# Patient Record
Sex: Male | Born: 1940 | Race: White | Hispanic: No
Health system: Southern US, Community
[De-identification: ages and names within clinical notes are randomized; demographics above are authoritative.]

## PROBLEM LIST (undated history)

## (undated) ENCOUNTER — Emergency Department (HOSPITAL_COMMUNITY): Payer: Medicare Other

## (undated) DIAGNOSIS — D649 Anemia, unspecified: Secondary | ICD-10-CM

## (undated) DIAGNOSIS — K759 Inflammatory liver disease, unspecified: Secondary | ICD-10-CM

## (undated) DIAGNOSIS — Z923 Personal history of irradiation: Secondary | ICD-10-CM

## (undated) DIAGNOSIS — J189 Pneumonia, unspecified organism: Secondary | ICD-10-CM

## (undated) DIAGNOSIS — C419 Malignant neoplasm of bone and articular cartilage, unspecified: Secondary | ICD-10-CM

## (undated) DIAGNOSIS — I2510 Atherosclerotic heart disease of native coronary artery without angina pectoris: Secondary | ICD-10-CM

## (undated) DIAGNOSIS — N289 Disorder of kidney and ureter, unspecified: Secondary | ICD-10-CM

## (undated) DIAGNOSIS — I10 Essential (primary) hypertension: Secondary | ICD-10-CM

## (undated) DIAGNOSIS — C801 Malignant (primary) neoplasm, unspecified: Secondary | ICD-10-CM

## (undated) DIAGNOSIS — I447 Left bundle-branch block, unspecified: Secondary | ICD-10-CM

## (undated) DIAGNOSIS — T8859XA Other complications of anesthesia, initial encounter: Secondary | ICD-10-CM

## (undated) DIAGNOSIS — E7800 Pure hypercholesterolemia, unspecified: Secondary | ICD-10-CM

## (undated) DIAGNOSIS — I4891 Unspecified atrial fibrillation: Secondary | ICD-10-CM

## (undated) DIAGNOSIS — IMO0001 Reserved for inherently not codable concepts without codable children: Secondary | ICD-10-CM

## (undated) DIAGNOSIS — I509 Heart failure, unspecified: Secondary | ICD-10-CM

## (undated) DIAGNOSIS — I480 Paroxysmal atrial fibrillation: Secondary | ICD-10-CM

## (undated) DIAGNOSIS — G4730 Sleep apnea, unspecified: Secondary | ICD-10-CM

## (undated) DIAGNOSIS — T4145XA Adverse effect of unspecified anesthetic, initial encounter: Secondary | ICD-10-CM

## (undated) DIAGNOSIS — J449 Chronic obstructive pulmonary disease, unspecified: Secondary | ICD-10-CM

## (undated) HISTORY — PX: OTHER SURGICAL HISTORY: SHX169

## (undated) HISTORY — PX: CORONARY ANGIOPLASTY WITH STENT PLACEMENT: SHX49

## (undated) HISTORY — PX: BACK SURGERY: SHX140

---

## 2007-04-26 ENCOUNTER — Inpatient Hospital Stay (HOSPITAL_COMMUNITY): Admission: EM | Admit: 2007-04-26 | Discharge: 2007-04-30 | Payer: Self-pay | Admitting: Emergency Medicine

## 2007-04-26 ENCOUNTER — Ambulatory Visit: Payer: Self-pay | Admitting: Internal Medicine

## 2007-12-29 ENCOUNTER — Ambulatory Visit (HOSPITAL_COMMUNITY): Admission: RE | Admit: 2007-12-29 | Discharge: 2007-12-29 | Payer: Self-pay | Admitting: Family Medicine

## 2008-03-22 ENCOUNTER — Emergency Department (HOSPITAL_COMMUNITY): Admission: EM | Admit: 2008-03-22 | Discharge: 2008-03-23 | Payer: Self-pay | Admitting: Emergency Medicine

## 2008-03-27 ENCOUNTER — Emergency Department (HOSPITAL_COMMUNITY): Admission: EM | Admit: 2008-03-27 | Discharge: 2008-03-27 | Payer: Self-pay | Admitting: Emergency Medicine

## 2008-04-16 ENCOUNTER — Inpatient Hospital Stay (HOSPITAL_COMMUNITY): Admission: EM | Admit: 2008-04-16 | Discharge: 2008-04-23 | Payer: Self-pay | Admitting: Emergency Medicine

## 2008-04-29 ENCOUNTER — Ambulatory Visit (HOSPITAL_COMMUNITY): Admission: RE | Admit: 2008-04-29 | Discharge: 2008-04-29 | Payer: Self-pay | Admitting: Family Medicine

## 2008-06-10 DIAGNOSIS — C801 Malignant (primary) neoplasm, unspecified: Secondary | ICD-10-CM

## 2008-06-10 HISTORY — PX: NEPHRECTOMY: SHX65

## 2008-06-10 HISTORY — DX: Malignant (primary) neoplasm, unspecified: C80.1

## 2008-09-28 ENCOUNTER — Ambulatory Visit (HOSPITAL_COMMUNITY): Admission: RE | Admit: 2008-09-28 | Discharge: 2008-09-28 | Payer: Self-pay | Admitting: Neurology

## 2008-10-11 ENCOUNTER — Emergency Department (HOSPITAL_COMMUNITY): Admission: EM | Admit: 2008-10-11 | Discharge: 2008-10-11 | Payer: Self-pay | Admitting: Emergency Medicine

## 2010-02-24 ENCOUNTER — Emergency Department (HOSPITAL_COMMUNITY): Admission: EM | Admit: 2010-02-24 | Discharge: 2010-02-24 | Payer: Self-pay | Admitting: Emergency Medicine

## 2010-08-23 LAB — DIFFERENTIAL
Basophils Absolute: 0 10*3/uL (ref 0.0–0.1)
Basophils Relative: 0 % (ref 0–1)
Eosinophils Absolute: 0 10*3/uL (ref 0.0–0.7)
Eosinophils Relative: 0 % (ref 0–5)
Lymphs Abs: 0.8 10*3/uL (ref 0.7–4.0)
Monocytes Absolute: 0.8 10*3/uL (ref 0.1–1.0)
Monocytes Relative: 9 % (ref 3–12)
Neutro Abs: 7.6 10*3/uL (ref 1.7–7.7)
Neutrophils Relative %: 82 % — ABNORMAL HIGH (ref 43–77)

## 2010-08-23 LAB — URINALYSIS, ROUTINE W REFLEX MICROSCOPIC
Glucose, UA: NEGATIVE mg/dL
Ketones, ur: 15 mg/dL — AB
Nitrite: NEGATIVE
Specific Gravity, Urine: >1.03 — ABNORMAL HIGH (ref 1.005–1.030)
Urobilinogen, UA: 1 mg/dL (ref 0.0–1.0)
pH: 5.5 (ref 5.0–8.0)

## 2010-08-23 LAB — COMPREHENSIVE METABOLIC PANEL
ALT: 15 U/L (ref 0–53)
CO2: 26 mEq/L (ref 19–32)
Calcium: 8.7 mg/dL (ref 8.4–10.5)
Creatinine, Ser: 1.75 mg/dL — ABNORMAL HIGH (ref 0.4–1.5)
GFR calc Af Amer: 47 mL/min — ABNORMAL LOW (ref >60)
Glucose, Bld: 209 mg/dL — ABNORMAL HIGH (ref 70–99)
Potassium: 4.4 mEq/L (ref 3.5–5.1)
Total Bilirubin: 1.2 mg/dL (ref 0.3–1.2)

## 2010-08-23 LAB — URINE MICROSCOPIC-ADD ON

## 2010-08-23 LAB — CBC: HCT: 39.5 % (ref 39.0–52.0)

## 2010-09-19 LAB — CREATININE, SERUM: GFR calc Af Amer: 50 mL/min — ABNORMAL LOW (ref >60)

## 2010-10-23 NOTE — Discharge Summary (Signed)
NAMEBRADDEN, [name]                ACCOUNT NO.:  [id number]   MEDICAL RECORD NO.:  [id number]          PATIENT TYPE:  INP   LOCATION:  A208                          FACILITY:  [facility]   PHYSICIAN:  Marcello Moores, MD   DATE OF BIRTH:  September 26, 1940   DATE OF ADMISSION:  04/26/2007  DATE OF DISCHARGE:  LH                               DISCHARGE SUMMARY   UROLOGIST:  Dr. Rito Ehrlich.   DISCHARGE DIAGNOSES:  1. 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  2. Hematuria resolved.  3. Urinary tract infection resolved.  4. Benign prostatic hypertrophy.  5. Hypertension fairly controlled.  6. Diabetes mellitus controlled.  7. History of myocardial infarction status post probably PCI in 2007      at [place], [facility].  8. Hyperlipidemia.  8. Gastroesophageal reflux disease.   HOME MEDICATIONS:  1. Plavix 75 mg p.o. daily.  2. Atenolol 25 mg p.o. b.i.d.  3. Simvastatin 20 mg p.o. at bedtime.  4. Metformin 500 mg p.o. b.i.d.  5. Isosorbide mononitrate 30 mg p.o. b.i.d.  6. Methadone 10 mg p.o. p.r.n.  7. Glimepiride 6 mg p.o. daily.  8. Prilosec 30 mg p.o. daily.  9. Catapres once weekly.  10.Nitroglycerin patch b.i.d.   HOSPITAL COURSE:  He is a 70 year old male patient with the above  multiple medical problems.  He presented with chest pain and blood in  his urine, and he was evaluated by cardiologist for his chest pain and  MI was cleared.  He was cleared also for surgery and his hematuria  subsided, but CAT scan of the abdomen and pelvic revealed large probably  renal cell carcinoma on the right kidney and he was evaluated by  urologist, Dr. Rito Ehrlich.  As per the plan of Dr. Rito Ehrlich the patient  will go tomorrow to his office, and he will refer him to [facility] for possible surgery.  The patient agreed and he understood,  and tomorrow he will go to Dr. Chancy Milroy office.  Currently the patient  has not any bleeding, and he has not any chest pain,  no shortness of  breath.  He is stable to go home.   PHYSICAL EXAMINATION:  VITAL SIGNS:  Temperature 96, pulse 87,  respiratory rate 20, blood pressure 150/76, saturation 93% on room air.  HEENT:  Has pink conjunctivae, nonicteric sclera.  NECK:  Supple.  CHEST:  He has good air entry bilaterally.  CVS:  S1, S2 regular.  ABDOMEN:  Soft.  No area of tenderness.  EXTREMITIES:  No pedal edema.  CNS:  He is alert and fairly oriented.   LABORATORY DATA:  On the labs today hemoglobin 13, hematocrit 37.8  stable.   PLAN OF DISCHARGE:  The patient will be discharged today to follow up  tomorrow morning with Dr. Rito Ehrlich, urologist, to be referred to [facility] for further evaluation and possible surgery.  Echocardiogram  was done today, and it will be followed by the patient tomorrow morning  who will call to radiology for the results and he can discuss with Dr.  Rito Ehrlich also  about the results.      Marcello Moores, MD  Electronically Signed     MT/MEDQ  D:  04/30/2007  T:  05/01/2007  Job:  [id number]

## 2010-10-23 NOTE — H&P (Signed)
[name], [name]                ACCOUNT NO.:  [id number]   MEDICAL RECORD NO.:  [id number]          PATIENT TYPE:  INP   LOCATION:  IC06                          FACILITY:  [facility]   PHYSICIAN:  Edward L. Juanetta Gosling, M.D.DATE OF BIRTH:  02-Sep-1940   DATE OF ADMISSION:  04/16/2008  DATE OF DISCHARGE:  LH                              HISTORY & PHYSICAL   REASON FOR ADMISSION:  Hyperglycemia and acute renal failure.   HISTORY:  [name] is a 70 year old who came to the emergency because  of high blood sugar and near syncope.  He said that he was in his usual  state of fair health at home, but over the last month he felt bad with  fatigue, nausea, vomiting.  He says that his blood sugar has been high.  He has had trouble getting it under control and today he went to eat  something and almost passed out.   PAST MEDICAL HISTORY:  Positive for hypertension, diabetes and  myocardial infarction.   HOME MEDICATIONS:  1. Plavix 75 mg daily.  2. Atenolol 25 mg b.i.d.  3. Simvastatin 10 mg daily.  4. Metformin 500 mg b.i.d.  5. Isosorbide 30 mg b.i.d.  6. Methadone 10 mg daily.  7. Amaryl 2 mg 3 tablets daily.  8. Prilosec 20 mg daily.  9. Catapres-TTS 1 once a week.  10.Actos plus metformin 15/500 b.i.d.   ALLERGIES:  He is allergic to CODEINE.   SURGICAL HISTORY:  He has apparently had multiple percutaneous coronary  interventions in the past.   SOCIAL HISTORY:  He is a nonsmoker lifetime and does not use any drugs.  Apparently, previous history of alcohol use but stopped about 15 years  ago.   FAMILY HISTORY:  Positive for coronary artery disease, the extent of  which is not totally clear.   PHYSICAL EXAMINATION:  GENERAL:  He appears significantly weak.  VITAL SIGNS:  Blood pressure 92/64, pulse 85, respirations 18, and  temperature is 96.9.  HEENT:  His pupils are reactive.  His mucous membranes are dry.  His  nose and throat are clear.  NECK:  Supple without masses.  LUNGS:  I do not hear any wheezing.  HEART:  He does not have a gallop.  EXTREMITIES:  No edema.  CENTRAL NERVOUS SYSTEM:  Grossly intact.   LABORATORY DATA:  EKG shows a left bundle-branch block, left axis  deviation.  His sodium is 126, his BUN is 51, and creatinine 2.31.  Urine shows white cells, red cells and bacteria.  His white count 7200,  hemoglobin 16.4, platelets 243, and blood sugar 713.  The chest x-ray is  pending.   ASSESSMENT:  He has markedly elevated blood sugar.  He has what appears  to be renal failure.  BUN was 14 and creatinine 1.27 in his last  admission in November 2008.   PLAN:  I am going to go ahead and put him on the ICU due to  hypoglycemia, given some IV fluids and see what his renal function does.  We talked about going to get blood cultures, urine  culture, etc.  He has  not had a chest x-ray and he needs to have that.  He does not have any  evidence of myocardial infarction.  He does have a history of coronary  artery disease.  His EKG though shows left bundle-branch block so we  need to go ahead and check cardiac panel, etc.  I spent time talking to  them      Edward L. Juanetta Gosling, M.D.  Electronically Signed     ELH/MEDQ  D:  04/16/2008  T:  04/16/2008  Job:  [id number]

## 2010-10-23 NOTE — Discharge Summary (Signed)
[name], WYATT                ACCOUNT NO.:  [id number]   MEDICAL RECORD NO.:  [id number]          PATIENT TYPE:  INP   LOCATION:  A328                          FACILITY:  [facility]   PHYSICIAN:  Melvyn Novas, MDDATE OF BIRTH:  Jun 14, 1940   DATE OF ADMISSION:  04/16/2008  DATE OF DISCHARGE:  LH                               DISCHARGE SUMMARY   The patient is a 70 year old white male with history of coronary artery  bypass grafting, hypertension, hyperlipidemia, some learning  difficulties.  He is totally illiterate, also with diabetes and some  chronic pain issues.  The patient was admitted to the hospital with  polyuria and polydipsia, glucose in the 700s admitted to the ICU with  nonketotic hyperosmolar state, insulins and fluids were administered to  normalize his glycemic status.  He did well.  There was no acidosis.  There was no anginal chest pain, orthopnea, or PND.  He was instructed  on how to administer Lantus insulin at night alone, the patient seemed  to understand this and did well, and also glucometer readings, self-  administered.   DISCHARGE MEDICATIONS:  1. Lantus 40 mg h.s. daily.  2. Plavix 75 mg daily.  3. Atenolol 25 mg p.o. daily.  4. Zocor 10 mg per day.  5. Actos plus metformin 50/500 p.o. b.i.d.  6. Catapres-TTS 1 patch weekly.  7. Prilosec 20 mg per day.   He will follow up in the office in 4 days' time to ascertain that he is  handling insulin administration.      Melvyn Novas, MD  Electronically Signed     RMD/MEDQ  D:  04/22/2008  T:  04/23/2008  Job:  [id number]

## 2010-10-23 NOTE — H&P (Signed)
[name], [name]                ACCOUNT NO.:  [id number]   MEDICAL RECORD NO.:  [id number]          PATIENT TYPE:  INP   LOCATION:  A208                          FACILITY:  [facility]   PHYSICIAN:  Dorris Singh, DO    DATE OF BIRTH:  Jun 01, 1941   DATE OF ADMISSION:  04/26/2007  DATE OF DISCHARGE:  LH                              HISTORY & PHYSICAL   The patient is a 70 year old Caucasian male who presented to the  emergency room with Chief Complaint of chest pain and blood in his  urine.  He said he went to a farmer's market today and was having a hard  time urinating but also noted that it had changed in color x1 day's  duration and then noticed he was having some chest discomfort that  radiated to his neck and left arm.  He then came to the emergency room  to be evaluated.   PAST MEDICAL HISTORY:  Significant for:  1. Hypertension.  2. Diabetes.  3. Myocardial infarction.   PRIMARY CARE PHYSICIAN:  Unable to find that information at this point  in time, but will obtain it.   He is a nonsmoker, no drug abuse, former drinker.   ALLERGIES:  No known drug allergies.   CURRENT MEDICATIONS:  He is currently on several medications which  include:  1. Plavix 75 mg once a day.  2. Atenolol 25 mg twice a day.  3. Simvastatin 10 mg once a day.  4. Metformin 500 mg twice a day.  5. Isosorbide mononitrate 30 mg twice a day.  6. Methadone 10 mg as needed.  7. Prilosec once a day.  8. Catapres 0.2 subcutaneously patch once a week.  9. Glimepiride 6 mg daily.   REVIEW OF SYSTEMS:  GENERAL:  This is positive for weakness but negative  for fatigue or changes in weight.  Eyes, ears, nose, and throat are  negative for any changes.  CARDIOVASCULAR:  Positive for chest pain.  RESPIRATORY:  Negative for dyspnea or wheezing.  GASTROINTESTINAL:  Negative for nausea or vomiting, constipation, diarrhea.  GU:  Positive  for hematuria.  MUSCULOSKELETAL:  The patient states he has +1 body  sensation in right groin since last catheterization.  SKIN:  No pruritus  or ecchymosis.  There are no excoriations.  NEUROLOGIC:  No concerns.  Noncontributory.   PHYSICAL EXAMINATION:  VITAL SIGNS: Blood pressure 138/77, pulse 82,  respirations 18, pulse oximetry 97%.  GENERAL:  The patient is a 70 year old Caucasian male, well-developed,  well-nourished,  no acute distress.  HEENT:  Head is normocephalic and atraumatic.  Eyes are normal  appearance.  EOMI.  No scleral icterus or conjunctival injection.  Ears:  TMs visualized bilaterally.  Nose and throat have no erythema or exudate  noted.  NECK:  Supple.  No lymphadenopathy noted.  CARDIOVASCULAR:  Regular rate and rhythm.  No murmurs, rubs, or gallops.  LUNGS:  Clear to auscultation bilaterally.  No wheezes, rales,  rhonchi.  ABDOMEN:  Soft, nontender, round.  No rebound or rigidity noted.  EXTREMITIES:  Positive for full range of motion.  No tenderness, edema,  or ecchymosis noted.  SKIN:  Good turgor, good texture.  NEUROLOGIC:  Cranial nerves II-XII grossly intact.   Tests that were done:  One, she had a sublingual nitroglycerin while in  the ED and actually resolved.   EKG done with rate of 85, left axis deviation, left bundle branch block,  nonspecific ST and T wave changes.  There was no previous EKG for  comparison.   The patient also had a urine microscopic evaluation finding bacteria  many.  On macroscopic, he had positive nitrite, very low blood and  bilirubin, color brown,  appearance hazy.  Cardiac markers were  negative. BMP was negative except for elevated glucose of 125. CBC with  differential:  White count was 6.7, hemoglobin 14.6, hematocrit 42.0,  platelet count 212.   ASSESSMENT AND PLAN:  The patient was admitted with chest pain, urinary  tract infection, and hematuria.  He will be admitted to 2A.  We will  draw serial cardiac markers as well as CBC and BMP.  He will be placed  on a cardiac diet.  Will  also get an EKG in the morning and have [facility]  Cardiology consult and participate.  Will put the patient on home  medications and also DVT and GI prophylaxis.   For his urinary tract infection, will place the patient on IV  antibiotics as well as get a CT done to rule out kidney stones.  This is  a note to the CT, we were called by the radiologist stating that the  patient has a 9 cm renal mass on the right inferior pole of the kidney.  Also, we will get consult with urology as well as Dr. Mariel Sleet.  Will  hold anticoagulation at this point of time.  Explained extensively to  patient that this could mean a renal cancer.  We will continue to do  further investigations to rule this out.  He stated understanding of the  process, and maybe we will have someone come and talk with him regarding  possible biopsy to determine what exactly this is.  We will continue to  follow and monitor patient closely.      Dorris Singh, DO  Electronically Signed     CB/MEDQ  D:  04/26/2007  T:  04/26/2007  Job:  [id number]

## 2010-10-23 NOTE — Group Therapy Note (Signed)
[name], [name]                ACCOUNT NO.:  [id number]   MEDICAL RECORD NO.:  [id number]          PATIENT TYPE:  INP   LOCATION:  A208                          FACILITY:  [facility]   PHYSICIAN:  Dorris Singh, DO    DATE OF BIRTH:  01-02-1941   DATE OF PROCEDURE:  04/28/2007  DATE OF DISCHARGE:                                 PROGRESS NOTE   Patient seen today resting comfortably in bed.  Still having some issues  with urination; however, he is scheduled for a cystoscopy today.  Patient denies any other episodes of chest pain but is still having  difficulty sleeping.   PHYSICAL EXAMINATION:  VITALS:  He is afebrile.  His blood pressure is  110/61.  GENERAL:  This is a 70 year old male who is well-developed and well-  nourished in no acute distress.  HEART:  Regular rate and rhythm.  No murmurs, rubs or gallops.  LUNGS:  Clear to auscultation bilaterally.  ABDOMEN:  Round with some right-sided tenderness, upper and lower  quadrants.  Positive pulses in all four extremities.  He has TED hose on his lower  extremities.   His labs for today are as follows:  They are currently pending.  He will  also have labs for today as well as labs for tomorrow.  The plan today  is for a cystoscopy.   ASSESSMENT:  1. Right renal neoplasm.  2. Chest pain.  3. Hematuria.   PLAN:  Cystoscopy today and then any other recommendations Dr. Rito Ehrlich  may have for him.  I feel that most of his testing will need to be done  outpatient.  He will need an oncologist on discharge as well as seeing  Dr. Rito Ehrlich on discharge.  His chest pain has not recurred, and this  actually may be due to his current cancer situation, but he will need a  complete workup once his cystoscopy is completed.  Anticipate discharge  in 1-2 days.      Dorris Singh, DO  Electronically Signed     CB/MEDQ  D:  04/28/2007  T:  04/28/2007  Job:  [id number]

## 2010-10-23 NOTE — Procedures (Signed)
[name], [name]                ACCOUNT NO.:  [id number]   MEDICAL RECORD NO.:  [id number]          PATIENT TYPE:  INP   LOCATION:  A208                          FACILITY:  [facility]   PHYSICIAN:  Gerrit Friends. Dietrich Pates, MD, FACCDATE OF BIRTH:  1940/10/07   DATE OF PROCEDURE:  04/30/2007  DATE OF DISCHARGE:  04/30/2007                                ECHOCARDIOGRAM   REFERRING PHYSICIANS:  Elige Radon and Gerrit Friends. Dietrich Pates, MD, [facility]   CLINICAL DATA:  A 70 year old gentleman with prior CABG surgery;  impaired left ventricular systolic function on a recent stress nuclear  study.  Aorta 4, left atrium 4.1, septum 1.7, posterior wall 1.5, LV  diastole 4.8, LV systole 3.3.   1. Technically difficult and somewhat limited echocardiographic study.  2. Very mild left atrial enlargement; normal right atrium and right      ventricle.  3. Normal aortic valve; mild dilatation of the proximal ascending      aorta with mild calcification of the wall and annulus.  4. Normal mitral valve; mild annular calcification.  5. Suboptimal imaging of the tricuspid and pulmonic valves, both of      which appear normal.  Normal proximal pulmonary artery.  6. Normal left ventricular size; moderate hypertrophy; not all      segments adequately imaged.  There is moderate hypokinesis of the      basilar anteroseptal region.  The apex appears similarly      hypokinetic.  Contractility of the inferior and posterior segments      is normal.  Overall left ventricular systolic function appears to      be mildly impaired.  Estimated ejection fraction is .45.      Gerrit Friends. Dietrich Pates, MD, [facility]  Electronically Signed     RMR/MEDQ  D:  04/30/2007  T:  05/01/2007  Job:  [id number]

## 2010-10-23 NOTE — Consult Note (Signed)
[name], [name]                ACCOUNT NO.:  [id number]   MEDICAL RECORD NO.:  [id number]          PATIENT TYPE:  INP   LOCATION:  A208                          FACILITY:  [facility]   PHYSICIAN:  Dennie Maizes, M.D.   DATE OF BIRTH:  07-28-1940   DATE OF CONSULTATION:  04/27/2007  DATE OF DISCHARGE:                                 CONSULTATION   CONSULTING PHYSICIAN:  Dennie Maizes, M.D., urologist.   REASON FOR CONSULTATION:  Hematuria, right renal mass, urinary tract  infection.   HISTORY OF PRESENT ILLNESS:  This 70 year old male has been having  prostatic obstructive symptoms for several years. A slow urinary stream,  urinary frequency x4-5, nocturia x4-5. He was doing well until a few  days ago. Started having hematuria and voiding difficulty. He also had  atypical chest pain. He came to the emergency room at [facility]. Urinalysis revealed a blood and evidence infection. The  patient has been started on Levaquin. Urine culture and sensitivity is  still pending. Further evaluation has been done with a noncontrast CT  scan of the abdomen and pelvis. This will reveal a large solid right  lower pole renal mass consistent with renal cell carcinoma. I was asked  to see this patient for further evaluation and management.   The patient denied having any flank pain, voiding difficulty or  hematuria in the past. No past history of urolithiasis.   PAST MEDICAL HISTORY:  History of coronary artery disease status post  coronary angioplasty in the past, history of type 2 diabetes mellitus,  status post MI, history of degenerative disk disease of the spine,  status post back surgery.   MEDICATIONS:  1. Plavix 75 mg 1 p.o. daily.  2. Atenolol 35 mg twice a day.  3. Simvastatin 10 mg 1 p.o. daily.  4. Metformin 5 mg twice a day.  5. Isosorbide 30 mg twice a day.  6. Methadone 10 mg as needed.  7. Prilosec once a day.  8. Catapres 0.2 mg patch once a week.  9. Glimepiride 6  mg p.o. daily.   ALLERGIES:  None.   PHYSICAL EXAMINATION:  Abdomen is Soft, no palpable frank mass or  costovertebral angle tenderness. Bladder not palpable. Penis and testes  are normal.  Rectal examination large benign prostate about 50 grams in size.   ADMISSION LABS:  Hemoglobin 14.8, hematocrit 42.5, BUN 14, creatinine  1.27. CBC, WBC 9.6, hemoglobin 14.7, hematocrit 42.3. PTT is 28. PT is  13. Urine culture and sensitivity revealed no growth. Urinalysis,  bilirubin large, blood large, nitrite positive, leukocyte esterase  moderate. Microscopic WBC is 20-30 bacteria are many.   A noncontrast CT scan of the abdomen and pelvis revealed right lower  pole solid renal mass measuring 8.9 x 9.4 x 8.1 cm in size. The  appearance is suggestive of renal neoplasm. Also probable blood clots  inside the bladder.   IMPRESSION:  1. Solid right renal mass probably renal cell carcinoma.  2. Hematuria.  3. Urinary tract infection.  4. Benign prostatic hypertrophy with bladder neck obstruction .  PLAN:  1. Await urine culture and sensitivity final report.  2. Continue Levaquin.  3. CT scan of the abdomen and pelvis with contrast for further      evaluation of the solid right renal mass.  4. The patient will need cystoscopy to evaluate the bladder and right      radical nephrectomy for treatment of renal cell carcinoma. Further      recommendations to follow after reading the x-rays.   Thanks for this consult.      Dennie Maizes, M.D.  Electronically Signed     SK/MEDQ  D:  04/27/2007  T:  04/27/2007  Job:  [id number]

## 2010-10-23 NOTE — Group Therapy Note (Signed)
NAMEOTHELLO, DICKENSON                ACCOUNT NO.:  [id number]   MEDICAL RECORD NO.:  [id number]          PATIENT TYPE:  INP   LOCATION:  A208                          FACILITY:  [facility]   PHYSICIAN:  Dorris Singh, DO    DATE OF BIRTH:  09/29/1940   DATE OF PROCEDURE:  04/27/2007  DATE OF DISCHARGE:                                 PROGRESS NOTE   The patient seen today resting comfortably in bed.  He was seen by Dr.  Rito Ehrlich regarding renal mass.  We will plan to do a cystoscopy to help  with his current hematuria.  Also, the patient was seen by cardiology  and cardiology will go ahead and follow him if he needs surgical  clearance while he is here.  Currently, the patient has all  anticoagulants held due to gross and continue hematuria.   VITAL SIGNS:  He is afebrile.  Blood pressure is 136/74.   EXAM:  GENERAL:  This is a 71 year old Caucasian male who is well-  developed, well-nourished in no acute distress.  HEART:  Regular rate and rhythm.  S1, S2 present.  LUNGS:  Clear to auscultation bilaterally.  No wheeze, rales, or  rhonchi.  ABDOMEN:  Round, large, positive right upper and lower quadrant  tenderness.  Positive pulses.  Currently, the patient has T.E.D. hose on.   LABORATORY STUDIES:  The patient had a hemoglobin and hematocrit done,  14.2 and 41.2.  His CBC was within normal limits today.  Also, he had a  CMP done.  Everything was within normal limits, except for his glucose  and his cardiac markers were within normal limits.  His cholesterol is  118 and his HDL is 36, his LDL is 72.   ASSESSMENT AND PLAN:  1. Renal mass.  2. Hematuria.  3. Chest pain.   PLAN:  1. Renal mass.  We will go ahead and Dr. Rito Ehrlich has made some      recommendations.  I also have Dr. Mariel Sleet consulted on the case.      He may need to follow outpatient, but we will see if he sees him      while he is an inpatient here.  2. Hematuria.  Dr. Rito Ehrlich will prepare the patient for a  cystoscopy      in-house.  3. Chest pain.  Dr. Tenny Craw has seen the patient and states that she will      go ahead and follow him if anything is needed.  She has stated that      chest pain is atypical, and all of his markers are normal, but we      will continue to follow him for any cardiac clearance that is      needed.      Dorris Singh, DO  Electronically Signed     CB/MEDQ  D:  04/27/2007  T:  04/27/2007  Job:  [id number]

## 2010-10-23 NOTE — Consult Note (Signed)
[name], [name]                ACCOUNT NO.:  [id number]   MEDICAL RECORD NO.:  [id number]          PATIENT TYPE:  INP   LOCATION:  A208                          FACILITY:  [facility]   PHYSICIAN:  Pricilla Riffle, MD, FACCDATE OF BIRTH:  1941-05-04   DATE OF CONSULTATION:  04/27/2007  DATE OF DISCHARGE:                                 CONSULTATION   REASON FOR REFERRAL:  Chest pain.   REFERRING PHYSICIAN:  Dorris Singh, MD, [facility]  [facility].   PRIMARY CARDIOLOGIST:  Elam Dutch, MD, [place], [place] at  [facility] [place], [place].   PRIMARY CARE PHYSICIAN:  Arlina Robes, MD, [place], [place].   HISTORY OF PRESENT ILLNESS:  [name] is a 70 year old male patient  with a history of coronary disease status post multiple percutaneous  coronary interventions in the past in [place] and [place], [place],  whom we are asked to further evaluate for chest pain.  The patient was  in his usual state of health yesterday [place] when he  developed dysuria and gross hematuria.  He became concerned with this  and also developed left-sided chest discomfort.  He came to the  emergency room.  Since hospitalized, he has had a CT scan which  confirmed a right renal mass which is quite large.  It measures 8.9 x  8.5 x 8.1 cm.  Workup of this finding is currently pending.   The patient notes that he has a history of chronic chest pain.  He had  multiple percutaneous coronary interventions in the past.  His last  cardiac catheterization was probably in 2007 in [place], [place].  We  do not have the records yet.  The patient notes that he used to have  fairly chronic chest pain, either with exertion or at rest.  His  cardiologist in [place] has adjusted his medications several times, and  he is currently stable.  Prior to this hospitalization, he had been  doing well without any chest pain or shortness of breath.  He denies any  exertional chest symptoms.  He admits to Class II symptoms.   Yesterday when he developed gross hematuria, he developed left-sided  chest discomfort.  There was no radiation.  There was no associated  shortness of breath.  He did become somewhat nauseated but thinks this  was secondary to fear over the hematuria.  He denies any palpitations,  syncope, near syncope.  He denies any diaphoresis.   PAST MEDICAL HISTORY:  1. Coronary disease.      a.     Records pending.      b.     History of remote myocardial infarction in the past.      c.     Multiple percutaneous coronary interventions in the past       (last PCI probably in 2007 in [place], [place], at [facility]).      d.     Chronic chest pain, symptoms quiescent on medical therapy.  2. Hypertension.  3. Diabetes mellitus.  4.  Hyperlipidemia.  5. GERD.  6. Lumbar degenerative disk disease.      a.     Chronic low back pain.      b.     History of back surgery in the past.  7. History of heat injury  8. History of traumatic amputation of left ring finger.   MEDICATIONS AT HOME:  1. Plavix 75 mg daily.  2. Atenolol 25 mg b.i.d.  3. Simvastatin 10 mg nightly.  4. Metformin 500 mg b.i.d.  5. Isosorbide mononitrate 30 mg b.i.d.  6. Methadone 10 mg p.r.n.  7. Glimepiride 6 mg daily.  8. Prilosec daily.  9. Catapres-TTS  one weekly.  10.Nitroglycerin patch b.i.d.   CURRENT MEDICATIONS:  1. Lovenox 40 mg subcutaneously daily,  2. Atenolol to 5 mg b.i.d.  3. Simvastatin 10 mg daily.  4. Metformin 500 mg b.i.d.  5. Protonix 40 mg daily.  6. Aspirin 325 mg daily.  7. Glimepiride 6 mg daily.  8. Catapres-TTS 1 every 7 days.  9. Levaquin 500 mg IV daily.   ALLERGIES:  No known drug allergies.   SOCIAL HISTORY:  The patient lives in [place]. He is divorced, has  two estranged children.  He is on disability secondary to coronary  disease.  He quit drinking alcohol 15 years ago.  He has  never smoked  cigarettes and denies any drug abuse.   FAMILY HISTORY:  Significant for coronary disease.   REVIEW OF SYSTEMS:  Please see HPI.  He denies any fevers, chills,  headache.  Denies any rashes.  Denies any melena or hematochezia.  Denies any dysphagia, odynophagia.  Denies any monocular blindness,  unilateral weakness, difficulty with speech, or facial droop.  Denies  any cough or hemoptysis.  Rest of the Review of Systems are negative.   PHYSICAL EXAMINATION:  GENERAL:  This is a well-nourished, well-  developed male in no acute distress.  VITAL SIGNS:  Blood pressure is 162/88, pulse 81, respirations 20  temperature 96.3, oxygen saturation 96% on room air.  HEENT:  Normal.  NECK:  Without JVD or lymphadenopathy.  ENDOCRINE:  Without thyromegaly.  CARDIAC:  Normal S1, S2.  Regular rate and rhythm.  No obvious murmurs.  LUNGS:  With decreased breath sounds bilaterally, dry crackles, no  wheezing, no rhonchi, no rales.  ABDOMEN:  Soft with normoactive bowel sounds. Positive right lower  quadrant and right flank pain with palpation.  EXTREMITIES:  Without clubbing, cyanosis or edema.  MUSCULOSKELETAL:  Without joint deformity.  SKIN:  Warm and dry.  NEUROLOGIC:  He is alert and oriented x3.  Cranial nerves II-XII grossly  intact.  VASCULAR:  Without carotid bruits bilaterally.  Dorsalis pedis and  posterior tibialis pulses 2+ bilaterally.   EKG reveals sinus rhythm with a heart rate of 83, right axis deviation,  left bundle branch block.   Chest x-ray shows cardiac enlargement.  No acute changes.   Abdominal CT, as noted above, shows a large right renal mass measuring  8.9 x 8.4 x 8.1 cm.   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   LABORATORY DATA:  Hemoglobin 14.7, hematocrit 42.2, white count 9600,  platelet count 210,000.  Sodium 137, potassium 3.7, BUN 14, creatinine  1.27, glucose 129.  Point-of-care markers  negative x1.  Regular cardiac  markers:  Total CK 106, CK-MB 2.2, troponin-I 0.03.  Next set:  Total CK  102, CK-MB 1.8, troponin-I of 0.03. INR 1.0.  Calcium 9. Blood culture  and urine culture pending.   IMPRESSION:  1. Right renal mass  2. Atypical chest pain.  3. Coronary artery disease status post multiple percutaneous coronary      interventions in the past.  The records are pending.  4. Diabetes mellitus.  5. Hypertension.  6. Hyperlipidemia.   PLAN:  The patient presents to the hospital with gross hematuria and  findings of right renal mass on CT scan.  He has had some atypical chest  pain and has an extensive history of coronary disease.  Currently his  records are pending.  He has apparently had multiple percutaneous  coronary interventions in the past.  At this point in time, we do not  know what types of stents he has had placed or what his anatomy is.  His  chronic chest pain is currently under control with medical therapy.  We  suspect he will likely need surgery for his mass.  Workup is currently  pending.  We will go ahead and arrange for an adenosine Myoview to  evaluate him for surgical clearance.  We will need to  obtain his records from [place] [facility] [place].  He should remain on  aspirin at the very least at this point in time.  Of note, he is off  Plavix.  We do not think he has had an intervention done since 2007.  Further recommendations to follow once we have his records from his  primary cardiologist as well as results of stress test.      Tereso Newcomer, PA-C      Pricilla Riffle, MD, [facility]  Electronically Signed    SW/MEDQ  D:  04/27/2007  T:  04/27/2007  Job:  [id number]   cc:   Dorris Singh, DO   Elam Dutch, MD  FAX [phone]  Phone  [phone]   [facility] [place]  Fax: [phone]

## 2010-10-28 ENCOUNTER — Emergency Department (HOSPITAL_COMMUNITY): Payer: Medicare Other

## 2010-10-28 ENCOUNTER — Inpatient Hospital Stay (HOSPITAL_COMMUNITY)
Admission: EM | Admit: 2010-10-28 | Discharge: 2010-10-29 | DRG: 310 | Disposition: A | Discharge status: Home / Self Care | Payer: Medicare Other | Attending: Interventional Cardiology | Admitting: Interventional Cardiology

## 2010-10-28 DIAGNOSIS — Z951 Presence of aortocoronary bypass graft: Secondary | ICD-10-CM

## 2010-10-28 DIAGNOSIS — I10 Essential (primary) hypertension: Secondary | ICD-10-CM | POA: Diagnosis present

## 2010-10-28 DIAGNOSIS — Z79899 Other long term (current) drug therapy: Secondary | ICD-10-CM

## 2010-10-28 DIAGNOSIS — Z7902 Long term (current) use of antithrombotics/antiplatelets: Secondary | ICD-10-CM

## 2010-10-28 DIAGNOSIS — Z7982 Long term (current) use of aspirin: Secondary | ICD-10-CM

## 2010-10-28 DIAGNOSIS — I2510 Atherosclerotic heart disease of native coronary artery without angina pectoris: Secondary | ICD-10-CM | POA: Diagnosis present

## 2010-10-28 DIAGNOSIS — E119 Type 2 diabetes mellitus without complications: Secondary | ICD-10-CM | POA: Diagnosis present

## 2010-10-28 DIAGNOSIS — I4891 Unspecified atrial fibrillation: Principal | ICD-10-CM | POA: Diagnosis present

## 2010-10-28 LAB — BASIC METABOLIC PANEL
CO2: 27 mEq/L (ref 19–32)
GFR calc non Af Amer: 49 mL/min — ABNORMAL LOW (ref >60)
Glucose, Bld: 132 mg/dL — ABNORMAL HIGH (ref 70–99)
Potassium: 3.8 mEq/L (ref 3.5–5.1)
Sodium: 141 mEq/L (ref 135–145)

## 2010-10-28 LAB — DIFFERENTIAL
Eosinophils Absolute: 0.4 10*3/uL (ref 0.0–0.7)
Eosinophils Relative: 5 % (ref 0–5)
Lymphs Abs: 1.9 10*3/uL (ref 0.7–4.0)
Monocytes Absolute: 1 10*3/uL (ref 0.1–1.0)
Monocytes Relative: 14 % — ABNORMAL HIGH (ref 3–12)

## 2010-10-28 LAB — CBC
MCH: 27.6 pg (ref 26.0–34.0)
MCHC: 34.3 g/dL (ref 30.0–36.0)
MCV: 80.4 fL (ref 78.0–100.0)
Platelets: 212 10*3/uL (ref 150–400)
RDW: 14 % (ref 11.5–15.5)

## 2010-10-28 LAB — POCT CARDIAC MARKERS: CKMB, poc: <1 ng/mL — ABNORMAL LOW (ref 1.0–8.0)

## 2010-10-28 LAB — CARDIAC PANEL(CRET KIN+CKTOT+MB+TROPI): Relative Index: INVALID (ref 0.0–2.5)

## 2010-10-28 LAB — GLUCOSE, CAPILLARY
Glucose-Capillary: 176 mg/dL — ABNORMAL HIGH (ref 70–99)
Glucose-Capillary: 181 mg/dL — ABNORMAL HIGH (ref 70–99)

## 2010-10-28 LAB — CK TOTAL AND CKMB (NOT AT ARMC)
Relative Index: INVALID (ref 0.0–2.5)
Total CK: 60 U/L (ref 7–232)

## 2010-10-29 LAB — CARDIAC PANEL(CRET KIN+CKTOT+MB+TROPI)
CK, MB: 1.8 ng/mL (ref 0.3–4.0)
Troponin I: <0.3 ng/mL (ref <0.30)

## 2010-10-29 LAB — HEPARIN LEVEL (UNFRACTIONATED): Heparin Unfractionated: 0.54 IU/mL (ref 0.30–0.70)

## 2010-10-29 LAB — CBC
HCT: 39.7 % (ref 39.0–52.0)
Hemoglobin: 13.1 g/dL (ref 13.0–17.0)
MCH: 26.6 pg (ref 26.0–34.0)
MCHC: 33 g/dL (ref 30.0–36.0)
MCV: 80.7 fL (ref 78.0–100.0)

## 2010-11-15 NOTE — H&P (Signed)
NAMEJEREMY, [name]                ACCOUNT NO.:  [id number]  MEDICAL RECORD NO.:  [id number]           PATIENT TYPE:  I  LOCATION:  3713                         FACILITY:  [facility]  PHYSICIAN:  Corky Crafts, MDDATE OF BIRTH:  08-Sep-1940  DATE OF ADMISSION:  10/28/2010 DATE OF DISCHARGE:                             HISTORY & PHYSICAL   PRIMARY CARE PHYSICIAN:  Bobby Novas, MD, in [place].  REASON FOR ADMISSION:  Palpitations.  HISTORY OF PRESENT ILLNESS:  The patient is 70 year old with known coronary artery disease who had bypass surgery.  According to the previous chart, he had palpitations intermittently for the past week. Today, they were persistent and quite severe, so he came to the emergency room.  He was found to be in atrial fibrillation with rapid ventricular response.  He was initially given adenosine with no change. Subsequently, he was given Cardizem with some slowing of his heart rate. He did not have any syncope.  He did have some chest discomfort which resolved and his heart rate came down.  Currently, he feels well.  While in the emergency room, he was given 5 of IV Lopressor and he converted to normal sinus rhythm.  He does routinely take aspirin and Plavix.  He has had some visual issues and was recently evaluated at [facility].  HOME MEDICINES: 1. Plavix 75 mg daily. 2. Atenolol 50 mg daily. 3. Cetirizine 10 mg daily. 4. Actoplus 15/500 one tablet b.i.d. 5. Isosorbide mononitrate 30 mg p.o. daily. 6. Aspirin 81 mg p.o. daily. 7. Hydrochlorothiazide 12.5 mg daily. 8. Lisinopril 40 mg daily.  PAST MEDICAL HISTORY: 1. Hypertension. 2. Diabetes. 3. Hepatitis.  ALLERGIES:  No known drug allergies.  SOCIAL HISTORY:  No smoking.  He quit alcohol.  He is retired.  He lives in [place].  PAST SURGICAL HISTORY:  Back surgery, kidney removal, and previous heart catheterization with stent placement.  REVIEW OF SYSTEMS:  Significant for the  palpitations and chest pain.  He has had chronic pain issues in the past.  No swelling.  All other systems negative.  PHYSICAL EXAMINATION:  VITAL SIGNS:  Blood pressure 122/84 and pulse 79. GENERAL:  He is awake and alert. HEAD:  Normocephalic. EYES:  Extraocular movements are intact. NECK:  No JVD. CARDIOVASCULAR:  Regular rate and rhythm.  S1 and S2. LUNGS:  Clear to auscultation bilaterally. ABDOMEN:  Soft and nontender. EXTREMITIES:  No edema.  Hemoglobin 14.8.  INR 1.0.  Creatinine 1.4.  EKG initially showed atrial fibrillation with rapid ventricular response, left bundle-branch block pattern.  Subsequent ECG showed normal sinus rhythm.  Chest x-ray did not show any evidence of prior bypass surgery.  He had cardiomegaly with bibasilar atelectasis.  ASSESSMENT/PLAN:  A 70 year old new onset atrial fibrillation and known coronary artery disease.  PLAN: 1. He is converted to normal sinus rhythm.  We will switch to IV     Cardizem to p.o.  We may have to decrease his lisinopril dose to     avoid hypotension.  We will watch him on telemetry to see if he     maintains sinus rhythm. 2.  Coronary artery disease.  He did have some chest pain.  It may have     been rate related.  We will cycle cardiac enzymes. 3. We will check echocardiogram to evaluate LV function and for any     structural heart disease. 4. In regard to anticoagulation, he has currently a CHADS score of the     least 2.  He is taking long-term aspirin and Plavix.  I do not     known if he has drug-eluting stent or not.  I am not comfortable     with starting him on Coumadin in addition to the aspirin and Plavix     at this time.  He has a followup appointment with his cardiologist     on Thursday.  Hopefully, he will maintain sinus rhythm until then.     We will continue aspirin and Plavix.  For now while he is in the     hospital, we will have him on heparin, but will likely not     discharge him on Coumadin  since he is on dual antiplatelet therapy.     We will leave the decision for future antithrombotic therapy up to     his primary care doctor and regular cardiologist.     Corky Crafts, MD     JSV/MEDQ  D:  10/28/2010  T:  10/29/2010  Job:  [id number]  Electronically Signed by Lance Muss MD on 11/15/2010 12:11:53 PM

## 2010-11-15 NOTE — Discharge Summary (Signed)
  [name], [name]                ACCOUNT NO.:  [id number]  MEDICAL RECORD NO.:  [id number]           PATIENT TYPE:  I  LOCATION:  3713                         FACILITY:  [facility]  PHYSICIAN:  Corky Crafts, MDDATE OF BIRTH:  Oct 05, 1940  DATE OF ADMISSION:  10/28/2010 DATE OF DISCHARGE:  10/29/2010                              DISCHARGE SUMMARY   FINAL DIAGNOSES: 1. Paroxysmal atrial fibrillation. 2. Coronary artery disease. 3. Sleep apnea. 4. Hypertension. 5. Diabetes.  PROCEDURES PERFORMED:  Echocardiogram showing normal left ventricular function and no significant valvular abnormalities.  No evidence of intracardiac thrombus.  HOSPITAL COURSE:  The patient was admitted with palpitations.  He was found to be in atrial fibrillation with rapid ventricular response.  He was given IV Cardizem, which slowed his rate down, but he remained in atrial fibrillation.  After a dose of IV metoprolol, he converted to normal sinus rhythm and maintained sinus rhythm.  He had been on aspirin and Plavix because he has had significant coronary artery disease with stents placed.  Apparently, his cardiologist is in [place], [place]. We did not have any record of what type stent had been placed.  He had never been on Coumadin before.  Given the fact that he was on aspirin and Plavix chronically, I was hesitant to start him on Coumadin despite the fact that his CHADS score is increased.  He has been stable on the aspirin and Plavix.  The episode of atrial fibrillation that he had was probably a few hours.  He was started on Cardizem and continued on his other home medicines.  DISCHARGE MEDICATIONS: 1. Tylenol 650 mg p.o. q.4 h. p.r.n. 2. Cardizem 120 mg p.o. daily. 3. Lisinopril 20 mg daily. 4. Actoplus 15/500 mg p.o. b.i.d. 5. Aspirin 81 mg daily. 6. Atenolol 50 mg daily. 7. Cetirizine 10 mg daily. 8. Hydrochlorothiazide 12.5 mg daily. 9. Isosorbide mononitrate 30 mg  daily. 10.Lantus 40 units subcu nightly. 11.Plavix 75 mg daily.  FOLLOWUP INSTRUCTIONS:  He is meeting with his cardiologist tomorrow.  I have given him a handwritten list of his diagnoses to take to his cardiologist.  DIET:  Heart-healthy, low-salt diet.  ACTIVITY:  Increase activity as tolerated.  He needs to continue using his CPAP as well hospital.  LABORATORY DATA:  Ruled out for MI.     Corky Crafts, MD     JSV/MEDQ  D:  10/31/2010  T:  11/01/2010  Job:  [id number]  Electronically Signed by Lance Muss MD on 11/15/2010 12:11:57 PM

## 2011-02-08 ENCOUNTER — Emergency Department (HOSPITAL_COMMUNITY)
Admission: EM | Admit: 2011-02-08 | Discharge: 2011-02-08 | Disposition: A | Discharge status: Home / Self Care | Payer: Medicare Other | Attending: Emergency Medicine | Admitting: Emergency Medicine

## 2011-02-08 ENCOUNTER — Emergency Department (HOSPITAL_COMMUNITY): Payer: Medicare Other

## 2011-02-08 DIAGNOSIS — I10 Essential (primary) hypertension: Secondary | ICD-10-CM | POA: Insufficient documentation

## 2011-02-08 DIAGNOSIS — E119 Type 2 diabetes mellitus without complications: Secondary | ICD-10-CM | POA: Insufficient documentation

## 2011-02-08 DIAGNOSIS — I2510 Atherosclerotic heart disease of native coronary artery without angina pectoris: Secondary | ICD-10-CM | POA: Insufficient documentation

## 2011-02-08 DIAGNOSIS — Z87891 Personal history of nicotine dependence: Secondary | ICD-10-CM | POA: Insufficient documentation

## 2011-02-08 DIAGNOSIS — J189 Pneumonia, unspecified organism: Secondary | ICD-10-CM | POA: Insufficient documentation

## 2011-02-08 HISTORY — DX: Atherosclerotic heart disease of native coronary artery without angina pectoris: I25.10

## 2011-02-08 HISTORY — DX: Essential (primary) hypertension: I10

## 2011-02-08 LAB — COMPREHENSIVE METABOLIC PANEL
ALT: 15 U/L (ref 0–53)
Calcium: 9 mg/dL (ref 8.4–10.5)
Creatinine, Ser: 1.34 mg/dL (ref 0.50–1.35)
GFR calc Af Amer: >60 mL/min (ref >60)
GFR calc non Af Amer: 53 mL/min — ABNORMAL LOW (ref >60)
Glucose, Bld: 118 mg/dL — ABNORMAL HIGH (ref 70–99)
Sodium: 138 mEq/L (ref 135–145)
Total Protein: 6.7 g/dL (ref 6.0–8.3)

## 2011-02-08 LAB — BLOOD GAS, ARTERIAL
FIO2: 0.21 %
Patient temperature: 37
TCO2: 20.7 mmol/L (ref 0–100)
pCO2 arterial: 37.7 mmHg (ref 35.0–45.0)
pH, Arterial: 7.411 (ref 7.350–7.450)

## 2011-02-08 LAB — DIFFERENTIAL
Eosinophils Absolute: 0.1 10*3/uL (ref 0.0–0.7)
Eosinophils Relative: 0 % (ref 0–5)
Lymphs Abs: 1.9 10*3/uL (ref 0.7–4.0)
Monocytes Absolute: 0.8 10*3/uL (ref 0.1–1.0)

## 2011-02-08 LAB — CBC
HCT: 41.4 % (ref 39.0–52.0)
Hemoglobin: 14 g/dL (ref 13.0–17.0)
MCH: 27.7 pg (ref 26.0–34.0)
MCHC: 33.8 g/dL (ref 30.0–36.0)
MCV: 81.8 fL (ref 78.0–100.0)

## 2011-02-08 MED ORDER — SODIUM CHLORIDE 0.9 % IV SOLN
Freq: Once | INTRAVENOUS | Status: AC
  Administered 2011-02-08: 07:00:00 via INTRAVENOUS

## 2011-02-08 MED ORDER — GUAIFENESIN-CODEINE 100-10 MG/5ML PO SOLN
5.0000 mL | Freq: Once | ORAL | Status: AC
  Administered 2011-02-08: 5 mL via ORAL
  Filled 2011-02-08: qty 5

## 2011-02-08 MED ORDER — SODIUM CHLORIDE 0.9 % IV BOLUS (SEPSIS)
500.0000 mL | Freq: Once | INTRAVENOUS | Status: AC
  Administered 2011-02-08: 1000 mL via INTRAVENOUS

## 2011-02-08 MED ORDER — DEXTROSE 5 % IV SOLN
1.0000 g | Freq: Once | INTRAVENOUS | Status: AC
  Administered 2011-02-08: 1 g via INTRAVENOUS
  Filled 2011-02-08: qty 1

## 2011-02-08 MED ORDER — MOXIFLOXACIN HCL 400 MG PO TABS: 400.0000 mg | ORAL_TABLET | Freq: Every day | ORAL | Status: AC

## 2011-02-08 MED ORDER — AZITHROMYCIN 250 MG PO TABS
500.0000 mg | ORAL_TABLET | Freq: Once | ORAL | Status: AC
  Administered 2011-02-08: 500 mg via ORAL
  Filled 2011-02-08: qty 2

## 2011-02-08 MED ORDER — ACETAMINOPHEN 500 MG PO TABS
1000.0000 mg | ORAL_TABLET | Freq: Once | ORAL | Status: AC
  Administered 2011-02-08: 1000 mg via ORAL
  Filled 2011-02-08: qty 2

## 2011-02-08 MED ORDER — ALBUTEROL SULFATE (5 MG/ML) 0.5% IN NEBU
5.0000 mg | INHALATION_SOLUTION | Freq: Once | RESPIRATORY_TRACT | Status: AC
  Administered 2011-02-08: 5 mg via RESPIRATORY_TRACT
  Filled 2011-02-08: qty 1

## 2011-02-08 NOTE — ED Provider Notes (Addendum)
History     CSN: [id number] Arrival date & time: 02/08/2011  5:17 AM  Chief Complaint  Patient presents with  . Cough   Patient is a 70 y.o. male presenting with cough. The history is provided by the patient.  Cough This is a new problem. Episode onset: yesterday. The cough is non-productive. The maximum temperature recorded prior to his arrival was 101 to 101.9 F. Associated symptoms include chills. Pertinent negatives include no headaches, no sore throat, no shortness of breath and no wheezing. His past medical history is significant for pneumonia. His past medical history does not include COPD.    Past Medical History  Diagnosis Date  . Diabetes mellitus   . Coronary artery disease   . Hypertension     History reviewed. No pertinent past surgical history.  History reviewed. No pertinent family history.  History  Substance Use Topics  . Smoking status: Former Games developer  . Smokeless tobacco: Not on file  . Alcohol Use: No      Review of Systems  Constitutional: Positive for chills.  HENT: Negative for sore throat.   Respiratory: Positive for cough. Negative for shortness of breath and wheezing.   Neurological: Negative for headaches.  All other systems reviewed and are negative.    Physical Exam  BP 129/81  Pulse 103  Temp(Src) 102 F (38.9 C) (Oral)  Resp 24  Ht 5\' 7"  (1.702 m)  Wt 252 lb (114.306 kg)  BMI 39.47 kg/m2  SpO2 95%  Physical Exam  Nursing note and vitals reviewed. Constitutional: He is oriented to person, place, and time. He appears well-developed and well-nourished.  HENT:  Head: Normocephalic and atraumatic.  Eyes: EOM are normal. Pupils are equal, round, and reactive to light.  Neck: Normal range of motion.  Cardiovascular: Normal rate, normal heart sounds and intact distal pulses.   Pulmonary/Chest: Effort normal.       Rales in right base  Musculoskeletal: Normal range of motion.  Neurological: He is alert and oriented to person, place,  and time.  Skin: Skin is warm and dry.    ED Course  Procedures  Results for orders placed during the hospital encounter of 02/08/11  CBC      Component Value Range   WBC 11.4 (*) 4.0 - 10.5 (K/uL)   RBC 5.06  4.22 - 5.81 (MIL/uL)   Hemoglobin 14.0  13.0 - 17.0 (g/dL)   HCT 40.9  81.1 - 91.4 (%)   MCV 81.8  78.0 - 100.0 (fL)   MCH 27.7  26.0 - 34.0 (pg)   MCHC 33.8  30.0 - 36.0 (g/dL)   RDW 78.2  95.6 - 21.3 (%)   Platelets 181  150 - 400 (K/uL)  DIFFERENTIAL      Component Value Range   Neutrophils Relative 76  43 - 77 (%)   Neutro Abs 8.6 (*) 1.7 - 7.7 (K/uL)   Lymphocytes Relative 17  12 - 46 (%)   Lymphs Abs 1.9  0.7 - 4.0 (K/uL)   Monocytes Relative 7  3 - 12 (%)   Monocytes Absolute 0.8  0.1 - 1.0 (K/uL)   Eosinophils Relative 0  0 - 5 (%)   Eosinophils Absolute 0.1  0.0 - 0.7 (K/uL)   Basophils Relative 0  0 - 1 (%)   Basophils Absolute 0.0  0.0 - 0.1 (K/uL)  COMPREHENSIVE METABOLIC PANEL      Component Value Range   Sodium 138  135 - 145 (mEq/L)   Potassium  3.9  3.5 - 5.1 (mEq/L)   Chloride 105  96 - 112 (mEq/L)   CO2 25  19 - 32 (mEq/L)   Glucose, Bld 118 (*) 70 - 99 (mg/dL)   BUN 15  6 - 23 (mg/dL)   Creatinine, Ser 4.09  0.50 - 1.35 (mg/dL)   Calcium 9.0  8.4 - 81.1 (mg/dL)   Total Protein 6.7  6.0 - 8.3 (g/dL)   Albumin 3.5  3.5 - 5.2 (g/dL)   AST 12  0 - 37 (U/L)   ALT 15  0 - 53 (U/L)   Alkaline Phosphatase 71  39 - 117 (U/L)   Total Bilirubin 0.4  0.3 - 1.2 (mg/dL)   GFR calc non Af Amer 53 (*) >60 (mL/min)   GFR calc Af Amer >60  >60 (mL/min)  BLOOD GAS, ARTERIAL      Component Value Range   FIO2 .21     Delivery systems ROOM AIR     pH, Arterial 7.411  7.350 - 7.450    pCO2 arterial 37.7  35.0 - 45.0 (mmHg)   pO2, Arterial 64.7 (*) 80.0 - 100.0 (mmHg)   Bicarbonate 23.5  20.0 - 24.0 (mEq/L)   TCO2 20.7  0 - 100 (mmol/L)   Acid-base deficit 0.5  0.0 - 2.0 (mmol/L)   O2 Saturation 93.3     Patient temperature 37.0     Collection site LEFT  RADIAL     Drawn by COLLECTED BY RT     Sample type ARTERIAL     Allens test (pass/fail) PASS  PASS   CULTURE, BLOOD (ROUTINE X 2)      Component Value Range   Specimen Description Blood LEFT ARM 6CC     Special Requests NONE     Culture PENDING     Report Status PENDING    CULTURE, BLOOD (ROUTINE X 2)      Component Value Range   Specimen Description Blood LEFT HAND [facility]     Special Requests NONE     Culture PENDING     Report Status PENDING     Dg Chest 2 View  02/08/2011  *RADIOLOGY REPORT*  Clinical Data: Cough, shortness of breath and fever.  History of coronary artery disease and atrial fibrillation.  CHEST - 2 VIEW  Comparison: 10/28/2010  Findings: There is evidence of interstitial edema without overt airspace edema or pleural fluid.  Slightly asymmetric opacity in the lower right lung may simply be due to atelectasis and vascular crowding.  Cannot exclude early infiltrate.  Stable cardiomegaly.  IMPRESSION: Interstitial edema.  As above, potential early infiltrate in the right lower lung.  Original Report Authenticated By: Reola Calkins, M.D.    Patient with cough and chills that began last night. Here with rales in the right base, fever to 102. Chest xray with early CAP. Fever responded to tylenol. Nebulizer treatment with subjective improvement.  Antibiotics started in the ER.  Pt feels improved after observation and/or treatment in ED. Pt stable in ED with no significant deterioration in condition.Patient to follow up with PCP. Dr Janna Arch.  MDM Reviewed: nursing note and vitals Interpretation: labs and x-ray Total time providing critical care: 30-74 minutes. This excludes time spent performing separately reportable procedures and services.     Nicoletta Dress. Colon Branch, MD 02/08/11 [id number]  Nicoletta Dress. Colon Branch, MD 02/08/11 [phone]

## 2011-02-08 NOTE — ED Notes (Signed)
Cough, fever,  Headache

## 2011-02-08 NOTE — ED Notes (Signed)
Patiet IV dc'd and was given discharge orders and prescription. Patient waiting in lobby for friend to pick him up to go home. NAD noted.

## 2011-02-13 LAB — CULTURE, BLOOD (ROUTINE X 2): Culture: NO GROWTH

## 2011-03-11 LAB — BASIC METABOLIC PANEL
BUN: 20
Calcium: 9.5
Chloride: 95 — ABNORMAL LOW
Creatinine, Ser: 1.58 — ABNORMAL HIGH
GFR calc Af Amer: 54 — ABNORMAL LOW

## 2011-03-11 LAB — DIFFERENTIAL
Basophils Relative: 0
Eosinophils Absolute: 0.3
Lymphs Abs: 1.9
Monocytes Relative: 11
Neutro Abs: 3.1
Neutrophils Relative %: 52

## 2011-03-11 LAB — GLUCOSE, CAPILLARY: Glucose-Capillary: 352 — ABNORMAL HIGH

## 2011-03-11 LAB — CBC
MCHC: 34.9
MCV: 87.6
Platelets: 180
RBC: 5
WBC: 6

## 2011-03-12 LAB — URINE MICROSCOPIC-ADD ON

## 2011-03-12 LAB — BASIC METABOLIC PANEL
BUN: 10
BUN: 18
BUN: 47 — ABNORMAL HIGH
BUN: 50 — ABNORMAL HIGH
BUN: 51 — ABNORMAL HIGH
CO2: 24
CO2: 25
Calcium: 7.4 — ABNORMAL LOW
Calcium: 7.8 — ABNORMAL LOW
Calcium: 8.3 — ABNORMAL LOW
Calcium: 8.6
Chloride: 109
Chloride: 94 — ABNORMAL LOW
Chloride: 99
Creatinine, Ser: 2.14 — ABNORMAL HIGH
GFR calc Af Amer: 35 — ABNORMAL LOW
GFR calc Af Amer: 38 — ABNORMAL LOW
GFR calc Af Amer: 50 — ABNORMAL LOW
GFR calc Af Amer: >60
GFR calc non Af Amer: 29 — ABNORMAL LOW
GFR calc non Af Amer: 29 — ABNORMAL LOW
GFR calc non Af Amer: 31 — ABNORMAL LOW
GFR calc non Af Amer: 41 — ABNORMAL LOW
GFR calc non Af Amer: >60
Glucose, Bld: 177 — ABNORMAL HIGH
Glucose, Bld: 191 — ABNORMAL HIGH
Glucose, Bld: 713
Potassium: 3.3 — ABNORMAL LOW
Potassium: 3.3 — ABNORMAL LOW
Potassium: 3.6
Potassium: 3.6
Potassium: 3.8
Sodium: 132 — ABNORMAL LOW
Sodium: 133 — ABNORMAL LOW
Sodium: 136

## 2011-03-12 LAB — BLOOD GAS, ARTERIAL
TCO2: 17.9
pCO2 arterial: 43.1
pH, Arterial: 7.282 — ABNORMAL LOW
pO2, Arterial: 78.3 — ABNORMAL LOW

## 2011-03-12 LAB — CBC
HCT: 37.5 — ABNORMAL LOW
Platelets: 181
Platelets: 243
RDW: 12.7
RDW: 12.7
WBC: 6.1
WBC: 7.2

## 2011-03-12 LAB — URINALYSIS, ROUTINE W REFLEX MICROSCOPIC
Ketones, ur: 15 — AB
Leukocytes, UA: NEGATIVE
Nitrite: NEGATIVE
Specific Gravity, Urine: 1.01
pH: 5

## 2011-03-12 LAB — DIFFERENTIAL
Basophils Absolute: 0
Basophils Absolute: 0
Eosinophils Absolute: 0.2
Eosinophils Relative: 4
Lymphocytes Relative: 14
Lymphocytes Relative: 28
Lymphs Abs: 1
Lymphs Abs: 1.7
Neutro Abs: 5.5
Neutrophils Relative %: 57
Neutrophils Relative %: 77

## 2011-03-12 LAB — GLUCOSE, CAPILLARY
Glucose-Capillary: 111 — ABNORMAL HIGH
Glucose-Capillary: 124 — ABNORMAL HIGH
Glucose-Capillary: 126 — ABNORMAL HIGH
Glucose-Capillary: 127 — ABNORMAL HIGH
Glucose-Capillary: 134 — ABNORMAL HIGH
Glucose-Capillary: 136 — ABNORMAL HIGH
Glucose-Capillary: 181 — ABNORMAL HIGH
Glucose-Capillary: 216 — ABNORMAL HIGH
Glucose-Capillary: 220 — ABNORMAL HIGH
Glucose-Capillary: 237 — ABNORMAL HIGH
Glucose-Capillary: 255 — ABNORMAL HIGH
Glucose-Capillary: 269 — ABNORMAL HIGH
Glucose-Capillary: 269 — ABNORMAL HIGH
Glucose-Capillary: 294 — ABNORMAL HIGH
Glucose-Capillary: 303 — ABNORMAL HIGH
Glucose-Capillary: 318 — ABNORMAL HIGH
Glucose-Capillary: 338 — ABNORMAL HIGH
Glucose-Capillary: 347 — ABNORMAL HIGH
Glucose-Capillary: 413 — ABNORMAL HIGH
Glucose-Capillary: 569
Glucose-Capillary: >600

## 2011-03-12 LAB — CULTURE, BLOOD (ROUTINE X 2)
Culture: NO GROWTH
Report Status: 11122009

## 2011-03-12 LAB — TROPONIN I: Troponin I: 0.03

## 2011-03-12 LAB — CARDIAC PANEL(CRET KIN+CKTOT+MB+TROPI)
CK, MB: 2.8
CK, MB: 3
Relative Index: INVALID
Relative Index: INVALID
Total CK: 50
Troponin I: 0.02
Troponin I: 0.03
Troponin I: 0.03

## 2011-03-12 LAB — URINE CULTURE: Culture: NO GROWTH

## 2011-03-19 LAB — URINALYSIS, ROUTINE W REFLEX MICROSCOPIC
Glucose, UA: 100 — AB
Specific Gravity, Urine: 1.02
Urobilinogen, UA: >8 — ABNORMAL HIGH

## 2011-03-19 LAB — URINE MICROSCOPIC-ADD ON

## 2011-03-19 LAB — APTT: aPTT: 28

## 2011-03-19 LAB — HEMOGLOBIN AND HEMATOCRIT, BLOOD
HCT: 36.4 — ABNORMAL LOW
HCT: 36.4 — ABNORMAL LOW
HCT: 36.8 — ABNORMAL LOW
HCT: 37.1 — ABNORMAL LOW
HCT: 37.6 — ABNORMAL LOW
HCT: 37.7 — ABNORMAL LOW
HCT: 38.5 — ABNORMAL LOW
HCT: 39.5
HCT: 41.2
Hemoglobin: 12.6 — ABNORMAL LOW
Hemoglobin: 12.6 — ABNORMAL LOW
Hemoglobin: 12.7 — ABNORMAL LOW
Hemoglobin: 12.9 — ABNORMAL LOW
Hemoglobin: 13.5
Hemoglobin: 13.6
Hemoglobin: 14.6
Hemoglobin: 14.8

## 2011-03-19 LAB — CARDIAC PANEL(CRET KIN+CKTOT+MB+TROPI)
Total CK: 102
Troponin I: 0.03
Troponin I: 0.03

## 2011-03-19 LAB — CULTURE, BLOOD (ROUTINE X 2)
Culture: NO GROWTH
Report Status: 11212008

## 2011-03-19 LAB — DIFFERENTIAL
Basophils Absolute: 0
Basophils Absolute: 0
Basophils Relative: 1
Eosinophils Relative: 3
Lymphocytes Relative: 14
Lymphocytes Relative: 17
Monocytes Absolute: 0.7
Monocytes Absolute: 1
Monocytes Relative: 10
Monocytes Relative: 11
Neutro Abs: 4.6

## 2011-03-19 LAB — BASIC METABOLIC PANEL
CO2: 29
Calcium: 9.2
Chloride: 102
GFR calc Af Amer: >60
GFR calc Af Amer: >60
GFR calc non Af Amer: >60
Glucose, Bld: 129 — ABNORMAL HIGH
Potassium: 3.7
Sodium: 137
Sodium: 139

## 2011-03-19 LAB — CBC
HCT: 42.2
Hemoglobin: 14.6
Hemoglobin: 14.7
MCHC: 34.9
RBC: 4.87
RBC: 4.87
RDW: 13.4
RDW: 14

## 2011-03-19 LAB — URINE CULTURE: Colony Count: NO GROWTH

## 2011-03-19 LAB — PROTIME-INR
INR: 1
Prothrombin Time: 13

## 2011-03-19 LAB — LIPID PANEL
HDL: 36 — ABNORMAL LOW
Triglycerides: 49
VLDL: 10

## 2011-03-19 LAB — POCT CARDIAC MARKERS: CKMB, poc: 1.3

## 2011-06-09 ENCOUNTER — Emergency Department (HOSPITAL_COMMUNITY): Payer: Medicare Other

## 2011-06-09 ENCOUNTER — Encounter (HOSPITAL_COMMUNITY): Payer: Self-pay | Admitting: *Deleted

## 2011-06-09 ENCOUNTER — Inpatient Hospital Stay (HOSPITAL_COMMUNITY)
Admission: EM | Admit: 2011-06-09 | Discharge: 2011-06-10 | DRG: 641 | Disposition: A | Discharge status: Home / Self Care | Payer: Medicare Other | Attending: Family Medicine | Admitting: Family Medicine

## 2011-06-09 DIAGNOSIS — J4489 Other specified chronic obstructive pulmonary disease: Secondary | ICD-10-CM | POA: Diagnosis present

## 2011-06-09 DIAGNOSIS — I2510 Atherosclerotic heart disease of native coronary artery without angina pectoris: Secondary | ICD-10-CM | POA: Diagnosis present

## 2011-06-09 DIAGNOSIS — N390 Urinary tract infection, site not specified: Secondary | ICD-10-CM | POA: Diagnosis present

## 2011-06-09 DIAGNOSIS — N189 Chronic kidney disease, unspecified: Secondary | ICD-10-CM | POA: Diagnosis present

## 2011-06-09 DIAGNOSIS — E860 Dehydration: Principal | ICD-10-CM | POA: Diagnosis present

## 2011-06-09 DIAGNOSIS — Z85528 Personal history of other malignant neoplasm of kidney: Secondary | ICD-10-CM

## 2011-06-09 DIAGNOSIS — E119 Type 2 diabetes mellitus without complications: Secondary | ICD-10-CM | POA: Diagnosis present

## 2011-06-09 DIAGNOSIS — I129 Hypertensive chronic kidney disease with stage 1 through stage 4 chronic kidney disease, or unspecified chronic kidney disease: Secondary | ICD-10-CM | POA: Diagnosis present

## 2011-06-09 DIAGNOSIS — J449 Chronic obstructive pulmonary disease, unspecified: Secondary | ICD-10-CM | POA: Diagnosis present

## 2011-06-09 DIAGNOSIS — A09 Infectious gastroenteritis and colitis, unspecified: Secondary | ICD-10-CM | POA: Diagnosis present

## 2011-06-09 DIAGNOSIS — Z905 Acquired absence of kidney: Secondary | ICD-10-CM

## 2011-06-09 DIAGNOSIS — E785 Hyperlipidemia, unspecified: Secondary | ICD-10-CM | POA: Diagnosis present

## 2011-06-09 HISTORY — DX: Malignant (primary) neoplasm, unspecified: C80.1

## 2011-06-09 LAB — LIPID PANEL
Cholesterol: 122 mg/dL (ref 0–200)
HDL: 50 mg/dL (ref >39)
Triglycerides: 63 mg/dL (ref <150)

## 2011-06-09 LAB — URINALYSIS, ROUTINE W REFLEX MICROSCOPIC
Glucose, UA: NEGATIVE mg/dL
Leukocytes, UA: NEGATIVE
Nitrite: NEGATIVE
Specific Gravity, Urine: 1.025 (ref 1.005–1.030)
pH: 5.5 (ref 5.0–8.0)

## 2011-06-09 LAB — CBC
MCH: 27.4 pg (ref 26.0–34.0)
MCHC: 32.4 g/dL (ref 30.0–36.0)
RDW: 14.4 % (ref 11.5–15.5)

## 2011-06-09 LAB — DIFFERENTIAL
Basophils Absolute: 0 10*3/uL (ref 0.0–0.1)
Basophils Relative: 0 % (ref 0–1)
Eosinophils Absolute: 0.3 10*3/uL (ref 0.0–0.7)
Monocytes Absolute: 0.8 10*3/uL (ref 0.1–1.0)
Monocytes Relative: 11 % (ref 3–12)
Neutro Abs: 4 10*3/uL (ref 1.7–7.7)
Neutrophils Relative %: 56 % (ref 43–77)

## 2011-06-09 LAB — COMPREHENSIVE METABOLIC PANEL
AST: 20 U/L (ref 0–37)
Albumin: 3.6 g/dL (ref 3.5–5.2)
BUN: 26 mg/dL — ABNORMAL HIGH (ref 6–23)
Chloride: 99 mEq/L (ref 96–112)
Creatinine, Ser: 1.88 mg/dL — ABNORMAL HIGH (ref 0.50–1.35)
Potassium: 3.8 mEq/L (ref 3.5–5.1)
Total Bilirubin: 0.3 mg/dL (ref 0.3–1.2)
Total Protein: 7.4 g/dL (ref 6.0–8.3)

## 2011-06-09 LAB — GLUCOSE, CAPILLARY: Glucose-Capillary: 113 mg/dL — ABNORMAL HIGH (ref 70–99)

## 2011-06-09 LAB — LACTIC ACID, PLASMA: Lactic Acid, Venous: 2.8 mmol/L — ABNORMAL HIGH (ref 0.5–2.2)

## 2011-06-09 LAB — LIPASE, BLOOD: Lipase: 22 U/L (ref 11–59)

## 2011-06-09 LAB — URINE MICROSCOPIC-ADD ON

## 2011-06-09 LAB — CARDIAC PANEL(CRET KIN+CKTOT+MB+TROPI)
Relative Index: 1.5 (ref 0.0–2.5)
Troponin I: <0.3 ng/mL (ref <0.30)

## 2011-06-09 MED ORDER — ONDANSETRON HCL 4 MG/2ML IJ SOLN: 4.0000 mg | INTRAMUSCULAR | Status: DC | PRN

## 2011-06-09 MED ORDER — TAMSULOSIN HCL 0.4 MG PO CAPS
0.4000 mg | ORAL_CAPSULE | Freq: Every day | ORAL | Status: DC
  Administered 2011-06-09 – 2011-06-10 (×2): 0.4 mg via ORAL
  Filled 2011-06-09 (×3): qty 1

## 2011-06-09 MED ORDER — SODIUM CHLORIDE 0.9 % IV BOLUS (SEPSIS)
1000.0000 mL | Freq: Once | INTRAVENOUS | Status: AC
  Administered 2011-06-09: 1000 mL via INTRAVENOUS

## 2011-06-09 MED ORDER — ONDANSETRON HCL 4 MG/2ML IJ SOLN
4.0000 mg | Freq: Once | INTRAMUSCULAR | Status: AC
  Administered 2011-06-09: 4 mg via INTRAVENOUS
  Filled 2011-06-09: qty 2

## 2011-06-09 MED ORDER — INSULIN GLARGINE 100 UNIT/ML ~~LOC~~ SOLN
25.0000 [IU] | Freq: Every day | SUBCUTANEOUS | Status: DC
  Administered 2011-06-09: 25 [IU] via SUBCUTANEOUS
  Filled 2011-06-09: qty 3

## 2011-06-09 MED ORDER — LISINOPRIL 10 MG PO TABS: 20.0000 mg | ORAL_TABLET | Freq: Every day | ORAL | Status: DC

## 2011-06-09 MED ORDER — METOPROLOL TARTRATE 50 MG PO TABS
50.0000 mg | ORAL_TABLET | Freq: Two times a day (BID) | ORAL | Status: DC
  Administered 2011-06-09 – 2011-06-10 (×3): 50 mg via ORAL
  Filled 2011-06-09 (×3): qty 1

## 2011-06-09 MED ORDER — ENOXAPARIN SODIUM 40 MG/0.4ML ~~LOC~~ SOLN
40.0000 mg | SUBCUTANEOUS | Status: DC
  Administered 2011-06-09: 40 mg via SUBCUTANEOUS
  Filled 2011-06-09: qty 0.4

## 2011-06-09 MED ORDER — SIMVASTATIN 20 MG PO TABS
20.0000 mg | ORAL_TABLET | Freq: Every day | ORAL | Status: DC
  Administered 2011-06-09: 20 mg via ORAL
  Filled 2011-06-09 (×2): qty 1

## 2011-06-09 MED ORDER — DEXTROSE 5 % IV SOLN
1.0000 g | INTRAVENOUS | Status: DC
  Administered 2011-06-09: 1 g via INTRAVENOUS
  Filled 2011-06-09 (×2): qty 10

## 2011-06-09 MED ORDER — ONDANSETRON HCL 4 MG/2ML IJ SOLN
INTRAMUSCULAR | Status: AC
  Filled 2011-06-09: qty 2

## 2011-06-09 MED ORDER — PAROXETINE HCL 20 MG PO TABS
10.0000 mg | ORAL_TABLET | Freq: Every day | ORAL | Status: DC
  Administered 2011-06-09 – 2011-06-10 (×2): 10 mg via ORAL
  Filled 2011-06-09 (×2): qty 2
  Filled 2011-06-09: qty 0.5

## 2011-06-09 MED ORDER — BENAZEPRIL HCL 10 MG PO TABS
10.0000 mg | ORAL_TABLET | Freq: Every day | ORAL | Status: DC
  Administered 2011-06-09 – 2011-06-10 (×2): 10 mg via ORAL
  Filled 2011-06-09 (×3): qty 1

## 2011-06-09 MED ORDER — GLIPIZIDE 5 MG PO TABS
5.0000 mg | ORAL_TABLET | Freq: Every day | ORAL | Status: DC
  Administered 2011-06-10: 5 mg via ORAL
  Filled 2011-06-09 (×3): qty 1

## 2011-06-09 MED ORDER — SODIUM CHLORIDE 0.9 % IV SOLN
INTRAVENOUS | Status: DC
  Administered 2011-06-09: 100 mL via INTRAVENOUS
  Administered 2011-06-09: via INTRAVENOUS

## 2011-06-09 MED ORDER — DEXTROSE 5 % IV SOLN
1.0000 g | INTRAVENOUS | Status: DC
  Administered 2011-06-09: 1 g via INTRAVENOUS
  Filled 2011-06-09: qty 10

## 2011-06-09 MED ORDER — LORAZEPAM 2 MG/ML IJ SOLN
1.0000 mg | Freq: Once | INTRAMUSCULAR | Status: AC
  Administered 2011-06-09: 1 mg via INTRAVENOUS
  Filled 2011-06-09: qty 1

## 2011-06-09 MED ORDER — INFLUENZA VIRUS VACC SPLIT PF IM SUSP
0.5000 mL | INTRAMUSCULAR | Status: DC
  Filled 2011-06-09: qty 0.5

## 2011-06-09 NOTE — H&P (Signed)
[id number] 

## 2011-06-09 NOTE — ED Notes (Signed)
CRITICAL VALUE ALERT  Critical value received:  ckmb 8.1  Date of notification:  06/09/2011  Time of notification:  1518  Critical value read back:yes  Nurse who received alert:  Bronson Curb, RN  MD notified (1st page):  Dr. Freida Busman  Time of first page:  1518  MD notified (2nd page):  Time of second page:  Responding MD:  Dr. Freida Busman  Time MD responded:  [phone]

## 2011-06-09 NOTE — ED Provider Notes (Addendum)
History   This chart was scribed for Bobby Baker, MD by Clarita Crane. The patient was seen in room APA14/APA14 and the patient's care was started at 9:47AM.   CSN: [id number]  Arrival date & time 06/09/11  4098   First MD Initiated Contact with Patient 06/09/11 0945      Chief Complaint  Patient presents with  . Hyperglycemia  . Emesis    (Consider location/radiation/quality/duration/timing/severity/associated sxs/prior treatment) HPI [name] is a 70 y.o. male who presents to the Emergency Department complaining of constant moderate nausea and vomiting onset 1 week ago and persistent since with associated fever and abdominal pain which began last night. Patient also notes that his blood glucose level has not been well controlled for the past week but states he performed home measure several days ago which produced a value of 135. States symptoms are relieved by nothing. Denies diarrhea, chest pain, SOB and history of similar symptoms. Patient with h/o diabetes, CAD, HTN and right nephrectomy.   PCP- Janna Arch  Past Medical History  Diagnosis Date  . Diabetes mellitus   . Coronary artery disease   . Hypertension   . Cancer     Past Surgical History  Procedure Date  . Back surgery     History reviewed. No pertinent family history.  History  Substance Use Topics  . Smoking status: Former Games developer  . Smokeless tobacco: Not on file  . Alcohol Use: No      Review of Systems 10 Systems reviewed and are negative for acute change except as noted in the HPI.  Allergies  Review of patient's allergies indicates no known allergies.  Home Medications   Current Outpatient Rx  Name Route Sig Dispense Refill  . UNABLE TO FIND  Med Name:        BP 150/76  Pulse 93  Temp(Src) 97.7 F (36.5 C) (Oral)  Resp 20  Ht 5' 7.5" (1.715 m)  Wt 252 lb (114.306 kg)  BMI 38.89 kg/m2  SpO2 97%  Physical Exam  Nursing note and vitals reviewed. Constitutional: He is  oriented to person, place, and time. He appears well-developed and well-nourished. No distress.  HENT:  Head: Normocephalic and atraumatic.  Eyes: EOM are normal. Pupils are equal, round, and reactive to light.  Neck: Neck supple. No tracheal deviation present.  Cardiovascular: Normal rate.   Pulmonary/Chest: Effort normal. No respiratory distress.  Abdominal: Soft. He exhibits no distension. There is no tenderness.  Musculoskeletal: Normal range of motion. He exhibits no edema.  Neurological: He is alert and oriented to person, place, and time. No sensory deficit.  Skin: Skin is warm and dry.  Psychiatric: He has a normal mood and affect. His behavior is normal.    ED Course  Procedures (including critical care time)  DIAGNOSTIC STUDIES: Oxygen Saturation is 97% on room air, normal by my interpretation.    COORDINATION OF CARE:    Labs Reviewed  GLUCOSE, CAPILLARY - Abnormal; Notable for the following:    Glucose-Capillary 113 (*)    All other components within normal limits  COMPREHENSIVE METABOLIC PANEL - Abnormal; Notable for the following:    Glucose, Bld 113 (*)    BUN 26 (*)    Creatinine, Ser 1.88 (*)    GFR calc non Af Amer 35 (*)    GFR calc Af Amer 41 (*)    All other components within normal limits  URINALYSIS, ROUTINE W REFLEX MICROSCOPIC - Abnormal; Notable for the following:  Hgb urine dipstick SMALL (*)    All other components within normal limits  LACTIC ACID, PLASMA - Abnormal; Notable for the following:    Lactic Acid, Venous 2.8 (*)    All other components within normal limits  CBC  DIFFERENTIAL  LIPASE, BLOOD  URINE MICROSCOPIC-ADD ON   Dg Abd Acute W/chest  06/09/2011  *RADIOLOGY REPORT*  Clinical Data: Abdominal pain, vomiting  ACUTE ABDOMEN SERIES (ABDOMEN 2 VIEW & CHEST 1 VIEW)  Comparison: 04/26/07  Findings: Borderline cardiomegaly noted.  No acute infiltrate or pulmonary edema.  There is nonspecific nonobstructive bowel gas pattern.  Degenerative changes are noted thoracolumbar spine.  No free abdominal air  IMPRESSION: .  No acute disease.  Borderline cardiomegaly.  Nonspecific nonobstructive bowel gas pattern.  No free abdominal air. Degenerative changes thoracolumbar spine.  Original Report Authenticated By: Natasha Mead, M.D.     No diagnosis found.    MDM  Patient given IV fluids and Zofran for his vomiting. He urinalysis results noted. Patient started on IV antibiotics. Patient's renal function noted to be elevated we'll admit for dehydration and treatment of his urinary tract infection      I personally performed the services described in this documentation, which was scribed in my presence. The recorded information has been reviewed and considered.     Bobby Baker, MD 06/09/11 1308  Bobby Baker, MD 06/09/11 [phone]

## 2011-06-09 NOTE — H&P (Signed)
NAMESHARAD, [name]                ACCOUNT NO.:  [id number]  MEDICAL RECORD NO.:  [id number]  LOCATION:  IC11                          FACILITY:  [facility]  PHYSICIAN:  Melvyn Novas, MDDATE OF BIRTH:  17-Feb-1941  DATE OF ADMISSION:  06/09/2011 DATE OF DISCHARGE:  LH                             HISTORY & PHYSICAL   The patient is a 70 year old, white male, totally illiterate who has a history of coronary artery disease, hypertension, hyperlipidemia, insulin-dependent diabetes, obesity who complains of nausea and recurrent vomiting over the preceding 48 hours.  He vomited green bilious material.  There was no evidence of hematemesis, melena, hematochezia. He presents to the ER.  His lipase is within normal limits.  He denies angina, anginal equivalents, orthopnea, PND, or diaphoresis.  The patient admitted with volume depletion intravascularly, gastroenteritis, and some increasing renal failure with a creatinine of 1.88.  He is status post right nephrectomy for renal cell carcinoma 3 years ago, and we will be given aggressive fluid hydration and monitoring of glucoses and monitoring of clinical symptomatology.  PAST MEDICAL HISTORY:  Significant for coronary artery disease, COPD, hypertension, hyperlipidemia, and insulin-dependent diabetes.  PAST SURGICAL HISTORY:  Remarkable for right nephrectomy 3 years ago.  He has no known allergies.  He smoked for many years, 1 pack per day, quit 2 years ago.  Lives alone, has a girlfriend, rides a motorcycle, is unemployed and illiterate.  Current medicines are: 1. Cholesterol pill. 2. Lantus insulin 40 units h.s. 3. ACE inhibitor, I believe lisinopril. 4. Pravachol 40 mg p.o. daily. 5. Baby aspirin.  REVIEW OF SYSTEMS:  Negative for seizures, tremors, polyuria, polydipsia, syncope, orthopnea PND.  PHYSICAL EXAMINATION:  VITAL SIGNS:  Blood pressure 137/87, temperature 97.7, pulse 73 and regular, respiratory rate is 18, O2  sat is 91%. HEAD:  Normocephalic, atraumatic.  Eyes PERRLA. Extraocular movements intact.  Sclerae clear.  Conjunctivae pink. NECK:  No JVD.  No carotid bruits.  No thyromegaly.  No thyroid bruits. LUNGS:  Prolonged expiratory phase, diminished breath sounds at bases. No rales, wheeze, or rhonchi appreciable.  HEART:  Regular rhythm.  No murmurs, gallops, heaves, thrills, or rubs. ABDOMEN:  Obese, soft, nontender.  Bowel sounds normoactive.  No guarding, rebound, or hepatosplenomegaly.  RECTAL:  Heme negative. EXTREMITIES:  No clubbing, cyanosis, or edema. NEUROLOGIC:  Cranial nerves II-XII grossly intact.  Patient moves all 4 extremities.  Plantars are downgoing.  Abdominal and chest x-ray shows no free abdominal air. DJD of the LS spine, otherwise within normal limits.  IMPRESSION: 1. Gastroenteritis of 2 day's duration. 2. Intravascular volume depletion. 3. Worsening renal failure, status post right nephrectomy. 4. Insulin-dependent diabetes. 5. Hypertension. 6. Coronary artery disease. 7. Hyperlipidemia.  PLAN:  To admit as per orders.     Melvyn Novas, MD     RMD/MEDQ  D:  06/09/2011  T:  06/09/2011  Job:  [id number]

## 2011-06-09 NOTE — ED Notes (Signed)
Pt states his blood sugar has been uncontrolled going up and down for a week. Pt also actively vomiting while in triage.

## 2011-06-09 NOTE — Progress Notes (Signed)
ANTICOAGULATION CONSULT NOTE - Initial Consult  Pharmacy Consult for Lovenox Indication: VTE prophylaxis  No Known Allergies  Patient Measurements: Height: 5' 7.5" (171.5 cm) Weight: 252 lb (114.306 kg) IBW/kg (Calculated) : 67.25    Vital Signs: Temp: 97.7 F (36.5 C) (12/30 0944) Temp src: Oral (12/30 0944) BP: 137/87 mmHg (12/30 1337) Pulse Rate: 73  (12/30 1337)  Labs:  Basename 06/09/11 0957  HGB 13.8  HCT 42.6  PLT 220  APTT --  LABPROT --  INR --  HEPARINUNFRC --  CREATININE 1.88*  CKTOTAL --  CKMB --  TROPONINI --   Estimated Creatinine Clearance: 45.8 ml/min (by C-G formula based on Cr of 1.88).  Medical History: Past Medical History  Diagnosis Date  . Diabetes mellitus   . Coronary artery disease   . Hypertension   . Cancer     Medications:  Scheduled:    . benazepril  10 mg Oral Daily  . enoxaparin (LOVENOX) injection  40 mg Subcutaneous Q24H  . glipiZIDE  5 mg Oral QAC breakfast  . insulin glargine  25 Units Subcutaneous QHS  . LORazepam  1 mg Intravenous Once  . metoprolol tartrate  50 mg Oral BID  . ondansetron  4 mg Intravenous Once  . PARoxetine  10 mg Oral Daily  . simvastatin  20 mg Oral q1800  . sodium chloride  1,000 mL Intravenous Once  . sodium chloride  1,000 mL Intravenous Once  . Tamsulosin HCl  0.4 mg Oral Daily  . DISCONTD: lisinopril  20 mg Oral Daily    Assessment: CrCL > 30 ml/min  Goal of Therapy:  DVT prophylaxis   Plan:  Lovenox 40 mg SQ every 24 hours  [name], [name] 06/09/2011,2:14 PM

## 2011-06-10 LAB — BASIC METABOLIC PANEL
CO2: 30 mEq/L (ref 19–32)
Glucose, Bld: 109 mg/dL — ABNORMAL HIGH (ref 70–99)
Potassium: 4.8 mEq/L (ref 3.5–5.1)
Sodium: 139 mEq/L (ref 135–145)

## 2011-06-10 LAB — HEPATIC FUNCTION PANEL
ALT: 13 U/L (ref 0–53)
AST: 17 U/L (ref 0–37)
Albumin: 3.2 g/dL — ABNORMAL LOW (ref 3.5–5.2)
Alkaline Phosphatase: 65 U/L (ref 39–117)
Total Bilirubin: 0.2 mg/dL — ABNORMAL LOW (ref 0.3–1.2)

## 2011-06-10 LAB — TSH: TSH: 1.052 u[IU]/mL (ref 0.350–4.500)

## 2011-06-10 MED ORDER — METOPROLOL TARTRATE 50 MG PO TABS: 50.0000 mg | ORAL_TABLET | Freq: Two times a day (BID) | ORAL | Status: DC

## 2011-06-10 MED ORDER — BENAZEPRIL HCL 10 MG PO TABS: 10.0000 mg | ORAL_TABLET | Freq: Every day | ORAL | Status: DC

## 2011-06-10 MED ORDER — PAROXETINE HCL 10 MG PO TABS: 20.0000 mg | ORAL_TABLET | Freq: Every day | ORAL | Status: DC

## 2011-06-10 MED ORDER — TAMSULOSIN HCL 0.4 MG PO CAPS: 0.4000 mg | ORAL_CAPSULE | Freq: Every day | ORAL | Status: DC

## 2011-06-10 MED ORDER — GLIPIZIDE 5 MG PO TABS: 5.0000 mg | ORAL_TABLET | Freq: Every day | ORAL | Status: DC

## 2011-06-10 NOTE — Progress Notes (Signed)
Pt transferred from ICU to room 202. Assessment completed and charted. Pt bed alarm set. Pt stable on transfer.

## 2011-06-10 NOTE — Discharge Summary (Signed)
[id number] 

## 2011-06-10 NOTE — Discharge Summary (Signed)
[name], [name]                ACCOUNT NO.:  [id number]  MEDICAL RECORD NO.:  [id number]  LOCATION:  A202                          FACILITY:  [facility]  PHYSICIAN:  Melvyn Novas, MDDATE OF BIRTH:  [birth year]  DATE OF ADMISSION:  06/09/2011 DATE OF DISCHARGE:  LH                              DISCHARGE SUMMARY   The patient is a 70 year old totally illiterate white male who has a history of hypertension, hyperlipidemia, insulin-dependent diabetes, and obesity, as well as benign prostatic hypertrophy who essentially came into the hospital with gastroenteritis, nausea, vomiting, and diffuse abdominal pain, some episodes of diarrhea and he was in quite amount of distress.  He had no evidence of alcohol intoxication.  He was placed on clear liquid diet.  His lipase was normal.  Acute abdominal series, x- rays revealed a nonobstructive pattern of his x-rays and he was placed on clear liquids and given his antihypertensives as well as his diabetic medicines and over 24-hour period, he had no nausea vomiting.  He improved tremendously on day of discharge which was 24 hours after admission.  His blood pressure is 144/80, temperature 97.3, pulse 68 and regular, respiratory rate is 20.  WBC 7.2, hemoglobin 13.8, potassium 4.8.  Creatinine improved from 1.88 to 1.36.  The patient was cautioned not to over indulge in food.  He does not imbibe alcohol.  He states he has some confusion about his medicines.  DISCHARGE MEDICINES: 1. Lotensin 10 mg p.o. daily. 2. Glucotrol 5 mg p.o. daily. 3. Lantus 25 units subcu at bedtime. 4. Lopressor 50 mg p.o. b.i.d. 5. Paxil 10 mg p.o. daily. 6. Pravachol 40 mg p.o. daily. 7. Flomax 0.4 mg p.o. daily.  The patient will follow up in the office in 3 days' time.     Melvyn Novas, MD     RMD/MEDQ  D:  06/10/2011  T:  06/10/2011  Job:  [id number]

## 2011-06-10 NOTE — Progress Notes (Signed)
IV removed, site WNL.  Pt given d/c instructions and new prescriptions.  Discussed in detail home care with patient and discussed home medications, patient verbalizes understanding. F/U appointment with Dr. Janna Arch in place (January 8th, 0945),  pt states they will keep appointments. Pt is stable and alert and oriented times three at this time. Pt taken to exit in wheelchair by staff member.

## 2011-06-11 ENCOUNTER — Other Ambulatory Visit: Payer: Self-pay

## 2011-06-11 ENCOUNTER — Inpatient Hospital Stay (HOSPITAL_COMMUNITY)
Admission: EM | Admit: 2011-06-11 | Discharge: 2011-06-14 | DRG: 293 | Disposition: A | Discharge status: Home / Self Care | Payer: Medicare Other | Attending: Family Medicine | Admitting: Family Medicine

## 2011-06-11 ENCOUNTER — Emergency Department (HOSPITAL_COMMUNITY): Payer: Medicare Other

## 2011-06-11 ENCOUNTER — Encounter (HOSPITAL_COMMUNITY): Payer: Self-pay

## 2011-06-11 DIAGNOSIS — J4489 Other specified chronic obstructive pulmonary disease: Secondary | ICD-10-CM | POA: Diagnosis present

## 2011-06-11 DIAGNOSIS — I10 Essential (primary) hypertension: Secondary | ICD-10-CM | POA: Diagnosis present

## 2011-06-11 DIAGNOSIS — E785 Hyperlipidemia, unspecified: Secondary | ICD-10-CM | POA: Diagnosis present

## 2011-06-11 DIAGNOSIS — J449 Chronic obstructive pulmonary disease, unspecified: Secondary | ICD-10-CM | POA: Diagnosis present

## 2011-06-11 DIAGNOSIS — I509 Heart failure, unspecified: Principal | ICD-10-CM | POA: Diagnosis present

## 2011-06-11 DIAGNOSIS — R531 Weakness: Secondary | ICD-10-CM

## 2011-06-11 DIAGNOSIS — I447 Left bundle-branch block, unspecified: Secondary | ICD-10-CM | POA: Diagnosis present

## 2011-06-11 DIAGNOSIS — E119 Type 2 diabetes mellitus without complications: Secondary | ICD-10-CM | POA: Diagnosis present

## 2011-06-11 DIAGNOSIS — I2510 Atherosclerotic heart disease of native coronary artery without angina pectoris: Secondary | ICD-10-CM | POA: Diagnosis present

## 2011-06-11 DIAGNOSIS — R0902 Hypoxemia: Secondary | ICD-10-CM

## 2011-06-11 HISTORY — DX: Pneumonia, unspecified organism: J18.9

## 2011-06-11 HISTORY — DX: Pure hypercholesterolemia, unspecified: E78.00

## 2011-06-11 HISTORY — DX: Heart failure, unspecified: I50.9

## 2011-06-11 LAB — COMPREHENSIVE METABOLIC PANEL
ALT: 13 U/L (ref 0–53)
CO2: 30 mEq/L (ref 19–32)
Calcium: 9 mg/dL (ref 8.4–10.5)
Creatinine, Ser: 1.34 mg/dL (ref 0.50–1.35)
GFR calc Af Amer: 61 mL/min — ABNORMAL LOW (ref >90)
GFR calc non Af Amer: 53 mL/min — ABNORMAL LOW (ref >90)
Glucose, Bld: 110 mg/dL — ABNORMAL HIGH (ref 70–99)
Sodium: 139 mEq/L (ref 135–145)
Total Protein: 6.6 g/dL (ref 6.0–8.3)

## 2011-06-11 LAB — GLUCOSE, CAPILLARY
Glucose-Capillary: 118 mg/dL — ABNORMAL HIGH (ref 70–99)
Glucose-Capillary: 158 mg/dL — ABNORMAL HIGH (ref 70–99)

## 2011-06-11 LAB — DIFFERENTIAL
Eosinophils Absolute: 0.1 10*3/uL (ref 0.0–0.7)
Eosinophils Relative: 1 % (ref 0–5)
Lymphs Abs: 1.1 10*3/uL (ref 0.7–4.0)
Monocytes Absolute: 0.6 10*3/uL (ref 0.1–1.0)

## 2011-06-11 LAB — CBC
HCT: 39.6 % (ref 39.0–52.0)
MCH: 27.2 pg (ref 26.0–34.0)
MCV: 84.1 fL (ref 78.0–100.0)
Platelets: 183 10*3/uL (ref 150–400)
RDW: 13.9 % (ref 11.5–15.5)

## 2011-06-11 LAB — CARDIAC PANEL(CRET KIN+CKTOT+MB+TROPI)
CK, MB: 3.2 ng/mL (ref 0.3–4.0)
Relative Index: 1.4 (ref 0.0–2.5)
Total CK: 227 U/L (ref 7–232)
Troponin I: <0.3 ng/mL (ref <0.30)

## 2011-06-11 LAB — URINALYSIS, ROUTINE W REFLEX MICROSCOPIC
Bilirubin Urine: NEGATIVE
Glucose, UA: NEGATIVE mg/dL
Ketones, ur: NEGATIVE mg/dL
Protein, ur: NEGATIVE mg/dL
Urobilinogen, UA: 0.2 mg/dL (ref 0.0–1.0)

## 2011-06-11 LAB — URINE MICROSCOPIC-ADD ON

## 2011-06-11 MED ORDER — BENAZEPRIL HCL 10 MG PO TABS
10.0000 mg | ORAL_TABLET | Freq: Every day | ORAL | Status: DC
  Administered 2011-06-12 – 2011-06-14 (×3): 10 mg via ORAL
  Filled 2011-06-11 (×3): qty 1

## 2011-06-11 MED ORDER — NITROGLYCERIN 0.4 MG/HR TD PT24
0.4000 mg | MEDICATED_PATCH | Freq: Every day | TRANSDERMAL | Status: DC
  Administered 2011-06-11 – 2011-06-14 (×4): 0.4 mg via TRANSDERMAL
  Filled 2011-06-11 (×4): qty 1

## 2011-06-11 MED ORDER — CLOPIDOGREL BISULFATE 75 MG PO TABS
75.0000 mg | ORAL_TABLET | Freq: Every day | ORAL | Status: DC
  Administered 2011-06-12 – 2011-06-14 (×3): 75 mg via ORAL
  Filled 2011-06-11 (×3): qty 1

## 2011-06-11 MED ORDER — DILTIAZEM HCL ER 60 MG PO CP12
120.0000 mg | ORAL_CAPSULE | Freq: Every day | ORAL | Status: DC
  Filled 2011-06-11 (×2): qty 2

## 2011-06-11 MED ORDER — IPRATROPIUM BROMIDE 0.02 % IN SOLN
0.5000 mg | Freq: Four times a day (QID) | RESPIRATORY_TRACT | Status: DC
  Administered 2011-06-12 – 2011-06-14 (×10): 0.5 mg via RESPIRATORY_TRACT
  Filled 2011-06-11 (×10): qty 2.5

## 2011-06-11 MED ORDER — INSULIN ASPART 100 UNIT/ML ~~LOC~~ SOLN
5.0000 [IU] | Freq: Three times a day (TID) | SUBCUTANEOUS | Status: DC
  Administered 2011-06-11 – 2011-06-14 (×7): 5 [IU] via SUBCUTANEOUS
  Filled 2011-06-11: qty 3

## 2011-06-11 MED ORDER — HYDROCODONE-ACETAMINOPHEN 5-325 MG PO TABS: 2.0000 | ORAL_TABLET | Freq: Four times a day (QID) | ORAL | Status: DC | PRN

## 2011-06-11 MED ORDER — INSULIN GLARGINE 100 UNIT/ML ~~LOC~~ SOLN
30.0000 [IU] | Freq: Every day | SUBCUTANEOUS | Status: DC
  Administered 2011-06-11 – 2011-06-13 (×2): 30 [IU] via SUBCUTANEOUS
  Filled 2011-06-11: qty 3

## 2011-06-11 MED ORDER — ALBUTEROL SULFATE (5 MG/ML) 0.5% IN NEBU
2.5000 mg | INHALATION_SOLUTION | RESPIRATORY_TRACT | Status: DC
  Administered 2011-06-11: 2.5 mg via RESPIRATORY_TRACT
  Filled 2011-06-11: qty 0.5

## 2011-06-11 MED ORDER — ENOXAPARIN SODIUM 40 MG/0.4ML ~~LOC~~ SOLN: 40.0000 mg | Freq: Two times a day (BID) | SUBCUTANEOUS | Status: DC

## 2011-06-11 MED ORDER — PAROXETINE HCL 20 MG PO TABS
10.0000 mg | ORAL_TABLET | Freq: Every day | ORAL | Status: DC
  Administered 2011-06-12 – 2011-06-14 (×3): 10 mg via ORAL
  Filled 2011-06-11 (×3): qty 1

## 2011-06-11 MED ORDER — TAMSULOSIN HCL 0.4 MG PO CAPS
0.4000 mg | ORAL_CAPSULE | Freq: Every day | ORAL | Status: DC
  Administered 2011-06-11 – 2011-06-14 (×4): 0.4 mg via ORAL
  Filled 2011-06-11 (×4): qty 1

## 2011-06-11 MED ORDER — ASPIRIN 81 MG PO CHEW: 81.0000 mg | CHEWABLE_TABLET | Freq: Every day | ORAL | Status: DC

## 2011-06-11 MED ORDER — HYDROCODONE-ACETAMINOPHEN 10-325 MG PO TABS: 2.0000 | ORAL_TABLET | Freq: Four times a day (QID) | ORAL | Status: DC | PRN

## 2011-06-11 MED ORDER — ATENOLOL 25 MG PO TABS
50.0000 mg | ORAL_TABLET | Freq: Every day | ORAL | Status: DC
  Administered 2011-06-11 – 2011-06-14 (×4): 50 mg via ORAL
  Filled 2011-06-11 (×4): qty 2

## 2011-06-11 MED ORDER — ALBUTEROL SULFATE (5 MG/ML) 0.5% IN NEBU
2.5000 mg | INHALATION_SOLUTION | Freq: Four times a day (QID) | RESPIRATORY_TRACT | Status: DC
  Administered 2011-06-12 – 2011-06-14 (×10): 2.5 mg via RESPIRATORY_TRACT
  Filled 2011-06-11 (×10): qty 0.5

## 2011-06-11 MED ORDER — ENOXAPARIN SODIUM 40 MG/0.4ML ~~LOC~~ SOLN: 40.0000 mg | SUBCUTANEOUS | Status: DC

## 2011-06-11 MED ORDER — IPRATROPIUM BROMIDE 0.02 % IN SOLN
0.5000 mg | RESPIRATORY_TRACT | Status: DC
  Administered 2011-06-11: 0.5 mg via RESPIRATORY_TRACT
  Filled 2011-06-11: qty 2.5

## 2011-06-11 MED ORDER — ASPIRIN 81 MG PO CHEW
81.0000 mg | CHEWABLE_TABLET | Freq: Every day | ORAL | Status: DC
  Administered 2011-06-11 – 2011-06-14 (×4): 81 mg via ORAL
  Filled 2011-06-11 (×4): qty 1

## 2011-06-11 MED ORDER — PANTOPRAZOLE SODIUM 40 MG PO TBEC
40.0000 mg | DELAYED_RELEASE_TABLET | Freq: Every day | ORAL | Status: DC
  Administered 2011-06-11 – 2011-06-14 (×4): 40 mg via ORAL
  Filled 2011-06-11 (×4): qty 1

## 2011-06-11 MED ORDER — INSULIN ASPART 100 UNIT/ML ~~LOC~~ SOLN
0.0000 [IU] | Freq: Three times a day (TID) | SUBCUTANEOUS | Status: DC
  Administered 2011-06-12: 3 [IU] via SUBCUTANEOUS
  Administered 2011-06-13 – 2011-06-14 (×3): 2 [IU] via SUBCUTANEOUS

## 2011-06-11 MED ORDER — ISOSORBIDE MONONITRATE ER 60 MG PO TB24
60.0000 mg | ORAL_TABLET | Freq: Every day | ORAL | Status: DC
  Administered 2011-06-12 – 2011-06-14 (×3): 60 mg via ORAL
  Filled 2011-06-11 (×3): qty 1

## 2011-06-11 MED ORDER — FUROSEMIDE 10 MG/ML IJ SOLN
40.0000 mg | Freq: Two times a day (BID) | INTRAMUSCULAR | Status: DC
  Administered 2011-06-11 – 2011-06-14 (×6): 40 mg via INTRAVENOUS
  Filled 2011-06-11 (×6): qty 4

## 2011-06-11 MED ORDER — SIMVASTATIN 10 MG PO TABS
10.0000 mg | ORAL_TABLET | Freq: Every day | ORAL | Status: DC
  Administered 2011-06-11 – 2011-06-13 (×3): 10 mg via ORAL
  Filled 2011-06-11 (×3): qty 1

## 2011-06-11 NOTE — ED Notes (Signed)
Pt recently admitted and discharged yesterday for dehydration. Pt called out today for SOB per EMS. Pt speaks complete sentences. A/o x3. resp even/nonlabored. nad noted. Pt ambulated from stretcher to bed.

## 2011-06-11 NOTE — ED Notes (Signed)
Patient states he has been over medicated by his pharmacy, breathing slightly labored

## 2011-06-11 NOTE — H&P (Signed)
NAMECOOLIDGE, GOSSARD                ACCOUNT NO.:  [id number]  MEDICAL RECORD NO.:  [id number]  LOCATION:  A307                          FACILITY:  [facility]  PHYSICIAN:  Purcell Nails, MD DATE OF BIRTH:  15-Apr-1941  DATE OF ADMISSION:  06/11/2011 DATE OF DISCHARGE:  LH                             HISTORY & PHYSICAL   CHIEF COMPLAINT:  Shortness of breath.  HISTORY OF PRESENT ILLNESS:  This is a 71 year old Caucasian male with multiple medical problems including remote coronary artery disease, severe hypertension, obesity, type 2 diabetes, congestive heart failure. He comes to the emergency department with a complaint of moderate-to- severe shortness of breath, associated with nausea, vomiting, and decreased appetite, and generalized weakness.  He reports that he had these problems, came to the hospital, got admitted, and discharged 1 day prior to this presentation with almost similar complaints.  The patient did not feel better and returns to the ER with similar complaints.  The patient denies chest pain.  No fever.  No cough.  He underwent preliminary workup in the ER, which did show that his oxygen level was at 90%.  BNP was elevated at 1000.  Chest x-ray showed findings suggestive of congestive heart failure, he is granted  hospitalization. He does not use oxygen at home.  He did not comply with regular followup and treatment recommendations by his primary care doctor.  PAST MEDICAL HISTORY:  As above.  PAST SURGICAL HISTORY:  Include back surgery.  FAMILY HISTORY:  Noncontributory.  SOCIAL HISTORY:  Denies any smoking, alcohol, or drug use.  REVIEW OF SYSTEMS:  As in HPI, plus no bleeding, no headache, no injuries.  All others are reviewed and negative.  He is allergic to no known medications.  His home medications include albuterol, aspirin, atenolol 50 mg p.o. daily, benazepril 10 mg p.o. daily, Plavix 75 mg daily, diltiazem 120 mg p.o. daily, fluticasone and  salmeterol 1 puff b.i.d., glipizide 5 mg daily, hydrochlorothiazide 12.5 mg daily, hydrocodone and acetaminophen 1 tablet every 6 hours as needed, glargine 40 units at bedtime, Imdur 60 mg once a day, lisinopril 40 mg daily, methadone 10 mg daily, pantoprazole, paroxetine, Actos/metformin, pravastatin, tamsulosin, nitroglycerin.  PHYSICAL EXAMINATION:  GENERAL:  He is alert and oriented x3, no acute distress at this point, uses his oxygen per nasal cannula.  HEENT:  Puffy face and no JVD, no thyromegaly.  CHEST:  Significant for bilateral basal rales.  CARDIOVASCULAR:  Distant heart sounds.  No murmur.  No gallop.  ABDOMEN:  Obese, otherwise nontender.  EXTREMITIES:  Shows +2 pitting edema below knees.  CNS:  Nonfocal.  SKIN:  Has no rash.  No hyperemia.  His lab work shows AST and ALT normal.  Sodium at 139, potassium 4.0, chloride 101, bicarb 30, BUN 17, creatinine 1.34.  His troponin is less than 0.3 x2.  EKG was unremarkable, chronic left bundle bundle-branch block.  Chest x-ray shows mild CHF with moderate cardiomegaly and mild diffuse interstitial pulmonary edema.  ASSESSMENT: 1. Congestive heart failure exacerbation. 2. Severe hypertension with poorly pharmacy. 3. Type 2 diabetes. 4. Prior history of coronary artery disease. 5. Obesity. 6. Chronic obstructive pulmonary disease.  PLAN:  Admit for IV diuresis with Lasix.  Monitor I's and O's  strictly and daily weight.  Continue his home medications, prevent DVT by Lovenox 40 mg subcu daily.  Discontinue Actos/metformin, and glipizide. Initiate Lantus 30 units at bedtime and NovoLog 5 units t.i.d. a.c. plus moderate dose sliding scale.  Obtain A1c, TSH, free T4, and repeat troponin and EKG x1.  Provide with oxygen, nasal cannula to keep saturation above 90%.  The patient is counseled extensively regarding the need for him to follow up with Dr. Janna Arch for his outpatient management of CHF.  His care will be resumed  by Dr. Janna Arch in the morning. Interpretation.                                           ______________________________ Purcell Nails, MD     GN/MEDQ  D:  06/11/2011  T:  06/11/2011  Job:  [id number]

## 2011-06-11 NOTE — H&P (Signed)
  [id number] 

## 2011-06-11 NOTE — ED Provider Notes (Signed)
History   This chart was scribed for Bobby Lyons, MD by Clarita Crane. The patient was seen in room APA08/APA08 and the patient's care was started at 9:42AM.   CSN: [id number]  Arrival date & time 06/11/11  8295   First MD Initiated Contact with Patient 06/11/11 0940      Chief Complaint  Patient presents with  . Shortness of Breath    (Consider location/radiation/quality/duration/timing/severity/associated sxs/prior treatment) HPI [name] is a 71 y.o. male who presents to the Emergency Department complaining of constant moderate to severe SOB with associated nausea, vomiting, decreased appetite and generalized weakness onset several days ago and persistent since. Patient notes he was d/c yesterday from hospital after admission for 1 day for similar symptoms he is currently experiencing. Patient states he was not d/c home with medications. Patient with h/o Kidney CA, CAD, HTN and COPD. Denies home O2 use.  PCP- Janna Arch  Past Medical History  Diagnosis Date  . Coronary artery disease   . Hypertension   . Cancer     Past Surgical History  Procedure Date  . Back surgery     No family history on file.  History  Substance Use Topics  . Smoking status: Former Games developer  . Smokeless tobacco: Not on file  . Alcohol Use: No      Review of Systems 10 Systems reviewed and are negative for acute change except as noted in the HPI.  Allergies  Review of patient's allergies indicates no known allergies.  Home Medications   Current Outpatient Rx  Name Route Sig Dispense Refill  . ALBUTEROL SULFATE HFA 108 (90 BASE) MCG/ACT IN AERS Inhalation Inhale 2 puffs into the lungs every 6 (six) hours as needed. Shortness of breath     . ASPIRIN EC 81 MG PO TBEC Oral Take 81 mg by mouth daily.      . ATENOLOL 50 MG PO TABS Oral Take 50 mg by mouth daily.      Marland Kitchen BENAZEPRIL HCL 10 MG PO TABS Oral Take 1 tablet (10 mg total) by mouth daily. 30 tablet 6  . CLOPIDOGREL BISULFATE 75 MG  PO TABS Oral Take 75 mg by mouth daily.      Marland Kitchen DILTIAZEM HCL ER 120 MG PO CP12 Oral Take 120 mg by mouth daily.      Marland Kitchen FLUTICASONE-SALMETEROL 250-50 MCG/DOSE IN AEPB Inhalation Inhale 1 puff into the lungs every 12 (twelve) hours.      Marland Kitchen GLIPIZIDE 5 MG PO TABS Oral Take 1 tablet (5 mg total) by mouth daily before breakfast. 30 tablet 5  . HYDROCHLOROTHIAZIDE 12.5 MG PO CAPS Oral Take 12.5 mg by mouth daily.      Marland Kitchen HYDROCODONE-ACETAMINOPHEN 10-500 MG PO TABS Oral Take 1 tablet by mouth every 6 (six) hours as needed. Pain      . INSULIN GLARGINE 100 UNIT/ML  SOLN Subcutaneous Inject 40 Units into the skin at bedtime.      . ISOSORBIDE MONONITRATE ER 60 MG PO TB24 Oral Take 60 mg by mouth daily.      Marland Kitchen LISINOPRIL 40 MG PO TABS Oral Take 40 mg by mouth daily.      Marland Kitchen METHADONE HCL 10 MG PO TABS Oral Take 20 mg by mouth 2 (two) times daily.      Marland Kitchen PANTOPRAZOLE SODIUM 40 MG PO TBEC Oral Take 40 mg by mouth daily.      Marland Kitchen PAROXETINE HCL 10 MG PO TABS Oral Take 2 tablets (20 mg  total) by mouth daily. 30 tablet 3  . PIOGLITAZONE HCL-METFORMIN HCL 15-500 MG PO TABS Oral Take 1 tablet by mouth 2 (two) times daily with a meal.      . PRAVASTATIN SODIUM 40 MG PO TABS Oral Take 40 mg by mouth daily.      Marland Kitchen TAMSULOSIN HCL 0.4 MG PO CAPS Oral Take 1 capsule (0.4 mg total) by mouth daily. 30 capsule 3  . NITROGLYCERIN 0.4 MG/HR TD PT24 Transdermal Place 1 patch onto the skin daily.      Marland Kitchen NITROGLYCERIN 0.4 MG SL SUBL Sublingual Place 0.4 mg under the tongue every 5 (five) minutes as needed. Chest pain       BP 167/76  Pulse 99  Temp(Src) 97.9 F (36.6 C) (Oral)  Resp 20  Ht 5\' 7"  (1.702 m)  Wt 252 lb (114.306 kg)  BMI 39.47 kg/m2  SpO2 97%  Physical Exam  Nursing note and vitals reviewed. Constitutional: He is oriented to person, place, and time. He appears well-developed and well-nourished. No distress.  HENT:  Head: Normocephalic and atraumatic.  Eyes: EOM are normal. Pupils are equal, round, and  reactive to light.  Neck: Neck supple. No tracheal deviation present.  Cardiovascular: Normal rate and regular rhythm.  Exam reveals no gallop and no friction rub.   No murmur heard. Pulmonary/Chest: Effort normal. No respiratory distress. He has no wheezes. He has no rales.  Abdominal: Soft. He exhibits no distension.  Musculoskeletal: Normal range of motion. He exhibits edema (1+ pitting of lower extremities.).  Neurological: He is alert and oriented to person, place, and time. No sensory deficit.  Skin: Skin is warm and dry.  Psychiatric: He has a normal mood and affect. His behavior is normal.    ED Course  Procedures (including critical care time)  DIAGNOSTIC STUDIES: Oxygen Saturation is 97% on room air, normal by my interpretation.    COORDINATION OF CARE:    Labs Reviewed  CBC - Abnormal; Notable for the following:    Hemoglobin 12.8 (*)    All other components within normal limits  COMPREHENSIVE METABOLIC PANEL - Abnormal; Notable for the following:    Glucose, Bld 110 (*)    Albumin 3.4 (*)    GFR calc non Af Amer 53 (*)    GFR calc Af Amer 61 (*)    All other components within normal limits  PRO B NATRIURETIC PEPTIDE - Abnormal; Notable for the following:    Pro B Natriuretic peptide (BNP) 1026.0 (*)    All other components within normal limits  URINALYSIS, ROUTINE W REFLEX MICROSCOPIC - Abnormal; Notable for the following:    Specific Gravity, Urine >1.030 (*)    Hgb urine dipstick TRACE (*)    All other components within normal limits  DIFFERENTIAL  CARDIAC PANEL(CRET KIN+CKTOT+MB+TROPI)  URINE MICROSCOPIC-ADD ON   Dg Chest Port 1 View  06/11/2011  *RADIOLOGY REPORT*  Clinical Data: Shortness of breath.  PORTABLE CHEST - 1 VIEW 06/11/2011 1004 hours:  Comparison: Two-view chest x-ray 02/08/2011, 02/24/2010 and portable chest x-ray 04/16/2008 [facility].  Findings: Cardiac silhouette moderately enlarged but stable.  Mild diffuse interstitial pulmonary  edema.  No confluent airspace consolidation.  No visible pleural effusions.  IMPRESSION: Mild CHF with moderate cardiomegaly and mild diffuse interstitial pulmonary edema.  Original Report Authenticated By: Arnell Sieving, M.D.     No diagnosis found.   Date: 06/11/2011  Rate: 82  Rhythm: normal sinus rhythm  QRS Axis: left  Intervals:  normal  ST/T Wave abnormalities: normal  Conduction Disutrbances:left bundle branch block  Narrative Interpretation:   Old EKG Reviewed: unchanged    MDM  Labs, ekg consistent with chf exacerbation.  No elevation of cardiac enzymes.  Patient is hypoxic, will admit.        I personally performed the services described in this documentation, which was scribed in my presence. The recorded information has been reviewed and considered.     Bobby Lyons, MD 06/11/11 1258

## 2011-06-11 NOTE — ED Notes (Signed)
Refused foley, states he is able to go to the bathroom

## 2011-06-12 LAB — HEMOGLOBIN A1C
Hgb A1c MFr Bld: 6.7 % — ABNORMAL HIGH (ref <5.7)
Mean Plasma Glucose: 146 mg/dL — ABNORMAL HIGH (ref <117)

## 2011-06-12 LAB — CARDIAC PANEL(CRET KIN+CKTOT+MB+TROPI)
Relative Index: 1.9 (ref 0.0–2.5)
Total CK: 128 U/L (ref 7–232)

## 2011-06-12 LAB — GLUCOSE, CAPILLARY
Glucose-Capillary: 103 mg/dL — ABNORMAL HIGH (ref 70–99)
Glucose-Capillary: 184 mg/dL — ABNORMAL HIGH (ref 70–99)
Glucose-Capillary: 116 mg/dL — ABNORMAL HIGH (ref 70–99)

## 2011-06-12 LAB — COMPREHENSIVE METABOLIC PANEL
ALT: 12 U/L (ref 0–53)
Albumin: 3.1 g/dL — ABNORMAL LOW (ref 3.5–5.2)
Alkaline Phosphatase: 61 U/L (ref 39–117)
Calcium: 9.3 mg/dL (ref 8.4–10.5)
GFR calc Af Amer: 50 mL/min — ABNORMAL LOW (ref >90)
Glucose, Bld: 63 mg/dL — ABNORMAL LOW (ref 70–99)
Potassium: 4.2 mEq/L (ref 3.5–5.1)
Sodium: 139 mEq/L (ref 135–145)
Total Protein: 6.7 g/dL (ref 6.0–8.3)

## 2011-06-12 LAB — TSH: TSH: 1.952 u[IU]/mL (ref 0.350–4.500)

## 2011-06-12 MED ORDER — DILTIAZEM HCL ER COATED BEADS 120 MG PO CP24
120.0000 mg | ORAL_CAPSULE | Freq: Every day | ORAL | Status: DC
  Administered 2011-06-12 – 2011-06-14 (×3): 120 mg via ORAL
  Filled 2011-06-12 (×3): qty 1

## 2011-06-12 MED ORDER — ENOXAPARIN SODIUM 40 MG/0.4ML ~~LOC~~ SOLN
40.0000 mg | Freq: Every day | SUBCUTANEOUS | Status: DC
  Administered 2011-06-12 – 2011-06-14 (×3): 40 mg via SUBCUTANEOUS
  Filled 2011-06-12 (×3): qty 0.4

## 2011-06-12 NOTE — Progress Notes (Signed)
Inpatient Diabetes Program Recommendations  AACE/ADA: New Consensus Statement on Inpatient Glycemic Control (2009)  Target Ranges:  Prepandial:   less than 140 mg/dL      Peak postprandial:   less than 180 mg/dL (1-2 hours)      Critically ill patients:  140 - 180 mg/dL   Reason for Visit: Hypoglycemia  63 mg/dL this am  Inpatient Diabetes Program Recommendations Insulin - Basal: Decrease Lantus to 25 units qhs

## 2011-06-12 NOTE — Progress Notes (Signed)
[id number] 

## 2011-06-12 NOTE — Progress Notes (Signed)
*  PRELIMINARY RESULTS* Echocardiogram 2D Echocardiogram has been performed.  Conrad Campbell 06/12/2011, 3:12 [id number]

## 2011-06-12 NOTE — Progress Notes (Signed)
Woke patient up to administer morning meds. Patient stated that he felt shaky like his blood sugar had dropped. CBG 63. Orange juice and graham crackers given. Will recheck CBG around 7am.

## 2011-06-13 LAB — GLUCOSE, CAPILLARY
Glucose-Capillary: 120 mg/dL — ABNORMAL HIGH (ref 70–99)
Glucose-Capillary: 145 mg/dL — ABNORMAL HIGH (ref 70–99)
Glucose-Capillary: 108 mg/dL — ABNORMAL HIGH (ref 70–99)

## 2011-06-13 LAB — BASIC METABOLIC PANEL
CO2: 37 mEq/L — ABNORMAL HIGH (ref 19–32)
Chloride: 95 mEq/L — ABNORMAL LOW (ref 96–112)
GFR calc Af Amer: 54 mL/min — ABNORMAL LOW (ref >90)
Potassium: 3.8 mEq/L (ref 3.5–5.1)
Sodium: 140 mEq/L (ref 135–145)

## 2011-06-13 NOTE — Progress Notes (Signed)
[id number] 

## 2011-06-13 NOTE — Plan of Care (Signed)
Problem: Phase I Progression Outcomes Goal: EF % per last Echo/documented,Core Reminder form on chart Outcome: Completed/Met Date Met:  06/13/11 55-60%

## 2011-06-13 NOTE — Progress Notes (Signed)
CARE MANAGEMENT NOTE 06/13/2011  Patient:  BROCKTON, MCKESSON   Account Number:  [id number]  Date Initiated:  06/13/2011  Documentation initiated by:  Rosemary Holms  Subjective/Objective Assessment:   Pt admitted with CHF/COPD. PTA, pt lived alone w/ minimum family support.     Action/Plan:   CM spoke to pt at bedside. Pt stated that he could use a HH RN to assess his progress. He chose [facility] if [facility] RN ordered  .   Anticipated DC Date:  06/15/2011   Anticipated DC Plan:  HOME W HOME HEALTH SERVICES      DC Planning Services  CM consult      Choice offered to / List presented to:  C-1 Patient           Status of service:  In process, will continue to follow Medicare Important Message given?   (If response is "NO", the following Medicare IM given date fields will be blank) Date Medicare IM given:   Date Additional Medicare IM given:    Discharge Disposition:    Per UR Regulation:    Comments:  06/13/11 1100 Snyder Colavito  Leanord Hawking RN BSN CM

## 2011-06-13 NOTE — Progress Notes (Signed)
NAMEELMAR, [name]                ACCOUNT NO.:  [id number]  MEDICAL RECORD NO.:  [id number]  LOCATION:  A307                          FACILITY:  [facility]  PHYSICIAN:  Melvyn Novas, MDDATE OF BIRTH:  September 26, 1940  DATE OF PROCEDURE: DATE OF DISCHARGE:                                PROGRESS NOTE   The patient was recently discharged with epigastric pain that resolves if bringing on clear liquids.  No elevation of leg pains.  He has hypertension, hyperlipidemia, coronary artery disease, and insulin- dependent diabetes.  Creatinine at 1.58.  Potassium within normal limits.  Glycemia control excellent.  Blood pressure is 124/70, temperature 97.7, pulse is 81 and regular, respiratory rate is 20.  The patient also has a normal sinus rhythm with a left bundle-branch block pattern per EKG.  Neck shows no JVD.  Lungs show diminished breath sounds at bases.  No rales, wheeze, or rhonchi appreciable.  Heart regular rhythm.  No S3 or S4.  Plan right now is to get cardiac enzymes. BMET daily x3.  We will get 2D echocardiogram to assess systolic and diastolic function, chamber dimensions with a history of CHF on chest x- ray consistent with some mild CHF.  We will continue the patient on his current medicines.  Monitor glycemia control, and make further recommendations.     Melvyn Novas, MD     RMD/MEDQ  D:  06/12/2011  T:  06/12/2011  Job:  [id number]

## 2011-06-13 NOTE — Progress Notes (Signed)
Utilization review completed.  

## 2011-06-13 NOTE — Progress Notes (Signed)
NAMEJESSI, JESSOP                ACCOUNT NO.:  [id number]  MEDICAL RECORD NO.:  [id number]  LOCATION:  A307                          FACILITY:  [facility]  PHYSICIAN:  Melvyn Novas, MDDATE OF BIRTH:  [birth year]  DATE OF PROCEDURE: DATE OF DISCHARGE:                                PROGRESS NOTE   The patient has history of coronary artery disease status post stenting x3, hyperlipidemia, hypertension, COPD, totally illiterate, chronic noncompliance, hypertension.  The patient was admitted with elevated BNP, however 2D echocardiogram done yesterday reveals technically limited study, however, some mild LVH and normal systolic function, ejection fraction of 55-60%.  PHYSICAL EXAMINATION:  VITAL SIGNS:  Blood pressure 118/64, temperature 98.5, pulse is 71 and regular, respiratory rate is 20. LUNGS:  Clear with prolonged expiratory phase, scattered rhonchi, otherwise clear. HEART:  Regular rhythm.  No S3, S4, gallops, heaves, thrills, or rubs. ABDOMEN:  Obese, soft, nontender.  Bowel sounds normoactive.  No guarding, rebound, mass, or organomegaly.  Creatinine improved to 1.58-1.49, potassium 3.8, WBC 7.4, hemoglobin 12.8.  Plan right now is to get CBC, lipase, magnesium level.  Monitor hemodynamics, continue the same.  If patient remains symptomatic, we will consider discharge in 48 hours.     Melvyn Novas, MD     RMD/MEDQ  D:  06/13/2011  T:  06/13/2011  Job:  [id number]

## 2011-06-14 LAB — HEPATIC FUNCTION PANEL
AST: 10 U/L (ref 0–37)
Albumin: 3.2 g/dL — ABNORMAL LOW (ref 3.5–5.2)
Alkaline Phosphatase: 59 U/L (ref 39–117)
Total Bilirubin: 0.3 mg/dL (ref 0.3–1.2)
Total Protein: 7.1 g/dL (ref 6.0–8.3)

## 2011-06-14 LAB — BASIC METABOLIC PANEL
BUN: 27 mg/dL — ABNORMAL HIGH (ref 6–23)
Chloride: 95 mEq/L — ABNORMAL LOW (ref 96–112)
GFR calc non Af Amer: 40 mL/min — ABNORMAL LOW (ref >90)
Glucose, Bld: 87 mg/dL (ref 70–99)
Potassium: 3.3 mEq/L — ABNORMAL LOW (ref 3.5–5.1)
Sodium: 141 mEq/L (ref 135–145)

## 2011-06-14 LAB — GLUCOSE, CAPILLARY: Glucose-Capillary: 125 mg/dL — ABNORMAL HIGH (ref 70–99)

## 2011-06-14 MED ORDER — METFORMIN HCL 500 MG PO TABS: 500.0000 mg | ORAL_TABLET | Freq: Two times a day (BID) | ORAL | Status: DC

## 2011-06-14 MED ORDER — LISINOPRIL 40 MG PO TABS: 20.0000 mg | ORAL_TABLET | Freq: Every day | ORAL | Status: DC

## 2011-06-14 NOTE — Progress Notes (Signed)
CARE MANAGEMENT NOTE 06/14/2011  Patient:  [name], [name]   Account Number:  [id number]  Date Initiated:  06/13/2011  Documentation initiated by:  Rosemary Holms  Subjective/Objective Assessment:   Pt admitted with CHF/COPD. PTA, pt lived alone w/ minimum family support.     Action/Plan:   CM spoke to pt at bedside. Pt stated that he could use a HH RN to assess his progress. He chose [facility] if [facility] RN ordered  .   Anticipated DC Date:  06/14/2011   Anticipated DC Plan:  HOME W HOME HEALTH SERVICES      DC Planning Services  CM consult      Choice offered to / List presented to:  C-1 Patient        HH arranged  HH-1 RN      [facility] agency  [facility]   Status of service:  Completed, signed off Medicare Important Message given?   (If response is "NO", the following Medicare IM given date fields will be blank) Date Medicare IM given:   Date Additional Medicare IM given:    Discharge Disposition:  HOME W HOME HEALTH SERVICES  Per UR Regulation:    Comments:  06/14/11 1300 Karlita Lichtman Leanord Hawking RN BSN CM Pt discharged with [facility] RN with [facility].  06/13/11 1100 Cher Franzoni  Leanord Hawking RN BSN CM

## 2011-06-14 NOTE — Discharge Summary (Signed)
[id number] 

## 2011-06-14 NOTE — Progress Notes (Signed)
Pt discharged home today per Dr. Janna Arch. Pt's VS stable at this time. Pt's IV site D/C'd and WNL. Pt provided with home medication list, discharge instructions, prescriptions, Living with Heart Failure Packet, as well as digital scale for home use. Pt verbalized understanding. Pt made aware of the need to call on Monday morning to Dr. Otilio Saber office in order to make a follow up appointment. Pt verbalized understanding. Pt left floor via WC in stable condition accompanied by NT. Dagoberto Ligas, RN

## 2011-06-14 NOTE — Discharge Summary (Signed)
[name], [name]                ACCOUNT NO.:  [id number]  MEDICAL RECORD NO.:  [id number]  LOCATION:  A307                          FACILITY:  [facility]  PHYSICIAN:  Melvyn Novas, MDDATE OF BIRTH:  [birth year]  DATE OF ADMISSION:  06/11/2011 DATE OF DISCHARGE:  01/04/2013LH                              DISCHARGE SUMMARY   The patient had two admissions for recurrent nausea and vomiting in hospital.  Lipase has always remained normal.  The vomiting has subsided within 24 hours of admission, on clear liquids.  He had no further nausea, and tolerated fluids in the last 48 hours in hospital.  He has a history of chronic noncompliance, total illiteracy, COPD, hypertension, hyperlipidemia, coronary artery disease, status post stenting x3, type 2 diabetes, and hyperlipidemia.  The patient had no complaints of angina or anginal equivalents.  Chest x-ray was consistent with some degree of congestive heart failure.  Upon admission, however, 2D echocardiogram on this admission revealed normal systolic function, ejection fraction between 55-60%, otherwise technically limited study.  His glycemic control was good in hospital.  He was hemodynamically stable.  Blood pressure was 124/73, pulse was 70 and regular.  On average, there was no respiratory distress.  No angina or anginal equivalents while in hospital.  The patient was subsequently discharged on the following medicines: 1. Metformin 500 mg p.o. b.i.d. 2. Aspirin 81 mg p.o. daily. 3. Tenormin 50 mg p.o. daily. 4. Plavix 75 mg p.o. daily. 5. Diltiazem 120 p.o. daily. 6. Glipizide 5 mg p.o. daily. 7. Lantus insulin 40 units h.s. 8. Lortab 10/500 p.o. t.i.d. p.r.n. 9. Lisinopril 40 mg p.o. daily. 10.Nitro-Dur 0.4 mg/hour. 11.Nitrostat 0.4 mg sublingual p.r.n. 12.Protonix 40 mg p.o. daily. 13.Paxil 10 mg p.o. daily. 14.Pravachol 40 mg p.o. daily. 15.Flomax 0.4 mg p.o. daily.  The patient was urged to eat low-volume food,  low-fat and low- carbohydrate food, take his insulin and monitor his glucoses, and follow up in the office within 1 week's time.     Melvyn Novas, MD     RMD/MEDQ  D:  06/14/2011  T:  06/14/2011  Job:  [id number]

## 2011-06-27 NOTE — Discharge Summary (Signed)
[name], [name]                ACCOUNT NO.:  [id number]  MEDICAL RECORD NO.:  [id number]  LOCATION:  A307                          FACILITY:  [facility]  PHYSICIAN:  Melvyn Novas, MDDATE OF BIRTH:  18-Jul-1940  DATE OF ADMISSION:  06/11/2011 DATE OF DISCHARGE:  01/04/2013LH                              DISCHARGE SUMMARY   ADDENDUM:  Lisinopril dosage on admission is 20 mg p.o. daily.  The methadone is to be discontinued.  The patient is to take Imdur 30 mg p.o. daily on discharge and the patient is to discontinue Lortab.  The patient was discharged home on the following medications and the patient is aware.     Melvyn Novas, MD     RMD/MEDQ  D:  06/27/2011  T:  06/27/2011  Job:  [id number]

## 2011-12-15 ENCOUNTER — Emergency Department (HOSPITAL_COMMUNITY): Payer: Medicare Other

## 2011-12-15 ENCOUNTER — Encounter (HOSPITAL_COMMUNITY): Payer: Self-pay | Admitting: *Deleted

## 2011-12-15 ENCOUNTER — Emergency Department (HOSPITAL_COMMUNITY)
Admission: EM | Admit: 2011-12-15 | Discharge: 2011-12-15 | Disposition: A | Discharge status: Home / Self Care | Payer: Medicare Other | Attending: Emergency Medicine | Admitting: Emergency Medicine

## 2011-12-15 DIAGNOSIS — E119 Type 2 diabetes mellitus without complications: Secondary | ICD-10-CM | POA: Insufficient documentation

## 2011-12-15 DIAGNOSIS — I509 Heart failure, unspecified: Secondary | ICD-10-CM | POA: Insufficient documentation

## 2011-12-15 DIAGNOSIS — Z79899 Other long term (current) drug therapy: Secondary | ICD-10-CM | POA: Insufficient documentation

## 2011-12-15 DIAGNOSIS — R112 Nausea with vomiting, unspecified: Secondary | ICD-10-CM | POA: Insufficient documentation

## 2011-12-15 DIAGNOSIS — R142 Eructation: Secondary | ICD-10-CM | POA: Insufficient documentation

## 2011-12-15 DIAGNOSIS — H9209 Otalgia, unspecified ear: Secondary | ICD-10-CM | POA: Insufficient documentation

## 2011-12-15 DIAGNOSIS — Z794 Long term (current) use of insulin: Secondary | ICD-10-CM | POA: Insufficient documentation

## 2011-12-15 DIAGNOSIS — I2510 Atherosclerotic heart disease of native coronary artery without angina pectoris: Secondary | ICD-10-CM | POA: Insufficient documentation

## 2011-12-15 DIAGNOSIS — E669 Obesity, unspecified: Secondary | ICD-10-CM | POA: Insufficient documentation

## 2011-12-15 DIAGNOSIS — R141 Gas pain: Secondary | ICD-10-CM | POA: Insufficient documentation

## 2011-12-15 DIAGNOSIS — I10 Essential (primary) hypertension: Secondary | ICD-10-CM | POA: Insufficient documentation

## 2011-12-15 DIAGNOSIS — Z7982 Long term (current) use of aspirin: Secondary | ICD-10-CM | POA: Insufficient documentation

## 2011-12-15 DIAGNOSIS — E7800 Pure hypercholesterolemia, unspecified: Secondary | ICD-10-CM | POA: Insufficient documentation

## 2011-12-15 DIAGNOSIS — R55 Syncope and collapse: Secondary | ICD-10-CM | POA: Insufficient documentation

## 2011-12-15 DIAGNOSIS — L989 Disorder of the skin and subcutaneous tissue, unspecified: Secondary | ICD-10-CM | POA: Insufficient documentation

## 2011-12-15 DIAGNOSIS — R739 Hyperglycemia, unspecified: Secondary | ICD-10-CM

## 2011-12-15 HISTORY — DX: Inflammatory liver disease, unspecified: K75.9

## 2011-12-15 LAB — URINALYSIS, ROUTINE W REFLEX MICROSCOPIC
Bilirubin Urine: NEGATIVE
Glucose, UA: >1000 mg/dL — AB
Ketones, ur: NEGATIVE mg/dL
Leukocytes, UA: NEGATIVE
Protein, ur: NEGATIVE mg/dL

## 2011-12-15 LAB — CBC WITH DIFFERENTIAL/PLATELET
Basophils Relative: 1 % (ref 0–1)
Hemoglobin: 12.2 g/dL — ABNORMAL LOW (ref 13.0–17.0)
Lymphs Abs: 1.2 10*3/uL (ref 0.7–4.0)
Monocytes Relative: 7 % (ref 3–12)
Neutro Abs: 3.8 10*3/uL (ref 1.7–7.7)
Neutrophils Relative %: 67 % (ref 43–77)
RBC: 4.87 MIL/uL (ref 4.22–5.81)

## 2011-12-15 LAB — URINE MICROSCOPIC-ADD ON: RBC / HPF: NONE SEEN RBC/hpf (ref <3)

## 2011-12-15 LAB — COMPREHENSIVE METABOLIC PANEL
Albumin: 3.2 g/dL — ABNORMAL LOW (ref 3.5–5.2)
Alkaline Phosphatase: 79 U/L (ref 39–117)
BUN: 24 mg/dL — ABNORMAL HIGH (ref 6–23)
Chloride: 100 mEq/L (ref 96–112)
Glucose, Bld: 511 mg/dL — ABNORMAL HIGH (ref 70–99)
Potassium: 5.1 mEq/L (ref 3.5–5.1)
Total Bilirubin: 0.2 mg/dL — ABNORMAL LOW (ref 0.3–1.2)

## 2011-12-15 LAB — CARDIAC PANEL(CRET KIN+CKTOT+MB+TROPI): Relative Index: INVALID (ref 0.0–2.5)

## 2011-12-15 LAB — GLUCOSE, CAPILLARY
Glucose-Capillary: 480 mg/dL — ABNORMAL HIGH (ref 70–99)
Glucose-Capillary: 414 mg/dL — ABNORMAL HIGH (ref 70–99)

## 2011-12-15 MED ORDER — SODIUM CHLORIDE 0.9 % IV SOLN
Freq: Once | INTRAVENOUS | Status: AC
  Administered 2011-12-15: 09:00:00 via INTRAVENOUS

## 2011-12-15 MED ORDER — ONDANSETRON HCL 4 MG/2ML IJ SOLN
4.0000 mg | Freq: Once | INTRAMUSCULAR | Status: AC
  Administered 2011-12-15: 4 mg via INTRAVENOUS

## 2011-12-15 MED ORDER — ONDANSETRON HCL 4 MG/2ML IJ SOLN
INTRAMUSCULAR | Status: AC
  Filled 2011-12-15: qty 2

## 2011-12-15 MED ORDER — SODIUM CHLORIDE 0.9 % IV SOLN
INTRAVENOUS | Status: DC
  Filled 2011-12-15: qty 1

## 2011-12-15 NOTE — ED Notes (Signed)
Pt c/o n/v and hyperglycemia. Pt states he has been sick for aprox a week with worsening sx this am.

## 2011-12-15 NOTE — ED Provider Notes (Signed)
History    This chart was scribed for Osvaldo Human, MD, MD by Smitty Pluck. The patient was seen in room APA06 and the patient's care was started at 8:17AM.   CSN: [id number]  Arrival date & time 12/15/11  0721   First MD Initiated Contact with Patient 12/15/11 [phone]      Chief Complaint  Patient presents with  . Emesis  . Hyperglycemia    (Consider location/radiation/quality/duration/timing/severity/associated sxs/prior treatment) The history is provided by the patient.   [name] is a 71 y.o. male who presents to the Emergency Department complaining of syncope 1 week ago while sitting in his car. He thinks that he might have been hypoglycemic. He reports that he had some orange juice after syncope and felt better. Pt reports having nausea and vomiting. Pt reports he might have had a fever. He states that he always has earache. Denies cough, chest pain, diarrhea, difficulty urinating. Pt reports having bumps bilaterally on arms. Pt reports having diabetes and checking glucose today 587. Pt reports that he has not taken medication today. Denies smoking cigarettes and drinking alcohol. Nausea has been constant since onset. There is no radiation.  PCP is Dr. Janna Arch (seen 4-5 months ago)  Past Medical History  Diagnosis Date  . Coronary artery disease   . Hypertension   . Cancer   . Pneumonia   . CHF (congestive heart failure)   . High cholesterol   . Diabetes mellitus     Past Surgical History  Procedure Date  . Back surgery     History reviewed. No pertinent family history.  History  Substance Use Topics  . Smoking status: Former Games developer  . Smokeless tobacco: Not on file  . Alcohol Use: No      Review of Systems  Constitutional: Negative for fever and chills.  HENT: Positive for ear pain (He has chronic ear pain.).        Mouth is dry.   Eyes: Negative.   Respiratory: Negative.  Negative for cough.   Cardiovascular: Negative.   Gastrointestinal:  Positive for nausea and vomiting. Negative for diarrhea.  Genitourinary: Negative.   Musculoskeletal: Negative.   Skin:       "Age bumps" on arms.  Neurological: Positive for syncope.       Had episode of syncope last week.  Drank orange juice and felt better.  Did not seek medical evaluation then, but has felt sick since that episode.  Psychiatric/Behavioral: Negative.   All other systems reviewed and are negative.  10 Systems reviewed and all are negative for acute change except as noted in the HPI.   Allergies  Review of patient's allergies indicates no known allergies.  Home Medications   Current Outpatient Rx  Name Route Sig Dispense Refill  . ASPIRIN EC 81 MG PO TBEC Oral Take 81 mg by mouth daily.      . ATENOLOL 50 MG PO TABS Oral Take 50 mg by mouth daily.      Marland Kitchen CLOPIDOGREL BISULFATE 75 MG PO TABS Oral Take 75 mg by mouth daily.      Marland Kitchen DILTIAZEM HCL ER BEADS 120 MG PO CP24 Oral Take 120 mg by mouth daily.      Marland Kitchen GLIPIZIDE 5 MG PO TABS Oral Take 1 tablet (5 mg total) by mouth daily before breakfast. 30 tablet 5  . INSULIN GLARGINE 100 UNIT/ML [place] SOLN Subcutaneous Inject 40 Units into the skin at bedtime.      Malachy Mood  MONONITRATE ER 60 MG PO TB24 Oral Take 60 mg by mouth daily.      Marland Kitchen LISINOPRIL 40 MG PO TABS Oral Take 0.5 tablets (20 mg total) by mouth daily. 30 tablet 2  . METFORMIN HCL 500 MG PO TABS Oral Take 1 tablet (500 mg total) by mouth 2 (two) times daily with a meal. 60 tablet 3  . METHADONE HCL 10 MG PO TABS Oral Take 20 mg by mouth 2 (two) times daily.      Marland Kitchen NITROGLYCERIN 0.4 MG/HR TD PT24 Transdermal Place 1 patch onto the skin daily.      Marland Kitchen NITROGLYCERIN 0.4 MG SL SUBL Sublingual Place 0.4 mg under the tongue every 5 (five) minutes as needed. Chest pain     . PANTOPRAZOLE SODIUM 40 MG PO TBEC Oral Take 40 mg by mouth daily.      Marland Kitchen PAROXETINE HCL 10 MG PO TABS Oral Take 2 tablets (20 mg total) by mouth daily. 30 tablet 3  . PRAVASTATIN SODIUM 40 MG PO  TABS Oral Take 40 mg by mouth daily.      Marland Kitchen TAMSULOSIN HCL 0.4 MG PO CAPS Oral Take 1 capsule (0.4 mg total) by mouth daily. 30 capsule 3    BP 157/76  Pulse 87  Temp 98.1 F (36.7 C) (Oral)  Resp 16  Ht 5' 7.5" (1.715 m)  Wt 252 lb (114.306 kg)  BMI 38.89 kg/m2  SpO2 95%  Physical Exam  Nursing note and vitals reviewed. Constitutional: He is oriented to person, place, and time. He appears well-developed and well-nourished. No distress.       Obese   HENT:  Head: Normocephalic and atraumatic.       Tongue dry with coating  Eyes: Conjunctivae are normal. Pupils are equal, round, and reactive to light.  Neck: Normal range of motion. Neck supple.  Cardiovascular: Normal rate, regular rhythm and normal heart sounds.   Pulmonary/Chest: Effort normal. No respiratory distress.  Abdominal: He exhibits distension. He exhibits no mass. Bowel sounds are increased. There is no rebound and no guarding.  Neurological: He is alert and oriented to person, place, and time.  Skin: Skin is warm and dry.  Psychiatric: He has a normal mood and affect. His behavior is normal.    ED Course  Procedures (including critical care time) DIAGNOSTIC STUDIES: Oxygen Saturation is 95% on room air, normal by my interpretation.    COORDINATION OF CARE: 8:24AM EDP discusses pt ED treatment with pt. 8:29AM EDP ordered medication: 0.9% NaCl infusion, Zofran 4 mg  Results for orders placed during the hospital encounter of 12/15/11  CBC WITH DIFFERENTIAL      Component Value Range   WBC 5.7  4.0 - 10.5 K/uL   RBC 4.87  4.22 - 5.81 MIL/uL   Hemoglobin 12.2 (*) 13.0 - 17.0 g/dL   HCT 16.1 (*) 09.6 - 04.5 %   MCV 77.4 (*) 78.0 - 100.0 fL   MCH 25.1 (*) 26.0 - 34.0 pg   MCHC 32.4  30.0 - 36.0 g/dL   RDW 40.9  81.1 - 91.4 %   Platelets 173  150 - 400 K/uL   Neutrophils Relative 67  43 - 77 %   Neutro Abs 3.8  1.7 - 7.7 K/uL   Lymphocytes Relative 21  12 - 46 %   Lymphs Abs 1.2  0.7 - 4.0 K/uL    Monocytes Relative 7  3 - 12 %   Monocytes Absolute 0.4  0.1 -  1.0 K/uL   Eosinophils Relative 4  0 - 5 %   Eosinophils Absolute 0.2  0.0 - 0.7 K/uL   Basophils Relative 1  0 - 1 %   Basophils Absolute 0.0  0.0 - 0.1 K/uL  COMPREHENSIVE METABOLIC PANEL      Component Value Range   Sodium 132 (*) 135 - 145 mEq/L   Potassium 5.1  3.5 - 5.1 mEq/L   Chloride 100  96 - 112 mEq/L   CO2 25  19 - 32 mEq/L   Glucose, Bld 511 (*) 70 - 99 mg/dL   BUN 24 (*) 6 - 23 mg/dL   Creatinine, Ser 0.45 (*) 0.50 - 1.35 mg/dL   Calcium 8.4  8.4 - 40.9 mg/dL   Total Protein 6.1  6.0 - 8.3 g/dL   Albumin 3.2 (*) 3.5 - 5.2 g/dL   AST 12  0 - 37 U/L   ALT 18  0 - 53 U/L   Alkaline Phosphatase 79  39 - 117 U/L   Total Bilirubin 0.2 (*) 0.3 - 1.2 mg/dL   GFR calc non Af Amer 46 (*) >90 mL/min   GFR calc Af Amer 54 (*) >90 mL/min  URINALYSIS, ROUTINE W REFLEX MICROSCOPIC      Component Value Range   Color, Urine YELLOW  YELLOW   APPearance CLEAR  CLEAR   Specific Gravity, Urine 1.010  1.005 - 1.030   pH 6.0  5.0 - 8.0   Glucose, UA >1000 (*) NEGATIVE mg/dL   Hgb urine dipstick TRACE (*) NEGATIVE   Bilirubin Urine NEGATIVE  NEGATIVE   Ketones, ur NEGATIVE  NEGATIVE mg/dL   Protein, ur NEGATIVE  NEGATIVE mg/dL   Urobilinogen, UA 0.2  0.0 - 1.0 mg/dL   Nitrite NEGATIVE  NEGATIVE   Leukocytes, UA NEGATIVE  NEGATIVE  CARDIAC PANEL(CRET KIN+CKTOT+MB+TROPI)      Component Value Range   Total CK 49  7 - 232 U/L   CK, MB 1.5  0.3 - 4.0 ng/mL   Troponin I <0.30  <0.30 ng/mL   Relative Index RELATIVE INDEX IS INVALID  0.0 - 2.5  GLUCOSE, CAPILLARY      Component Value Range   Glucose-Capillary 480 (*) 70 - 99 mg/dL  URINE MICROSCOPIC-ADD ON      Component Value Range   WBC, UA 0-2  <3 WBC/hpf   RBC / HPF    <3 RBC/hpf   Value: NO FORMED ELEMENTS SEEN ON URINE MICROSCOPIC EXAMINATION  GLUCOSE, CAPILLARY      Component Value Range   Glucose-Capillary 414 (*) 70 - 99 mg/dL   Dg Abd Acute  W/chest  12/15/2011  *RADIOLOGY REPORT*  Clinical Data: Hypoglycemia.  Vomiting.  Weakness.  Weight loss.  ACUTE ABDOMEN SERIES (ABDOMEN 2 VIEW & CHEST 1 VIEW)  Comparison: 06/09/2011 and chest dated 06/11/2011.  Findings: Stable enlarged cardiac silhouette and mildly prominent pulmonary vasculature and interstitial markings.  No pleural fluid. Normal bowel gas pattern without free peritoneal air.  Lumbar and thoracic spine degenerative changes.  Stable right pelvic calcifications.  IMPRESSION:  1.  No acute abnormality. 2.  Stable cardiomegaly, mild pulmonary vascular congestion and mild chronic interstitial lung disease.  Original Report Authenticated By: Darrol Angel, M.D.       8:03 AM  Date: 12/15/2011  Rate: 82  Rhythm: normal sinus rhythm  QRS Axis: left  Intervals: normal  ST/T Wave abnormalities: normal  Conduction Disutrbances:left bundle branch block  Narrative Interpretation: Abnormal EKG.  Old  EKG Reviewed: unchanged   8:59 AM CBG was 480.  Glucose stabilizer ordered.  10:47 AM Results for orders placed during the hospital encounter of 12/15/11  CBC WITH DIFFERENTIAL      Component Value Range   WBC 5.7  4.0 - 10.5 K/uL   RBC 4.87  4.22 - 5.81 MIL/uL   Hemoglobin 12.2 (*) 13.0 - 17.0 g/dL   HCT 16.1 (*) 09.6 - 04.5 %   MCV 77.4 (*) 78.0 - 100.0 fL   MCH 25.1 (*) 26.0 - 34.0 pg   MCHC 32.4  30.0 - 36.0 g/dL   RDW 40.9  81.1 - 91.4 %   Platelets 173  150 - 400 K/uL   Neutrophils Relative 67  43 - 77 %   Neutro Abs 3.8  1.7 - 7.7 K/uL   Lymphocytes Relative 21  12 - 46 %   Lymphs Abs 1.2  0.7 - 4.0 K/uL   Monocytes Relative 7  3 - 12 %   Monocytes Absolute 0.4  0.1 - 1.0 K/uL   Eosinophils Relative 4  0 - 5 %   Eosinophils Absolute 0.2  0.0 - 0.7 K/uL   Basophils Relative 1  0 - 1 %   Basophils Absolute 0.0  0.0 - 0.1 K/uL  COMPREHENSIVE METABOLIC PANEL      Component Value Range   Sodium 132 (*) 135 - 145 mEq/L   Potassium 5.1  3.5 - 5.1 mEq/L   Chloride  100  96 - 112 mEq/L   CO2 25  19 - 32 mEq/L   Glucose, Bld 511 (*) 70 - 99 mg/dL   BUN 24 (*) 6 - 23 mg/dL   Creatinine, Ser 7.82 (*) 0.50 - 1.35 mg/dL   Calcium 8.4  8.4 - 95.6 mg/dL   Total Protein 6.1  6.0 - 8.3 g/dL   Albumin 3.2 (*) 3.5 - 5.2 g/dL   AST 12  0 - 37 U/L   ALT 18  0 - 53 U/L   Alkaline Phosphatase 79  39 - 117 U/L   Total Bilirubin 0.2 (*) 0.3 - 1.2 mg/dL   GFR calc non Af Amer 46 (*) >90 mL/min   GFR calc Af Amer 54 (*) >90 mL/min  URINALYSIS, ROUTINE W REFLEX MICROSCOPIC      Component Value Range   Color, Urine YELLOW  YELLOW   APPearance CLEAR  CLEAR   Specific Gravity, Urine 1.010  1.005 - 1.030   pH 6.0  5.0 - 8.0   Glucose, UA >1000 (*) NEGATIVE mg/dL   Hgb urine dipstick TRACE (*) NEGATIVE   Bilirubin Urine NEGATIVE  NEGATIVE   Ketones, ur NEGATIVE  NEGATIVE mg/dL   Protein, ur NEGATIVE  NEGATIVE mg/dL   Urobilinogen, UA 0.2  0.0 - 1.0 mg/dL   Nitrite NEGATIVE  NEGATIVE   Leukocytes, UA NEGATIVE  NEGATIVE  CARDIAC PANEL(CRET KIN+CKTOT+MB+TROPI)      Component Value Range   Total CK 49  7 - 232 U/L   CK, MB 1.5  0.3 - 4.0 ng/mL   Troponin I <0.30  <0.30 ng/mL   Relative Index RELATIVE INDEX IS INVALID  0.0 - 2.5  GLUCOSE, CAPILLARY      Component Value Range   Glucose-Capillary 480 (*) 70 - 99 mg/dL  URINE MICROSCOPIC-ADD ON      Component Value Range   WBC, UA 0-2  <3 WBC/hpf   RBC / HPF    <3 RBC/hpf   Value: NO FORMED ELEMENTS  SEEN ON URINE MICROSCOPIC EXAMINATION  GLUCOSE, CAPILLARY      Component Value Range   Glucose-Capillary 414 (*) 70 - 99 mg/dL   Dg Abd Acute W/chest  12/15/2011  *RADIOLOGY REPORT*  Clinical Data: Hypoglycemia.  Vomiting.  Weakness.  Weight loss.  ACUTE ABDOMEN SERIES (ABDOMEN 2 VIEW & CHEST 1 VIEW)  Comparison: 06/09/2011 and chest dated 06/11/2011.  Findings: Stable enlarged cardiac silhouette and mildly prominent pulmonary vasculature and interstitial markings.  No pleural fluid. Normal bowel gas pattern without  free peritoneal air.  Lumbar and thoracic spine degenerative changes.  Stable right pelvic calcifications.  IMPRESSION:  1.  No acute abnormality. 2.  Stable cardiomegaly, mild pulmonary vascular congestion and mild chronic interstitial lung disease.  Original Report Authenticated By: Darrol Angel, M.D.   10:48 AM Lab tests showed no DKA.  Abdomina X-rays negative.  Cardiac markers negative.  Will observe to see if glucose comes down readily.    11:49 AM CBG down to 320.  He is feeling better.  Advised he will be able to be released when his blood sugar is around 200.    1:31 PM Last CBG was 258.  Will release.  Advised followup with Dr. Janna Arch, his internist, to help him better control his diabetes.  1. Hyperglycemia         I personally performed the services described in this documentation, which was scribed in my presence. The recorded information has been reviewed and considered.  Osvaldo Human, M.D.      Carleene Cooper III, MD 12/15/11 [phone]

## 2011-12-21 ENCOUNTER — Emergency Department (HOSPITAL_COMMUNITY)
Admission: EM | Admit: 2011-12-21 | Discharge: 2011-12-21 | Disposition: A | Discharge status: Home / Self Care | Payer: Medicare Other | Attending: Emergency Medicine | Admitting: Emergency Medicine

## 2011-12-21 ENCOUNTER — Encounter (HOSPITAL_COMMUNITY): Payer: Self-pay | Admitting: *Deleted

## 2011-12-21 DIAGNOSIS — Z7982 Long term (current) use of aspirin: Secondary | ICD-10-CM | POA: Insufficient documentation

## 2011-12-21 DIAGNOSIS — I10 Essential (primary) hypertension: Secondary | ICD-10-CM | POA: Insufficient documentation

## 2011-12-21 DIAGNOSIS — R739 Hyperglycemia, unspecified: Secondary | ICD-10-CM

## 2011-12-21 DIAGNOSIS — Z794 Long term (current) use of insulin: Secondary | ICD-10-CM | POA: Insufficient documentation

## 2011-12-21 DIAGNOSIS — I2510 Atherosclerotic heart disease of native coronary artery without angina pectoris: Secondary | ICD-10-CM | POA: Insufficient documentation

## 2011-12-21 DIAGNOSIS — I509 Heart failure, unspecified: Secondary | ICD-10-CM | POA: Insufficient documentation

## 2011-12-21 DIAGNOSIS — E1169 Type 2 diabetes mellitus with other specified complication: Secondary | ICD-10-CM | POA: Insufficient documentation

## 2011-12-21 DIAGNOSIS — Z79899 Other long term (current) drug therapy: Secondary | ICD-10-CM | POA: Insufficient documentation

## 2011-12-21 DIAGNOSIS — E7800 Pure hypercholesterolemia, unspecified: Secondary | ICD-10-CM | POA: Insufficient documentation

## 2011-12-21 LAB — CBC WITH DIFFERENTIAL/PLATELET
Basophils Absolute: 0 10*3/uL (ref 0.0–0.1)
Eosinophils Absolute: 0.3 10*3/uL (ref 0.0–0.7)
Eosinophils Relative: 5 % (ref 0–5)
MCH: 25.7 pg — ABNORMAL LOW (ref 26.0–34.0)
MCHC: 33.2 g/dL (ref 30.0–36.0)
MCV: 77.2 fL — ABNORMAL LOW (ref 78.0–100.0)
Monocytes Absolute: 0.5 10*3/uL (ref 0.1–1.0)
Platelets: 197 10*3/uL (ref 150–400)
RDW: 14.7 % (ref 11.5–15.5)
WBC: 6.7 10*3/uL (ref 4.0–10.5)

## 2011-12-21 LAB — GLUCOSE, CAPILLARY
Glucose-Capillary: 387 mg/dL — ABNORMAL HIGH (ref 70–99)
Glucose-Capillary: 94 mg/dL (ref 70–99)

## 2011-12-21 LAB — BASIC METABOLIC PANEL
Calcium: 9.5 mg/dL (ref 8.4–10.5)
Creatinine, Ser: 1.39 mg/dL — ABNORMAL HIGH (ref 0.50–1.35)
GFR calc non Af Amer: 51 mL/min — ABNORMAL LOW (ref >90)
Sodium: 133 mEq/L — ABNORMAL LOW (ref 135–145)

## 2011-12-21 MED ORDER — INSULIN ASPART 100 UNIT/ML ~~LOC~~ SOLN
10.0000 [IU] | Freq: Once | SUBCUTANEOUS | Status: AC
  Administered 2011-12-21: 10 [IU] via INTRAVENOUS
  Filled 2011-12-21: qty 1

## 2011-12-21 MED ORDER — SODIUM CHLORIDE 0.9 % IV BOLUS (SEPSIS)
1000.0000 mL | Freq: Once | INTRAVENOUS | Status: AC
  Administered 2011-12-21: 1000 mL via INTRAVENOUS

## 2011-12-21 NOTE — ED Notes (Signed)
Pt reports having issues controlling his sugar for a week. been in the high 400 at home

## 2011-12-21 NOTE — ED Provider Notes (Signed)
History     CSN: [id number]  Arrival date & time 12/21/11  4098   First MD Initiated Contact with Patient 12/21/11 0207      Chief Complaint  Patient presents with  . Hyperglycemia    (Consider location/radiation/quality/duration/timing/severity/associated sxs/prior treatment) HPI... hi blood sugar today.  Patient has had hyperglycemia for a long period of time. He takes both oral hypoglycemics and insulin bedtime. No obvious polyuria polydipsia or polyphagia. Nothing makes symptoms better or worse. Severity is moderate. No chest pain and shortness of breath.  Past Medical History  Diagnosis Date  . Coronary artery disease   . Hypertension   . Cancer   . Pneumonia   . CHF (congestive heart failure)   . High cholesterol   . Diabetes mellitus   . Hepatitis     Past Surgical History  Procedure Date  . Back surgery     History reviewed. No pertinent family history.  History  Substance Use Topics  . Smoking status: Former Games developer  . Smokeless tobacco: Not on file  . Alcohol Use: No      Review of Systems  All other systems reviewed and are negative.    Allergies  Review of patient's allergies indicates no known allergies.  Home Medications   Current Outpatient Rx  Name Route Sig Dispense Refill  . ASPIRIN EC 81 MG PO TBEC Oral Take 81 mg by mouth daily.      . ATENOLOL 50 MG PO TABS Oral Take 50 mg by mouth daily.      Marland Kitchen CLOPIDOGREL BISULFATE 75 MG PO TABS Oral Take 75 mg by mouth daily.      Marland Kitchen DILTIAZEM HCL ER BEADS 120 MG PO CP24 Oral Take 120 mg by mouth daily.      Marland Kitchen GLIPIZIDE 5 MG PO TABS Oral Take 1 tablet (5 mg total) by mouth daily before breakfast. 30 tablet 5  . INSULIN GLARGINE 100 UNIT/ML [place] SOLN Subcutaneous Inject 40 Units into the skin at bedtime.      . ISOSORBIDE MONONITRATE ER 60 MG PO TB24 Oral Take 60 mg by mouth daily.      Marland Kitchen LISINOPRIL 40 MG PO TABS Oral Take 0.5 tablets (20 mg total) by mouth daily. 30 tablet 2  . METFORMIN HCL 500 MG  PO TABS Oral Take 1 tablet (500 mg total) by mouth 2 (two) times daily with a meal. 60 tablet 3  . METHADONE HCL 10 MG PO TABS Oral Take 20 mg by mouth 2 (two) times daily.      Marland Kitchen NITROGLYCERIN 0.4 MG/HR TD PT24 Transdermal Place 1 patch onto the skin daily.      Marland Kitchen NITROGLYCERIN 0.4 MG SL SUBL Sublingual Place 0.4 mg under the tongue every 5 (five) minutes as needed. Chest pain     . PANTOPRAZOLE SODIUM 40 MG PO TBEC Oral Take 40 mg by mouth daily.      Marland Kitchen PAROXETINE HCL 10 MG PO TABS Oral Take 2 tablets (20 mg total) by mouth daily. 30 tablet 3  . PRAVASTATIN SODIUM 40 MG PO TABS Oral Take 40 mg by mouth daily.      Marland Kitchen TAMSULOSIN HCL 0.4 MG PO CAPS Oral Take 1 capsule (0.4 mg total) by mouth daily. 30 capsule 3    BP 132/76  Pulse 67  Temp 98.3 F (36.8 C)  Resp 18  Ht 5\' 7"  (1.702 m)  Wt 240 lb (108.863 kg)  BMI 37.59 kg/m2  SpO2 99%  Physical  Exam  Nursing note and vitals reviewed. Constitutional: He is oriented to person, place, and time. He appears well-developed and well-nourished.  HENT:  Head: Normocephalic and atraumatic.  Eyes: Conjunctivae and EOM are normal. Pupils are equal, round, and reactive to light.  Neck: Normal range of motion. Neck supple.  Cardiovascular: Normal rate and regular rhythm.   Pulmonary/Chest: Effort normal and breath sounds normal.  Abdominal: Soft. Bowel sounds are normal.  Musculoskeletal: Normal range of motion.  Neurological: He is alert and oriented to person, place, and time.  Skin: Skin is warm and dry.  Psychiatric: He has a normal mood and affect.    ED Course  Procedures (including critical care time)  Labs Reviewed  CBC WITH DIFFERENTIAL - Abnormal; Notable for the following:    Hemoglobin 12.5 (*)     HCT 37.6 (*)     MCV 77.2 (*)     MCH 25.7 (*)     All other components within normal limits  BASIC METABOLIC PANEL - Abnormal; Notable for the following:    Sodium 133 (*)     Glucose, Bld 404 (*)     Creatinine, Ser 1.39 (*)      GFR calc non Af Amer 51 (*)     GFR calc Af Amer 59 (*)     All other components within normal limits  GLUCOSE, CAPILLARY - Abnormal; Notable for the following:    Glucose-Capillary 387 (*)     All other components within normal limits  GLUCOSE, CAPILLARY - Abnormal; Notable for the following:    Glucose-Capillary 246 (*)     All other components within normal limits  GLUCOSE, CAPILLARY   No results found.   1. Hyperglycemia       MDM  Patient is not in DKA. Will hydrate and give IV insulin. He is alert and oriented. Patient is stable at discharge       Donnetta Hutching, MD 12/21/11 [phone]

## 2011-12-21 NOTE — ED Notes (Signed)
POC BG 98

## 2011-12-21 NOTE — ED Notes (Signed)
Pt reports blood sugar has been >400 all day.  States he checked before bed and was 498.

## 2011-12-21 NOTE — ED Notes (Signed)
Pt alert & oriented x4, stable gait. Patient given discharge instructions, paperwork & prescription(s). Patient  instructed to stop at the registration desk to finish any additional paperwork. Patient verbalized understanding. Pt left department w/ no further questions. 

## 2012-03-09 ENCOUNTER — Emergency Department (HOSPITAL_COMMUNITY)
Admission: EM | Admit: 2012-03-09 | Discharge: 2012-03-09 | Disposition: A | Discharge status: Home / Self Care | Payer: Medicare Other | Attending: Emergency Medicine | Admitting: Emergency Medicine

## 2012-03-09 ENCOUNTER — Other Ambulatory Visit: Payer: Self-pay

## 2012-03-09 ENCOUNTER — Emergency Department (HOSPITAL_COMMUNITY): Payer: Medicare Other

## 2012-03-09 ENCOUNTER — Encounter (HOSPITAL_COMMUNITY): Payer: Self-pay | Admitting: *Deleted

## 2012-03-09 DIAGNOSIS — K8020 Calculus of gallbladder without cholecystitis without obstruction: Secondary | ICD-10-CM | POA: Insufficient documentation

## 2012-03-09 DIAGNOSIS — K7689 Other specified diseases of liver: Secondary | ICD-10-CM | POA: Insufficient documentation

## 2012-03-09 DIAGNOSIS — I509 Heart failure, unspecified: Secondary | ICD-10-CM | POA: Insufficient documentation

## 2012-03-09 DIAGNOSIS — I10 Essential (primary) hypertension: Secondary | ICD-10-CM | POA: Insufficient documentation

## 2012-03-09 DIAGNOSIS — R109 Unspecified abdominal pain: Secondary | ICD-10-CM

## 2012-03-09 DIAGNOSIS — R1031 Right lower quadrant pain: Secondary | ICD-10-CM | POA: Insufficient documentation

## 2012-03-09 DIAGNOSIS — Z79899 Other long term (current) drug therapy: Secondary | ICD-10-CM | POA: Insufficient documentation

## 2012-03-09 DIAGNOSIS — K5730 Diverticulosis of large intestine without perforation or abscess without bleeding: Secondary | ICD-10-CM | POA: Insufficient documentation

## 2012-03-09 DIAGNOSIS — E119 Type 2 diabetes mellitus without complications: Secondary | ICD-10-CM | POA: Insufficient documentation

## 2012-03-09 DIAGNOSIS — Z7982 Long term (current) use of aspirin: Secondary | ICD-10-CM | POA: Insufficient documentation

## 2012-03-09 LAB — COMPREHENSIVE METABOLIC PANEL
BUN: 12 mg/dL (ref 6–23)
CO2: 28 mEq/L (ref 19–32)
Calcium: 9.1 mg/dL (ref 8.4–10.5)
Chloride: 102 mEq/L (ref 96–112)
Creatinine, Ser: 1.3 mg/dL (ref 0.50–1.35)
GFR calc Af Amer: 64 mL/min — ABNORMAL LOW (ref >90)
GFR calc non Af Amer: 55 mL/min — ABNORMAL LOW (ref >90)
Glucose, Bld: 175 mg/dL — ABNORMAL HIGH (ref 70–99)
Total Bilirubin: 0.2 mg/dL — ABNORMAL LOW (ref 0.3–1.2)

## 2012-03-09 LAB — CBC WITH DIFFERENTIAL/PLATELET
Eosinophils Relative: 5 % (ref 0–5)
HCT: 37.3 % — ABNORMAL LOW (ref 39.0–52.0)
Hemoglobin: 11.9 g/dL — ABNORMAL LOW (ref 13.0–17.0)
Lymphocytes Relative: 25 % (ref 12–46)
MCV: 76.3 fL — ABNORMAL LOW (ref 78.0–100.0)
Monocytes Absolute: 0.8 10*3/uL (ref 0.1–1.0)
Monocytes Relative: 12 % (ref 3–12)
Neutro Abs: 3.9 10*3/uL (ref 1.7–7.7)
WBC: 6.8 10*3/uL (ref 4.0–10.5)

## 2012-03-09 LAB — URINE MICROSCOPIC-ADD ON

## 2012-03-09 LAB — LIPASE, BLOOD: Lipase: 42 U/L (ref 11–59)

## 2012-03-09 LAB — URINALYSIS, ROUTINE W REFLEX MICROSCOPIC
Glucose, UA: NEGATIVE mg/dL
Ketones, ur: NEGATIVE mg/dL
Leukocytes, UA: NEGATIVE
Nitrite: NEGATIVE
Urobilinogen, UA: 0.2 mg/dL (ref 0.0–1.0)

## 2012-03-09 MED ORDER — SODIUM CHLORIDE 0.9 % IV SOLN
1000.0000 mL | INTRAVENOUS | Status: DC
  Administered 2012-03-09: 1000 mL via INTRAVENOUS

## 2012-03-09 MED ORDER — SODIUM CHLORIDE 0.9 % IV SOLN
1000.0000 mL | Freq: Once | INTRAVENOUS | Status: AC
  Administered 2012-03-09: 1000 mL via INTRAVENOUS

## 2012-03-09 MED ORDER — HYDROMORPHONE HCL PF 1 MG/ML IJ SOLN
1.0000 mg | Freq: Once | INTRAMUSCULAR | Status: AC
  Administered 2012-03-09: 1 mg via INTRAVENOUS
  Filled 2012-03-09: qty 1

## 2012-03-09 MED ORDER — IOHEXOL 300 MG/ML  SOLN
100.0000 mL | Freq: Once | INTRAMUSCULAR | Status: AC | PRN
  Administered 2012-03-09: 100 mL via INTRAVENOUS

## 2012-03-09 MED ORDER — ONDANSETRON HCL 4 MG/2ML IJ SOLN
4.0000 mg | Freq: Once | INTRAMUSCULAR | Status: AC
  Administered 2012-03-09: 4 mg via INTRAVENOUS
  Filled 2012-03-09: qty 2

## 2012-03-09 MED ORDER — ONDANSETRON 8 MG PO TBDP: 8.0000 mg | ORAL_TABLET | Freq: Three times a day (TID) | ORAL | Status: DC | PRN

## 2012-03-09 MED ORDER — ALBUTEROL SULFATE HFA 108 (90 BASE) MCG/ACT IN AERS
2.0000 | INHALATION_SPRAY | Freq: Once | RESPIRATORY_TRACT | Status: AC
  Administered 2012-03-09: 2 via RESPIRATORY_TRACT
  Filled 2012-03-09: qty 6.7

## 2012-03-09 NOTE — ED Notes (Addendum)
Pt c/o right sided abdominal pain. Pt  "thinks" his kidney cancer has come back. Pt had right kidney removed 3 yrs ago. Pt is supposed to see his cancer dr at [facility].

## 2012-03-09 NOTE — ED Notes (Signed)
Pt resting in bed, his only complaint is that he can't drink anymore of the contrast, he feels like he is going to bust. Chrissie Noa, ct called and informed.

## 2012-03-09 NOTE — ED Notes (Signed)
ekg will be done due to pts pain, nausea,  and diaphoresis.

## 2012-03-09 NOTE — ED Provider Notes (Signed)
History     CSN: [id number]  Arrival date & time 03/09/12  0102   First MD Initiated Contact with Patient 03/09/12 [phone]      Chief Complaint  Patient presents with  . Abdominal Pain     The history is provided by the patient and medical records.   The patient has been having abdominal pain for several days located in the right side of his abdomen.  His pain has been persistent.  His had nausea and vomiting.  He reports his pain is in his mid right lower abdomen.  He reports his pain is not worsened with food.  He does not have an intermittent nature of his pain.  He has a history of renal cell carcinoma status post nephrectomy several years ago.  He reports she's had multiple follow ups with his oncologist and currently he is cancer free.  He has follow up later this week for the cause of his abdominal pain.  Reports his pain became more severe this evening and thus he presented emergency department for evaluation.  His had nausea with non bloody non bilious vomiting.  He denies diarrhea.  He denies fevers he has no chills.  He reports his abdominal pain hurts worse when he vomits.  His had no blood in his stool.  He denies dysuria urinary frequency.  No chest pain shortness of breath.  He does report slight amount of wheezing and request an albuterol inhaler.  Patient has a history of coronary artery disease hypertension cancer and diabetes.  He used to smoke cigarettes.  Pain is mild to moderate at this time.   Past Medical History  Diagnosis Date  . Coronary artery disease   . Hypertension   . Cancer   . Pneumonia   . CHF (congestive heart failure)   . High cholesterol   . Diabetes mellitus   . Hepatitis     Past Surgical History  Procedure Date  . Back surgery     History reviewed. No pertinent family history.  History  Substance Use Topics  . Smoking status: Former Games developer  . Smokeless tobacco: Not on file  . Alcohol Use: No      Review of Systems  All other  systems reviewed and are negative.    Allergies  Review of patient's allergies indicates no known allergies.  Home Medications   Current Outpatient Rx  Name Route Sig Dispense Refill  . ASPIRIN EC 81 MG PO TBEC Oral Take 81 mg by mouth daily.      . ATENOLOL 50 MG PO TABS Oral Take 50 mg by mouth daily.      Marland Kitchen CLOPIDOGREL BISULFATE 75 MG PO TABS Oral Take 75 mg by mouth daily.      Marland Kitchen DILTIAZEM HCL ER BEADS 120 MG PO CP24 Oral Take 120 mg by mouth daily.      Marland Kitchen GLIPIZIDE 5 MG PO TABS Oral Take 1 tablet (5 mg total) by mouth daily before breakfast. 30 tablet 5  . INSULIN GLARGINE 100 UNIT/ML [place] SOLN Subcutaneous Inject 50 Units into the skin at bedtime.     . ISOSORBIDE MONONITRATE ER 60 MG PO TB24 Oral Take 60 mg by mouth daily.      Marland Kitchen LISINOPRIL 40 MG PO TABS Oral Take 0.5 tablets (20 mg total) by mouth daily. 30 tablet 2  . METFORMIN HCL 500 MG PO TABS Oral Take 1 tablet (500 mg total) by mouth 2 (two) times daily with a meal. 60  tablet 3  . METHADONE HCL 10 MG PO TABS Oral Take 20 mg by mouth 2 (two) times daily.      Marland Kitchen NITROGLYCERIN 0.4 MG/HR TD PT24 Transdermal Place 1 patch onto the skin daily.      Marland Kitchen NITROGLYCERIN 0.4 MG SL SUBL Sublingual Place 0.4 mg under the tongue every 5 (five) minutes as needed. Chest pain     . PANTOPRAZOLE SODIUM 40 MG PO TBEC Oral Take 40 mg by mouth daily.      Marland Kitchen PRAVASTATIN SODIUM 40 MG PO TABS Oral Take 40 mg by mouth daily.      Marland Kitchen PAROXETINE HCL 10 MG PO TABS Oral Take 2 tablets (20 mg total) by mouth daily. 30 tablet 3  . TAMSULOSIN HCL 0.4 MG PO CAPS Oral Take 1 capsule (0.4 mg total) by mouth daily. 30 capsule 3    BP 186/92  Temp 97.9 F (36.6 C) (Oral)  Resp 20  Ht 5' 7.5" (1.715 m)  Wt 242 lb (109.77 kg)  BMI 37.34 kg/m2  SpO2 96%  Physical Exam  Nursing note and vitals reviewed. Constitutional: He is oriented to person, place, and time. He appears well-developed and well-nourished.  HENT:  Head: Normocephalic and atraumatic.    Eyes: EOM are normal.  Neck: Normal range of motion.  Cardiovascular: Normal rate, regular rhythm, normal heart sounds and intact distal pulses.   Pulmonary/Chest: Effort normal and breath sounds normal. No respiratory distress.  Abdominal: Soft. He exhibits no distension.       Generalized tenderness throughout his abdomen without focality.  No right upper quadrant pain.  Well-healed right-sided abdominal scar consistent with nephrectomy.  No peritonitis on exam.  No rash.  Musculoskeletal: Normal range of motion.  Neurological: He is alert and oriented to person, place, and time.  Skin: Skin is warm and dry.  Psychiatric: He has a normal mood and affect. Judgment normal.    ED Course  Procedures (including critical care time)   Date: 03/09/2012  Rate: 80  Rhythm: normal sinus rhythm  QRS Axis: normal  Intervals: normal  ST/T Wave abnormalities: nonspecific  Conduction Disutrbances: LBBB Narrative Interpretation:   Old EKG Reviewed: No significant changes noted     Labs Reviewed  CBC WITH DIFFERENTIAL - Abnormal; Notable for the following:    Hemoglobin 11.9 (*)     HCT 37.3 (*)     MCV 76.3 (*)     MCH 24.3 (*)     All other components within normal limits  COMPREHENSIVE METABOLIC PANEL - Abnormal; Notable for the following:    Glucose, Bld 175 (*)     Total Bilirubin 0.2 (*)     GFR calc non Af Amer 55 (*)     GFR calc Af Amer 64 (*)     All other components within normal limits  URINALYSIS, ROUTINE W REFLEX MICROSCOPIC - Abnormal; Notable for the following:    Hgb urine dipstick TRACE (*)     Protein, ur TRACE (*)     All other components within normal limits  LIPASE, BLOOD  LACTIC ACID, PLASMA  URINE MICROSCOPIC-ADD ON   Ct Abdomen Pelvis W Contrast  03/09/2012  *RADIOLOGY REPORT*  Clinical Data: Right-sided abdominal pain  CT ABDOMEN AND PELVIS WITH CONTRAST  Technique:  Multidetector CT imaging of the abdomen and pelvis was performed following the standard  protocol during bolus administration of intravenous contrast.  Contrast: OMNIPAQUE IOHEXOL 300 MG/ML  SOLN  Comparison: 04/27/2007 CT  Findings:  Coronary artery calcification.  Heart size upper normal. Trace pleural effusions.  No pericardial effusion.  Diffuse low attenuation of the liver is nonspecific post contrast however suggests fatty infiltration.  Unremarkable spleen, pancreas, and adrenal glands.  Gallstones.  No biliary ductal dilatation.  No pericholecystic fluid or gallbladder wall thickening.  Surgically absent right kidney.  No soft tissue within the surgical bed.  There is a nonspecific 9 mm hypodensity arising laterally from the interpolar left kidney.  No hydronephrosis or hydroureter.  No bowel obstruction.  Colonic diverticulosis.  No CT evidence for colitis or diverticulitis.  Normal appendix.  No free intraperitoneal air or fluid.  No lymphadenopathy.  There is scattered atherosclerotic calcification of the aorta and its branches. No aneurysmal dilatation.  Nearly decompressed bladder.  Nonspecific prostatic calcifications. No pelvic lymphadenopathy.  Bilateral SI joint ankylosis and lumbar degenerative changes. No acute osseous finding.  Multilevel lumbar disc osteophyte complexes resulting moderate to severe central canal narrowing.  IMPRESSION: Status post right nephrectomy.  No CT evidence for local recurrence or abdominopelvic metastases.  Hepatic steatosis.  Cholelithiasis.  Colonic diverticulosis.  Scattered atherosclerotic disease.  Multilevel degenerative changes as above.   Original Report Authenticated By: Waneta Martins, M.D.     I personally reviewed the imaging tests through PACS system I reviewed available ER/hospitalization records thought the EMR    1. Abdominal pain       MDM  6:22 AM Patient reports he feels much better at this time.  Repeat abdominal exam is benign.  He was given an albuterol inhaler for his wheezing.  CT abdomen pelvis labs and urine  are all within normal limits without any significant changes.  The patient has been instructed to followup with his primary care physician.  Recommended  referral to general surgery as he does have cholelithiasis although am not convinced this is the cause of his symptoms today.        Lyanne Co, MD 03/09/12 [phone]

## 2012-03-09 NOTE — ED Notes (Signed)
Pt taken to ct 

## 2012-03-09 NOTE — ED Notes (Signed)
Pt is now telling edp that he has been vomiting up white stuff.

## 2012-03-13 ENCOUNTER — Emergency Department (HOSPITAL_COMMUNITY)
Admission: EM | Admit: 2012-03-13 | Discharge: 2012-03-14 | Disposition: A | Discharge status: Home / Self Care | Payer: Medicare Other | Attending: Emergency Medicine | Admitting: Emergency Medicine

## 2012-03-13 DIAGNOSIS — I2510 Atherosclerotic heart disease of native coronary artery without angina pectoris: Secondary | ICD-10-CM | POA: Insufficient documentation

## 2012-03-13 DIAGNOSIS — S40019A Contusion of unspecified shoulder, initial encounter: Secondary | ICD-10-CM | POA: Insufficient documentation

## 2012-03-13 DIAGNOSIS — I509 Heart failure, unspecified: Secondary | ICD-10-CM | POA: Insufficient documentation

## 2012-03-13 DIAGNOSIS — Z79899 Other long term (current) drug therapy: Secondary | ICD-10-CM | POA: Insufficient documentation

## 2012-03-13 DIAGNOSIS — S40011A Contusion of right shoulder, initial encounter: Secondary | ICD-10-CM

## 2012-03-13 DIAGNOSIS — E789 Disorder of lipoprotein metabolism, unspecified: Secondary | ICD-10-CM | POA: Insufficient documentation

## 2012-03-13 DIAGNOSIS — E119 Type 2 diabetes mellitus without complications: Secondary | ICD-10-CM | POA: Insufficient documentation

## 2012-03-13 DIAGNOSIS — W208XXA Other cause of strike by thrown, projected or falling object, initial encounter: Secondary | ICD-10-CM | POA: Insufficient documentation

## 2012-03-13 DIAGNOSIS — Z794 Long term (current) use of insulin: Secondary | ICD-10-CM | POA: Insufficient documentation

## 2012-03-13 DIAGNOSIS — Z7982 Long term (current) use of aspirin: Secondary | ICD-10-CM | POA: Insufficient documentation

## 2012-03-13 DIAGNOSIS — I10 Essential (primary) hypertension: Secondary | ICD-10-CM | POA: Insufficient documentation
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
## 2012-03-14 ENCOUNTER — Emergency Department (HOSPITAL_COMMUNITY): Payer: Medicare Other

## 2012-03-14 ENCOUNTER — Encounter (HOSPITAL_COMMUNITY): Payer: Self-pay | Admitting: *Deleted

## 2012-03-14 MED ORDER — OXYCODONE-ACETAMINOPHEN 5-325 MG PO TABS: 1.0000 | ORAL_TABLET | Freq: Four times a day (QID) | ORAL | Status: DC | PRN

## 2012-03-14 MED ORDER — KETOROLAC TROMETHAMINE 60 MG/2ML IM SOLN
60.0000 mg | Freq: Once | INTRAMUSCULAR | Status: AC
  Administered 2012-03-14: 60 mg via INTRAMUSCULAR
  Filled 2012-03-14: qty 2
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
## 2012-06-15 ENCOUNTER — Encounter (HOSPITAL_COMMUNITY): Payer: Self-pay | Admitting: Emergency Medicine

## 2012-06-15 ENCOUNTER — Emergency Department (HOSPITAL_COMMUNITY)
Admission: EM | Admit: 2012-06-15 | Discharge: 2012-06-16 | Disposition: A | Discharge status: Home / Self Care | Payer: Medicare Other | Attending: Emergency Medicine | Admitting: Emergency Medicine

## 2012-06-15 DIAGNOSIS — G8929 Other chronic pain: Secondary | ICD-10-CM | POA: Insufficient documentation

## 2012-06-15 DIAGNOSIS — Z794 Long term (current) use of insulin: Secondary | ICD-10-CM | POA: Insufficient documentation

## 2012-06-15 DIAGNOSIS — M25511 Pain in right shoulder: Secondary | ICD-10-CM

## 2012-06-15 DIAGNOSIS — E7800 Pure hypercholesterolemia, unspecified: Secondary | ICD-10-CM | POA: Insufficient documentation

## 2012-06-15 DIAGNOSIS — Z87891 Personal history of nicotine dependence: Secondary | ICD-10-CM | POA: Insufficient documentation

## 2012-06-15 DIAGNOSIS — I10 Essential (primary) hypertension: Secondary | ICD-10-CM | POA: Insufficient documentation

## 2012-06-15 DIAGNOSIS — M25519 Pain in unspecified shoulder: Secondary | ICD-10-CM | POA: Insufficient documentation

## 2012-06-15 DIAGNOSIS — Z9889 Other specified postprocedural states: Secondary | ICD-10-CM | POA: Insufficient documentation

## 2012-06-15 DIAGNOSIS — Z8719 Personal history of other diseases of the digestive system: Secondary | ICD-10-CM | POA: Insufficient documentation

## 2012-06-15 DIAGNOSIS — G8921 Chronic pain due to trauma: Secondary | ICD-10-CM | POA: Insufficient documentation

## 2012-06-15 DIAGNOSIS — Z859 Personal history of malignant neoplasm, unspecified: Secondary | ICD-10-CM | POA: Insufficient documentation

## 2012-06-15 DIAGNOSIS — E119 Type 2 diabetes mellitus without complications: Secondary | ICD-10-CM | POA: Insufficient documentation

## 2012-06-15 DIAGNOSIS — Z7982 Long term (current) use of aspirin: Secondary | ICD-10-CM | POA: Insufficient documentation

## 2012-06-15 DIAGNOSIS — I509 Heart failure, unspecified: Secondary | ICD-10-CM | POA: Insufficient documentation

## 2012-06-15 DIAGNOSIS — I2510 Atherosclerotic heart disease of native coronary artery without angina pectoris: Secondary | ICD-10-CM | POA: Insufficient documentation

## 2012-06-15 DIAGNOSIS — Z79899 Other long term (current) drug therapy: Secondary | ICD-10-CM | POA: Insufficient documentation

## 2012-06-15 DIAGNOSIS — M79609 Pain in unspecified limb: Secondary | ICD-10-CM | POA: Insufficient documentation

## 2012-06-15 NOTE — ED Notes (Signed)
Pt complains of rightshoulder pain, states he tore a ligament in his right shoulder 5 months ago and has had chronic pain since then. States over the past several days the pain has been flaring up and he is currently out of his pain medication. Denies new injury. Also states he thinks his blood glucose is high. States it was over 200 earlier tonight. CBG 178 on triage.

## 2012-06-15 NOTE — ED Notes (Signed)
Patient reports fell onto right arm and was diagnosed with torn ligament approximately 3-4 months ago. Reports worsening pain to right shoulder.

## 2012-06-15 NOTE — ED Notes (Signed)
Patient reports blood sugar was over 200 last night.

## 2012-06-16 MED ORDER — HYDROCODONE-ACETAMINOPHEN 5-500 MG PO CAPS: 1.0000 | ORAL_CAPSULE | Freq: Four times a day (QID) | ORAL | Status: DC | PRN

## 2012-06-16 MED ORDER — HYDROCODONE-ACETAMINOPHEN 5-325 MG PO TABS
1.0000 | ORAL_TABLET | Freq: Once | ORAL | Status: AC
  Administered 2012-06-16: 1 via ORAL
  Filled 2012-06-16: qty 1

## 2012-06-16 NOTE — ED Provider Notes (Signed)
History     CSN: [id number]  Arrival date & time 06/15/12  2237   First MD Initiated Contact with Patient 06/15/12 2335      Chief Complaint  Patient presents with  . Shoulder Pain  . Arm Pain    (Consider location/radiation/quality/duration/timing/severity/associated sxs/prior treatment) HPI Comments: Pt of dr. Janna Arch.  Also, states he has an MD in [place] that prescribes methadone and lorcet for his chronic back pain.  Expects to get his shipment of meds tomorrow.  Patient is a 72 y.o. male presenting with shoulder pain and arm pain. The history is provided by the patient. No language interpreter was used.  Shoulder Pain This is a chronic problem. Episode onset: ~ 4 months ago. The problem occurs constantly. The problem has been gradually worsening. Pertinent negatives include no chills or fever. Exacerbated by: movement and palpation. He has tried nothing for the symptoms. The treatment provided no relief.  Arm Pain Pertinent negatives include no chills or fever.    Past Medical History  Diagnosis Date  . Coronary artery disease   . Hypertension   . Cancer   . Pneumonia   . CHF (congestive heart failure)   . High cholesterol   . Diabetes mellitus   . Hepatitis     Past Surgical History  Procedure Date  . Back surgery     History reviewed. No pertinent family history.  History  Substance Use Topics  . Smoking status: Former Games developer  . Smokeless tobacco: Not on file  . Alcohol Use: No      Review of Systems  Constitutional: Negative for fever and chills.  Musculoskeletal:       Shoulder injury and pain  Skin: Negative for wound.  All other systems reviewed and are negative.    Allergies  Review of patient's allergies indicates no known allergies.  Home Medications   Current Outpatient Rx  Name  Route  Sig  Dispense  Refill  . ASPIRIN EC 81 MG PO TBEC   Oral   Take 81 mg by mouth daily.           . ATENOLOL 50 MG PO TABS   Oral   Take 50  mg by mouth daily.           Marland Kitchen CLOPIDOGREL BISULFATE 75 MG PO TABS   Oral   Take 75 mg by mouth daily.           Marland Kitchen DILTIAZEM HCL ER BEADS 120 MG PO CP24   Oral   Take 120 mg by mouth daily.           Marland Kitchen GLIPIZIDE 5 MG PO TABS   Oral   Take 1 tablet (5 mg total) by mouth daily before breakfast.   30 tablet   5   . HYDROCODONE-ACETAMINOPHEN 5-500 MG PO CAPS   Oral   Take 1 capsule by mouth every 6 (six) hours as needed for pain.   12 capsule   0   . INSULIN GLARGINE 100 UNIT/ML [place] SOLN   Subcutaneous   Inject 50 Units into the skin at bedtime.          . ISOSORBIDE MONONITRATE ER 60 MG PO TB24   Oral   Take 60 mg by mouth daily.           Marland Kitchen LISINOPRIL 40 MG PO TABS   Oral   Take 0.5 tablets (20 mg total) by mouth daily.   30 tablet   2   .  METFORMIN HCL 500 MG PO TABS   Oral   Take 1 tablet (500 mg total) by mouth 2 (two) times daily with a meal.   60 tablet   3   . METHADONE HCL 10 MG PO TABS   Oral   Take 20 mg by mouth 2 (two) times daily.           Marland Kitchen NITROGLYCERIN 0.4 MG/HR TD PT24   Transdermal   Place 1 patch onto the skin daily.           Marland Kitchen NITROGLYCERIN 0.4 MG SL SUBL   Sublingual   Place 0.4 mg under the tongue every 5 (five) minutes as needed. Chest pain          . ONDANSETRON 8 MG PO TBDP   Oral   Take 1 tablet (8 mg total) by mouth every 8 (eight) hours as needed for nausea.   10 tablet   0   . OXYCODONE-ACETAMINOPHEN 5-325 MG PO TABS   Oral   Take 1-2 tablets by mouth every 6 (six) hours as needed for pain.   15 tablet   0   . PANTOPRAZOLE SODIUM 40 MG PO TBEC   Oral   Take 40 mg by mouth daily.           Marland Kitchen PAROXETINE HCL 10 MG PO TABS   Oral   Take 2 tablets (20 mg total) by mouth daily.   30 tablet   3   . PRAVASTATIN SODIUM 40 MG PO TABS   Oral   Take 40 mg by mouth daily.           Marland Kitchen TAMSULOSIN HCL 0.4 MG PO CAPS   Oral   Take 1 capsule (0.4 mg total) by mouth daily.   30 capsule   3     BP  141/74  Pulse 81  Temp 98.1 F (36.7 C) (Oral)  Resp 16  Ht 5\' 7"  (1.702 m)  Wt 242 lb (109.77 kg)  BMI 37.90 kg/m2  SpO2 99%  Physical Exam  Nursing note and vitals reviewed. Constitutional: He is oriented to person, place, and time. He appears well-developed and well-nourished.  HENT:  Head: Normocephalic and atraumatic.  Eyes: EOM are normal.  Neck: Normal range of motion.  Cardiovascular: Normal rate, regular rhythm, normal heart sounds and intact distal pulses.   Pulmonary/Chest: Effort normal and breath sounds normal. No respiratory distress.  Abdominal: Soft. He exhibits no distension. There is no tenderness.  Musculoskeletal: He exhibits tenderness.       Right shoulder: He exhibits decreased range of motion, tenderness and pain. He exhibits no bony tenderness, no swelling, no effusion, no crepitus, no deformity, no laceration, no spasm, normal pulse and normal strength.       Arms: Neurological: He is alert and oriented to person, place, and time.  Skin: Skin is warm and dry.  Psychiatric: He has a normal mood and affect. Judgment normal.    ED Course  Procedures (including critical care time)  Labs Reviewed  GLUCOSE, CAPILLARY - Abnormal; Notable for the following:    Glucose-Capillary 178 (*)     All other components within normal limits   No results found.   1. Chronic right shoulder pain       MDM  Lorcet, 12 F/u with dr. Janna Arch and dr. Romeo Apple.        Evalina Field, [place] 06/16/12 [phone]

## 2012-06-17 NOTE — ED Provider Notes (Signed)
Medical screening examination/treatment/procedure(s) were performed by non-physician practitioner and as supervising physician I was immediately available for consultation/collaboration.  Juliet Rude. Rubin Payor, MD 06/17/12 [id number]

## 2012-08-21 ENCOUNTER — Encounter (HOSPITAL_COMMUNITY): Payer: Self-pay

## 2012-08-21 ENCOUNTER — Inpatient Hospital Stay (HOSPITAL_COMMUNITY)
Admission: EM | Admit: 2012-08-21 | Discharge: 2012-09-07 | DRG: 308 | Disposition: A | Discharge status: Home Health | Payer: Medicare Other | Attending: Family Medicine | Admitting: Family Medicine

## 2012-08-21 ENCOUNTER — Emergency Department (HOSPITAL_COMMUNITY): Payer: Medicare Other

## 2012-08-21 DIAGNOSIS — I4891 Unspecified atrial fibrillation: Principal | ICD-10-CM

## 2012-08-21 DIAGNOSIS — Z833 Family history of diabetes mellitus: Secondary | ICD-10-CM

## 2012-08-21 DIAGNOSIS — J9600 Acute respiratory failure, unspecified whether with hypoxia or hypercapnia: Secondary | ICD-10-CM | POA: Diagnosis not present

## 2012-08-21 DIAGNOSIS — Z550 Illiteracy and low-level literacy: Secondary | ICD-10-CM

## 2012-08-21 DIAGNOSIS — Z6837 Body mass index (BMI) 37.0-37.9, adult: Secondary | ICD-10-CM

## 2012-08-21 DIAGNOSIS — J690 Pneumonitis due to inhalation of food and vomit: Secondary | ICD-10-CM | POA: Diagnosis not present

## 2012-08-21 DIAGNOSIS — Z79899 Other long term (current) drug therapy: Secondary | ICD-10-CM

## 2012-08-21 DIAGNOSIS — E119 Type 2 diabetes mellitus without complications: Secondary | ICD-10-CM | POA: Diagnosis present

## 2012-08-21 DIAGNOSIS — E785 Hyperlipidemia, unspecified: Secondary | ICD-10-CM

## 2012-08-21 DIAGNOSIS — I152 Hypertension secondary to endocrine disorders: Secondary | ICD-10-CM

## 2012-08-21 DIAGNOSIS — N183 Chronic kidney disease, stage 3 unspecified: Secondary | ICD-10-CM | POA: Diagnosis present

## 2012-08-21 DIAGNOSIS — E7800 Pure hypercholesterolemia, unspecified: Secondary | ICD-10-CM | POA: Diagnosis present

## 2012-08-21 DIAGNOSIS — E1159 Type 2 diabetes mellitus with other circulatory complications: Secondary | ICD-10-CM

## 2012-08-21 DIAGNOSIS — E876 Hypokalemia: Secondary | ICD-10-CM | POA: Diagnosis present

## 2012-08-21 DIAGNOSIS — Z794 Long term (current) use of insulin: Secondary | ICD-10-CM

## 2012-08-21 DIAGNOSIS — Z9861 Coronary angioplasty status: Secondary | ICD-10-CM

## 2012-08-21 DIAGNOSIS — I129 Hypertensive chronic kidney disease with stage 1 through stage 4 chronic kidney disease, or unspecified chronic kidney disease: Secondary | ICD-10-CM | POA: Diagnosis present

## 2012-08-21 DIAGNOSIS — M51369 Other intervertebral disc degeneration, lumbar region without mention of lumbar back pain or lower extremity pain: Secondary | ICD-10-CM

## 2012-08-21 DIAGNOSIS — Z87891 Personal history of nicotine dependence: Secondary | ICD-10-CM

## 2012-08-21 DIAGNOSIS — Z91199 Patient's noncompliance with other medical treatment and regimen due to unspecified reason: Secondary | ICD-10-CM

## 2012-08-21 DIAGNOSIS — I509 Heart failure, unspecified: Secondary | ICD-10-CM | POA: Diagnosis present

## 2012-08-21 DIAGNOSIS — J449 Chronic obstructive pulmonary disease, unspecified: Secondary | ICD-10-CM | POA: Diagnosis present

## 2012-08-21 DIAGNOSIS — G929 Unspecified toxic encephalopathy: Secondary | ICD-10-CM | POA: Diagnosis not present

## 2012-08-21 DIAGNOSIS — N390 Urinary tract infection, site not specified: Secondary | ICD-10-CM | POA: Diagnosis not present

## 2012-08-21 DIAGNOSIS — Z905 Acquired absence of kidney: Secondary | ICD-10-CM

## 2012-08-21 DIAGNOSIS — J189 Pneumonia, unspecified organism: Secondary | ICD-10-CM | POA: Diagnosis not present

## 2012-08-21 DIAGNOSIS — I447 Left bundle-branch block, unspecified: Secondary | ICD-10-CM | POA: Diagnosis present

## 2012-08-21 DIAGNOSIS — G4730 Sleep apnea, unspecified: Secondary | ICD-10-CM | POA: Diagnosis present

## 2012-08-21 DIAGNOSIS — D509 Iron deficiency anemia, unspecified: Secondary | ICD-10-CM | POA: Diagnosis present

## 2012-08-21 DIAGNOSIS — N179 Acute kidney failure, unspecified: Secondary | ICD-10-CM | POA: Diagnosis not present

## 2012-08-21 DIAGNOSIS — N2581 Secondary hyperparathyroidism of renal origin: Secondary | ICD-10-CM | POA: Diagnosis present

## 2012-08-21 DIAGNOSIS — J441 Chronic obstructive pulmonary disease with (acute) exacerbation: Secondary | ICD-10-CM | POA: Diagnosis not present

## 2012-08-21 DIAGNOSIS — Z8249 Family history of ischemic heart disease and other diseases of the circulatory system: Secondary | ICD-10-CM

## 2012-08-21 DIAGNOSIS — Z85528 Personal history of other malignant neoplasm of kidney: Secondary | ICD-10-CM

## 2012-08-21 DIAGNOSIS — I2510 Atherosclerotic heart disease of native coronary artery without angina pectoris: Secondary | ICD-10-CM

## 2012-08-21 DIAGNOSIS — IMO0001 Reserved for inherently not codable concepts without codable children: Secondary | ICD-10-CM

## 2012-08-21 DIAGNOSIS — M51379 Other intervertebral disc degeneration, lumbosacral region without mention of lumbar back pain or lower extremity pain: Secondary | ICD-10-CM | POA: Diagnosis present

## 2012-08-21 DIAGNOSIS — K759 Inflammatory liver disease, unspecified: Secondary | ICD-10-CM | POA: Diagnosis present

## 2012-08-21 HISTORY — DX: Paroxysmal atrial fibrillation: I48.0

## 2012-08-21 HISTORY — DX: Left bundle-branch block, unspecified: I44.7

## 2012-08-21 HISTORY — DX: Disorder of kidney and ureter, unspecified: N28.9

## 2012-08-21 HISTORY — DX: Sleep apnea, unspecified: G47.30

## 2012-08-21 LAB — CBC WITH DIFFERENTIAL/PLATELET
Hemoglobin: 12.5 g/dL — ABNORMAL LOW (ref 13.0–17.0)
Lymphs Abs: 1.7 10*3/uL (ref 0.7–4.0)
Monocytes Relative: 7 % (ref 3–12)
Neutro Abs: 6.1 10*3/uL (ref 1.7–7.7)
Neutrophils Relative %: 70 % (ref 43–77)
Platelets: 312 10*3/uL (ref 150–400)
RBC: 5.22 MIL/uL (ref 4.22–5.81)
WBC: 8.8 10*3/uL (ref 4.0–10.5)

## 2012-08-21 LAB — PROTIME-INR
INR: 0.98 (ref 0.00–1.49)
Prothrombin Time: 12.9 seconds (ref 11.6–15.2)

## 2012-08-21 LAB — BASIC METABOLIC PANEL
BUN: 16 mg/dL (ref 6–23)
Chloride: 103 mEq/L (ref 96–112)
GFR calc Af Amer: 71 mL/min — ABNORMAL LOW (ref >90)
Glucose, Bld: 163 mg/dL — ABNORMAL HIGH (ref 70–99)
Potassium: 4.6 mEq/L (ref 3.5–5.1)
Sodium: 139 mEq/L (ref 135–145)

## 2012-08-21 LAB — TROPONIN I: Troponin I: <0.3 ng/mL (ref <0.30)

## 2012-08-21 LAB — GLUCOSE, CAPILLARY: Glucose-Capillary: 154 mg/dL — ABNORMAL HIGH (ref 70–99)

## 2012-08-21 LAB — PRO B NATRIURETIC PEPTIDE: Pro B Natriuretic peptide (BNP): 516.5 pg/mL — ABNORMAL HIGH (ref 0–125)

## 2012-08-21 MED ORDER — WARFARIN VIDEO
Freq: Once | Status: AC
  Administered 2012-08-21: 21:00:00

## 2012-08-21 MED ORDER — ENOXAPARIN SODIUM 40 MG/0.4ML ~~LOC~~ SOLN
40.0000 mg | SUBCUTANEOUS | Status: AC
  Administered 2012-08-21: 40 mg via SUBCUTANEOUS
  Filled 2012-08-21: qty 0.4

## 2012-08-21 MED ORDER — DILTIAZEM HCL ER COATED BEADS 120 MG PO CP24
120.0000 mg | ORAL_CAPSULE | Freq: Every day | ORAL | Status: DC
  Administered 2012-08-21 – 2012-08-28 (×7): 120 mg via ORAL
  Filled 2012-08-21 (×7): qty 1

## 2012-08-21 MED ORDER — ENOXAPARIN SODIUM 100 MG/ML ~~LOC~~ SOLN
1.0000 mg/kg | Freq: Two times a day (BID) | SUBCUTANEOUS | Status: DC
  Administered 2012-08-22: 100 mg via SUBCUTANEOUS
  Filled 2012-08-21: qty 1

## 2012-08-21 MED ORDER — GLIPIZIDE 5 MG PO TABS
5.0000 mg | ORAL_TABLET | Freq: Every day | ORAL | Status: DC
  Administered 2012-08-22 – 2012-08-23 (×2): 5 mg via ORAL
  Filled 2012-08-21: qty 1

## 2012-08-21 MED ORDER — NITROGLYCERIN 0.4 MG SL SUBL
0.4000 mg | SUBLINGUAL_TABLET | SUBLINGUAL | Status: DC | PRN
  Administered 2012-09-02 (×2): 0.4 mg via SUBLINGUAL
  Filled 2012-08-21: qty 25

## 2012-08-21 MED ORDER — ENOXAPARIN SODIUM 60 MG/0.6ML ~~LOC~~ SOLN
60.0000 mg | SUBCUTANEOUS | Status: DC
  Administered 2012-08-21: 60 mg via SUBCUTANEOUS
  Filled 2012-08-21: qty 0.6

## 2012-08-21 MED ORDER — WARFARIN SODIUM 10 MG PO TABS
10.0000 mg | ORAL_TABLET | Freq: Once | ORAL | Status: AC
  Administered 2012-08-21: 10 mg via ORAL
  Filled 2012-08-21: qty 1

## 2012-08-21 MED ORDER — INSULIN GLARGINE 100 UNIT/ML ~~LOC~~ SOLN
40.0000 [IU] | Freq: Every day | SUBCUTANEOUS | Status: DC
  Administered 2012-08-21 – 2012-08-22 (×2): 40 [IU] via SUBCUTANEOUS

## 2012-08-21 MED ORDER — PANTOPRAZOLE SODIUM 40 MG PO TBEC
40.0000 mg | DELAYED_RELEASE_TABLET | Freq: Every day | ORAL | Status: DC
  Administered 2012-08-21 – 2012-08-26 (×6): 40 mg via ORAL
  Filled 2012-08-21 (×6): qty 1

## 2012-08-21 MED ORDER — METFORMIN HCL 500 MG PO TABS
500.0000 mg | ORAL_TABLET | Freq: Every day | ORAL | Status: DC
  Administered 2012-08-23: 500 mg via ORAL
  Filled 2012-08-21: qty 1

## 2012-08-21 MED ORDER — LISINOPRIL 10 MG PO TABS
20.0000 mg | ORAL_TABLET | Freq: Every day | ORAL | Status: DC
  Administered 2012-08-21 – 2012-08-24 (×3): 20 mg via ORAL
  Filled 2012-08-21 (×6): qty 2

## 2012-08-21 MED ORDER — ISOSORBIDE MONONITRATE ER 60 MG PO TB24
60.0000 mg | ORAL_TABLET | Freq: Every day | ORAL | Status: DC
  Administered 2012-08-21 – 2012-08-26 (×6): 60 mg via ORAL
  Filled 2012-08-21 (×7): qty 1

## 2012-08-21 MED ORDER — METFORMIN HCL 500 MG PO TABS
500.0000 mg | ORAL_TABLET | Freq: Two times a day (BID) | ORAL | Status: DC
  Administered 2012-08-21 – 2012-08-22 (×3): 500 mg via ORAL
  Filled 2012-08-21 (×4): qty 1

## 2012-08-21 MED ORDER — WARFARIN - PHARMACIST DOSING INPATIENT: Freq: Every day | Status: DC

## 2012-08-21 MED ORDER — AMIODARONE HCL IN DEXTROSE 360-4.14 MG/200ML-% IV SOLN: 30.0000 mg/h | INTRAVENOUS | Status: DC

## 2012-08-21 MED ORDER — ASPIRIN EC 81 MG PO TBEC
81.0000 mg | DELAYED_RELEASE_TABLET | Freq: Every day | ORAL | Status: DC
  Administered 2012-08-21 – 2012-08-25 (×5): 81 mg via ORAL
  Filled 2012-08-21 (×5): qty 1

## 2012-08-21 MED ORDER — CLOPIDOGREL BISULFATE 75 MG PO TABS
75.0000 mg | ORAL_TABLET | Freq: Every day | ORAL | Status: DC
  Filled 2012-08-21: qty 1

## 2012-08-21 MED ORDER — AMIODARONE IV BOLUS ONLY 150 MG/100ML
150.0000 mg | Freq: Once | INTRAVENOUS | Status: AC
  Administered 2012-08-21: 150 mg via INTRAVENOUS
  Filled 2012-08-21: qty 100

## 2012-08-21 MED ORDER — ATENOLOL 25 MG PO TABS
50.0000 mg | ORAL_TABLET | Freq: Every day | ORAL | Status: DC
  Administered 2012-08-21 – 2012-08-31 (×7): 50 mg via ORAL
  Filled 2012-08-21 (×8): qty 2
  Filled 2012-08-21: qty 1
  Filled 2012-08-21 (×2): qty 2

## 2012-08-21 MED ORDER — COUMADIN BOOK
Freq: Once | Status: AC
  Administered 2012-08-22: 14:00:00
  Filled 2012-08-21: qty 1

## 2012-08-21 MED ORDER — PAROXETINE HCL 20 MG PO TABS
20.0000 mg | ORAL_TABLET | Freq: Every day | ORAL | Status: DC
  Administered 2012-08-21 – 2012-09-07 (×16): 20 mg via ORAL
  Filled 2012-08-21 (×16): qty 1

## 2012-08-21 MED ORDER — AMIODARONE HCL IN DEXTROSE 360-4.14 MG/200ML-% IV SOLN
60.0000 mg/h | INTRAVENOUS | Status: DC
  Administered 2012-08-21: 60 mg/h via INTRAVENOUS
  Filled 2012-08-21 (×2): qty 200

## 2012-08-21 MED ORDER — METHADONE HCL 10 MG PO TABS
20.0000 mg | ORAL_TABLET | Freq: Two times a day (BID) | ORAL | Status: DC
  Administered 2012-08-21 – 2012-08-26 (×9): 20 mg via ORAL
  Filled 2012-08-21 (×10): qty 2

## 2012-08-21 MED ORDER — SODIUM CHLORIDE 0.9 % IV SOLN
INTRAVENOUS | Status: DC
  Administered 2012-08-21 – 2012-08-23 (×3): via INTRAVENOUS

## 2012-08-21 NOTE — H&P (Signed)
[id number] 
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
## 2012-08-21 NOTE — ED Provider Notes (Signed)
History     CSN: [id number]  Arrival date & time 08/21/12  1145   First MD Initiated Contact with Patient 08/21/12 1208      Chief Complaint  Patient presents with  . Tachycardia     HPI Pt was seen at 1210.   Per pt, c/o gradual onset and persistence of constant palpitations that began this morning when he woke up approx 0800.  Pt states he "felt like my heart was going to bust out of my chest" because "it was beating so fast."  Was associated with SOB, diaphoresis and chest "pressure."  Endorses hx of "irregular heart rate" and describes his symptoms as "like when my heart goes irregular."  Denies back pain, no abd pain, no N/V/D, no syncope.     Past Medical History  Diagnosis Date  . Coronary artery disease   . Hypertension   . Cancer   . Pneumonia   . CHF (congestive heart failure)   . High cholesterol   . Diabetes mellitus   . Hepatitis   . Renal disorder     kidney cancer  . LBBB (left bundle branch block)   . Paroxysmal atrial fibrillation   . Sleep apnea     Past Surgical History  Procedure Laterality Date  . Back surgery    . Coronary angioplasty with stent placement    . Nephrectomy       History  Substance Use Topics  . Smoking status: Former Games developer  . Smokeless tobacco: Not on file  . Alcohol Use: No     Review of Systems ROS: Statement: All systems negative except as marked or noted in the HPI; Constitutional: Negative for fever and chills. ; ; Eyes: Negative for eye pain, redness and discharge. ; ; ENMT: Negative for ear pain, hoarseness, nasal congestion, sinus pressure and sore throat. ; ; Cardiovascular: +chest pain, palpitations, diaphoresis, dyspnea. ; ; Respiratory: Negative for cough, wheezing and stridor. ; ; Gastrointestinal: Negative for nausea, vomiting, diarrhea, abdominal pain, blood in stool, hematemesis, jaundice and rectal bleeding. . ; ; Genitourinary: Negative for dysuria, flank pain and hematuria. ; ; Musculoskeletal: Negative for  back pain and neck pain. Negative for swelling and trauma.; ; Skin: Negative for pruritus, rash, abrasions, blisters, bruising and skin lesion.; ; Neuro: Negative for headache, lightheadedness and neck stiffness. Negative for weakness, altered level of consciousness , altered mental status, extremity weakness, paresthesias, involuntary movement, seizure and syncope.      Allergies  Review of patient's allergies indicates no known allergies.  Home Medications   Current Outpatient Rx  Name  Route  Sig  Dispense  Refill  . aspirin EC 81 MG tablet   Oral   Take 81 mg by mouth daily.           Marland Kitchen atenolol (TENORMIN) 50 MG tablet   Oral   Take 50 mg by mouth daily.           . clopidogrel (PLAVIX) 75 MG tablet   Oral   Take 75 mg by mouth daily.           Marland Kitchen glipiZIDE (GLUCOTROL) 5 MG tablet   Oral   Take 1 tablet (5 mg total) by mouth daily before breakfast.   30 tablet   5   . hydrocodone-acetaminophen (LORCET-HD) 5-500 MG per capsule   Oral   Take 1 capsule by mouth every 6 (six) hours as needed for pain.   12 capsule   0   .  insulin glargine (LANTUS) 100 UNIT/ML injection   Subcutaneous   Inject 40 Units into the skin at bedtime.          . isosorbide mononitrate (IMDUR) 60 MG 24 hr tablet   Oral   Take 60 mg by mouth daily.           Marland Kitchen lisinopril (PRINIVIL,ZESTRIL) 40 MG tablet   Oral   Take 0.5 tablets (20 mg total) by mouth daily.   30 tablet   2   . metFORMIN (GLUCOPHAGE) 500 MG tablet   Oral   Take 500 mg by mouth daily with breakfast.         . methadone (DOLOPHINE) 10 MG tablet   Oral   Take 20 mg by mouth 2 (two) times daily.           . nitroGLYCERIN (NITRODUR - DOSED IN MG/24 HR) 0.4 mg/hr   Transdermal   Place 1 patch onto the skin daily.           . nitroGLYCERIN (NITROSTAT) 0.4 MG SL tablet   Sublingual   Place 0.4 mg under the tongue every 5 (five) minutes as needed. Chest pain          . pantoprazole (PROTONIX) 40 MG  tablet   Oral   Take 40 mg by mouth daily.           Marland Kitchen PARoxetine (PAXIL) 10 MG tablet   Oral   Take 2 tablets (20 mg total) by mouth daily.   30 tablet   3     BP 121/63  Pulse 70  Temp(Src) 98.1 F (36.7 C) (Oral)  Resp 16  SpO2 100%  Physical Exam 1215: Physical examination:  Nursing notes reviewed; Vital signs and O2 SAT reviewed;  Constitutional: Well developed, Well nourished, Well hydrated, In no acute distress; Head:  Normocephalic, atraumatic; Eyes: EOMI, PERRL, No scleral icterus; ENMT: Mouth and pharynx normal, Mucous membranes moist; Neck: Supple, Full range of motion, No lymphadenopathy; Cardiovascular: Irregular irregular rate and rhythm, tachycardic, No gallop; Respiratory: Breath sounds clear & equal bilaterally, No rales, rhonchi, wheezes.  Speaking full sentences with ease, Normal respiratory effort/excursion; Chest: Nontender, Movement normal; Abdomen: Soft, Nontender, Nondistended, Normal bowel sounds;; Extremities: Pulses normal, No tenderness, No edema, No calf edema or asymmetry.; Neuro: AA&Ox3, Major CN grossly intact.  Speech clear. No gross focal motor or sensory deficits in extremities.; Skin: Color normal, Warm, Dry.   ED Course  Procedures   1215:  On arrival, pt's monitor with afib/RVR, rate 150-160's.  Wide complex.  Will start IV amiodarone bolus and gtt.   1305:  Monitor now NSR, EKG repeated.  States he "feels better."  Denies CP/palpitations/SOB.  VSS.    1330:  Pt continues in NSR.  Denies any further CP/SOB. Dx and testing d/w pt and family.  Questions answered.  Verb understanding, agreeable to admit.  T/C to Dr. Janna Arch, case discussed, including:  HPI, pertinent PM/SHx, VS/PE, dx testing, ED course and treatment:  Agreeable to admit, requests he will come to ED for eval.    MDM  MDM Reviewed: previous chart, nursing note and vitals Reviewed previous: labs and ECG Interpretation: labs, ECG and x-ray Total time providing critical care:  30-74 minutes. This excludes time spent performing separately reportable procedures and services. Consults: admitting MD   CRITICAL CARE Performed by: Laray Anger Total critical care time: 35 Critical care time was exclusive of separately billable procedures and treating other patients. Critical care was necessary  to treat or prevent imminent or life-threatening deterioration. Critical care was time spent personally by me on the following activities: development of treatment plan with patient and/or surrogate as well as nursing, discussions with consultants, evaluation of patient's response to treatment, examination of patient, obtaining history from patient or surrogate, ordering and performing treatments and interventions, ordering and review of laboratory studies, ordering and review of radiographic studies, pulse oximetry and re-evaluation of patient's condition.    Date: 08/21/2012 on arrival  Rate: 146  Rhythm: atrial fibrillation  QRS Axis: left  Intervals: normal  ST/T Wave abnormalities: normal  Conduction Disutrbances:left bundle branch block  Narrative Interpretation:   Old EKG Reviewed: changes noted; previous EKG dated 03/09/2012 was NSR.   Date: 08/21/2012 after IV amiodarone  Rate: 70  Rhythm: normal sinus rhythm  QRS Axis: left  Intervals: normal  ST/T Wave abnormalities: normal  Conduction Disutrbances:left bundle branch block  Narrative Interpretation:   Old EKG Reviewed: changes noted; now NSR consistent with previous EKG dated 03/09/2012.  Results for orders placed during the hospital encounter of 08/21/12  CBC WITH DIFFERENTIAL      Result Value Range   WBC 8.8  4.0 - 10.5 K/uL   RBC 5.22  4.22 - 5.81 MIL/uL   Hemoglobin 12.5 (*) 13.0 - 17.0 g/dL   HCT 40.9  81.1 - 91.4 %   MCV 75.3 (*) 78.0 - 100.0 fL   MCH 23.9 (*) 26.0 - 34.0 pg   MCHC 31.8  30.0 - 36.0 g/dL   RDW 78.2 (*) 95.6 - 21.3 %   Platelets 312  150 - 400 K/uL   Neutrophils Relative 70   43 - 77 %   Neutro Abs 6.1  1.7 - 7.7 K/uL   Lymphocytes Relative 19  12 - 46 %   Lymphs Abs 1.7  0.7 - 4.0 K/uL   Monocytes Relative 7  3 - 12 %   Monocytes Absolute 0.6  0.1 - 1.0 K/uL   Eosinophils Relative 4  0 - 5 %   Eosinophils Absolute 0.4  0.0 - 0.7 K/uL   Basophils Relative 1  0 - 1 %   Basophils Absolute 0.1  0.0 - 0.1 K/uL  BASIC METABOLIC PANEL      Result Value Range   Sodium 139  135 - 145 mEq/L   Potassium 4.6  3.5 - 5.1 mEq/L   Chloride 103  96 - 112 mEq/L   CO2 24  19 - 32 mEq/L   Glucose, Bld 163 (*) 70 - 99 mg/dL   BUN 16  6 - 23 mg/dL   Creatinine, Ser 0.86  0.50 - 1.35 mg/dL   Calcium 9.4  8.4 - 57.8 mg/dL   GFR calc non Af Amer 61 (*) >90 mL/min   GFR calc Af Amer 71 (*) >90 mL/min  TROPONIN I      Result Value Range   Troponin I <0.30  <0.30 ng/mL  PRO B NATRIURETIC PEPTIDE      Result Value Range   Pro B Natriuretic peptide (BNP) 516.5 (*) 0 - 125 pg/mL  PROTIME-INR      Result Value Range   Prothrombin Time 12.9  11.6 - 15.2 seconds   INR 0.98  0.00 - 1.49   Dg Chest Port 1 View 08/21/2012  *RADIOLOGY REPORT*  Clinical Data: Shortness of breath.  PORTABLE CHEST - 1 VIEW  Comparison: Single view of the chest 06/11/2011.  Findings: There is cardiomegaly but no edema.  Lungs  are clear.  No pneumothorax or pleural effusion.  IMPRESSION: Cardiomegaly without acute disease.   Original Report Authenticated By: Holley Dexter, M.D.                Laray Anger, DO 08/22/12 [phone]

## 2012-08-21 NOTE — Progress Notes (Addendum)
ANTICOAGULATION CONSULT NOTE - Initial Consult  Pharmacy Consult for Lovenox Indication: Lovenox VTE prophylaxis, Warfarin Afib  No Known Allergies  Patient Measurements: Height: 5' 7.5" (171.5 cm) Weight: 215 lb 6.2 oz (97.7 kg) IBW/kg (Calculated) : 67.25 Body mass index is 33.22 kg/(m^2).  Vital Signs: Temp: 98.1 F (36.7 C) (03/14 1730) Temp src: Oral (03/14 1730) BP: 140/83 mmHg (03/14 1800) Pulse Rate: 70 (03/14 1613)  Labs:  Recent Labs  08/21/12 1219 08/21/12 1506  HGB 12.5*  --   HCT 39.3  --   PLT 312  --   LABPROT 12.9  --   INR 0.98  --   CREATININE 1.18  --   TROPONINI <0.30 <0.30    Estimated Creatinine Clearance: 66.4 ml/min (by C-G formula based on Cr of 1.18).   Medical History: Past Medical History  Diagnosis Date  . Coronary artery disease   . Hypertension   . Cancer   . Pneumonia   . CHF (congestive heart failure)   . High cholesterol   . Diabetes mellitus   . Hepatitis   . Renal disorder     kidney cancer  . LBBB (left bundle branch block)   . Paroxysmal atrial fibrillation   . Sleep apnea     Medications:  Scheduled:  . [COMPLETED] amiodarone  150 mg Intravenous Once  . aspirin EC  81 mg Oral Daily  . atenolol  50 mg Oral Daily  . diltiazem  120 mg Oral Daily  . enoxaparin (LOVENOX) injection  60 mg Subcutaneous Q24H  . [START ON 08/22/2012] glipiZIDE  5 mg Oral QAC breakfast  . insulin glargine  40 Units Subcutaneous QHS  . isosorbide mononitrate  60 mg Oral Daily  . lisinopril  20 mg Oral Daily  . [START ON 08/22/2012] metFORMIN  500 mg Oral Q breakfast  . metFORMIN  500 mg Oral BID WC  . methadone  20 mg Oral BID  . pantoprazole  40 mg Oral Daily  . PARoxetine  20 mg Oral Daily  . [DISCONTINUED] clopidogrel  75 mg Oral Daily    Assessment: OK for protocol.  Estimated Creatinine Clearance: 66.4 ml/min (by C-G formula based on Cr of 1.18).  Goal of Therapy:  VTE prophylaxis INR 2-3   Plan:  Change Lovenox to 40mg   SQ q24 hours. PT/INR Daily. Warfarin 10mg  PO x 1 today. Insurance underwriter.  Mady Gemma 08/21/2012,6:57 PM  Change to Treatment dose Lovenox for Afib per MD request.  Mady Gemma, [facility] 08/21/2012 10:02 PM

## 2012-08-21 NOTE — ED Notes (Signed)
Pt reports woke up this am around 0800 with sob, sweaty, and felt like heart was "going to bust out of my chest."  Still feels like heart is racing.  C/O headache and feeling nervous at this time.

## 2012-08-21 NOTE — Progress Notes (Signed)
ANTICOAGULATION CONSULT NOTE - Initial Consult  Pharmacy Consult for Lovenox Indication: VTE prophylaxis  No Known Allergies  Patient Measurements:   Last recorded weight = 110Kg  Vital Signs: Temp: 98.1 F (36.7 C) (03/14 1208) Temp src: Oral (03/14 1208) BP: 121/63 mmHg (03/14 1400) Pulse Rate: 70 (03/14 1400)  Labs:  Recent Labs  08/21/12 1219  HGB 12.5*  HCT 39.3  PLT 312  LABPROT 12.9  INR 0.98  CREATININE 1.18  TROPONINI <0.30    The CrCl is unknown because both a height and weight (above a minimum accepted value) are required for this calculation.   Medical History: Past Medical History  Diagnosis Date  . Coronary artery disease   . Hypertension   . Cancer   . Pneumonia   . CHF (congestive heart failure)   . High cholesterol   . Diabetes mellitus   . Hepatitis   . Renal disorder     kidney cancer  . LBBB (left bundle branch block)   . Paroxysmal atrial fibrillation   . Sleep apnea     Medications:  Scheduled:  . aspirin EC  81 mg Oral Daily  . atenolol  50 mg Oral Daily  . clopidogrel  75 mg Oral Daily  . enoxaparin (LOVENOX) injection  60 mg Subcutaneous Q24H  . [START ON 08/22/2012] glipiZIDE  5 mg Oral QAC breakfast  . insulin glargine  40 Units Subcutaneous QHS  . isosorbide mononitrate  60 mg Oral Daily  . lisinopril  20 mg Oral Daily  . [START ON 08/22/2012] metFORMIN  500 mg Oral Q breakfast  . metFORMIN  500 mg Oral BID WC  . methadone  20 mg Oral BID  . pantoprazole  40 mg Oral Daily  . PARoxetine  20 mg Oral Daily    Assessment: OK for protocol.  Pt is obese.  SCr is OK. Goal of Therapy:  VTE prophylaxis Monitor platelets by anticoagulation protocol: Yes   Plan:  Lovenox 60mg  SQ q24 hours (adjusted for BMI)  Valrie Hart A 08/21/2012,3:43 PM

## 2012-08-21 NOTE — ED Notes (Signed)
Amiodarone 150mg  bolus infused.  Cardiac monitor shows wide-complex tachycardia rate 137.

## 2012-08-21 NOTE — ED Notes (Signed)
Cardiac monitor shows sinus rhythm with rate of 74. Denies chest pain, denies shortness of breath.

## 2012-08-22 LAB — BASIC METABOLIC PANEL
CO2: 27 mEq/L (ref 19–32)
Chloride: 101 mEq/L (ref 96–112)
Glucose, Bld: 91 mg/dL (ref 70–99)
Potassium: 4.4 mEq/L (ref 3.5–5.1)
Sodium: 137 mEq/L (ref 135–145)

## 2012-08-22 LAB — TROPONIN I: Troponin I: <0.3 ng/mL (ref <0.30)

## 2012-08-22 LAB — GLUCOSE, CAPILLARY
Glucose-Capillary: 80 mg/dL (ref 70–99)
Glucose-Capillary: 95 mg/dL (ref 70–99)

## 2012-08-22 LAB — HEPATIC FUNCTION PANEL
AST: 10 U/L (ref 0–37)
Albumin: 3.3 g/dL — ABNORMAL LOW (ref 3.5–5.2)
Alkaline Phosphatase: 65 U/L (ref 39–117)
Total Bilirubin: 0.2 mg/dL — ABNORMAL LOW (ref 0.3–1.2)
Total Protein: 6.7 g/dL (ref 6.0–8.3)

## 2012-08-22 MED ORDER — WARFARIN SODIUM 10 MG PO TABS
10.0000 mg | ORAL_TABLET | Freq: Once | ORAL | Status: AC
  Administered 2012-08-22: 10 mg via ORAL
  Filled 2012-08-22: qty 1

## 2012-08-22 MED ORDER — ENOXAPARIN SODIUM 100 MG/ML ~~LOC~~ SOLN
1.0000 mg/kg | Freq: Two times a day (BID) | SUBCUTANEOUS | Status: DC
  Administered 2012-08-22 – 2012-08-25 (×7): 95 mg via SUBCUTANEOUS
  Filled 2012-08-22 (×7): qty 1

## 2012-08-22 NOTE — Plan of Care (Signed)
Problem: Consults Goal: Atrial Arhythmia Patient Education (See Patient Education module for education specifics.) Outcome: Progressing Patient with history of paroxsysmal afib, started on amiodarone gtt and brought to step down area  Problem: Phase I Progression Outcomes Goal: Initial discharge plan identified Outcome: Progressing Home

## 2012-08-22 NOTE — Progress Notes (Signed)
Pt transferred to 308. Report given to [facility]. VSS.

## 2012-08-22 NOTE — Progress Notes (Signed)
ANTICOAGULATION CONSULT NOTE - Initial Consult  Pharmacy Consult for Lovenox & Warfarin Indication: Afib  No Known Allergies  Patient Measurements: Height: 5' 7.5" (171.5 cm) Weight: 205 lb 0.4 oz (93 kg) IBW/kg (Calculated) : 67.25 Body mass index is 31.62 kg/(m^2).  Vital Signs: Temp: 98 F (36.7 C) (03/15 0752) Temp src: Oral (03/15 0752) BP: 90/48 mmHg (03/15 0600) Pulse Rate: 62 (03/15 0404)  Labs:  Recent Labs  08/21/12 1219 08/21/12 1506 08/21/12 2040 08/22/12 0245  HGB 12.5*  --   --   --   HCT 39.3  --   --   --   PLT 312  --   --   --   LABPROT 12.9  --   --  13.6  INR 0.98  --   --  1.05  CREATININE 1.18  --   --  1.52*  TROPONINI <0.30 <0.30 <0.30 <0.30    Estimated Creatinine Clearance: 50.3 ml/min (by C-G formula based on Cr of 1.52).   Medical History: Past Medical History  Diagnosis Date  . Coronary artery disease   . Hypertension   . Cancer   . Pneumonia   . CHF (congestive heart failure)   . High cholesterol   . Diabetes mellitus   . Hepatitis   . Renal disorder     kidney cancer  . LBBB (left bundle branch block)   . Paroxysmal atrial fibrillation   . Sleep apnea     Medications:  Scheduled:  . [COMPLETED] amiodarone  150 mg Intravenous Once  . aspirin EC  81 mg Oral Daily  . atenolol  50 mg Oral Daily  . coumadin book   Does not apply Once  . diltiazem  120 mg Oral Daily  . [COMPLETED] enoxaparin (LOVENOX) injection  40 mg Subcutaneous NOW   Followed by  . enoxaparin (LOVENOX) injection  1 mg/kg Subcutaneous Q12H  . glipiZIDE  5 mg Oral QAC breakfast  . insulin glargine  40 Units Subcutaneous QHS  . isosorbide mononitrate  60 mg Oral Daily  . lisinopril  20 mg Oral Daily  . metFORMIN  500 mg Oral Q breakfast  . metFORMIN  500 mg Oral BID WC  . methadone  20 mg Oral BID  . pantoprazole  40 mg Oral Daily  . PARoxetine  20 mg Oral Daily  . [COMPLETED] warfarin  10 mg Oral Once  . [COMPLETED] warfarin   Does not apply Once   . Warfarin - Pharmacist Dosing Inpatient   Does not apply q1800  . [DISCONTINUED] clopidogrel  75 mg Oral Daily  . [DISCONTINUED] enoxaparin (LOVENOX) injection  1 mg/kg Subcutaneous Q12H  . [DISCONTINUED] enoxaparin (LOVENOX) injection  60 mg Subcutaneous Q24H    Assessment: OK for protocol.  Estimated Creatinine Clearance: 50.3 ml/min (by C-G formula based on Cr of 1.52).  Goal of Therapy:  INR 2-3   Plan:  Change Lovenox to 95mg  SQ q12 hours. PT/INR Daily. Repeat Warfarin 10mg  PO x 1 today. Insurance underwriter.  Mady Gemma 08/22/2012,11:14 AM

## 2012-08-22 NOTE — Progress Notes (Signed)
[name], [name]                ACCOUNT NO.:  [id number]  MEDICAL RECORD NO.:  [id number]  LOCATION:  IC07                          FACILITY:  [facility]  PHYSICIAN:  Melvyn Novas, MDDATE OF BIRTH:  [birth year]  DATE OF PROCEDURE: DATE OF DISCHARGE:                                PROGRESS NOTE   The patient has history of coronary artery disease, multiple stents placed on 2 separate occasions, unknown details, history of insulin- dependent diabetes.  He is totally illiterate, question of compliance with many of his medicines.  The patient did present with AFib, rapid ventricular response in face of chronic left bundle, converted on amiodarone, currently in sinus rhythm with frequent APCs. Hemodynamically stable.  Cardiac enzymes are within normal limits x3. Seen in consultation by Cardiology, who added Cardizem p.o. and discontinued amiodarone in addition to his atenolol.  The patient has no complaints of chest pain.  PHYSICAL EXAMINATION:  VITAL SIGNS:  Blood pressure 123/78, sinus rhythm between 80 and 90, with frequent APCs, afebrile, respiratory rate is 18, O2 saturation 97%. LUNGS:  Clear.  No rales, wheeze, or rhonchi.  Prolonged expiratory phase.  Diminished breath sounds at the bases. HEART:  Regular rhythm.  No S3, S4.  No heaves, thrills, or rubs. ABDOMEN:  Soft, nontender.  Bowel sounds normoactive.  PLAN:  Plan right now is to continue to Cardizem CD 120 p.o. in addition to atenolol 50.  Monitor sinus rhythm.  The patient has Coumadin started per pharmacy protocol.  We will see how this works out.  We will monitor the patient inpatient due to high risk of difficulty with Coumadin and other medicines.  Also, we will see if he reverts to dysrhythmias in the next 24-48 hours.     Melvyn Novas, MD     RMD/MEDQ  D:  08/22/2012  T:  08/22/2012  Job:  [id number]

## 2012-08-22 NOTE — Progress Notes (Signed)
[id number] 

## 2012-08-22 NOTE — H&P (Signed)
[name], [name]                ACCOUNT NO.:  [id number]  MEDICAL RECORD NO.:  [id number]  LOCATION:  IC07                          FACILITY:  [facility]  PHYSICIAN:  [name], MDDATE OF BIRTH:  [birth year]  DATE OF ADMISSION:  08/21/2012 DATE OF DISCHARGE:  LH                             HISTORY & PHYSICAL   HISTORY OF PRESENT ILLNESS:  The patient is 72 year old white male, who is fully illiterate and has questionable compliance with medicines due to the above, has a history of his coronary artery disease.  States he has had 5 stents somewhere in [place], I have not seen the patient in over a year.  Apparently, the patient was woken up last night with fluttering intermittently of his heart, palpitations and some increasing dyspnea and this created some anxiety.  This persisted throughout the day.  Per report to the ER was found to be in AFib with a chronic left bundle branch block pattern given IV amiodarone and converted to sinus rhythm.  He denied any chest pain.  He did have some dyspnea and appears quite comfortable at present.  Troponins are negative.  The patient will be admitted.  He is reverted to normal sinus rhythm with a left bundle branch block pattern.  He denies any orthopnea, PND, dyspnea, nausea, or diaphoresis.  PAST MEDICAL HISTORY:  Significant for hypertension, hyperlipidemia, insulin-dependent diabetes, COPD, coronary artery disease, status post multiple stents.  PAST SURGICAL HISTORY:  Remarkable for lumbar laminectomy due to herniated nucleus pulposus.  ALLERGIES:  He has no known allergies.  He lives alone [place] in [place].  CURRENT MEDICINES:  According to old list are atenolol 50 p.o. daily, Plavix 75 p.o. daily, glipizide 5 mg p.o. daily, Lantus 40-45 units at bedtime, Imdur 60 mg per day, Zestril 20 mg p.o. daily, Glucophage 500 mg p.o. b.i.d., Protonix 40 mg p.o. daily, and Paxil 20 mg p.o. daily.  REVIEW OF SYSTEMS:  Negative for  seizures tremors polyuria, polydipsia, nausea, vomiting, melena, hematemesis, and hematochezia.  PHYSICAL EXAMINATION:  VITAL SIGNS:  Blood pressure 121/63, temperature 98.1, pulse 70 and regular at present, respiratory rate is 16, O2 sat is 100%. EYES:  PERRLA intact.  Sclerae clear.  Conjunctivae pink. NECK:  No JVD.  No carotid bruits.  No thyromegaly or thyroid bruits. LUNGS:  Diminished breath sounds at the bases.  Prolonged expiratory phase.  No rales, wheeze, or rhonchi appreciable. HEART:  Regular rhythm.  No S3 or S4.  No heaves, thrills, or rubs. ABDOMEN:  Soft, nontender.  Bowel sounds normoactive.  No guarding or rebound.  No masses.  No megaly. EXTREMITIES:  Trace to 1+ pedal edema. NEUROLOGIC:  Cranial nerves II through XII grossly intact.  The patient moves all 4 extremities.  Plantars are downgoing.  IMPRESSION:  As follows; 1. Paroxysmal atrial fibrillation reverted to normal sinus rhythm. 2. History of coronary artery disease, status post L4-5 stents,     unknown anatomy. 3. Hypertension. 4. Hyperlipidemia. 5. Insulin-dependent diabetes. 6. Full literacy as far as his medicines go. 7. Chronic noncompliance.  PLAN:  To admit, place in ICU, serial enzymes, control heart rate. Cardiology consultation.  We will obtain all cardiology  records and make further recommendations as the database expands.     Bobby Novas, MD     RMD/MEDQ  D:  08/21/2012  T:  08/22/2012  Job:  [id number]

## 2012-08-23 LAB — CBC
HCT: 34.2 % — ABNORMAL LOW (ref 39.0–52.0)
MCH: 24.2 pg — ABNORMAL LOW (ref 26.0–34.0)
MCHC: 31.3 g/dL (ref 30.0–36.0)
MCV: 77.2 fL — ABNORMAL LOW (ref 78.0–100.0)
Platelets: 278 10*3/uL (ref 150–400)
RDW: 16.1 % — ABNORMAL HIGH (ref 11.5–15.5)
WBC: 8.1 10*3/uL (ref 4.0–10.5)

## 2012-08-23 LAB — HEPATIC FUNCTION PANEL
ALT: 9 U/L (ref 0–53)
Bilirubin, Direct: <0.1 mg/dL (ref 0.0–0.3)
Total Bilirubin: 0.2 mg/dL — ABNORMAL LOW (ref 0.3–1.2)

## 2012-08-23 LAB — BASIC METABOLIC PANEL
BUN: 29 mg/dL — ABNORMAL HIGH (ref 6–23)
Chloride: 102 mEq/L (ref 96–112)
Creatinine, Ser: 1.76 mg/dL — ABNORMAL HIGH (ref 0.50–1.35)
GFR calc Af Amer: 44 mL/min — ABNORMAL LOW (ref >90)
GFR calc non Af Amer: 38 mL/min — ABNORMAL LOW (ref >90)
Glucose, Bld: 66 mg/dL — ABNORMAL LOW (ref 70–99)

## 2012-08-23 LAB — GLUCOSE, CAPILLARY
Glucose-Capillary: 101 mg/dL — ABNORMAL HIGH (ref 70–99)
Glucose-Capillary: 156 mg/dL — ABNORMAL HIGH (ref 70–99)
Glucose-Capillary: 47 mg/dL — ABNORMAL LOW (ref 70–99)
Glucose-Capillary: 49 mg/dL — ABNORMAL LOW (ref 70–99)
Glucose-Capillary: 51 mg/dL — ABNORMAL LOW (ref 70–99)
Glucose-Capillary: 66 mg/dL — ABNORMAL LOW (ref 70–99)
Glucose-Capillary: 82 mg/dL (ref 70–99)

## 2012-08-23 MED ORDER — DEXTROSE 50 % IV SOLN
INTRAVENOUS | Status: AC
  Administered 2012-08-23: 50 mL via INTRAVENOUS
  Filled 2012-08-23: qty 50

## 2012-08-23 MED ORDER — GLUCOSE 40 % PO GEL
1.0000 | ORAL | Status: DC | PRN
  Administered 2012-08-23: 37.5 g via ORAL
  Filled 2012-08-23: qty 0.83

## 2012-08-23 MED ORDER — DEXTROSE 10 % IV SOLN
INTRAVENOUS | Status: DC
  Administered 2012-08-23 – 2012-08-25 (×3): via INTRAVENOUS

## 2012-08-23 MED ORDER — GLUCOSE 40 % PO GEL
ORAL | Status: AC
  Administered 2012-08-23: 37.5 g
  Filled 2012-08-23: qty 0.83

## 2012-08-23 MED ORDER — WARFARIN SODIUM 7.5 MG PO TABS
7.5000 mg | ORAL_TABLET | Freq: Once | ORAL | Status: AC
  Administered 2012-08-23: 7.5 mg via ORAL
  Filled 2012-08-23: qty 1

## 2012-08-23 MED ORDER — DEXTROSE 50 % IV SOLN
1.0000 | Freq: Once | INTRAVENOUS | Status: AC
  Administered 2012-08-23: 50 mL via INTRAVENOUS

## 2012-08-23 NOTE — Progress Notes (Signed)
[id number] 

## 2012-08-23 NOTE — Progress Notes (Addendum)
Hypoglycemic Event  CBG: 51  Treatment: 15 GM gel  Symptoms: Pale, Sweaty, Shaky and Nervous/irritable  Follow-up CBG: Time CBG Result: 63 then up to 75 at 1737-pt eating dinner  Possible Reasons for Event: did not eat lunch, has been sleeping all day, decreased activity  Pt also was difficult to arouse.  Metformin has been d/ced, pt will be watched to ensure he eats dinner tray.   [name]  Remember to initiate Hypoglycemia Order Set & complete

## 2012-08-23 NOTE — Progress Notes (Signed)
Blood sugar up to 157 after 1 amp d50 given as ordered. Hung d10w @ 50 ml/hr. Arouseable but still sleepy.

## 2012-08-23 NOTE — Progress Notes (Signed)
PHARMACIST - PHYSICIAN COMMUNICATION DR:  Janna Arch CONCERNING:  METFORMIN SAFE ADMINISTRATION POLICY  RECOMMENDATION: Metformin has been placed on DISCONTINUE (rejected order) STATUS and should be reordered only after any of the conditions below are ruled out.  Current safety recommendations include avoiding metformin for a minimum of 48 hours after the patient's exposure to intravenous contrast media.  DESCRIPTION:  The Pharmacy Committee has adopted a policy that restricts the use of metformin in hospitalized patients until all the contraindications to administration have been ruled out. Specific contraindications are: [x]  Serum creatinine ? 1.5 for males []  Serum creatinine ? 1.4 for females []  Shock, acute MI, sepsis, hypoxemia, dehydration []  Planned administration of intravenous iodinated contrast media []  Heart Failure patients with low EF []  Acute or chronic metabolic acidosis (including DKA)     Mady Gemma, [facility] 08/23/2012 9:09 AM

## 2012-08-23 NOTE — Progress Notes (Signed)
ANTICOAGULATION CONSULT NOTE  Pharmacy Consult for Lovenox & Warfarin Indication: Afib  No Known Allergies  Patient Measurements: Height: 5' 7.5" (171.5 cm) Weight: 205 lb 0.4 oz (93 kg) IBW/kg (Calculated) : 67.25 Body mass index is 31.62 kg/(m^2).  Vital Signs: BP: 105/46 mmHg (03/16 0825) Pulse Rate: 80 (03/16 0825)  Labs:  Recent Labs  08/21/12 1219 08/21/12 1506 08/21/12 2040 08/22/12 0245 08/23/12 0600  HGB 12.5*  --   --   --  10.7*  HCT 39.3  --   --   --  34.2*  PLT 312  --   --   --  278  LABPROT 12.9  --   --  13.6 15.1  INR 0.98  --   --  1.05 1.21  CREATININE 1.18  --   --  1.52* 1.76*  TROPONINI <0.30 <0.30 <0.30 <0.30  --     Estimated Creatinine Clearance: 43.5 ml/min (by C-G formula based on Cr of 1.76).   Medical History: Past Medical History  Diagnosis Date  . Coronary artery disease   . Hypertension   . Cancer   . Pneumonia   . CHF (congestive heart failure)   . High cholesterol   . Diabetes mellitus   . Hepatitis   . Renal disorder     kidney cancer  . LBBB (left bundle branch block)   . Paroxysmal atrial fibrillation   . Sleep apnea     Medications:  Scheduled:  . aspirin EC  81 mg Oral Daily  . atenolol  50 mg Oral Daily  . [COMPLETED] coumadin book   Does not apply Once  . diltiazem  120 mg Oral Daily  . enoxaparin (LOVENOX) injection  1 mg/kg Subcutaneous Q12H  . glipiZIDE  5 mg Oral QAC breakfast  . insulin glargine  40 Units Subcutaneous QHS  . isosorbide mononitrate  60 mg Oral Daily  . lisinopril  20 mg Oral Daily  . methadone  20 mg Oral BID  . pantoprazole  40 mg Oral Daily  . PARoxetine  20 mg Oral Daily  . [COMPLETED] warfarin  10 mg Oral ONCE-1800  . Warfarin - Pharmacist Dosing Inpatient   Does not apply q1800  . [DISCONTINUED] enoxaparin (LOVENOX) injection  1 mg/kg Subcutaneous Q12H  . [DISCONTINUED] metFORMIN  500 mg Oral Q breakfast  . [DISCONTINUED] metFORMIN  500 mg Oral BID WC    Assessment: OK for  protocol.  Estimated Creatinine Clearance: 43.5 ml/min (by C-G formula based on Cr of 1.76).  Goal of Therapy:  INR 2-3   Plan:  Lovenox to 95mg  SQ q12 hours. PT/INR Daily. Warfarin 7.5 mg PO x 1 today. Complete education.  Lamonte Richer R 08/23/2012,9:57 AM

## 2012-08-23 NOTE — Progress Notes (Signed)
Hypoglycemic Event  CBG: 59  Treatment: 15 GM carbohydrate snack  Symptoms: None  Follow-up CBG: Time: CBG Result:  Possible Reasons for Event: Unknown     [name]  Remember to initiate Hypoglycemia Order Set & complete

## 2012-08-23 NOTE — Progress Notes (Signed)
NAMEAMIT, LEECE                ACCOUNT NO.:  [id number]  MEDICAL RECORD NO.:  [id number]  LOCATION:  A308                          FACILITY:  [facility]  PHYSICIAN:  Melvyn Novas, MDDATE OF BIRTH:  [birth year]  DATE OF PROCEDURE: DATE OF DISCHARGE:                                PROGRESS NOTE   The patient has paroxysmal atrial fibrillation now converted to sinus rhythm, hypertension, hyperlipidemia, insulin-dependent diabetes, total illiteracy, and difficulty following compliance with medicines, some chronic renal failure with creatinine of 1.76.  The patient has been in sinus rhythm after amiodarone infusion for 36 hours.  No angina. Troponins are negative serially.  PHYSICAL EXAMINATION:  VITAL SIGNS:  Blood pressure is 105/54, pulse is 75 and regular, respiratory rate is 13, O2 saturation 91%. LUNGS:  Clear with diminished breath sounds at bases.  No rales, wheeze, or rhonchi. HEART:  Regular rhythm.  No S3, S4.  PLAN:  Right now is to continue Coumadin, awaiting therapeutic per pharmacy protocol.  Continue atenolol 50 as well as Cardizem CD 120 daily.  The patient appears hemodynamically stable, in good glycemic control on Lantus 40 units at bedtime.  Random glucose is averaging 100. We will follow up in a.m.  Continue Coumadin.     Melvyn Novas, MD     RMD/MEDQ  D:  08/23/2012  T:  08/23/2012  Job:  [id number]

## 2012-08-23 NOTE — Progress Notes (Addendum)
Hypoglycemic Event  CBG: 66  Treatment: Ice cream and coke for 66 BS.  Gave oral glucose gel after repeat CBG of 47.   Symptoms: Diaphoretic, confused.   Follow-up CBG: Time: 0845 CBG Result:47  Possible Reasons for Event: Has had several episodes today.   Comments/MD notified:Paged dr Donn Pierini, Nena Polio  Remember to initiate Hypoglycemia Order Set & complete

## 2012-08-24 LAB — BASIC METABOLIC PANEL
BUN: 37 mg/dL — ABNORMAL HIGH (ref 6–23)
CO2: 25 mEq/L (ref 19–32)
Calcium: 8.9 mg/dL (ref 8.4–10.5)
GFR calc non Af Amer: 26 mL/min — ABNORMAL LOW (ref >90)
Glucose, Bld: 96 mg/dL (ref 70–99)
Sodium: 136 mEq/L (ref 135–145)

## 2012-08-24 LAB — PROTIME-INR: INR: 1.39 (ref 0.00–1.49)

## 2012-08-24 LAB — HEPATIC FUNCTION PANEL
AST: 10 U/L (ref 0–37)
Albumin: 3.6 g/dL (ref 3.5–5.2)
Alkaline Phosphatase: 72 U/L (ref 39–117)
Bilirubin, Direct: <0.1 mg/dL (ref 0.0–0.3)
Total Bilirubin: 0.2 mg/dL — ABNORMAL LOW (ref 0.3–1.2)

## 2012-08-24 LAB — CBC
HCT: 34 % — ABNORMAL LOW (ref 39.0–52.0)
Hemoglobin: 10.7 g/dL — ABNORMAL LOW (ref 13.0–17.0)
MCH: 24.3 pg — ABNORMAL LOW (ref 26.0–34.0)
MCHC: 31.5 g/dL (ref 30.0–36.0)
RDW: 16.4 % — ABNORMAL HIGH (ref 11.5–15.5)

## 2012-08-24 LAB — GLUCOSE, CAPILLARY
Glucose-Capillary: 111 mg/dL — ABNORMAL HIGH (ref 70–99)
Glucose-Capillary: 106 mg/dL — ABNORMAL HIGH (ref 70–99)
Glucose-Capillary: 133 mg/dL — ABNORMAL HIGH (ref 70–99)

## 2012-08-24 MED ORDER — WARFARIN SODIUM 7.5 MG PO TABS
7.5000 mg | ORAL_TABLET | Freq: Once | ORAL | Status: AC
  Administered 2012-08-24: 7.5 mg via ORAL
  Filled 2012-08-24: qty 1

## 2012-08-24 NOTE — Clinical Documentation Improvement (Signed)
RENAL FAILURE DOCUMENTATION CLARIFICATION QUERY  THIS DOCUMENT IS NOT A PERMANENT PART OF THE MEDICAL RECORD  TO RESPOND TO THE THIS QUERY, FOLLOW THE INSTRUCTIONS BELOW:  1. If needed, update documentation for the patient's encounter via the notes activity.  2. Access this query again and click edit on the In [employer].  3. After updating, or not, click F2 to complete all highlighted (required) fields concerning your review. Select "additional documentation in the medical record" OR "no additional documentation provided".  4. Click Sign note button.  5. The deficiency will fall out of your In Basket *Please let us know if you are not able to complete this workflow by phone or e-mail (listed below).  Please update your documentation within the medical record to reflect your response to this query.                                                                                    08/24/12  Dear Dr. Janna Arch,  In a better effort to capture your patient's severity of illness, reflect appropriate length of stay and utilization of resources, a review of the patient medical record has revealed the following indicators.    Based on your clinical judgment, please clarify and document in a progress note and/or discharge summary the clinical condition associated with the following supporting information:  In responding to this query please exercise your independent judgment.  The fact that a query is asked, does not imply that any particular answer is desired or expected.  Please clarify renal status. Thank you.  Possible Clinical Conditions?   Acute Renal Failure Acute Kidney Injury Acute Tubular Necrosis Acute on Chronic Renal Failure Renal Failure Other Condition_____________  Clinically Determine   Clinical Information:  Risk Factors: Hypertension Diabetes 3/16 progress note "some chronic renal failure with creatinine of 1.76."  Signs and Symptoms: Low urine  output  Diagnostics: -Lab: BUN/CR/GFR 03/09/12 = 12/1.30/55 08/22/12 = 20/1.52/45 08/23/12 = 29/1.76/38 08/24/12 = 37/2.40/26  Treatments: Monitoring BMP Monitoring I & O                   You may use possible, probable, or suspect with inpatient documentation. possible, probable, suspected diagnoses MUST be documented at the time of discharge  Reviewed: Answer found in chart written by both Dr. Janna Arch & Dr. Juanetta Gosling: Acute renal failure.-CC Thank You,  Harless Litten RN, MSN Clinical Documentation Specialist: Office# [phone] [facility] Health Information Management [place]

## 2012-08-24 NOTE — Progress Notes (Signed)
[id number] 

## 2012-08-24 NOTE — Progress Notes (Signed)
Inpatient Diabetes Program Recommendations  AACE/ADA: New Consensus Statement on Inpatient Glycemic Control (2013)  Target Ranges:  Prepandial:   less than 140 mg/dL      Peak postprandial:   less than 180 mg/dL (1-2 hours)      Critically ill patients:  140 - 180 mg/dL   Results for SADLER, TESCHNER (MRN [id number]) as of 08/24/2012 10:41  Ref. Range 08/23/2012 07:33 08/23/2012 11:21 08/23/2012 16:41 08/23/2012 17:11 08/23/2012 17:36 08/23/2012 20:18 08/23/2012 21:01 08/23/2012 21:25 08/23/2012 21:54 08/23/2012 23:57 08/24/2012 04:03 08/24/2012 07:53  Glucose-Capillary Latest Range: 70-99 mg/dL 56 (L) 82 51 (L) 63 (L) 75 66 (L) 47 (L) 49 (L) 156 (H) 101 (H) 111 (H) 118 (H)     Note: Patient has a history of diabetes and takes Lantus 40 units QHS, Metformin 500mg  BID, and Glipizide 5mg  QAM at home for diabetes management.  Currently, patient is not ordered to receive any medications for inpatient glycemic control.  According to the chart patient was ordered to receive Lantus 40 units QHS (last given on 3/15 @ 23:12), Glipizide 5mg  daily (last given on 3/16 @ 8:26), and Metformin 500mg  BID (last given on 3/16 @ 8:26); however all diabetic medications were discontinued due to hypoglycemic events.  It was noted that patient's fasting blood glucose was 56 mg/dl on 4/09 @7 :33 am and patient was treated with 15 grams of carbohydrates then patient was given Glipizide 5mg   and Metformin 500 mg at 8:26 am.  Lab values indicate renal insufficiency; therefore, renal insufficiency contributed to hypoglycemia.  Oral diabetic agents were discontinued as was the Lantus.  Patient will likely need to be restarted on basal insulin, but recommend restarting at a lower dose than used at home.  Would not recommend that oral agents be given as an inpatient.  Recommend restarting Lantus at 20 units QHS when blood glucose consistently greater than 150 mg/dl without treatment.  Will continue to follow.  Thanks, Orlando Penner, RN, BSN,  CCRN Diabetes Coordinator Inpatient Diabetes Program [phone]

## 2012-08-24 NOTE — Care Management Note (Signed)
    Page 1 of 2   09/07/2012     1:44:23 PM   CARE MANAGEMENT NOTE 09/07/2012  Patient:  BILLEY, WOJCIAK A   Account Number:  [id number]  Date Initiated:  08/24/2012  Documentation initiated by:  Sharrie Rothman  Subjective/Objective Assessment:   Pt admitted from home with a fib. Pt lives alone and has a friend who lives with him. Pt has a cpap for home use. Pt will return home at discharge.     Action/Plan:   Pt may benefit from [facility] at discharge. Pt will need coumadin and lovenox at discharge.   Anticipated DC Date:  08/26/2012   Anticipated DC Plan:  HOME W HOME HEALTH SERVICES      DC Planning Services  CM consult      Choice offered to / List presented to:  C-1 Patient   DME arranged  OXYGEN  NEBULIZER MACHINE  WALKER - ROLLING      DME agency  [facility]     HH arranged  HH-1 RN  HH-2 PT  HH-7 RESPIRATORY THERAPY      HH agency  [facility]   Status of service:  Completed, signed off Medicare Important Message given?  YES (If response is "NO", the following Medicare IM given date fields will be blank) Date Medicare IM given:  09/07/2012 Date Additional Medicare IM given:    Discharge Disposition:  HOME W HOME HEALTH SERVICES  Per UR Regulation:    If discussed at Long Length of Stay Meetings, dates discussed:   08/27/2012  09/01/2012  09/03/2012    Comments:  09/07/12 1340 Darien Mignogna, RN BSN CM Pt discharged home today with [facility] RN, PT, and RT. Alroy Bailiff of [facility] is aware and will collect the pts information from the chart. Pt also needs neb machine, walker, and O2 with [facility]. Tula Nakayama of [facility] is aware and will deliver portable O2 and walker prior to discharge. Pts neb machine will be delivered to pts home along with rest of O2 supplies. HH services to start within 48 hours. Pt and pts nurse aware of discharge arrangements.  09/04/12 1030 Geneva Bolden RN Pt will be going home and friend will be staying with him and assisting  with his care. He has no preference for [facility] and agrees with [facility]. He also will probably need O2 and a nebulizer. Sats will be checked at D/C 09/02/12 1505 Arlyss Queen, RN BSN CM Pt more alert and definitely more oriented today. CM encouraged pt to work with PT to assess discharge needs. Pt has agreed to [facility] at discharge.  08/27/12 1515 Arlyss Queen, RN BSN CM Pt in ICU due to respiratory distress. Will continue to follow for discharge disposition.  08/24/12 1345 Arlyss Queen, RN BSN CM

## 2012-08-24 NOTE — Progress Notes (Signed)
UR Chart Review Completed  

## 2012-08-24 NOTE — Progress Notes (Addendum)
Blood sugar stable at 101. Cpap in use,

## 2012-08-24 NOTE — Progress Notes (Signed)
ANTICOAGULATION CONSULT NOTE  Pharmacy Consult for Lovenox & Warfarin Indication: Afib  No Known Allergies  Patient Measurements: Height: 5' 7.5" (171.5 cm) Weight: 205 lb 0.4 oz (93 kg) IBW/kg (Calculated) : 67.25 Body mass index is 31.62 kg/(m^2).  Vital Signs: Temp: 99.6 F (37.6 C) (03/17 0545) Temp src: Oral (03/17 0545) BP: 109/67 mmHg (03/17 1008) Pulse Rate: 76 (03/17 1008)  Labs:  Recent Labs  08/21/12 1219 08/21/12 1506 08/21/12 2040 08/22/12 0245 08/23/12 0600 08/24/12 0459  HGB 12.5*  --   --   --  10.7* 10.7*  HCT 39.3  --   --   --  34.2* 34.0*  PLT 312  --   --   --  278 269  LABPROT 12.9  --   --  13.6 15.1 16.7*  INR 0.98  --   --  1.05 1.21 1.39  CREATININE 1.18  --   --  1.52* 1.76* 2.40*  TROPONINI <0.30 <0.30 <0.30 <0.30  --   --    Estimated Creatinine Clearance: 31.9 ml/min (by C-G formula based on Cr of 2.4).  Medical History: Past Medical History  Diagnosis Date  . Coronary artery disease   . Hypertension   . Cancer   . Pneumonia   . CHF (congestive heart failure)   . High cholesterol   . Diabetes mellitus   . Hepatitis   . Renal disorder     kidney cancer  . LBBB (left bundle branch block)   . Paroxysmal atrial fibrillation   . Sleep apnea    Medications:  Scheduled:  . aspirin EC  81 mg Oral Daily  . atenolol  50 mg Oral Daily  . [COMPLETED] dextrose      . [COMPLETED] dextrose  1 ampule Intravenous Once  . diltiazem  120 mg Oral Daily  . enoxaparin (LOVENOX) injection  1 mg/kg Subcutaneous Q12H  . isosorbide mononitrate  60 mg Oral Daily  . lisinopril  20 mg Oral Daily  . methadone  20 mg Oral BID  . pantoprazole  40 mg Oral Daily  . PARoxetine  20 mg Oral Daily  . [COMPLETED] warfarin  7.5 mg Oral ONCE-1800  . warfarin  7.5 mg Oral Once  . Warfarin - Pharmacist Dosing Inpatient   Does not apply q1800  . [DISCONTINUED] glipiZIDE  5 mg Oral QAC breakfast  . [DISCONTINUED] insulin glargine  40 Units Subcutaneous QHS    Assessment: OK for protocol.  INR still below goal but trending upwards. Estimated Creatinine Clearance: 31.9 ml/min (by C-G formula based on Cr of 2.4).  Goal of Therapy:  INR 2-3   Plan:  Lovenox to 95mg  SQ q12 hours. PT/INR Daily. Warfarin 7.5 mg PO x 1 today. Complete education.  Margo Aye, Ashani Pumphrey A 08/24/2012,10:50 AM

## 2012-08-25 ENCOUNTER — Inpatient Hospital Stay (HOSPITAL_COMMUNITY): Payer: Medicare Other

## 2012-08-25 LAB — CBC WITH DIFFERENTIAL/PLATELET
Basophils Relative: 0 % (ref 0–1)
Eosinophils Absolute: 0.3 10*3/uL (ref 0.0–0.7)
Hemoglobin: 9.8 g/dL — ABNORMAL LOW (ref 13.0–17.0)
Lymphs Abs: 1.2 10*3/uL (ref 0.7–4.0)
MCH: 24.4 pg — ABNORMAL LOW (ref 26.0–34.0)
Monocytes Relative: 11 % (ref 3–12)
Neutro Abs: 4.2 10*3/uL (ref 1.7–7.7)
Neutrophils Relative %: 66 % (ref 43–77)
Platelets: 261 10*3/uL (ref 150–400)
RBC: 4.02 MIL/uL — ABNORMAL LOW (ref 4.22–5.81)

## 2012-08-25 LAB — GLUCOSE, CAPILLARY: Glucose-Capillary: 119 mg/dL — ABNORMAL HIGH (ref 70–99)

## 2012-08-25 LAB — URINE MICROSCOPIC-ADD ON

## 2012-08-25 LAB — URINALYSIS, ROUTINE W REFLEX MICROSCOPIC
Glucose, UA: NEGATIVE mg/dL
Leukocytes, UA: NEGATIVE
Nitrite: NEGATIVE
Specific Gravity, Urine: >1.03 — ABNORMAL HIGH (ref 1.005–1.030)
pH: 5.5 (ref 5.0–8.0)

## 2012-08-25 LAB — PROTIME-INR
INR: 1.84 — ABNORMAL HIGH (ref 0.00–1.49)
Prothrombin Time: 20.6 seconds — ABNORMAL HIGH (ref 11.6–15.2)

## 2012-08-25 MED ORDER — DILTIAZEM HCL ER COATED BEADS 120 MG PO CP24: 120.0000 mg | ORAL_CAPSULE | Freq: Every day | ORAL | Status: DC

## 2012-08-25 MED ORDER — SULFAMETHOXAZOLE-TRIMETHOPRIM 400-80 MG PO TABS
1.0000 | ORAL_TABLET | Freq: Two times a day (BID) | ORAL | Status: DC
  Filled 2012-08-25 (×2): qty 1

## 2012-08-25 MED ORDER — WARFARIN SODIUM 5 MG PO TABS
5.0000 mg | ORAL_TABLET | Freq: Once | ORAL | Status: AC
  Administered 2012-08-25: 5 mg via ORAL
  Filled 2012-08-25: qty 1

## 2012-08-25 MED ORDER — ASPIRIN EC 325 MG PO TBEC
325.0000 mg | DELAYED_RELEASE_TABLET | Freq: Every day | ORAL | Status: DC
  Administered 2012-08-26 – 2012-09-07 (×11): 325 mg via ORAL
  Filled 2012-08-25 (×11): qty 1

## 2012-08-25 MED ORDER — SULFAMETHOXAZOLE-TMP DS 800-160 MG PO TABS
1.0000 | ORAL_TABLET | Freq: Two times a day (BID) | ORAL | Status: DC
  Administered 2012-08-25: 1 via ORAL
  Filled 2012-08-25: qty 1

## 2012-08-25 MED ORDER — WARFARIN SODIUM 5 MG PO TABS: 5.0000 mg | ORAL_TABLET | Freq: Once | ORAL | Status: DC

## 2012-08-25 NOTE — Progress Notes (Signed)
Pt is slow to respond, when he wakes up he doesn't know where he is.  Was unable to feed himself his lunch, had to be assisted. VS are stable, BG WNL.  Notified MD of mental status change.

## 2012-08-25 NOTE — Progress Notes (Signed)
Joseph, Bobby                ACCOUNT NO.:  [id number]  MEDICAL RECORD NO.:  [id number]  LOCATION:  A308                          FACILITY:  [facility]  PHYSICIAN:  Melvyn Novas, MDDATE OF BIRTH:  02-Feb-1941  DATE OF PROCEDURE: DATE OF DISCHARGE:                                PROGRESS NOTE   The patient has paroxysmal AFib, now reverted to sinus rhythm.  The patient is totally illiterate, confusion with medicines, started on Coumadin, not quite therapeutic.  Has some chronic renal failure, creatinine 2.40.  Insulin-dependent diabetes, hypertension, hyperlipidemia.  Five coronary stents placed on 2 separate occasions. Details unknown at present.  Blood pressure 109/67, temperature 99.6, respiratory rate is 20, pulse 76 and regular.  PLAN:  Right now is to continue on Coumadin.  Probably discharge the patient tomorrow with home health care to follow and ensure compliance with medicines such as Coumadin, and I will monitor PT/INR in office as well as renal function.     Melvyn Novas, MD     RMD/MEDQ  D:  08/24/2012  T:  08/25/2012  Job:  [id number]

## 2012-08-25 NOTE — Progress Notes (Signed)
MD notified of UA results, orders given.  Pt encouraged to eat dinner, friend at bedside helping him eat.  Pt remains disoriented and has poor attention/concentration.

## 2012-08-25 NOTE — Accreditation Note (Signed)
[id number] 

## 2012-08-25 NOTE — Progress Notes (Signed)
In and out cath performed, sterile technique, immediately got back approximately .  Sample sent to lab.

## 2012-08-25 NOTE — Discharge Summary (Signed)
[id number] 

## 2012-08-25 NOTE — Progress Notes (Addendum)
CT scan results called to Dr. Janna Arch, orders given. Will consult Neurology.  Also, notified MD that pt has not voided today.  Order for I&O cath and UA/UC ordered as well.

## 2012-08-25 NOTE — Progress Notes (Signed)
ANTICOAGULATION CONSULT NOTE  Pharmacy Consult for Lovenox & Warfarin Indication: Afib  No Known Allergies  Patient Measurements: Height: 5' 7.5" (171.5 cm) Weight: 205 lb 0.4 oz (93 kg) IBW/kg (Calculated) : 67.25 Body mass index is 31.62 kg/(m^2).  Vital Signs: Temp: 97.1 F (36.2 C) (03/18 0544) Temp src: Oral (03/18 0544) BP: 99/47 mmHg (03/18 0929) Pulse Rate: 73 (03/18 0929)  Labs:  Recent Labs  08/23/12 0600 08/24/12 0459 08/25/12 0437  HGB 10.7* 10.7*  --   HCT 34.2* 34.0*  --   PLT 278 269  --   LABPROT 15.1 16.7* 20.6*  INR 1.21 1.39 1.84*  CREATININE 1.76* 2.40*  --    Estimated Creatinine Clearance: 31.9 ml/min (by C-G formula based on Cr of 2.4).  Medical History: Past Medical History  Diagnosis Date  . Coronary artery disease   . Hypertension   . Cancer   . Pneumonia   . CHF (congestive heart failure)   . High cholesterol   . Diabetes mellitus   . Hepatitis   . Renal disorder     kidney cancer  . LBBB (left bundle branch block)   . Paroxysmal atrial fibrillation   . Sleep apnea    Medications:  Scheduled:  . aspirin EC  81 mg Oral Daily  . atenolol  50 mg Oral Daily  . diltiazem  120 mg Oral Daily  . enoxaparin (LOVENOX) injection  1 mg/kg Subcutaneous Q12H  . isosorbide mononitrate  60 mg Oral Daily  . lisinopril  20 mg Oral Daily  . methadone  20 mg Oral BID  . pantoprazole  40 mg Oral Daily  . PARoxetine  20 mg Oral Daily  . warfarin  5 mg Oral ONCE-1800  . [COMPLETED] warfarin  7.5 mg Oral Once  . Warfarin - Pharmacist Dosing Inpatient   Does not apply q1800   Assessment: OK for protocol.  INR still below goal but trending upwards. Estimated Creatinine Clearance: 31.9 ml/min (by C-G formula based on Cr of 2.4).  Goal of Therapy:  INR 2-3   Plan:  Lovenox to 95mg  SQ q12 hours. PT/INR Daily. Warfarin 5 mg PO x 1 today. Complete education.  Margo Aye, Ewald Beg A 08/25/2012,10:11 AM

## 2012-08-26 ENCOUNTER — Inpatient Hospital Stay (HOSPITAL_COMMUNITY): Payer: Medicare Other

## 2012-08-26 DIAGNOSIS — I4891 Unspecified atrial fibrillation: Secondary | ICD-10-CM

## 2012-08-26 LAB — BLOOD GAS, ARTERIAL
Acid-base deficit: 4.3 mmol/L — ABNORMAL HIGH (ref 0.0–2.0)
Acid-base deficit: 4.3 mmol/L — ABNORMAL HIGH (ref 0.0–2.0)
Delivery systems: POSITIVE
Drawn by: 21310
FIO2: 100 %
Inspiratory PAP: 16
Mode: POSITIVE
O2 Saturation: 96.9 %
Patient temperature: 37
Patient temperature: 37
TCO2: 22.3 mmol/L (ref 0–100)
pCO2 arterial: 70.3 mmHg (ref 35.0–45.0)
pCO2 arterial: 54.9 mmHg — ABNORMAL HIGH (ref 35.0–45.0)

## 2012-08-26 LAB — URINE CULTURE

## 2012-08-26 LAB — GLUCOSE, CAPILLARY
Glucose-Capillary: 129 mg/dL — ABNORMAL HIGH (ref 70–99)
Glucose-Capillary: 135 mg/dL — ABNORMAL HIGH (ref 70–99)
Glucose-Capillary: 133 mg/dL — ABNORMAL HIGH (ref 70–99)

## 2012-08-26 LAB — URINALYSIS, ROUTINE W REFLEX MICROSCOPIC
Ketones, ur: 15 mg/dL — AB
Nitrite: POSITIVE — AB
pH: 7 (ref 5.0–8.0)

## 2012-08-26 LAB — SEDIMENTATION RATE: Sed Rate: 38 mm/hr — ABNORMAL HIGH (ref 0–16)

## 2012-08-26 LAB — URINE MICROSCOPIC-ADD ON

## 2012-08-26 LAB — CBC
HCT: 34 % — ABNORMAL LOW (ref 39.0–52.0)
Hemoglobin: 10.9 g/dL — ABNORMAL LOW (ref 13.0–17.0)
MCH: 24.4 pg — ABNORMAL LOW (ref 26.0–34.0)
MCV: 76.2 fL — ABNORMAL LOW (ref 78.0–100.0)
RBC: 4.46 MIL/uL (ref 4.22–5.81)

## 2012-08-26 LAB — BASIC METABOLIC PANEL
BUN: 54 mg/dL — ABNORMAL HIGH (ref 6–23)
CO2: 24 mEq/L (ref 19–32)
Calcium: 8.6 mg/dL (ref 8.4–10.5)
Creatinine, Ser: 3.55 mg/dL — ABNORMAL HIGH (ref 0.50–1.35)
Glucose, Bld: 137 mg/dL — ABNORMAL HIGH (ref 70–99)

## 2012-08-26 LAB — PROTEIN, URINE, RANDOM: Total Protein, Urine: 216 mg/dL

## 2012-08-26 LAB — RETICULOCYTES
RBC.: 4.42 MIL/uL (ref 4.22–5.81)
Retic Count, Absolute: 44.2 10*3/uL (ref 19.0–186.0)

## 2012-08-26 LAB — CREATININE, URINE, RANDOM: Creatinine, Urine: 261.8 mg/dL

## 2012-08-26 LAB — SODIUM, URINE, RANDOM: Sodium, Ur: 22 mEq/L

## 2012-08-26 LAB — PHOSPHORUS: Phosphorus: 5.6 mg/dL — ABNORMAL HIGH (ref 2.3–4.6)

## 2012-08-26 MED ORDER — PIPERACILLIN-TAZOBACTAM 3.375 G IVPB
3.3750 g | Freq: Three times a day (TID) | INTRAVENOUS | Status: DC
  Administered 2012-08-26 – 2012-08-30 (×11): 3.375 g via INTRAVENOUS
  Filled 2012-08-26 (×17): qty 50

## 2012-08-26 MED ORDER — ALBUTEROL SULFATE (5 MG/ML) 0.5% IN NEBU
INHALATION_SOLUTION | RESPIRATORY_TRACT | Status: AC
  Filled 2012-08-26: qty 0.5

## 2012-08-26 MED ORDER — ALBUTEROL SULFATE (5 MG/ML) 0.5% IN NEBU
2.5000 mg | INHALATION_SOLUTION | RESPIRATORY_TRACT | Status: DC
  Administered 2012-08-26 – 2012-08-29 (×20): 2.5 mg via RESPIRATORY_TRACT
  Filled 2012-08-26 (×21): qty 0.5

## 2012-08-26 MED ORDER — IPRATROPIUM BROMIDE 0.02 % IN SOLN
0.5000 mg | RESPIRATORY_TRACT | Status: DC
  Administered 2012-08-26 – 2012-08-29 (×21): 0.5 mg via RESPIRATORY_TRACT
  Filled 2012-08-26 (×22): qty 2.5

## 2012-08-26 MED ORDER — WARFARIN SODIUM 5 MG IV SOLR
5.0000 mg | Freq: Once | INTRAVENOUS | Status: AC
  Filled 2012-08-26: qty 2.5

## 2012-08-26 MED ORDER — DEXTROSE 5 % IV SOLN
INTRAVENOUS | Status: AC
  Filled 2012-08-26: qty 10

## 2012-08-26 MED ORDER — WARFARIN - PHARMACIST DOSING INPATIENT: Freq: Every day | Status: DC

## 2012-08-26 MED ORDER — SODIUM CHLORIDE 0.9 % IV SOLN
INTRAVENOUS | Status: DC
  Administered 2012-08-26: 12:00:00 via INTRAVENOUS
  Administered 2012-08-27: 1000 mL via INTRAVENOUS
  Administered 2012-08-27 – 2012-08-31 (×7): via INTRAVENOUS
  Administered 2012-09-04: 10 mL/h via INTRAVENOUS

## 2012-08-26 MED ORDER — WARFARIN SODIUM 5 MG PO TABS: 5.0000 mg | ORAL_TABLET | Freq: Once | ORAL | Status: DC

## 2012-08-26 MED ORDER — VANCOMYCIN HCL IN DEXTROSE 1-5 GM/200ML-% IV SOLN
1000.0000 mg | INTRAVENOUS | Status: DC
  Administered 2012-08-26 – 2012-08-28 (×2): 1000 mg via INTRAVENOUS
  Filled 2012-08-26 (×3): qty 200

## 2012-08-26 MED ORDER — IPRATROPIUM-ALBUTEROL 0.5-2.5 (3) MG/3ML IN SOLN: 3.0000 mL | RESPIRATORY_TRACT | Status: DC

## 2012-08-26 MED ORDER — DEXTROSE 5 % IV SOLN
1.0000 g | Freq: Every day | INTRAVENOUS | Status: DC
  Administered 2012-08-26: 1 g via INTRAVENOUS
  Filled 2012-08-26: qty 10

## 2012-08-26 MED ORDER — FUROSEMIDE 10 MG/ML IJ SOLN: 40.0000 mg | Freq: Two times a day (BID) | INTRAMUSCULAR | Status: DC

## 2012-08-26 MED ORDER — LORAZEPAM 2 MG/ML IJ SOLN
1.0000 mg | Freq: Once | INTRAMUSCULAR | Status: AC
  Administered 2012-08-26: 1 mg via INTRAMUSCULAR
  Filled 2012-08-26: qty 1

## 2012-08-26 MED ORDER — ALBUTEROL SULFATE (5 MG/ML) 0.5% IN NEBU
2.5000 mg | INHALATION_SOLUTION | RESPIRATORY_TRACT | Status: DC
  Administered 2012-08-26 (×3): 2.5 mg via RESPIRATORY_TRACT
  Filled 2012-08-26 (×2): qty 0.5

## 2012-08-26 MED ORDER — WARFARIN SODIUM 5 MG PO TABS
5.0000 mg | ORAL_TABLET | Freq: Once | ORAL | Status: AC
  Administered 2012-08-26: 5 mg via ORAL
  Filled 2012-08-26: qty 1

## 2012-08-26 NOTE — Progress Notes (Signed)
Pt pulled out peripheral IV and is refusing to let us start another one. Called Dr. Janna Arch and he ordered IM ativan 1 mg stat and wrist restraints.

## 2012-08-26 NOTE — Progress Notes (Signed)
[name], [name]                ACCOUNT NO.:  [id number]  MEDICAL RECORD NO.:  [id number]  LOCATION:  IC06                          FACILITY:  [facility]  PHYSICIAN:  Melvyn Novas, MDDATE OF BIRTH:  Oct 14, 1940  DATE OF PROCEDURE: DATE OF DISCHARGE:                                PROGRESS NOTE   The patient had altered mental status.  Discharge were canceled yesterday.  Urinalysis revealed evidence of UTI.  Blood cultures were drawn yesterday.  CT scan revealed a remote infarct but no evidence of acute intracranial bleed, therefore Coumadin was stopped and Coumadin was resumed after results of CAT scan.  Plavix and aspirin were put on hold as he has achieved an INR of 2.0 today.  The patient has coronary artery disease, multiple stents, insulin-dependent diabetes.  Last night, he vomited about 1 in the morning and had worsened altered sensorium.  Chest x-ray revealed evidence of right upper lobe infiltrate, which is a new development and possible aspiration per on- call physician.  He is transferred to ICU, where upon he does recognizes me.  He knows his name but he is disoriented to person and place. Again, he does not spontaneously move his right arm as readily as is left, however he does have an IV.  Plantars are downgoing bilaterally. He moves all other extremities.  Cranial nerves II through XII grossly intact.  Blood pressure is 114/76, pulse is 102 and regular, sinus temperature is 99, O2 sat currently is 98%.  His blood gas in the last night brought up an hour ago revealed evidence of elevated CO2.  PO2 was 180, PCO2 was 70, O2 sat was 99%.  Likewise, this morning's creatinine reveals a climbing creatinine of 3.55, was 1.76 on admission.  He has acute renal failure, altered sensorium, remote infarct per CT.  New interval development of right upper lobe infiltrate.  UTI yesterday. Blood cultures were drawn.  INR is currently 2.06.  Lungs show good breath sounds  bilaterally.  Diminished breath sounds at the bases.  No rales, no rhonchi appreciable.  Heart regular rhythm.  No S3-S4 gallops. No heaves, thrills, or rubs. Abdomen:  Soft, nontender.  Bowel sounds normoactive.  Extremities:  No clubbing, cyanosis, or edema.  Neurologic as mentioned previously. Differential diagnosis at present is altered mental status.  ASSESSMENT: 1. Possible delirium. 2. Urinary tract infection. 3. Right upper lobe infiltrate, possible aspiration versus healthcare     acquired pneumonia. 4. Septicemia. 5. Acute renal failure with climbing creatinine 3.55. 6. Insulin-dependent diabetes. 7. Anticoagulation and status post paroxysmal AFib, currently in sinus     rhythm.  PLAN: 1. Right now is to add vanc and Zosyn.  Blood cultures were drawn     yesterday afternoon.  Obtain Pulmonary consultation to assess     degree and type of pneumonia and further treatment plans.  Obtain     nephrology consult     for acute renal failure possibly secondary to long-standing     diabetes. 2. Awaiting neurology consult requested yesterday 3. We will place the patient on BiPAP and repeat gas in 1 hour.     Melvyn Novas, MD  RMD/MEDQ  D:  08/26/2012  T:  08/26/2012  Job:  [id number]

## 2012-08-26 NOTE — Progress Notes (Signed)
NAMEERCIL, CASSIS                ACCOUNT NO.:  [id number]  MEDICAL RECORD NO.:  [id number]  LOCATION:                                 FACILITY:  PHYSICIAN:  Melvyn Novas, MDDATE OF BIRTH:  09-Aug-1940  DATE OF PROCEDURE:  08/25/2012 DATE OF DISCHARGE:                                PROGRESS NOTE   Just as we are beginning to discharge, Mr. Nyland with some behavioral changes, some confusion, altered mental status, inability to follow commands.  He refuses to raise his right arm.  His right leg does move. Plantars are downgoing bilaterally.  The left arm was fine.  Cranial nerves II through XII otherwise grossly intact.  The patient's blood sugars were normal.  He is hemodynamically stable.  Blood pressure 117/65, temperature 97.1, pulse 76 and regular, respiratory rate is 20. I was planning to discharge the patient now.  We will reverse course and obtain CT scan to rule out any possibility of impending CVA.  We will hold Coumadin for now, and make further recommendations as the database expands.     Melvyn Novas, MD     RMD/MEDQ  D:  08/25/2012  T:  08/26/2012  Job:  [id number]

## 2012-08-26 NOTE — Progress Notes (Signed)
*  PRELIMINARY RESULTS* Echocardiogram 2D Echocardiogram has been performed.  [name] 08/26/2012, 12:10 PM

## 2012-08-26 NOTE — Progress Notes (Signed)
Dr. Janna Arch said to give methadone for headache that pt could not take this morning.

## 2012-08-26 NOTE — Progress Notes (Signed)
Called Dr. Oval Linsey because patient is not able to swallow meds at this time. It took several attempts to get him to swallow and he was only able to swallow a few pills and did not feel at this time it was safe to continue. Dr. Janna Arch ordered to hold oral meds at this time until we further evaluate his mental status.

## 2012-08-26 NOTE — Progress Notes (Signed)
[id number] 

## 2012-08-26 NOTE — Progress Notes (Signed)
Pt refused to take medication  

## 2012-08-26 NOTE — Progress Notes (Signed)
Called Dr. Janna Arch back and told him pt refused to take medication and Dr. Janna Arch said that he would not order any IV pain medications due to respiration status.

## 2012-08-26 NOTE — Progress Notes (Signed)
ANTICOAGULATION CONSULT NOTE  Pharmacy Consult for Lovenox & Warfarin Indication: Afib  No Known Allergies  Patient Measurements: Height: 5' 7.5" (171.5 cm) Weight: 205 lb 0.4 oz (93 kg) IBW/kg (Calculated) : 67.25 Body mass index is 31.62 kg/(m^2).  Vital Signs: Temp: 99 F (37.2 C) (03/19 0758) Temp src: Oral (03/19 0758) BP: 159/113 mmHg (03/19 0758) Pulse Rate: 88 (03/19 1026)  Labs:  Recent Labs  08/24/12 0459 08/25/12 0437 08/25/12 1845 08/26/12 0450  HGB 10.7*  --  9.8* 10.9*  HCT 34.0*  --  30.5* 34.0*  PLT 269  --  261 260  LABPROT 16.7* 20.6*  --  22.4*  INR 1.39 1.84*  --  2.06*  CREATININE 2.40*  --   --  3.55*   Estimated Creatinine Clearance: 21.6 ml/min (by C-G formula based on Cr of 3.55).  Medical History: Past Medical History  Diagnosis Date  . Coronary artery disease   . Hypertension   . Cancer   . Pneumonia   . CHF (congestive heart failure)   . High cholesterol   . Diabetes mellitus   . Hepatitis   . Renal disorder     kidney cancer  . LBBB (left bundle branch block)   . Paroxysmal atrial fibrillation   . Sleep apnea    Medications:  Scheduled:  . ipratropium  0.5 mg Nebulization Q4H   And  . albuterol  2.5 mg Nebulization Q4H  . aspirin EC  325 mg Oral Daily  . atenolol  50 mg Oral Daily  . diltiazem  120 mg Oral Daily  . enoxaparin (LOVENOX) injection  1 mg/kg Subcutaneous Q12H  . isosorbide mononitrate  60 mg Oral Daily  . lisinopril  20 mg Oral Daily  . methadone  20 mg Oral BID  . pantoprazole  40 mg Oral Daily  . PARoxetine  20 mg Oral Daily  . piperacillin-tazobactam (ZOSYN)  IV  3.375 g Intravenous Q8H  . vancomycin  1,000 mg Intravenous Q48H  . [COMPLETED] warfarin  5 mg Oral ONCE-1800  . [DISCONTINUED] albuterol  2.5 mg Nebulization Q4H  . [DISCONTINUED] albuterol      . [DISCONTINUED] aspirin EC  81 mg Oral Daily  . [DISCONTINUED] cefTRIAXone (ROCEPHIN)  IV  1 g Intravenous QHS  . [DISCONTINUED]  ipratropium-albuterol  3 mL Nebulization Q4H  . [DISCONTINUED] sulfamethoxazole-trimethoprim  1 tablet Oral Q12H  . [DISCONTINUED] sulfamethoxazole-trimethoprim  1 tablet Oral Q12H  . [DISCONTINUED] Warfarin - Pharmacist Dosing Inpatient   Does not apply q1800   Assessment: [name] with new onset Afib currently on Lovenox-->warfarin bridge.   His Scr continues to increase.  No I/O recorded.  No bleeding noted.  INR is therapeutic today.   Goal of Therapy:  INR 2-3   Plan:  D/C Lovenox PT/INR daily Warfarin 5 mg PO x 1 today.  Elson Clan 08/26/2012,10:45 AM

## 2012-08-26 NOTE — Progress Notes (Signed)
Paged Dr. Janna Arch, pt complaining of bad headache cannot rate, but just says its bad.

## 2012-08-26 NOTE — Progress Notes (Signed)
ANTIBIOTIC CONSULT NOTE - INITIAL  Pharmacy Consult for Vancomycin & Zosyn Indication: pneumonia  No Known Allergies  Patient Measurements: Height: 5' 7.5" (171.5 cm) Weight: 205 lb 0.4 oz (93 kg) IBW/kg (Calculated) : 67.25  Vital Signs: Temp: 99 F (37.2 C) (03/19 0758) Temp src: Oral (03/19 0758) BP: 159/113 mmHg (03/19 0758) Pulse Rate: 88 (03/19 1026) Intake/Output from previous day: 03/18 0701 - 03/19 0700 In: 1410 [P.O.:710; I.V.:700] Out: 1000 [Urine:1000] Intake/Output from this shift:    Labs:  Recent Labs  08/24/12 0459 08/25/12 1845 08/26/12 0450  WBC 6.5 6.4 5.3  HGB 10.7* 9.8* 10.9*  PLT 269 261 260  CREATININE 2.40*  --  3.55*   Estimated Creatinine Clearance: 21.6 ml/min (by C-G formula based on Cr of 3.55). No results found for this basename: VANCOTROUGH, Leodis Binet, VANCORANDOM, GENTTROUGH, GENTPEAK, GENTRANDOM, TOBRATROUGH, TOBRAPEAK, TOBRARND, AMIKACINPEAK, AMIKACINTROU, AMIKACIN,  in the last 72 hours   Microbiology: Recent Results (from the past 720 hour(s))  MRSA PCR SCREENING     Status: None   Collection Time    08/21/12  6:45 PM      Result Value Range Status   MRSA by PCR NEGATIVE  NEGATIVE Final   Comment:            The GeneXpert MRSA Assay (FDA     approved for NASAL specimens     only), is one component of a     comprehensive MRSA colonization     surveillance program. It is not     intended to diagnose MRSA     infection nor to guide or     monitor treatment for     MRSA infections.  CULTURE, BLOOD (ROUTINE X 2)     Status: None   Collection Time    08/25/12  6:45 PM      Result Value Range Status   Specimen Description BLOOD LEFT ANTECUBITAL   Final   Special Requests BOTTLES DRAWN AEROBIC AND ANAEROBIC 8CC EACH   Final   Culture NO GROWTH 1 DAY   Final   Report Status PENDING   Incomplete  CULTURE, BLOOD (ROUTINE X 2)     Status: None   Collection Time    08/25/12  6:50 PM      Result Value Range Status   Specimen  Description BLOOD LEFT HAND   Final   Special Requests BOTTLES DRAWN AEROBIC AND ANAEROBIC 8CC EACH   Final   Culture NO GROWTH 1 DAY   Final   Report Status PENDING   Incomplete    Medical History: Past Medical History  Diagnosis Date  . Coronary artery disease   . Hypertension   . Cancer   . Pneumonia   . CHF (congestive heart failure)   . High cholesterol   . Diabetes mellitus   . Hepatitis   . Renal disorder     kidney cancer  . LBBB (left bundle branch block)   . Paroxysmal atrial fibrillation   . Sleep apnea     Medications:  Scheduled:  . ipratropium  0.5 mg Nebulization Q4H   And  . albuterol  2.5 mg Nebulization Q4H  . aspirin EC  325 mg Oral Daily  . atenolol  50 mg Oral Daily  . diltiazem  120 mg Oral Daily  . isosorbide mononitrate  60 mg Oral Daily  . lisinopril  20 mg Oral Daily  . methadone  20 mg Oral BID  . pantoprazole  40 mg Oral Daily  .  PARoxetine  20 mg Oral Daily  . piperacillin-tazobactam (ZOSYN)  IV  3.375 g Intravenous Q8H  . vancomycin  1,000 mg Intravenous Q48H  . [COMPLETED] warfarin  5 mg Oral ONCE-1800  . warfarin  5 mg Oral ONCE-1800  . [START ON 08/27/2012] Warfarin - Pharmacist Dosing Inpatient   Does not apply q1800  . [DISCONTINUED] albuterol  2.5 mg Nebulization Q4H  . [DISCONTINUED] albuterol      . [DISCONTINUED] aspirin EC  81 mg Oral Daily  . [DISCONTINUED] cefTRIAXone (ROCEPHIN)  IV  1 g Intravenous QHS  . [DISCONTINUED] enoxaparin (LOVENOX) injection  1 mg/kg Subcutaneous Q12H  . [DISCONTINUED] ipratropium-albuterol  3 mL Nebulization Q4H  . [DISCONTINUED] sulfamethoxazole-trimethoprim  1 tablet Oral Q12H  . [DISCONTINUED] sulfamethoxazole-trimethoprim  1 tablet Oral Q12H  . [DISCONTINUED] Warfarin - Pharmacist Dosing Inpatient   Does not apply q1800   Assessment: [name] who has been hospitalized since 3/14 for Afib.  He declined yesterday prior to discharge.  He was hypoxic this morning and noted to have infiltrate per  CXR.  He is starting broad spectrum antibiotics for HCAP/aspiration PNA. His renal function continues to worsen this admission.   Vanc 3/19>> Zosyn 3/19>>   Goal of Therapy:  Vancomycin trough level 15-20 mcg/ml  Plan:  1) Zosyn 3.375gm IV Q8h to be infused over 4hrs 2) Vancomycin 1gm IV Q48h 3) Check Vancomycin trough at steady state 4) Monitor renal function and cx data   [name] 08/26/2012,10:52 AM

## 2012-08-26 NOTE — Progress Notes (Signed)
Patient was unable to urinate for the shift, attempted bladder scan but was unable to get an accurate reading.  I called Dr. Sudie Bailey and was given an order to in and out cath and obtain stat blood gas. Patient remains lethargic and diaphoretic.  Alfonse Flavors F

## 2012-08-26 NOTE — Consult Note (Signed)
Consult requested by: Dr. Delbert Harness Consult requested for respiratory failure:  HPI: This is a 72 year old who was admitted to the hospital with atrial fibrillation with rapid ventricular response. He in improved was placed on amiodarone had cardiology consultation and actually is being prepared for discharge yesterday when he developed altered sensorium. He was being worked up for that was placed on BiPAP and apparently vomited and the night. This morning he is in respiratory distress hypoxic and was brought to the intensive care unit. He was noted to have what appears to be an infiltrate now. He has a history of atrial fibrillation as mentioned coronary artery occlusive disease CHF diabetes and now appears to have acute renal failure.  Past Medical History  Diagnosis Date  . Coronary artery disease   . Hypertension   . Cancer   . Pneumonia   . CHF (congestive heart failure)   . High cholesterol   . Diabetes mellitus   . Hepatitis   . Renal disorder     kidney cancer  . LBBB (left bundle branch block)   . Paroxysmal atrial fibrillation   . Sleep apnea      Family History  Problem Relation Age of Onset  . Hyperlipidemia Mother   . Heart attack Father   . Diabetes Mother      History   Social History  . Marital Status: Legally Separated    Spouse Name: N/A    Number of Children: N/A  . Years of Education: N/A   Social History Main Topics  . Smoking status: Former Games developer  . Smokeless tobacco: None  . Alcohol Use: No  . Drug Use: No  . Sexually Active: Not Currently   Other Topics Concern  . None   Social History Narrative  . None     ROS: Unobtainable    Objective: Vital signs in last 24 hours: Temp:  [97.7 F (36.5 C)-99 F (37.2 C)] 99 F (37.2 C) (03/19 0758) Pulse Rate:  [73-90] 82 (03/19 0758) Resp:  [12-20] 16 (03/19 0758) BP: (99-205)/(47-113) 159/113 mmHg (03/19 0758) SpO2:  [86 %-99 %] 97 % (03/19 0758) FiO2 (%):  [100 %] 100 % (03/19  0346) Weight change:  Last BM Date: 08/21/12  Intake/Output from previous day: 03/18 0701 - 03/19 0700 In: 1410 [P.O.:710; I.V.:700] Out: 1000 [Urine:1000]  PHYSICAL EXAM He is on a nonrebreather mask. He looks around but really does not make much meaningful response. His pupils do react. His mucous membranes are moist. His neck is supple. His chest shows some rhonchi bilaterally. Heart sounds are distant but his heart seems regular and I do not hear a gallop. His abdomen is soft obese without masses. He does not have any edema of the extremities. Central nervous system exam shows he is confused awake but not really responsive to questions or commands  Lab Results: Basic Metabolic Panel:  Recent Labs  16/10/96 0459 08/26/12 0450  NA 136 130*  K 4.4 5.0  CL 99 95*  CO2 25 24  GLUCOSE 96 137*  BUN 37* 54*  CREATININE 2.40* 3.55*  CALCIUM 8.9 8.6   Liver Function Tests:  Recent Labs  08/24/12 0459  AST 10  ALT 8  ALKPHOS 72  BILITOT 0.2*  PROT 7.4  ALBUMIN 3.6   No results found for this basename: LIPASE, AMYLASE,  in the last 72 hours No results found for this basename: AMMONIA,  in the last 72 hours CBC:  Recent Labs  08/25/12 1845 08/26/12  0450  WBC 6.4 5.3  NEUTROABS 4.2  --   HGB 9.8* 10.9*  HCT 30.5* 34.0*  MCV 75.9* 76.2*  PLT 261 260   Cardiac Enzymes: No results found for this basename: CKTOTAL, CKMB, CKMBINDEX, TROPONINI,  in the last 72 hours BNP: No results found for this basename: PROBNP,  in the last 72 hours D-Dimer: No results found for this basename: DDIMER,  in the last 72 hours CBG:  Recent Labs  08/25/12 0808 08/25/12 1132 08/25/12 1709 08/25/12 2031 08/25/12 2355 08/26/12 0743  GLUCAP 125* 122* 104* 119* 129* 170*   Hemoglobin A1C: No results found for this basename: HGBA1C,  in the last 72 hours Fasting Lipid Panel: No results found for this basename: CHOL, HDL, LDLCALC, TRIG, CHOLHDL, LDLDIRECT,  in the last 72  hours Thyroid Function Tests: No results found for this basename: TSH, T4TOTAL, FREET4, T3FREE, THYROIDAB,  in the last 72 hours Anemia Panel:  Recent Labs  08/26/12 0450  RETICCTPCT 1.0   Coagulation:  Recent Labs  08/25/12 0437 08/26/12 0450  LABPROT 20.6* 22.4*  INR 1.84* 2.06*   Urine Drug Screen: Drugs of Abuse  No results found for this basename: labopia, cocainscrnur, labbenz, amphetmu, thcu, labbarb    Alcohol Level: No results found for this basename: ETH,  in the last 72 hours Urinalysis:  Recent Labs  08/25/12 1610  COLORURINE YELLOW  LABSPEC >1.030*  PHURINE 5.5  GLUCOSEU NEGATIVE  HGBUR TRACE*  BILIRUBINUR SMALL*  KETONESUR NEGATIVE  PROTEINUR TRACE*  UROBILINOGEN 1.0  NITRITE NEGATIVE  LEUKOCYTESUR NEGATIVE   Misc. Labs:   ABGS:  Recent Labs  08/26/12 0615  PHART 7.149*  PO2ART 180.0*  TCO2 22.3  HCO3 23.4     MICROBIOLOGY: Recent Results (from the past 240 hour(s))  MRSA PCR SCREENING     Status: None   Collection Time    08/21/12  6:45 PM      Result Value Range Status   MRSA by PCR NEGATIVE  NEGATIVE Final   Comment:            The GeneXpert MRSA Assay (FDA     approved for NASAL specimens     only), is one component of a     comprehensive MRSA colonization     surveillance program. It is not     intended to diagnose MRSA     infection nor to guide or     monitor treatment for     MRSA infections.    Studies/Results: Dg Chest 1 View  08/26/2012  *RADIOLOGY REPORT*  Clinical Data: Aspiration.  Patient undergoing CPAP.  CHEST - 1 VIEW  Comparison: Portable chest x-ray 08/21/2012, 06/11/2011.  Two-view chest x-ray 02/08/2011.  Findings: Suboptimal inspiration accounts for crowded bronchovascular markings, especially in the lung bases, and accentuates the cardiac silhouette.  Taking this into account, cardiac silhouette moderately enlarged but stable.  Interval development of airspace consolidation in the right upper lobe and  pulmonary venous hypertension since the examination 5 days ago.  No overt pulmonary edema.  IMPRESSION: Right upper lobe pneumonia.  Pulmonary venous hypertension without overt edema.  Stable cardiomegaly.   Original Report Authenticated By: Hulan Saas, M.D.    Ct Head Wo Contrast  08/25/2012  *RADIOLOGY REPORT*  Clinical Data: Evaluate for bleed or infarct.  On Coumadin. Altered mental status.  History high blood pressure.  CT HEAD WITHOUT CONTRAST  Technique:  Contiguous axial images were obtained from the base of the skull through the vertex  without contrast.  Comparison: 04/26/2007.  Findings: No intracranial hemorrhage.  Remote infarct right lenticular nucleus.  Small vessel disease type changes without CT evidence of large acute infarct.  Global atrophy without hydrocephalus.  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  No other intracranial mass lesion noted on this unenhanced exam.  Vascular calcifications.  Mastoid air cells, middle ear cavities and visualized paranasal sinuses are clear.  Mild exophthalmos.  IMPRESSION: No intracranial hemorrhage or CT evidence of large acute infarct. Please see above.   Original Report Authenticated By: Lacy Duverney, M.D.     Medications:  Scheduled: . albuterol  2.5 mg Nebulization Q4H  . ipratropium  0.5 mg Nebulization Q4H   And  . albuterol  2.5 mg Nebulization Q4H  . albuterol      . aspirin EC  325 mg Oral Daily  . atenolol  50 mg Oral Daily  . cefTRIAXone (ROCEPHIN)  IV  1 g Intravenous QHS  . diltiazem  120 mg Oral Daily  . enoxaparin (LOVENOX) injection  1 mg/kg Subcutaneous Q12H  . isosorbide mononitrate  60 mg Oral Daily  . lisinopril  20 mg Oral Daily  . methadone  20 mg Oral BID  . pantoprazole  40 mg Oral Daily  . PARoxetine  20 mg Oral Daily  . piperacillin-tazobactam (ZOSYN)  IV  3.375 g Intravenous Q8H   Continuous: . dextrose 50 mL/hr at 08/25/12 1353   ZOX:[id number], nitroGLYCERIN  Assesment: He has  acute respiratory failure. His pH was 7.19. He's going to be placed back on BiPAP. He has pneumonia which may be aspiration but clearly is healthcare associated. He has worsening renal failure Principal Problem:   CAD (coronary artery disease), native coronary artery Active Problems:   Atrial fibrillation with rapid ventricular response   IDDM (insulin dependent diabetes mellitus)   Hyperlipidemia LDL goal < 70   Hypertension associated with diabetes   Degenerative disc disease, lumbar   Illiterate    Plan: Use BiPAP for now. Blood gas later in the day. Antibiotics for healthcare associated pneumonia. Depending on his response to treatment he may need intubation and mechanical ventilation. Nephrology consultation has been requested    LOS: 5 days   Darleny Sem L 08/26/2012, 8:09 AM

## 2012-08-26 NOTE — Progress Notes (Signed)
I was given critical results from the blood gas and called Dr. Sudie Bailey back with them. He gave orders to transfer to ICU. I called report to Nadine Counts, Charity fundraiser. Alfonse Flavors F

## 2012-08-26 NOTE — Progress Notes (Signed)
Mr Rittenhouse was found sitting up in the bed with vomit in his CPAP. He was having a hard time breathing. We checked his blood sugar it was WNL. His 02 sat was in the low 80s with a non re breather on. I tried younker suction and it did not increase the oxygen saturation.  Rapid response was called. I called Dr. Sudie Bailey and he ordered a chest xray and antibiotics. He also ordered breathing treatments and suctioning by respiratory. The patient is currently stable and on continuous pulse ox  and non re breather mask with oxygen saturation 100%. He remains lethargic. I will continue to monitor closely. Delma Freeze 08/26/2012

## 2012-08-26 NOTE — Consult Note (Addendum)
[name] MRN: [id number] DOB/AGE: Jul 24, 1940 72 y.o. Primary Care Physician:DONDIEGO,RICHARD M, MD Admit date: 08/21/2012 Chief Complaint:  Chief Complaint  Patient presents with  . Tachycardia   HPI:  Pt is 72 year male with past medical History of CAD, DM who presented to ER on 08/21/12 with c/o Tachycardia. At the time of presentation pt said  He  felt his heart was racing  And he had  diaphoresis and difficulty breathing. Pt also c/o  Feeling  weak and having chest pressure, feeling as if his heart were about to burst out of his chest.  Pt was found to have AFIB and was admitted for further care. Pt was started on Lovenox and later coumadin. Pt later beacme confused and Ct scan was obatined. Pt bladder was cathed and 1000 ml urine was drained. Pt now on BiPAP. Pt does not offer any complaints. Pt says OK to all questions.     Past Medical History  Diagnosis Date  . Coronary artery disease   . Hypertension   . Cancer   . Pneumonia   . CHF (congestive heart failure)   . High cholesterol   . Diabetes mellitus   . Hepatitis   . Renal disorder     kidney cancer  . LBBB (left bundle branch block)   . Paroxysmal atrial fibrillation   . Sleep apnea         Family History  Problem Relation Age of Onset  . Hyperlipidemia Mother   . Heart attack Father   . Diabetes Mother     Social History:  reports that he has quit smoking. He does not have any smokeless tobacco history on file. He reports that he does not drink alcohol or use illicit drugs.   Allergies: No Known Allergies  Medications Prior to Admission  Medication Sig Dispense Refill  . atenolol (TENORMIN) 50 MG tablet Take 50 mg by mouth daily.        Marland Kitchen glipiZIDE (GLUCOTROL) 5 MG tablet Take 1 tablet (5 mg total) by mouth daily before breakfast.  30 tablet  5  . hydrocodone-acetaminophen (LORCET-HD) 5-500 MG per capsule Take 1 capsule by mouth every 6 (six) hours as needed for pain.  12 capsule  0  .  insulin glargine (LANTUS) 100 UNIT/ML injection Inject 40 Units into the skin at bedtime.       . isosorbide mononitrate (IMDUR) 60 MG 24 hr tablet Take 60 mg by mouth daily.        Marland Kitchen lisinopril (PRINIVIL,ZESTRIL) 40 MG tablet Take 0.5 tablets (20 mg total) by mouth daily.  30 tablet  2  . metFORMIN (GLUCOPHAGE) 500 MG tablet Take 500 mg by mouth daily with breakfast.      . methadone (DOLOPHINE) 10 MG tablet Take 20 mg by mouth 2 (two) times daily.        . nitroGLYCERIN (NITROSTAT) 0.4 MG SL tablet Place 0.4 mg under the tongue every 5 (five) minutes as needed. Chest pain       . pantoprazole (PROTONIX) 40 MG tablet Take 40 mg by mouth daily.        Marland Kitchen PARoxetine (PAXIL) 10 MG tablet Take 2 tablets (20 mg total) by mouth daily.  30 tablet  3  . [DISCONTINUED] aspirin EC 81 MG tablet Take 81 mg by mouth daily.        . [DISCONTINUED] clopidogrel (PLAVIX) 75 MG tablet Take 75 mg by mouth daily.        . [  DISCONTINUED] nitroGLYCERIN (NITRODUR - DOSED IN MG/24 HR) 0.4 mg/hr Place 1 patch onto the skin daily.             ZOX:WRUEA from the symptoms mentioned above,there are no other symptoms referable to all systems reviewed.  Marland Kitchen ipratropium  0.5 mg Nebulization Q4H   And  . albuterol  2.5 mg Nebulization Q4H  . aspirin EC  325 mg Oral Daily  . atenolol  50 mg Oral Daily  . diltiazem  120 mg Oral Daily  . enoxaparin (LOVENOX) injection  1 mg/kg Subcutaneous Q12H  . isosorbide mononitrate  60 mg Oral Daily  . lisinopril  20 mg Oral Daily  . methadone  20 mg Oral BID  . pantoprazole  40 mg Oral Daily  . PARoxetine  20 mg Oral Daily  . piperacillin-tazobactam (ZOSYN)  IV  3.375 g Intravenous Q8H       Physical Exam: Vital signs in last 24 hours: Temp:  [97.7 F (36.5 C)-99 F (37.2 C)] 99 F (37.2 C) (03/19 0758) Pulse Rate:  [73-90] 88 (03/19 1026) Resp:  [12-20] 20 (03/19 1026) BP: (110-205)/(63-113) 159/113 mmHg (03/19 0758) SpO2:  [86 %-99 %] 97 % (03/19 1026) FiO2 (%):  [35  %-100 %] 35 % (03/19 1026) Weight change:  Last BM Date: 08/21/12  Intake/Output from previous day: 03/18 0701 - 03/19 0700 In: 1410 [P.O.:710; I.V.:700] Out: 1000 [Urine:1000]     Physical Exam: General- pt is awake,responds intermittently Resp- No acute REsp distress, Bipap insitu, decreased BS at bases. CVS- S1S2 regular ij rate and rhythm GIT- BS+, soft, NT, ND, Obese EXT- NO LE Edema, Cyanosis    Lab Results: CBC  Recent Labs  08/25/12 1845 08/26/12 0450  WBC 6.4 5.3  HGB 9.8* 10.9*  HCT 30.5* 34.0*  PLT 261 260    BMET  Recent Labs  08/24/12 0459 08/26/12 0450  NA 136 130*  K 4.4 5.0  CL 99 95*  CO2 25 24  GLUCOSE 96 137*  BUN 37* 54*  CREATININE 2.40* 3.55*  CALCIUM 8.9 8.6   Creat trend 2014  1.18==>1.52==>1.76==>2.40=>3.55 2013  1.3--1.7 2012  1.35--1.8 2011  1.75 2010  1.67 2009   1.1--2.3 2008  0.9--1.27      MICRO Recent Results (from the past 240 hour(s))  MRSA PCR SCREENING     Status: None   Collection Time    08/21/12  6:45 PM      Result Value Range Status   MRSA by PCR NEGATIVE  NEGATIVE Final   Comment:            The GeneXpert MRSA Assay (FDA     approved for NASAL specimens     only), is one component of a     comprehensive MRSA colonization     surveillance program. It is not     intended to diagnose MRSA     infection nor to guide or     monitor treatment for     MRSA infections.  CULTURE, BLOOD (ROUTINE X 2)     Status: None   Collection Time    08/25/12  6:45 PM      Result Value Range Status   Specimen Description BLOOD LEFT ANTECUBITAL   Final   Special Requests BOTTLES DRAWN AEROBIC AND ANAEROBIC 8CC EACH   Final   Culture NO GROWTH 1 DAY   Final   Report Status PENDING   Incomplete  CULTURE, BLOOD (ROUTINE X 2)  Status: None   Collection Time    08/25/12  6:50 PM      Result Value Range Status   Specimen Description BLOOD LEFT HAND   Final   Special Requests BOTTLES DRAWN AEROBIC AND ANAEROBIC  8CC EACH   Final   Culture NO GROWTH 1 DAY   Final   Report Status PENDING   Incomplete      Lab Results  Component Value Date   CALCIUM 8.6 08/26/2012    Results for [name], [name] (MRN [id number]) as of 08/26/2012 16:33  Ref. Range 08/26/2012 09:50  pH, Arterial Latest Range: 7.350-7.450  7.230 (L)  pCO2 arterial Latest Range: 35.0-45.0 mmHg 54.9 (H)  pO2, Arterial Latest Range: 80.0-100.0 mmHg 90.4  Bicarbonate Latest Range: 20.0-24.0 mEq/L 22.1  TCO2 Latest Range: 0-100 mmol/L 21.3  Acid-base deficit Latest Range: 0.0-2.0 mmol/L 4.3 (H)  O2 Saturation No range found 96.9  Patient temperature No range found 37.0    Impression: 1)Renal  AKI secondary to Preernal/ ATN/ POst renal     Reasons for Prerenal/ ATN- Hypotension + ACE + metformin     Reason for POst renal- 1000 ml output after Cath     AKI on CKD    CKD stage 3 .   CKD since 2009  CKD secondary to HTN/ DM   Progression of CKD marked with multiple AKI   Proteinura will check.   2)HTN Target Organ damage  CKD CAD CVA PVD Medication- On RAS blockers- ACEOn Diuretics-Lasix/ Hctz On Calcium Channel Blockers On Beta blockers On Vasodilators-  3)Anemia HGb at goal (9--11) GI work up-as per [employer] team  4)CKD Mineral-Bone Disorder PTH not avail  Secondary Hyperparathyroidism w/u pending Phosphorus will check Vitamin 25-OH will check  5)Afib- Primary team and  Cardiology following   6)FEN  Normokalemic NOrmonatremic Euvolemic   7)Acid base Resp Acidosis  Co2 not at goal ON Bipap    Plan:  Will ask for renal U/s Will ask for FENA Will ask for Proteinuria work up Will d/c ace Will ask for CKD-BMD work up Will ask for hepatitis work up Will start IVF Will star lasix to help with urine output Will ask for 2d echo to see IVC and help w Fluid assessment   Addendum Renal U/s results came back Right Kidney: Removed.  Left Kidney: 14.0 cm in length. 1 cm cyst on the lateral aspect  of the  lower pole, unchanged. No hydronephrosis or renal cortical  thinning.  Bladder: The bladder is empty with a Foley catheter in place.  IMPRESSION:  Normal appearing left kidney. Stable tiny cyst in the lower pole.  CKD stage 3 . 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   Twanisha Foulk S 08/26/2012, 10:38 AM

## 2012-08-26 NOTE — Discharge Summary (Signed)
NAMEALLYN, [name]                ACCOUNT NO.:  [id number]  MEDICAL RECORD NO.:  [id number]  LOCATION:  A308                          FACILITY:  [facility]  PHYSICIAN:  Melvyn Novas, MDDATE OF BIRTH:  Nov 15, 1940  DATE OF ADMISSION:  08/21/2012 DATE OF DISCHARGE:  LH                              DISCHARGE SUMMARY   HISTORY:  The patient is a 72 year old, white male, who is totally illiterate who has some difficulty with medicines, who has history of several stents in 2 different vessels on separate occasions, known anatomy, history of paroxysmal atrial fibrillation, which manifest on this admission, insulin-dependent diabetes, hypertension, hyperlipidemia, and coronary artery disease.  The patient was admitted with AFib, placed on IV amiodarone, converted to sinus rhythm, stayed in sinus rhythm, seen in consultation by Cardiology.  A 2-D echocardiogram reveals moderately enlarged left atrium, normal systolic function of left ventricle, some mild diastolic dysfunction.  Troponins were negative serially and he had no evidence of ischemic heart disease.  He had another AV nodal blocking agent added and continued to do well.  He was hemodynamically stable throughout his hospital stay and was placed on Coumadin.  His INR was mildly subtherapeutic, but was decided to discharge him with home health care follow up as the patient will require follow up for PT/INRs.  He is supposed to follow up in my office in 2 days time to check PT/INR and then again on Monday.  The patient understands this.  His diabetes was in good control.  He had mild renal failure.  Creatinine 1.76.  It was elected to continue metformin as this helps his glycemic control at this point.  Monitor renal function in the future.  DISCHARGE MEDICINES: 1. Cardizem CD 120 p.o. daily. 2. Warfarin 5 mg per day. 3. Atenolol 50 mg p.o. daily. 4. Glucotrol 5 mg p.o. daily. 5. Hydrocodone 5 mg p.o. q.i.d. p.r.n. 6. Lantus  insulin 45 units at bedtime. 7. Imdur 60 mg p.o. daily. 8. Zestril 40 mg p.o. daily. 9. Glucophage 500 mg p.o. b.i.d. 10.Methadone 10 mg p.o. t.i.d. p.r.n. 11.Protonix 40 mg p.o. daily. 12.Paxil 10 mg p.o. daily. 13.Sublingual nitroglycerin 0.4 mg p.r.n.  The patient will follow up in the office in 2 days time.     Melvyn Novas, MD     RMD/MEDQ  D:  08/25/2012  T:  08/26/2012  Job:  [id number]

## 2012-08-27 ENCOUNTER — Inpatient Hospital Stay (HOSPITAL_COMMUNITY): Payer: Medicare Other

## 2012-08-27 LAB — GLUCOSE, CAPILLARY
Glucose-Capillary: 119 mg/dL — ABNORMAL HIGH (ref 70–99)
Glucose-Capillary: 131 mg/dL — ABNORMAL HIGH (ref 70–99)
Glucose-Capillary: 136 mg/dL — ABNORMAL HIGH (ref 70–99)

## 2012-08-27 LAB — BLOOD GAS, ARTERIAL
Acid-base deficit: 5.3 mmol/L — ABNORMAL HIGH (ref 0.0–2.0)
Delivery systems: POSITIVE
Drawn by: 22223
Inspiratory PAP: 16
pCO2 arterial: 52.1 mmHg — ABNORMAL HIGH (ref 35.0–45.0)

## 2012-08-27 LAB — BASIC METABOLIC PANEL
Calcium: 7 mg/dL — ABNORMAL LOW (ref 8.4–10.5)
Creatinine, Ser: 3.82 mg/dL — ABNORMAL HIGH (ref 0.50–1.35)
GFR calc Af Amer: 17 mL/min — ABNORMAL LOW (ref >90)
GFR calc non Af Amer: 15 mL/min — ABNORMAL LOW (ref >90)

## 2012-08-27 LAB — HEPATITIS PANEL, ACUTE
HCV Ab: NEGATIVE
Hep A IgM: NEGATIVE
Hep B C IgM: NEGATIVE
Hepatitis B Surface Ag: NEGATIVE

## 2012-08-27 LAB — CBC
HCT: 27.7 % — ABNORMAL LOW (ref 39.0–52.0)
Hemoglobin: 9 g/dL — ABNORMAL LOW (ref 13.0–17.0)
MCH: 24.5 pg — ABNORMAL LOW (ref 26.0–34.0)
MCHC: 32.5 g/dL (ref 30.0–36.0)
RDW: 16.2 % — ABNORMAL HIGH (ref 11.5–15.5)

## 2012-08-27 LAB — HEPATIC FUNCTION PANEL
Alkaline Phosphatase: 47 U/L (ref 39–117)
Indirect Bilirubin: 0.1 mg/dL — ABNORMAL LOW (ref 0.3–0.9)
Total Protein: 5.2 g/dL — ABNORMAL LOW (ref 6.0–8.3)

## 2012-08-27 LAB — PROTIME-INR
INR: 2.84 — ABNORMAL HIGH (ref 0.00–1.49)
Prothrombin Time: 28.4 seconds — ABNORMAL HIGH (ref 11.6–15.2)

## 2012-08-27 LAB — PTH, INTACT AND CALCIUM
Calcium, Total (PTH): 8.1 mg/dL — ABNORMAL LOW (ref 8.4–10.5)
PTH: 108.5 pg/mL — ABNORMAL HIGH (ref 14.0–72.0)

## 2012-08-27 LAB — VITAMIN B12: Vitamin B-12: 238 pg/mL (ref 211–911)

## 2012-08-27 LAB — VITAMIN D 25 HYDROXY (VIT D DEFICIENCY, FRACTURES): Vit D, 25-Hydroxy: 25 ng/mL — ABNORMAL LOW (ref 30–89)

## 2012-08-27 MED ORDER — NITROGLYCERIN 2 % TD OINT
1.0000 [in_us] | TOPICAL_OINTMENT | Freq: Every day | TRANSDERMAL | Status: DC
  Administered 2012-08-28 – 2012-09-07 (×10): 1 [in_us] via TOPICAL
  Filled 2012-08-27 (×10): qty 1

## 2012-08-27 MED ORDER — FUROSEMIDE 10 MG/ML IJ SOLN
100.0000 mg | Freq: Two times a day (BID) | INTRAVENOUS | Status: DC
  Administered 2012-08-27 – 2012-08-28 (×4): 100 mg via INTRAVENOUS
  Filled 2012-08-27 (×10): qty 10

## 2012-08-27 MED ORDER — METOPROLOL TARTRATE 1 MG/ML IV SOLN
5.0000 mg | Freq: Four times a day (QID) | INTRAVENOUS | Status: DC | PRN
  Administered 2012-08-28 – 2012-08-31 (×6): 5 mg via INTRAVENOUS
  Filled 2012-08-27 (×6): qty 5

## 2012-08-27 MED ORDER — NITROGLYCERIN 2 % TD OINT: 1.0000 [in_us] | TOPICAL_OINTMENT | TRANSDERMAL | Status: DC

## 2012-08-27 MED ORDER — PANTOPRAZOLE SODIUM 40 MG IV SOLR
40.0000 mg | INTRAVENOUS | Status: DC
  Administered 2012-08-27 – 2012-09-01 (×6): 40 mg via INTRAVENOUS
  Filled 2012-08-27 (×7): qty 40

## 2012-08-27 MED ORDER — WARFARIN SODIUM 1 MG PO TABS: 1.0000 mg | ORAL_TABLET | Freq: Once | ORAL | Status: AC

## 2012-08-27 NOTE — Progress Notes (Signed)
Pt became very agitated and upset and began pulling at foley catheter tubing and trying to climb out of bed. Safety mitts were applied but pt still was able to pull at lines and continued to try to leave his bed.

## 2012-08-27 NOTE — Progress Notes (Signed)
Subjective: He continues to be confused. He is poorly responsive.  Objective: Vital signs in last 24 hours: Temp:  [98.9 F (37.2 C)-99.5 F (37.5 C)] 99.2 F (37.3 C) (03/20 0752) Pulse Rate:  [38-88] 87 (03/20 0800) Resp:  [10-23] 20 (03/20 0800) BP: (90-115)/(43-85) 114/53 mmHg (03/20 0800) SpO2:  [87 %-100 %] 93 % (03/20 0800) FiO2 (%):  [25 %-35 %] 25 % (03/20 0400) Weight:  [114.9 kg (253 lb 4.9 oz)] 114.9 kg (253 lb 4.9 oz) (03/20 0500) Weight change:  Last BM Date: 08/23/12  Intake/Output from previous day: 03/19 0701 - 03/20 0700 In: 2404.2 [I.V.:2104.2; IV Piggyback:300] Out: 505 [Urine:505]  PHYSICAL EXAM General appearance: moderate distress Resp: rhonchi bilaterally Cardio: regular rate and rhythm, S1, S2 normal, no murmur, click, rub or gallop GI: soft, non-tender; bowel sounds normal; no masses,  no organomegaly Extremities: extremities normal, atraumatic, no cyanosis or edema  Lab Results:    Basic Metabolic Panel:  Recent Labs  45/40/98 0450 08/26/12 1312 08/27/12 0505  NA 130*  --  135  K 5.0  --  3.9  CL 95*  --  105  CO2 24  --  18*  GLUCOSE 137*  --  107*  BUN 54*  --  57*  CREATININE 3.55*  --  3.82*  CALCIUM 8.6  --  7.0*  PHOS  --  5.6*  --    Liver Function Tests:  Recent Labs  08/27/12 0505  AST 8  ALT 8  ALKPHOS 47  BILITOT 0.3  PROT 5.2*  ALBUMIN 2.2*   No results found for this basename: LIPASE, AMYLASE,  in the last 72 hours No results found for this basename: AMMONIA,  in the last 72 hours CBC:  Recent Labs  08/25/12 1845 08/26/12 0450  WBC 6.4 5.3  NEUTROABS 4.2  --   HGB 9.8* 10.9*  HCT 30.5* 34.0*  MCV 75.9* 76.2*  PLT 261 260   Cardiac Enzymes: No results found for this basename: CKTOTAL, CKMB, CKMBINDEX, TROPONINI,  in the last 72 hours BNP: No results found for this basename: PROBNP,  in the last 72 hours D-Dimer: No results found for this basename: DDIMER,  in the last 72 hours CBG:  Recent  Labs  08/26/12 0743 08/26/12 1252 08/26/12 1646 08/26/12 2054 08/27/12 0013 08/27/12 0727  GLUCAP 170* 148* 135* 133* 120* 118*   Hemoglobin A1C: No results found for this basename: HGBA1C,  in the last 72 hours Fasting Lipid Panel: No results found for this basename: CHOL, HDL, LDLCALC, TRIG, CHOLHDL, LDLDIRECT,  in the last 72 hours Thyroid Function Tests: No results found for this basename: TSH, T4TOTAL, FREET4, T3FREE, THYROIDAB,  in the last 72 hours Anemia Panel:  Recent Labs  08/26/12 0450  RETICCTPCT 1.0   Coagulation:  Recent Labs  08/26/12 0450 08/27/12 0505  LABPROT 22.4* 28.4*  INR 2.06* 2.84*   Urine Drug Screen: Drugs of Abuse  No results found for this basename: labopia, cocainscrnur, labbenz, amphetmu, thcu, labbarb    Alcohol Level: No results found for this basename: ETH,  in the last 72 hours Urinalysis:  Recent Labs  08/25/12 1610 08/26/12 0727  COLORURINE YELLOW RED*  LABSPEC >1.030* 1.025  PHURINE 5.5 7.0  GLUCOSEU NEGATIVE 100*  HGBUR TRACE* LARGE*  BILIRUBINUR SMALL* LARGE*  KETONESUR NEGATIVE 15*  PROTEINUR TRACE* >300*  UROBILINOGEN 1.0 >8.0*  NITRITE NEGATIVE POSITIVE*  LEUKOCYTESUR NEGATIVE MODERATE*   Misc. Labs:  ABGS  Recent Labs  08/27/12 0350  PHART  7.230*  PO2ART 68.5*  TCO2 20.7  HCO3 21.1   CULTURES Recent Results (from the past 240 hour(s))  MRSA PCR SCREENING     Status: None   Collection Time    08/21/12  6:45 PM      Result Value Range Status   MRSA by PCR NEGATIVE  NEGATIVE Final   Comment:            The GeneXpert MRSA Assay (FDA     approved for NASAL specimens     only), is one component of a     comprehensive MRSA colonization     surveillance program. It is not     intended to diagnose MRSA     infection nor to guide or     monitor treatment for     MRSA infections.  URINE CULTURE     Status: None   Collection Time    08/25/12  4:12 PM      Result Value Range Status   Specimen  Description URINE, CATHETERIZED   Final   Special Requests NONE   Final   Culture  Setup Time 08/26/2012 03:22   Final   Colony Count NO GROWTH   Final   Culture NO GROWTH   Final   Report Status 08/26/2012 FINAL   Final  CULTURE, BLOOD (ROUTINE X 2)     Status: None   Collection Time    08/25/12  6:45 PM      Result Value Range Status   Specimen Description BLOOD LEFT ANTECUBITAL   Final   Special Requests BOTTLES DRAWN AEROBIC AND ANAEROBIC 8CC EACH   Final   Culture NO GROWTH 1 DAY   Final   Report Status PENDING   Incomplete  CULTURE, BLOOD (ROUTINE X 2)     Status: None   Collection Time    08/25/12  6:50 PM      Result Value Range Status   Specimen Description BLOOD LEFT HAND   Final   Special Requests BOTTLES DRAWN AEROBIC AND ANAEROBIC 8CC EACH   Final   Culture NO GROWTH 1 DAY   Final   Report Status PENDING   Incomplete   Studies/Results: Dg Chest 1 View  08/26/2012  *RADIOLOGY REPORT*  Clinical Data: Aspiration.  Patient undergoing CPAP.  CHEST - 1 VIEW  Comparison: Portable chest x-ray 08/21/2012, 06/11/2011.  Two-view chest x-ray 02/08/2011.  Findings: Suboptimal inspiration accounts for crowded bronchovascular markings, especially in the lung bases, and accentuates the cardiac silhouette.  Taking this into account, cardiac silhouette moderately enlarged but stable.  Interval development of airspace consolidation in the right upper lobe and pulmonary venous hypertension since the examination 5 days ago.  No overt pulmonary edema.  IMPRESSION: Right upper lobe pneumonia.  Pulmonary venous hypertension without overt edema.  Stable cardiomegaly.   Original Report Authenticated By: Hulan Saas, M.D.    Ct Head Wo Contrast  08/25/2012  *RADIOLOGY REPORT*  Clinical Data: Evaluate for bleed or infarct.  On Coumadin. Altered mental status.  History high blood pressure.  CT HEAD WITHOUT CONTRAST  Technique:  Contiguous axial images were obtained from the base of the skull through  the vertex without contrast.  Comparison: 04/26/2007.  Findings: No intracranial hemorrhage.  Remote infarct right lenticular nucleus.  Small vessel disease type changes without CT evidence of large acute infarct.  Global atrophy without hydrocephalus.  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  No other intracranial mass lesion noted on this unenhanced exam.  Vascular calcifications.  Mastoid air cells, middle ear cavities and visualized paranasal sinuses are clear.  Mild exophthalmos.  IMPRESSION: No intracranial hemorrhage or CT evidence of large acute infarct. Please see above.   Original Report Authenticated By: Lacy Duverney, M.D.    US Renal  08/26/2012  *RADIOLOGY REPORT*  Clinical Data: Renal insufficiency. 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  RENAL/URINARY TRACT ULTRASOUND COMPLETE  Comparison:  CT scan dated 03/09/2012  Findings:  Right Kidney:  Removed.  Left Kidney:  14.0 cm in length.  1 cm cyst on the lateral aspect of the lower pole, unchanged.  No hydronephrosis or renal cortical thinning.  Bladder:  The bladder is empty with a Foley catheter in place.  IMPRESSION: Normal appearing left kidney.  Stable tiny cyst in the lower pole.   Original Report Authenticated By: Francene Boyers, M.D.    Dg Chest Port 1 View  08/27/2012  *RADIOLOGY REPORT*  Clinical Data: To torus failure  PORTABLE CHEST - 1 VIEW  Comparison: Portable chest x-ray of 08/26/2012  Findings: There is more patchy opacities bilaterally.  Although this could represent asymmetrical edema, pneumonia is a definite consideration.  Cardiomegaly is stable.  No bony abnormality is seen.  IMPRESSION: Some worsening of patchy airspace disease.  Possible asymmetrical edema but consider pneumonia.   Original Report Authenticated By: Dwyane Dee, M.D.     Medications:  Scheduled: . ipratropium  0.5 mg Nebulization Q4H   And  . albuterol  2.5 mg Nebulization Q4H  . aspirin EC  325 mg Oral Daily  . atenolol   50 mg Oral Daily  . diltiazem  120 mg Oral Daily  . furosemide  100 mg Intravenous BID  . isosorbide mononitrate  60 mg Oral Daily  . pantoprazole  40 mg Oral Daily  . PARoxetine  20 mg Oral Daily  . piperacillin-tazobactam (ZOSYN)  IV  3.375 g Intravenous Q8H  . vancomycin  1,000 mg Intravenous Q48H  . Warfarin - Pharmacist Dosing Inpatient   Does not apply q1800   Continuous: . sodium chloride 125 mL/hr at 08/27/12 0800  . dextrose 50 mL/hr at 08/25/12 1353   ZOX:WRUEAVWU, nitroGLYCERIN  Assesment: He has healthcare associated pneumonia. This is multicentric oral but could also be from edema. I do think he has pneumonia from aspiration and needs to be treated. He is going to be started on Lasix by nephrology consultant which I think is appropriate. He is still delirious but I think somewhat better Principal Problem:   CAD (coronary artery disease), native coronary artery Active Problems:   Atrial fibrillation with rapid ventricular response   IDDM (insulin dependent diabetes mellitus)   Hyperlipidemia LDL goal < 70   Hypertension associated with diabetes   Degenerative disc disease, lumbar   Illiterate    Plan: Continue current IV antibiotics. Add diuresis.    LOS: 6 days   Ceclia Koker L 08/27/2012, 8:54 AM

## 2012-08-27 NOTE — Progress Notes (Addendum)
ANTICOAGULATION CONSULT NOTE  Pharmacy Consult for Warfarin Indication: Afib  No Known Allergies  Patient Measurements: Height: 5' 7.5" (171.5 cm) Weight: 253 lb 4.9 oz (114.9 kg) IBW/kg (Calculated) : 67.25 Body mass index is 39.07 kg/(m^2).  Vital Signs: Temp: 99.2 F (37.3 C) (03/20 0752) Temp src: Axillary (03/20 0752) BP: 114/53 mmHg (03/20 0800) Pulse Rate: 87 (03/20 0800)  Labs:  Recent Labs  08/25/12 0437  08/25/12 1845 08/26/12 0450 08/27/12 0505 08/27/12 0902  HGB  --   < > 9.8* 10.9*  --  9.0*  HCT  --   --  30.5* 34.0*  --  27.7*  PLT  --   --  261 260  --  188  LABPROT 20.6*  --   --  22.4* 28.4*  --   INR 1.84*  --   --  2.06* 2.84*  --   CREATININE  --   --   --  3.55* 3.82*  --   < > = values in this interval not displayed. Estimated Creatinine Clearance: 22.3 ml/min (by C-G formula based on Cr of 3.82).  Medical History: Past Medical History  Diagnosis Date  . Coronary artery disease   . Hypertension   . Cancer   . Pneumonia   . CHF (congestive heart failure)   . High cholesterol   . Diabetes mellitus   . Hepatitis   . Renal disorder     kidney cancer  . LBBB (left bundle branch block)   . Paroxysmal atrial fibrillation   . Sleep apnea    Medications:  Scheduled:  . ipratropium  0.5 mg Nebulization Q4H   And  . albuterol  2.5 mg Nebulization Q4H  . aspirin EC  325 mg Oral Daily  . atenolol  50 mg Oral Daily  . diltiazem  120 mg Oral Daily  . furosemide  100 mg Intravenous BID  . isosorbide mononitrate  60 mg Oral Daily  . [COMPLETED] LORazepam  1 mg Intramuscular Once  . pantoprazole  40 mg Oral Daily  . PARoxetine  20 mg Oral Daily  . piperacillin-tazobactam (ZOSYN)  IV  3.375 g Intravenous Q8H  . vancomycin  1,000 mg Intravenous Q48H  . [COMPLETED] warfarin  5 mg Oral ONCE-1800   Or  . [COMPLETED] warfarin IV  5 mg Intravenous ONCE-1800  . Warfarin - Pharmacist Dosing Inpatient   Does not apply q1800  . [DISCONTINUED]  albuterol  2.5 mg Nebulization Q4H  . [DISCONTINUED] albuterol      . [DISCONTINUED] enoxaparin (LOVENOX) injection  1 mg/kg Subcutaneous Q12H  . [DISCONTINUED] furosemide  40 mg Intravenous BID  . [DISCONTINUED] lisinopril  20 mg Oral Daily  . [DISCONTINUED] methadone  20 mg Oral BID  . [DISCONTINUED] warfarin  5 mg Oral ONCE-1800   Assessment: [name] with new onset Afib started on warfarin.   His INR is therapeutic & continues to trend to upper end of goal range.  No bleeding noted.    Goal of Therapy:  INR 2-3   Plan:  PT/INR daily Warfarin 1 mg PO x 1 today Consider decrease aspirin to 81mg  since INR therapeutic on warfarin  [name], [facility] 08/27/2012,10:20 AM

## 2012-08-27 NOTE — Progress Notes (Signed)
UR Chart Review Completed  

## 2012-08-27 NOTE — Progress Notes (Signed)
[id number] 

## 2012-08-27 NOTE — Progress Notes (Signed)
INITIAL NUTRITION ASSESSMENT  DOCUMENTATION CODES Per approved criteria  -Obesity Class II   INTERVENTION: Recommend consider enteral nutrition if pt poor po's persist. Will follow for nutrition needs.  NUTRITION DIAGNOSIS: Inadequate oral intake related to  Pt poorly reponsisve as evidenced by MD assessment and no po intake since dinner on 08/25/12. .   Goal: Pt to meet >/= 90% of their estimated nutrition needs  Monitor:  Po intake, labs and wt trends  Reason for Assessment: Low Braden  72 y.o. male  Admitting Dx: CAD (coronary artery disease), native coronary artery  ASSESSMENT: Chart reviewed. Unable to arouse pt to discuss nutrition hx. He's on BiPAP currently. Po's per chart: 75% for dinner on Tuesday. Braden Score = 12 today which places him at high risk for skin breakdown. His po intake currently 0%. If inadequate po persists recommend consider alternate nutrition source.  Height: Ht Readings from Last 1 Encounters:  08/21/12 5' 7.5" (1.715 m)    Weight: Wt Readings from Last 1 Encounters:  08/27/12 253 lb 4.9 oz (114.9 kg)    Ideal Body Weight: 148# (67.2 kg)  % Ideal Body Weight: 170%  Wt Readings from Last 10 Encounters:  08/27/12 253 lb 4.9 oz (114.9 kg)  06/15/12 242 lb (109.77 kg)  03/14/12 242 lb (109.77 kg)  03/09/12 242 lb (109.77 kg)  12/21/11 240 lb (108.863 kg)  12/15/11 252 lb (114.306 kg)  06/14/11 244 lb 7.8 oz (110.9 kg)  06/09/11 259 lb 7.7 oz (117.7 kg)  02/08/11 252 lb (114.306 kg)    Usual Body Weight: 240-250#  % Usual Body Weight: 103%  BMI:  Body mass index is 39.07 kg/(m^2).Obesity Class II  Estimated Nutritional Needs: Kcal: 1474-1608 (22-25 kcal/kg IBW) Protein: 100-134 gr Fluid: > 2000 ml/day (normal needs)  Skin: No issues noted  Diet Order: NPO  EDUCATION NEEDS: -Education not appropriate at this time   Intake/Output Summary (Last 24 hours) at 08/27/12 0957 Last data filed at 08/27/12 0802  Gross per 24 hour   Intake 2404.17 ml  Output    505 ml  Net 1899.17 ml    Last BM: 08/23/12  Labs:   Recent Labs Lab 08/21/12 1506  08/24/12 0459 08/26/12 0450 08/26/12 1312 08/27/12 0505  NA  --   < > 136 130*  --  135  K  --   < > 4.4 5.0  --  3.9  CL  --   < > 99 95*  --  105  CO2  --   < > 25 24  --  18*  BUN  --   < > 37* 54*  --  57*  CREATININE  --   < > 2.40* 3.55*  --  3.82*  CALCIUM  --   < > 8.9 8.6  --  7.0*  MG 2.1  --   --   --   --   --   PHOS  --   --   --   --  5.6*  --   GLUCOSE  --   < > 96 137*  --  107*  < > = values in this interval not displayed.  CBG (last 3)   Recent Labs  08/26/12 2054 08/27/12 0013 08/27/12 0727  GLUCAP 133* 120* 118*    Scheduled Meds: . ipratropium  0.5 mg Nebulization Q4H   And  . albuterol  2.5 mg Nebulization Q4H  . aspirin EC  325 mg Oral Daily  . atenolol  50  mg Oral Daily  . diltiazem  120 mg Oral Daily  . furosemide  100 mg Intravenous BID  . isosorbide mononitrate  60 mg Oral Daily  . pantoprazole  40 mg Oral Daily  . PARoxetine  20 mg Oral Daily  . piperacillin-tazobactam (ZOSYN)  IV  3.375 g Intravenous Q8H  . vancomycin  1,000 mg Intravenous Q48H  . Warfarin - Pharmacist Dosing Inpatient   Does not apply q1800    Continuous Infusions: . sodium chloride 125 mL/hr at 08/27/12 0800  . dextrose 50 mL/hr at 08/25/12 1353    Past Medical History  Diagnosis Date  . Coronary artery disease   . Hypertension   . Cancer   . Pneumonia   . CHF (congestive heart failure)   . High cholesterol   . Diabetes mellitus   . Hepatitis   . Renal disorder     kidney cancer  . LBBB (left bundle branch block)   . Paroxysmal atrial fibrillation   . Sleep apnea     Past Surgical History  Procedure Laterality Date  . Back surgery    . Coronary angioplasty with stent placement    . Nephrectomy      Royann Shivers MS,RD,LDN,CSG Office: [phone] Pager: [phone]

## 2012-08-27 NOTE — Progress Notes (Signed)
Subjective: Interval History: none.  Objective: Vital signs in last 24 hours: Temp:  [98.9 F (37.2 C)-99.5 F (37.5 C)] 99.1 F (37.3 C) (03/20 0400) Pulse Rate:  [38-88] 87 (03/20 0600) Resp:  [7-23] 18 (03/20 0600) BP: (87-159)/(43-113) 108/56 mmHg (03/20 0600) SpO2:  [87 %-100 %] 97 % (03/20 0721) FiO2 (%):  [25 %-100 %] 25 % (03/20 0400) Weight:  [114.9 kg (253 lb 4.9 oz)] 114.9 kg (253 lb 4.9 oz) (03/20 0500) Weight change:   Intake/Output from previous day: 03/19 0701 - 03/20 0700 In: 2404.2 [I.V.:2104.2; IV Piggyback:300] Out: 505 [Urine:505] Intake/Output this shift:    Generally patient very somnolent and only responsive to  pain. He responded only open his eyes but no complication. Chest he has bilateral inspiratory rhonchi and also some expiratory wheezing. Heart exam revealed regular rate and rhythm no murmur Abdomen soft positive bowel sound Extremities possible trace edema.  Lab Results:  Recent Labs  08/25/12 1845 08/26/12 0450  WBC 6.4 5.3  HGB 9.8* 10.9*  HCT 30.5* 34.0*  PLT 261 260   BMET:  Recent Labs  08/26/12 0450 08/27/12 0505  NA 130* 135  K 5.0 3.9  CL 95* 105  CO2 24 18*  GLUCOSE 137* 107*  BUN 54* 57*  CREATININE 3.55* 3.82*  CALCIUM 8.6 7.0*   No results found for this basename: PTH,  in the last 72 hours Iron Studies: No results found for this basename: IRON, TIBC, TRANSFERRIN, FERRITIN,  in the last 72 hours  Studies/Results: Dg Chest 1 View  08/26/2012  *RADIOLOGY REPORT*  Clinical Data: Aspiration.  Patient undergoing CPAP.  CHEST - 1 VIEW  Comparison: Portable chest x-ray 08/21/2012, 06/11/2011.  Two-view chest x-ray 02/08/2011.  Findings: Suboptimal inspiration accounts for crowded bronchovascular markings, especially in the lung bases, and accentuates the cardiac silhouette.  Taking this into account, cardiac silhouette moderately enlarged but stable.  Interval development of airspace consolidation in the right upper lobe  and pulmonary venous hypertension since the examination 5 days ago.  No overt pulmonary edema.  IMPRESSION: Right upper lobe pneumonia.  Pulmonary venous hypertension without overt edema.  Stable cardiomegaly.   Original Report Authenticated By: Hulan Saas, M.D.    Ct Head Wo Contrast  08/25/2012  *RADIOLOGY REPORT*  Clinical Data: Evaluate for bleed or infarct.  On Coumadin. Altered mental status.  History high blood pressure.  CT HEAD WITHOUT CONTRAST  Technique:  Contiguous axial images were obtained from the base of the skull through the vertex without contrast.  Comparison: 04/26/2007.  Findings: No intracranial hemorrhage.  Remote infarct right lenticular nucleus.  Small vessel disease type changes without CT evidence of large acute infarct.  Global atrophy without hydrocephalus.  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  No other intracranial mass lesion noted on this unenhanced exam.  Vascular calcifications.  Mastoid air cells, middle ear cavities and visualized paranasal sinuses are clear.  Mild exophthalmos.  IMPRESSION: No intracranial hemorrhage or CT evidence of large acute infarct. Please see above.   Original Report Authenticated By: Lacy Duverney, M.D.    US Renal  08/26/2012  *RADIOLOGY REPORT*  Clinical Data: Renal insufficiency. 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  RENAL/URINARY TRACT ULTRASOUND COMPLETE  Comparison:  CT scan dated 03/09/2012  Findings:  Right Kidney:  Removed.  Left Kidney:  14.0 cm in length.  1 cm cyst on the lateral aspect of the lower pole, unchanged.  No hydronephrosis or renal cortical thinning.  Bladder:  The bladder  is empty with a Foley catheter in place.  IMPRESSION: Normal appearing left kidney.  Stable tiny cyst in the lower pole.   Original Report Authenticated By: Francene Boyers, M.D.     I have reviewed the patient's current medications.  Assessment/Plan: Problem #1 acute kidney injury superimposed on chronic. His  BUN and creatinine still remain high. Patient with underlying chronic renal failure her creatinine has been ranging between 1.3  To 1.37 about a year ago. The present increasing BUN and creatinine most likely ATN/prerenal. Patient is none oliguric. Problem #2 history of a trial fibrillation his heart rate is controlled Problem #3 next acid-base balance presently his pH is improving I also his PCO2 is declining a CO2 seems also to be going down. Problem #4 right of pneumonia Problem #5 history of renal cancer status post nephrectomy. Patient presently with single kidney and nephromegaly . Problem #6 anemia Problem #7 diabetes Problem #8 sleep apnea  Problem #9 metabolic bone disease calcium was in acceptable range, phosphorus is slightly high. Plan: We'll continue his IV hydration and increase the rate to Anderson 5 cc per hour We'll increase his Lasix to 100 mg IV twice a day to improve his urine output Check his basic metabolic panel in the morning.   LOS: 6 days   Eliyanna Ault S 08/27/2012,7:23 AM

## 2012-08-27 NOTE — Consult Note (Signed)
[facility] Shireen Rayburn A. Gerilyn Pilgrim, MD     [email or web address]          [name] is an 72 y.o. male.   ASSESSMENT/PLAN: Multifactorial toxic metabolic encephalopathy. Etiologies include acute renal failure, pneumonia, urinary tract infection, elevated PCO2/hypoventilation syndrome and medication effect. The patient's methadone will be discontinued for now. It can be restarted once the patient's cognition has improved at a lower dose. This can be started at 10 mg daily and increase as needed.  This is a 72 year old white male who presented to the hospital with chest discomfort and dyspnea. The patient was found to be in atrial fibrillation. He also was found to have several metabolic disturbances including acute renal failure and a urinary tract infection. The patient has had progressive deterioration in cognition and hence a neurological consultation. He also has been found to have pneumonia and elevated PCO2 indicating hypoventilation syndrome. He has been transferred to the ICU due to the increase in lethargy and elevated PCO2. He remains unresponsive and confused.  GENERAL: The patient is morbidly obese. He supposed to have a mask on but it is actually off at this time.  HEENT: Neck is supple. Head is normocephalic atraumatic. He has a large stocky neck.  ABDOMEN: soft  EXTREMITIES: No edema   BACK: Unremarkable.  SKIN: Normal by inspection.    MENTAL STATUS: The patient is laying in bed with eyes closed. He opens his eyes to deep painful stimuli but not to verbal commands. He does speak in 2-3 word sentences but is completely confused and speech is nonsensical. There is no dysarthria. He does not follow commands.  CRANIAL NERVES: Pupils are small to mid position and reactive. Corneal reflexes are intact. He has full extraocular movements with oculocephalic reflexes. Facial muscle strength is symmetric.  MOTOR: The patient moves both sides vigorously and well to deep  painful stimuli and spontaneously.  COORDINATION: There is no dysmetria. There is no tremors at this time. There is no parkinsonism.  REFLEXES: Deep tendon reflexes are symmetrical and normal. Plantar responses are extensor bilaterally.   SENSATION: He responds well to deep painful stimuli and pain bilaterally.  Past Medical History  Diagnosis Date  . Coronary artery disease   . Hypertension   . Cancer   . Pneumonia   . CHF (congestive heart failure)   . High cholesterol   . Diabetes mellitus   . Hepatitis   . Renal disorder     kidney cancer  . LBBB (left bundle branch block)   . Paroxysmal atrial fibrillation   . Sleep apnea     Past Surgical History  Procedure Laterality Date  . Back surgery    . Coronary angioplasty with stent placement    . Nephrectomy      Family History  Problem Relation Age of Onset  . Hyperlipidemia Mother   . Heart attack Father   . Diabetes Mother     Social History:  reports that he has quit smoking. He does not have any smokeless tobacco history on file. He reports that he does not drink alcohol or use illicit drugs.  Allergies: No Known Allergies  Medications: Prior to Admission medications   Medication Sig Start Date End Date Taking? Authorizing Provider  atenolol (TENORMIN) 50 MG tablet Take 50 mg by mouth daily.     Yes Historical Provider, MD  glipiZIDE (GLUCOTROL) 5 MG tablet Take 1 tablet (5 mg total) by mouth daily before breakfast. 06/10/11  Yes Isabella Stalling,  MD  hydrocodone-acetaminophen (LORCET-HD) 5-500 MG per capsule Take 1 capsule by mouth every 6 (six) hours as needed for pain. 06/16/12  Yes Richard Hyacinth Meeker, PA-C  insulin glargine (LANTUS) 100 UNIT/ML injection Inject 40 Units into the skin at bedtime.    Yes Historical Provider, MD  isosorbide mononitrate (IMDUR) 60 MG 24 hr tablet Take 60 mg by mouth daily.     Yes Historical Provider, MD  lisinopril (PRINIVIL,ZESTRIL) 40 MG tablet Take 0.5 tablets (20 mg total) by  mouth daily. 06/14/11  Yes Isabella Stalling, MD  metFORMIN (GLUCOPHAGE) 500 MG tablet Take 500 mg by mouth daily with breakfast. 06/14/11  Yes Isabella Stalling, MD  methadone (DOLOPHINE) 10 MG tablet Take 20 mg by mouth 2 (two) times daily.     Yes Historical Provider, MD  nitroGLYCERIN (NITROSTAT) 0.4 MG SL tablet Place 0.4 mg under the tongue every 5 (five) minutes as needed. Chest pain    Yes Historical Provider, MD  pantoprazole (PROTONIX) 40 MG tablet Take 40 mg by mouth daily.     Yes Historical Provider, MD  PARoxetine (PAXIL) 10 MG tablet Take 2 tablets (20 mg total) by mouth daily. 06/10/11  Yes Isabella Stalling, MD  diltiazem (CARDIZEM CD) 120 MG 24 hr capsule Take 1 capsule (120 mg total) by mouth daily. 08/25/12   Isabella Stalling, MD  warfarin (COUMADIN) 5 MG tablet Take 1 tablet (5 mg total) by mouth one time only at 6 PM. 08/25/12   Isabella Stalling, MD   Scheduled Meds: . ipratropium  0.5 mg Nebulization Q4H   And  . albuterol  2.5 mg Nebulization Q4H  . aspirin EC  325 mg Oral Daily  . atenolol  50 mg Oral Daily  . diltiazem  120 mg Oral Daily  . furosemide  100 mg Intravenous BID  . isosorbide mononitrate  60 mg Oral Daily  . methadone  20 mg Oral BID  . pantoprazole  40 mg Oral Daily  . PARoxetine  20 mg Oral Daily  . piperacillin-tazobactam (ZOSYN)  IV  3.375 g Intravenous Q8H  . vancomycin  1,000 mg Intravenous Q48H  . Warfarin - Pharmacist Dosing Inpatient   Does not apply q1800   Continuous Infusions: . sodium chloride 125 mL/hr at 08/27/12 0800  . dextrose 50 mL/hr at 08/25/12 1353   PRN Meds:.dextrose, nitroGLYCERIN   Blood pressure 114/53, pulse 87, temperature 99.2 F (37.3 C), temperature source Axillary, resp. rate 20, height 5' 7.5" (1.715 m), weight 114.9 kg (253 lb 4.9 oz), SpO2 93.00%.   Results for orders placed during the hospital encounter of 08/21/12 (from the past 48 hour(s))  GLUCOSE, CAPILLARY     Status: Abnormal   Collection Time     08/25/12 11:32 AM      Result Value Range   Glucose-Capillary 122 (*) 70 - 99 mg/dL   Comment 1 Notify RN    URINALYSIS, ROUTINE W REFLEX MICROSCOPIC     Status: Abnormal   Collection Time    08/25/12  4:10 PM      Result Value Range   Color, Urine YELLOW  YELLOW   APPearance CLEAR  CLEAR   Specific Gravity, Urine >1.030 (*) 1.005 - 1.030   pH 5.5  5.0 - 8.0   Glucose, UA NEGATIVE  NEGATIVE mg/dL   Hgb urine dipstick TRACE (*) NEGATIVE   Bilirubin Urine SMALL (*) NEGATIVE   Ketones, ur NEGATIVE  NEGATIVE mg/dL   Protein, ur TRACE (*) NEGATIVE  mg/dL   Urobilinogen, UA 1.0  0.0 - 1.0 mg/dL   Nitrite NEGATIVE  NEGATIVE   Leukocytes, UA NEGATIVE  NEGATIVE  URINE MICROSCOPIC-ADD ON     Status: Abnormal   Collection Time    08/25/12  4:10 PM      Result Value Range   WBC, UA 3-6  <3 WBC/hpf   RBC / HPF 3-6  <3 RBC/hpf   Bacteria, UA MANY (*) RARE   Casts HYALINE CASTS (*) NEGATIVE   Comment: GRANULAR CAST  URINE CULTURE     Status: None   Collection Time    08/25/12  4:12 PM      Result Value Range   Specimen Description URINE, CATHETERIZED     Special Requests NONE     Culture  Setup Time 08/26/2012 03:22     Colony Count NO GROWTH     Culture NO GROWTH     Report Status 08/26/2012 FINAL    GLUCOSE, CAPILLARY     Status: Abnormal   Collection Time    08/25/12  5:09 PM      Result Value Range   Glucose-Capillary 104 (*) 70 - 99 mg/dL  CULTURE, BLOOD (ROUTINE X 2)     Status: None   Collection Time    08/25/12  6:45 PM      Result Value Range   Specimen Description BLOOD LEFT ANTECUBITAL     Special Requests BOTTLES DRAWN AEROBIC AND ANAEROBIC 8CC EACH     Culture NO GROWTH 1 DAY     Report Status PENDING    CBC WITH DIFFERENTIAL     Status: Abnormal   Collection Time    08/25/12  6:45 PM      Result Value Range   WBC 6.4  4.0 - 10.5 K/uL   RBC 4.02 (*) 4.22 - 5.81 MIL/uL   Hemoglobin 9.8 (*) 13.0 - 17.0 g/dL   HCT 16.1 (*) 09.6 - 04.5 %   MCV 75.9 (*) 78.0 -  100.0 fL   MCH 24.4 (*) 26.0 - 34.0 pg   MCHC 32.1  30.0 - 36.0 g/dL   RDW 40.9 (*) 81.1 - 91.4 %   Platelets 261  150 - 400 K/uL   Neutrophils Relative 66  43 - 77 %   Neutro Abs 4.2  1.7 - 7.7 K/uL   Lymphocytes Relative 19  12 - 46 %   Lymphs Abs 1.2  0.7 - 4.0 K/uL   Monocytes Relative 11  3 - 12 %   Monocytes Absolute 0.7  0.1 - 1.0 K/uL   Eosinophils Relative 5  0 - 5 %   Eosinophils Absolute 0.3  0.0 - 0.7 K/uL   Basophils Relative 0  0 - 1 %   Basophils Absolute 0.0  0.0 - 0.1 K/uL  CULTURE, BLOOD (ROUTINE X 2)     Status: None   Collection Time    08/25/12  6:50 PM      Result Value Range   Specimen Description BLOOD LEFT HAND     Special Requests BOTTLES DRAWN AEROBIC AND ANAEROBIC 8CC EACH     Culture NO GROWTH 1 DAY     Report Status PENDING    GLUCOSE, CAPILLARY     Status: Abnormal   Collection Time    08/25/12  8:31 PM      Result Value Range   Glucose-Capillary 119 (*) 70 - 99 mg/dL  GLUCOSE, CAPILLARY     Status: Abnormal   Collection  Time    08/25/12 11:55 PM      Result Value Range   Glucose-Capillary 129 (*) 70 - 99 mg/dL  PROTIME-INR     Status: Abnormal   Collection Time    08/26/12  4:50 AM      Result Value Range   Prothrombin Time 22.4 (*) 11.6 - 15.2 seconds   INR 2.06 (*) 0.00 - 1.49  CBC     Status: Abnormal   Collection Time    08/26/12  4:50 AM      Result Value Range   WBC 5.3  4.0 - 10.5 K/uL   RBC 4.46  4.22 - 5.81 MIL/uL   Hemoglobin 10.9 (*) 13.0 - 17.0 g/dL   HCT 16.1 (*) 09.6 - 04.5 %   MCV 76.2 (*) 78.0 - 100.0 fL   MCH 24.4 (*) 26.0 - 34.0 pg   MCHC 32.1  30.0 - 36.0 g/dL   RDW 40.9 (*) 81.1 - 91.4 %   Platelets 260  150 - 400 K/uL  BASIC METABOLIC PANEL     Status: Abnormal   Collection Time    08/26/12  4:50 AM      Result Value Range   Sodium 130 (*) 135 - 145 mEq/L   Potassium 5.0  3.5 - 5.1 mEq/L   Chloride 95 (*) 96 - 112 mEq/L   CO2 24  19 - 32 mEq/L   Glucose, Bld 137 (*) 70 - 99 mg/dL   BUN 54 (*) 6 - 23  mg/dL   Creatinine, Ser 7.82 (*) 0.50 - 1.35 mg/dL   Calcium 8.6  8.4 - 95.6 mg/dL   GFR calc non Af Amer 16 (*) >90 mL/min   GFR calc Af Amer 19 (*) >90 mL/min   Comment:            The eGFR has been calculated     using the CKD EPI equation.     This calculation has not been     validated in all clinical     situations.     eGFR's persistently     <90 mL/min signify     possible Chronic Kidney Disease.  SEDIMENTATION RATE     Status: Abnormal   Collection Time    08/26/12  4:50 AM      Result Value Range   Sed Rate 38 (*) 0 - 16 mm/hr  RETICULOCYTES     Status: None   Collection Time    08/26/12  4:50 AM      Result Value Range   Retic Ct Pct 1.0  0.4 - 3.1 %   RBC. 4.42  4.22 - 5.81 MIL/uL   Retic Count, Manual 44.2  19.0 - 186.0 K/uL  BLOOD GAS, ARTERIAL     Status: Abnormal   Collection Time    08/26/12  6:15 AM      Result Value Range   FIO2 100.00     Delivery systems NON-REBREATHER OXYGEN MASK     pH, Arterial 7.149 (*) 7.350 - 7.450   Comment: CRITICAL RESULT CALLED TO, READ BACK BY AND VERIFIED WITH:     [facility] RN AT 2130 08/26/12 ANDERSON S.RRT/RCP   pCO2 arterial 70.3 (*) 35.0 - 45.0 mmHg   Comment: CRITICAL RESULT CALLED TO, READ BACK BY AND VERIFIED WITH:     WEBB C. RN AT 8657 08/26/12 ANDERSON,S RRT/RCP   pO2, Arterial 180.0 (*) 80.0 - 100.0 mmHg   Bicarbonate 23.4  20.0 - 24.0 mEq/L  TCO2 22.3  0 - 100 mmol/L   Acid-base deficit 4.3 (*) 0.0 - 2.0 mmol/L   O2 Saturation 99.3     Patient temperature 37.0     Collection site LEFT RADIAL     Drawn by [id number]     Sample type ARTERIAL     Allens test (pass/fail) PASS  PASS  URINALYSIS, ROUTINE W REFLEX MICROSCOPIC     Status: Abnormal   Collection Time    08/26/12  7:27 AM      Result Value Range   Color, Urine RED (*) YELLOW   Comment: BIOCHEMICALS MAY BE AFFECTED BY COLOR   APPearance TURBID (*) CLEAR   Specific Gravity, Urine 1.025  1.005 - 1.030   pH 7.0  5.0 - 8.0   Glucose, UA 100 (*) NEGATIVE  mg/dL   Hgb urine dipstick LARGE (*) NEGATIVE   Bilirubin Urine LARGE (*) NEGATIVE   Ketones, ur 15 (*) NEGATIVE mg/dL   Protein, ur >147 (*) NEGATIVE mg/dL   Urobilinogen, UA >8.2 (*) 0.0 - 1.0 mg/dL   Nitrite POSITIVE (*) NEGATIVE   Leukocytes, UA MODERATE (*) NEGATIVE  URINE MICROSCOPIC-ADD ON     Status: Abnormal   Collection Time    08/26/12  7:27 AM      Result Value Range   WBC, UA 3-6  <3 WBC/hpf   RBC / HPF TOO NUMEROUS TO COUNT  <3 RBC/hpf   Bacteria, UA MANY (*) RARE  GLUCOSE, CAPILLARY     Status: Abnormal   Collection Time    08/26/12  7:43 AM      Result Value Range   Glucose-Capillary 170 (*) 70 - 99 mg/dL   Comment 1 Documented in Chart     Comment 2 Notify RN    BLOOD GAS, ARTERIAL     Status: Abnormal   Collection Time    08/26/12  9:50 AM      Result Value Range   FIO2 35.00     Delivery systems BILEVEL POSITIVE AIRWAY PRESSURE     Mode BILEVEL POSITIVE AIRWAY PRESSURE     Inspiratory PAP 16     Expiratory PAP 8     pH, Arterial 7.230 (*) 7.350 - 7.450   pCO2 arterial 54.9 (*) 35.0 - 45.0 mmHg   pO2, Arterial 90.4  80.0 - 100.0 mmHg   Bicarbonate 22.1  20.0 - 24.0 mEq/L   TCO2 21.3  0 - 100 mmol/L   Acid-base deficit 4.3 (*) 0.0 - 2.0 mmol/L   O2 Saturation 96.9     Patient temperature 37.0     Collection site RIGHT RADIAL     Drawn by COLLECTED BY RT     Sample type ARTERIAL     Allens test (pass/fail) PASS  PASS  GLUCOSE, CAPILLARY     Status: Abnormal   Collection Time    08/26/12 12:52 PM      Result Value Range   Glucose-Capillary 148 (*) 70 - 99 mg/dL  SODIUM, URINE, RANDOM     Status: None   Collection Time    08/26/12  1:00 PM      Result Value Range   Sodium, Ur 22    CREATININE, URINE, RANDOM     Status: None   Collection Time    08/26/12  1:00 PM      Result Value Range   Creatinine, Urine 261.80    PROTEIN, URINE, RANDOM     Status: None   Collection Time  08/26/12  1:00 PM      Result Value Range   Total Protein, Urine  216     Comment: NO NORMAL RANGE ESTABLISHED FOR THIS TEST  VITAMIN D 25 HYDROXY     Status: Abnormal   Collection Time    08/26/12  1:12 PM      Result Value Range   Vit D, 25-Hydroxy 25 (*) 30 - 89 ng/mL   Comment: (NOTE)     This assay accurately quantifies Vitamin D, which is the sum of the     25-Hydroxy forms of Vitamin D2 and D3.  Studies have shown that the     optimum concentration of 25-Hydroxy Vitamin D is 30 ng/mL or higher.      Concentrations of Vitamin D between 20 and 29 ng/mL are considered to     be insufficient and concentrations less than 20 ng/mL are considered     to be deficient for Vitamin D.  PHOSPHORUS     Status: Abnormal   Collection Time    08/26/12  1:12 PM      Result Value Range   Phosphorus 5.6 (*) 2.3 - 4.6 mg/dL  GLUCOSE, CAPILLARY     Status: Abnormal   Collection Time    08/26/12  4:46 PM      Result Value Range   Glucose-Capillary 135 (*) 70 - 99 mg/dL   Comment 1 Documented in Chart     Comment 2 Notify RN    GLUCOSE, CAPILLARY     Status: Abnormal   Collection Time    08/26/12  8:54 PM      Result Value Range   Glucose-Capillary 133 (*) 70 - 99 mg/dL  GLUCOSE, CAPILLARY     Status: Abnormal   Collection Time    08/27/12 12:13 AM      Result Value Range   Glucose-Capillary 120 (*) 70 - 99 mg/dL   Comment 1 Documented in Chart     Comment 2 Notify RN    BLOOD GAS, ARTERIAL     Status: Abnormal   Collection Time    08/27/12  3:50 AM      Result Value Range   FIO2 25.00     Delivery systems BILEVEL POSITIVE AIRWAY PRESSURE     Inspiratory PAP 16     Expiratory PAP 8     pH, Arterial 7.230 (*) 7.350 - 7.450   pCO2 arterial 52.1 (*) 35.0 - 45.0 mmHg   pO2, Arterial 68.5 (*) 80.0 - 100.0 mmHg   Bicarbonate 21.1  20.0 - 24.0 mEq/L   TCO2 20.7  0 - 100 mmol/L   Acid-base deficit 5.3 (*) 0.0 - 2.0 mmol/L   O2 Saturation 92.6     Patient temperature 37.0     Collection site RIGHT RADIAL     Drawn by [id number]     Sample type ARTERIAL       Allens test (pass/fail) PASS  PASS  PROTIME-INR     Status: Abnormal   Collection Time    08/27/12  5:05 AM      Result Value Range   Prothrombin Time 28.4 (*) 11.6 - 15.2 seconds   INR 2.84 (*) 0.00 - 1.49  BASIC METABOLIC PANEL     Status: Abnormal   Collection Time    08/27/12  5:05 AM      Result Value Range   Sodium 135  135 - 145 mEq/L   Potassium 3.9  3.5 - 5.1 mEq/L  Comment: DELTA CHECK NOTED   Chloride 105  96 - 112 mEq/L   CO2 18 (*) 19 - 32 mEq/L   Glucose, Bld 107 (*) 70 - 99 mg/dL   BUN 57 (*) 6 - 23 mg/dL   Creatinine, Ser 1.47 (*) 0.50 - 1.35 mg/dL   Calcium 7.0 (*) 8.4 - 10.5 mg/dL   GFR calc non Af Amer 15 (*) >90 mL/min   GFR calc Af Amer 17 (*) >90 mL/min   Comment:            The eGFR has been calculated     using the CKD EPI equation.     This calculation has not been     validated in all clinical     situations.     eGFR's persistently     <90 mL/min signify     possible Chronic Kidney Disease.  HEPATIC FUNCTION PANEL     Status: Abnormal   Collection Time    08/27/12  5:05 AM      Result Value Range   Total Protein 5.2 (*) 6.0 - 8.3 g/dL   Albumin 2.2 (*) 3.5 - 5.2 g/dL   AST 8  0 - 37 U/L   ALT 8  0 - 53 U/L   Alkaline Phosphatase 47  39 - 117 U/L   Total Bilirubin 0.3  0.3 - 1.2 mg/dL   Bilirubin, Direct 0.2  0.0 - 0.3 mg/dL   Indirect Bilirubin 0.1 (*) 0.3 - 0.9 mg/dL  GLUCOSE, CAPILLARY     Status: Abnormal   Collection Time    08/27/12  7:27 AM      Result Value Range   Glucose-Capillary 118 (*) 70 - 99 mg/dL   Comment 1 Documented in Chart     Comment 2 Notify RN      Dg Chest 1 View  08/26/2012  *RADIOLOGY REPORT*  Clinical Data: Aspiration.  Patient undergoing CPAP.  CHEST - 1 VIEW  Comparison: Portable chest x-ray 08/21/2012, 06/11/2011.  Two-view chest x-ray 02/08/2011.  Findings: Suboptimal inspiration accounts for crowded bronchovascular markings, especially in the lung bases, and accentuates the cardiac silhouette.  Taking  this into account, cardiac silhouette moderately enlarged but stable.  Interval development of airspace consolidation in the right upper lobe and pulmonary venous hypertension since the examination 5 days ago.  No overt pulmonary edema.  IMPRESSION: Right upper lobe pneumonia.  Pulmonary venous hypertension without overt edema.  Stable cardiomegaly.   Original Report Authenticated By: Hulan Saas, M.D.    Ct Head Wo Contrast  08/25/2012  *RADIOLOGY REPORT*  Clinical Data: Evaluate for bleed or infarct.  On Coumadin. Altered mental status.  History high blood pressure.  CT HEAD WITHOUT CONTRAST  Technique:  Contiguous axial images were obtained from the base of the skull through the vertex without contrast.  Comparison: 04/26/2007.  Findings: No intracranial hemorrhage.  Remote infarct right lenticular nucleus.  Small vessel disease type changes without CT evidence of large acute infarct.  Global atrophy without hydrocephalus.  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  No other intracranial mass lesion noted on this unenhanced exam.  Vascular calcifications.  Mastoid air cells, middle ear cavities and visualized paranasal sinuses are clear.  Mild exophthalmos.  IMPRESSION: No intracranial hemorrhage or CT evidence of large acute infarct. Please see above.   Original Report Authenticated By: Lacy Duverney, M.D.    US Renal  08/26/2012  *RADIOLOGY REPORT*  Clinical Data: Renal insufficiency. 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  RENAL/URINARY TRACT ULTRASOUND COMPLETE  Comparison:  CT scan dated 03/09/2012  Findings:  Right Kidney:  Removed.  Left Kidney:  14.0 cm in length.  1 cm cyst on the lateral aspect of the lower pole, unchanged.  No hydronephrosis or renal cortical thinning.  Bladder:  The bladder is empty with a Foley catheter in place.  IMPRESSION: Normal appearing left kidney.  Stable tiny cyst in the lower pole.   Original Report Authenticated By: Francene Boyers,  M.D.    Dg Chest Port 1 View  08/27/2012  *RADIOLOGY REPORT*  Clinical Data: To torus failure  PORTABLE CHEST - 1 VIEW  Comparison: Portable chest x-ray of 08/26/2012  Findings: There is more patchy opacities bilaterally.  Although this could represent asymmetrical edema, pneumonia is a definite consideration.  Cardiomegaly is stable.  No bony abnormality is seen.  IMPRESSION: Some worsening of patchy airspace disease.  Possible asymmetrical edema but consider pneumonia.   Original Report Authenticated By: Dwyane Dee, M.D.         Trinna Kunst A. Gerilyn Pilgrim, M.D.  Diplomate, Biomedical engineer of Psychiatry and Neurology ( Neurology). 08/27/2012, 8:30 AM

## 2012-08-28 ENCOUNTER — Inpatient Hospital Stay (HOSPITAL_COMMUNITY): Payer: Medicare Other

## 2012-08-28 LAB — BASIC METABOLIC PANEL
BUN: 54 mg/dL — ABNORMAL HIGH (ref 6–23)
Calcium: 9.6 mg/dL (ref 8.4–10.5)
Calcium: 9.7 mg/dL (ref 8.4–10.5)
Creatinine, Ser: 3.53 mg/dL — ABNORMAL HIGH (ref 0.50–1.35)
Creatinine, Ser: 2.63 mg/dL — ABNORMAL HIGH (ref 0.50–1.35)
GFR calc Af Amer: 19 mL/min — ABNORMAL LOW (ref >90)
GFR calc Af Amer: 27 mL/min — ABNORMAL LOW (ref >90)
GFR calc non Af Amer: 16 mL/min — ABNORMAL LOW (ref >90)
GFR calc non Af Amer: 23 mL/min — ABNORMAL LOW (ref >90)

## 2012-08-28 LAB — HEPATIC FUNCTION PANEL
Alkaline Phosphatase: 73 U/L (ref 39–117)
Indirect Bilirubin: 0.2 mg/dL — ABNORMAL LOW (ref 0.3–0.9)
Total Protein: 7.6 g/dL (ref 6.0–8.3)

## 2012-08-28 LAB — URINE CULTURE: Culture: NO GROWTH

## 2012-08-28 LAB — GLUCOSE, CAPILLARY
Glucose-Capillary: 141 mg/dL — ABNORMAL HIGH (ref 70–99)
Glucose-Capillary: 154 mg/dL — ABNORMAL HIGH (ref 70–99)

## 2012-08-28 LAB — CBC
MCH: 24.6 pg — ABNORMAL LOW (ref 26.0–34.0)
MCHC: 32.5 g/dL (ref 30.0–36.0)
MCV: 75.7 fL — ABNORMAL LOW (ref 78.0–100.0)
Platelets: 249 10*3/uL (ref 150–400)
RDW: 16.5 % — ABNORMAL HIGH (ref 11.5–15.5)

## 2012-08-28 LAB — IRON AND TIBC: Saturation Ratios: 3 % — ABNORMAL LOW (ref 20–55)

## 2012-08-28 LAB — PROTIME-INR: Prothrombin Time: 26 seconds — ABNORMAL HIGH (ref 11.6–15.2)

## 2012-08-28 MED ORDER — CALCIUM ACETATE 667 MG PO CAPS
667.0000 mg | ORAL_CAPSULE | Freq: Three times a day (TID) | ORAL | Status: DC
  Administered 2012-08-28 – 2012-08-30 (×4): 667 mg via ORAL
  Filled 2012-08-28 (×5): qty 1

## 2012-08-28 MED ORDER — DILTIAZEM HCL 25 MG/5ML IV SOLN
INTRAVENOUS | Status: AC
  Filled 2012-08-28: qty 5

## 2012-08-28 MED ORDER — DILTIAZEM HCL 100 MG IV SOLR
5.0000 mg/h | INTRAVENOUS | Status: DC
  Administered 2012-08-28: 5 mg/h via INTRAVENOUS
  Administered 2012-08-29: 2.5 mg/h via INTRAVENOUS

## 2012-08-28 MED ORDER — WARFARIN SODIUM 2.5 MG PO TABS
2.5000 mg | ORAL_TABLET | Freq: Once | ORAL | Status: AC
  Administered 2012-08-28: 2.5 mg via ORAL
  Filled 2012-08-28: qty 1

## 2012-08-28 NOTE — Progress Notes (Signed)
ANTIBIOTIC CONSULT NOTE  Pharmacy Consult for Vancomycin & Zosyn Indication: pneumonia  No Known Allergies  Patient Measurements: Height: 5' 7.5" (171.5 cm) Weight: 245 lb 9.5 oz (111.4 kg) IBW/kg (Calculated) : 67.25  Vital Signs: Temp: 99.1 F (37.3 C) (03/21 0722) Temp src: Axillary (03/21 0400) BP: 151/90 mmHg (03/21 0722) Pulse Rate: 91 (03/21 0722) Intake/Output from previous day: 03/20 0701 - 03/21 0700 In: 3145 [I.V.:2875; IV Piggyback:270] Out: 5925 [Urine:5925] Intake/Output from this shift:    Labs:  Recent Labs  08/26/12 0450 08/26/12 1300 08/27/12 0505 08/27/12 0902 08/28/12 0456  WBC 5.3  --   --  7.2 8.7  HGB 10.9*  --   --  9.0* 10.1*  PLT 260  --   --  188 249  LABCREA  --  261.80  --   --   --   CREATININE 3.55*  --  3.82*  --  3.53*   Estimated Creatinine Clearance: 23.7 ml/min (by C-G formula based on Cr of 3.53). No results found for this basename: VANCOTROUGH, Leodis Binet, VANCORANDOM, GENTTROUGH, GENTPEAK, GENTRANDOM, TOBRATROUGH, TOBRAPEAK, TOBRARND, AMIKACINPEAK, AMIKACINTROU, AMIKACIN,  in the last 72 hours   Microbiology: Recent Results (from the past 720 hour(s))  MRSA PCR SCREENING     Status: None   Collection Time    08/21/12  6:45 PM      Result Value Range Status   MRSA by PCR NEGATIVE  NEGATIVE Final   Comment:            The GeneXpert MRSA Assay (FDA     approved for NASAL specimens     only), is one component of a     comprehensive MRSA colonization     surveillance program. It is not     intended to diagnose MRSA     infection nor to guide or     monitor treatment for     MRSA infections.  URINE CULTURE     Status: None   Collection Time    08/25/12  4:12 PM      Result Value Range Status   Specimen Description URINE, CATHETERIZED   Final   Special Requests NONE   Final   Culture  Setup Time 08/26/2012 03:22   Final   Colony Count NO GROWTH   Final   Culture NO GROWTH   Final   Report Status 08/26/2012 FINAL    Final  CULTURE, BLOOD (ROUTINE X 2)     Status: None   Collection Time    08/25/12  6:45 PM      Result Value Range Status   Specimen Description BLOOD LEFT ANTECUBITAL   Final   Special Requests BOTTLES DRAWN AEROBIC AND ANAEROBIC 8CC EACH   Final   Culture NO GROWTH 3 DAYS   Final   Report Status PENDING   Incomplete  CULTURE, BLOOD (ROUTINE X 2)     Status: None   Collection Time    08/25/12  6:50 PM      Result Value Range Status   Specimen Description BLOOD LEFT HAND   Final   Special Requests BOTTLES DRAWN AEROBIC AND ANAEROBIC 8CC EACH   Final   Culture NO GROWTH 3 DAYS   Final   Report Status PENDING   Incomplete  URINE CULTURE     Status: None   Collection Time    08/26/12  7:27 AM      Result Value Range Status   Specimen Description URINE, CLEAN CATCH   Final  Special Requests NONE   Final   Culture  Setup Time 08/27/2012 02:59   Final   Colony Count NO GROWTH   Final   Culture NO GROWTH   Final   Report Status 08/28/2012 FINAL   Final    Medical History: Past Medical History  Diagnosis Date  . Coronary artery disease   . Hypertension   . Cancer   . Pneumonia   . CHF (congestive heart failure)   . High cholesterol   . Diabetes mellitus   . Hepatitis   . Renal disorder     kidney cancer  . LBBB (left bundle branch block)   . Paroxysmal atrial fibrillation   . Sleep apnea     Medications:  Scheduled:  . ipratropium  0.5 mg Nebulization Q4H   And  . albuterol  2.5 mg Nebulization Q4H  . aspirin EC  325 mg Oral Daily  . atenolol  50 mg Oral Daily  . diltiazem  120 mg Oral Daily  . furosemide  100 mg Intravenous BID  . nitroGLYCERIN  1 inch Topical Daily  . pantoprazole (PROTONIX) IV  40 mg Intravenous Q24H  . PARoxetine  20 mg Oral Daily  . piperacillin-tazobactam (ZOSYN)  IV  3.375 g Intravenous Q8H  . vancomycin  1,000 mg Intravenous Q48H  . warfarin  1 mg Oral ONCE-1800  . warfarin  2.5 mg Oral ONCE-1800  . Warfarin - Pharmacist Dosing  Inpatient   Does not apply q1800  . [DISCONTINUED] isosorbide mononitrate  60 mg Oral Daily  . [DISCONTINUED] methadone  20 mg Oral BID  . [DISCONTINUED] nitroGLYCERIN  1 inch Topical Q24H  . [DISCONTINUED] pantoprazole  40 mg Oral Daily   Assessment: [name] who has been hospitalized since 3/14 for Afib.  He declined prior to discharge on 3/18 & was noted to be hypoxic with new infiltrate per CXR.   He is currently on day#3 broad spectrum antibiotics for HCAP/aspiration PNA. Acute on chronic renal failure.   Vanc 3/19>> Zosyn 3/19>>   Goal of Therapy:  Vancomycin trough level 15-20 mcg/ml  Plan:  1) Continue Zosyn 3.375gm IV Q8h to be infused over 4hrs 2) Continue Vancomycin 1gm IV Q48h 3) Check Vancomycin trough at steady state 4) Monitor renal function and cx data   Elson Clan 08/28/2012,7:45 AM

## 2012-08-28 NOTE — Progress Notes (Signed)
Subjective: He is generally better. He is more alert. He is still coughing and congested  Objective: Vital signs in last 24 hours: Temp:  [98.6 F (37 C)-99.6 F (37.6 C)] 99.1 F (37.3 C) (03/21 0722) Pulse Rate:  [25-125] 91 (03/21 0722) Resp:  [12-22] 16 (03/21 0722) BP: (99-151)/(49-96) 151/90 mmHg (03/21 0722) SpO2:  [92 %-100 %] 96 % (03/21 0722) FiO2 (%):  [25 %] 25 % (03/21 0720) Weight:  [111.4 kg (245 lb 9.5 oz)] 111.4 kg (245 lb 9.5 oz) (03/21 0500) Weight change: -3.5 kg (-7 lb 11.5 oz) Last BM Date: 08/23/12  Intake/Output from previous day: 03/20 0701 - 03/21 0700 In: 3145 [I.V.:2875; IV Piggyback:270] Out: 5925 [Urine:5925]  PHYSICAL EXAM General appearance: alert and mild distress Resp: rhonchi bilaterally Cardio: regular rate and rhythm, S1, S2 normal, no murmur, click, rub or gallop GI: soft, non-tender; bowel sounds normal; no masses,  no organomegaly Extremities: extremities normal, atraumatic, no cyanosis or edema  Lab Results:    Basic Metabolic Panel:  Recent Labs  65/78/46 0450 08/26/12 1312 08/27/12 0505 08/28/12 0456  NA 130*  --  135 144  K 5.0  --  3.9 4.5  CL 95*  --  105 102  CO2 24  --  18* 25  GLUCOSE 137*  --  107* 150*  BUN 54*  --  57* 60*  CREATININE 3.55*  --  3.82* 3.53*  CALCIUM 8.6 8.1* 7.0* 9.6  PHOS  --  5.6*  --   --    Liver Function Tests:  Recent Labs  08/27/12 0505 08/28/12 0456  AST 8 11  ALT 8 11  ALKPHOS 47 73  BILITOT 0.3 0.6  PROT 5.2* 7.6  ALBUMIN 2.2* 2.9*   No results found for this basename: LIPASE, AMYLASE,  in the last 72 hours  Recent Labs  08/27/12 0902  AMMONIA 36   CBC:  Recent Labs  08/25/12 1845  08/27/12 0902 08/28/12 0456  WBC 6.4  < > 7.2 8.7  NEUTROABS 4.2  --   --   --   HGB 9.8*  < > 9.0* 10.1*  HCT 30.5*  < > 27.7* 31.1*  MCV 75.9*  < > 75.3* 75.7*  PLT 261  < > 188 249  < > = values in this interval not displayed. Cardiac Enzymes: No results found for this  basename: CKTOTAL, CKMB, CKMBINDEX, TROPONINI,  in the last 72 hours BNP: No results found for this basename: PROBNP,  in the last 72 hours D-Dimer: No results found for this basename: DDIMER,  in the last 72 hours CBG:  Recent Labs  08/27/12 1158 08/27/12 1625 08/27/12 1930 08/27/12 2336 08/28/12 0340 08/28/12 0724  GLUCAP 131* 119* 136* 131* 141* 154*   Hemoglobin A1C: No results found for this basename: HGBA1C,  in the last 72 hours Fasting Lipid Panel: No results found for this basename: CHOL, HDL, LDLCALC, TRIG, CHOLHDL, LDLDIRECT,  in the last 72 hours Thyroid Function Tests:  Recent Labs  08/27/12 0902  TSH 1.765   Anemia Panel:  Recent Labs  08/26/12 0450 08/27/12 0902  VITAMINB12  --  238  FOLATE  --  6.8  RETICCTPCT 1.0  --    Coagulation:  Recent Labs  08/27/12 0505 08/28/12 0456  LABPROT 28.4* 26.0*  INR 2.84* 2.52*   Urine Drug Screen: Drugs of Abuse  No results found for this basename: labopia, cocainscrnur, labbenz, amphetmu, thcu, labbarb    Alcohol Level: No results found for  this basename: ETH,  in the last 72 hours Urinalysis:  Recent Labs  08/25/12 1610 08/26/12 0727  COLORURINE YELLOW RED*  LABSPEC >1.030* 1.025  PHURINE 5.5 7.0  GLUCOSEU NEGATIVE 100*  HGBUR TRACE* LARGE*  BILIRUBINUR SMALL* LARGE*  KETONESUR NEGATIVE 15*  PROTEINUR TRACE* >300*  UROBILINOGEN 1.0 >8.0*  NITRITE NEGATIVE POSITIVE*  LEUKOCYTESUR NEGATIVE MODERATE*   Misc. Labs:  ABGS  Recent Labs  08/27/12 0350  PHART 7.230*  PO2ART 68.5*  TCO2 20.7  HCO3 21.1   CULTURES Recent Results (from the past 240 hour(s))  MRSA PCR SCREENING     Status: None   Collection Time    08/21/12  6:45 PM      Result Value Range Status   MRSA by PCR NEGATIVE  NEGATIVE Final   Comment:            The GeneXpert MRSA Assay (FDA     approved for NASAL specimens     only), is one component of a     comprehensive MRSA colonization     surveillance program. It  is not     intended to diagnose MRSA     infection nor to guide or     monitor treatment for     MRSA infections.  URINE CULTURE     Status: None   Collection Time    08/25/12  4:12 PM      Result Value Range Status   Specimen Description URINE, CATHETERIZED   Final   Special Requests NONE   Final   Culture  Setup Time 08/26/2012 03:22   Final   Colony Count NO GROWTH   Final   Culture NO GROWTH   Final   Report Status 08/26/2012 FINAL   Final  CULTURE, BLOOD (ROUTINE X 2)     Status: None   Collection Time    08/25/12  6:45 PM      Result Value Range Status   Specimen Description BLOOD LEFT ANTECUBITAL   Final   Special Requests BOTTLES DRAWN AEROBIC AND ANAEROBIC 8CC EACH   Final   Culture NO GROWTH 3 DAYS   Final   Report Status PENDING   Incomplete  CULTURE, BLOOD (ROUTINE X 2)     Status: None   Collection Time    08/25/12  6:50 PM      Result Value Range Status   Specimen Description BLOOD LEFT HAND   Final   Special Requests BOTTLES DRAWN AEROBIC AND ANAEROBIC 8CC EACH   Final   Culture NO GROWTH 3 DAYS   Final   Report Status PENDING   Incomplete  URINE CULTURE     Status: None   Collection Time    08/26/12  7:27 AM      Result Value Range Status   Specimen Description URINE, CLEAN CATCH   Final   Special Requests NONE   Final   Culture  Setup Time 08/27/2012 02:59   Final   Colony Count NO GROWTH   Final   Culture NO GROWTH   Final   Report Status 08/28/2012 FINAL   Final   Studies/Results: US Renal  08/26/2012  *RADIOLOGY REPORT*  Clinical Data: Renal insufficiency. 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  RENAL/URINARY TRACT ULTRASOUND COMPLETE  Comparison:  CT scan dated 03/09/2012  Findings:  Right Kidney:  Removed.  Left Kidney:  14.0 cm in length.  1 cm cyst on the lateral aspect of the lower pole, unchanged.  No hydronephrosis or renal  cortical thinning.  Bladder:  The bladder is empty with a Foley catheter in place.  IMPRESSION: Normal appearing  left kidney.  Stable tiny cyst in the lower pole.   Original Report Authenticated By: Francene Boyers, M.D.    Dg Chest Port 1 View  08/28/2012  *RADIOLOGY REPORT*  Clinical Data: 72 year old male with respiratory failure.  PORTABLE CHEST - 1 VIEW  Comparison: 08/27/2012 and earlier.  Findings: Portable semi upright AP view 0645 hours.  Interval regression of multilobar are and bilateral (right greater than left) fluffy pulmonary opacity. Stable cardiomegaly and mediastinal contours.  No large effusion.  No pneumothorax.  No areas of worsening ventilation.  IMPRESSION: Interval regressed but not resolved multilobar bilateral pulmonary opacity.  I favor asymmetric pulmonary edema over pneumonia. Cardiomegaly.   Original Report Authenticated By: Erskine Speed, M.D.    Dg Chest Port 1 View  08/27/2012  *RADIOLOGY REPORT*  Clinical Data: To torus failure  PORTABLE CHEST - 1 VIEW  Comparison: Portable chest x-ray of 08/26/2012  Findings: There is more patchy opacities bilaterally.  Although this could represent asymmetrical edema, pneumonia is a definite consideration.  Cardiomegaly is stable.  No bony abnormality is seen.  IMPRESSION: Some worsening of patchy airspace disease.  Possible asymmetrical edema but consider pneumonia.   Original Report Authenticated By: Dwyane Dee, M.D.     Medications:  Scheduled: . ipratropium  0.5 mg Nebulization Q4H   And  . albuterol  2.5 mg Nebulization Q4H  . aspirin EC  325 mg Oral Daily  . atenolol  50 mg Oral Daily  . calcium acetate  667 mg Oral TID WC  . diltiazem  120 mg Oral Daily  . furosemide  100 mg Intravenous BID  . nitroGLYCERIN  1 inch Topical Daily  . pantoprazole (PROTONIX) IV  40 mg Intravenous Q24H  . PARoxetine  20 mg Oral Daily  . piperacillin-tazobactam (ZOSYN)  IV  3.375 g Intravenous Q8H  . vancomycin  1,000 mg Intravenous Q48H  . warfarin  2.5 mg Oral ONCE-1800  . Warfarin - Pharmacist Dosing Inpatient   Does not apply q1800    Continuous: . sodium chloride 125 mL/hr at 08/28/12 0621  . dextrose 50 mL/hr at 08/25/12 1353   WJX:[id number], metoprolol, nitroGLYCERIN  Assesment: He was admitted with atrial fibrillation with rapid ventricular response. He is being prepared for discharge when he developed confusion eventually aspirated and is now being treated for healthcare associated pneumonia and he also developed severe metabolic encephalopathy which is improving. He had respiratory failure which is improving. Principal Problem:   CAD (coronary artery disease), native coronary artery Active Problems:   Atrial fibrillation with rapid ventricular response   IDDM (insulin dependent diabetes mellitus)   Hyperlipidemia LDL goal < 70   Hypertension associated with diabetes   Degenerative disc disease, lumbar   Illiterate    Plan: Continue BiPAP as needed continue with antibiotic treatment    LOS: 7 days   Laina Guerrieri L 08/28/2012, 8:43 AM

## 2012-08-28 NOTE — Progress Notes (Signed)
He developed recurrent  Atrial fib with RVR. He was given iv metoprolol which helped only briefly. He will start iv diltiazem.

## 2012-08-28 NOTE — Progress Notes (Signed)
[id number] 

## 2012-08-28 NOTE — Progress Notes (Signed)
[name], [name]                ACCOUNT NO.:  [id number]  MEDICAL RECORD NO.:  [id number]  LOCATION:  IC06                          FACILITY:  [facility]  PHYSICIAN:  Melvyn Novas, MDDATE OF BIRTH:  1941/06/08  DATE OF PROCEDURE: DATE OF DISCHARGE:                                PROGRESS NOTE   The patient has altered mental status, lethargy. 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  The patient was admitted with paroxysmal AFib, reverted to sinus rhythm with amiodarone, currently remains in sinus rhythm.  Ripped out his IV yesterday, vomited in the middle of the night and had possible aspiration pneumonia in the right lobe which is hospital-acquired and was seen in consultation by Pulmonology.  The patient was placed on antibiotics vanc and Zosyn and with DuoNeb nebulizers and has had a progressive altered mental status since. Dementia panel was sent, TSH was sent.  Serum ammonia level pending at the time of this.  Neurology consultation was called after CAT scan, has been seen today, I do not see his note as of yet.  The patient has insulin-dependent diabetes, in fair glycemic control here.  He has had right nephrectomy and left kidney measures 14 cm in length with 1 cyst which shows no evidence of hydronephrosis or renal cortical thinning, normal appearing left kidney.  He has worsening renal failure with creatinine bumping up to 3.82, seen in consultation by Nephrology, currently giving Lasix.  His hemoglobin has been drifting downwards. Stools for Hemoccult are ordered and pending.  An anemia panel will be sent.  Lungs show diminished breath sounds at the bases.  Scattered rhonchi.  No rales audible. Heart, regular rhythm, no S3, S4.  Blood pressure 126/54, temperature 99.2, pulse 94 and regular, respiratory rate is 17.  IMPRESSION: 1. Altered mental status, possible metabolic encephalopathy. 2. Delirium. 3. Right upper lobe infiltrate. 4.  Insulin-dependent diabetes with status post nephrectomy. 5. Hypertension. 6. Hyperlipidemia. 7. Coronary artery disease, status post multiple stents. 8. Hypercapnia. 9. Anemia with hemoglobin drifting down to 9.0 possibly partially     dilutional.  PLAN:  To get dementia panel, anemia panel, stools for occult blood. Monitor CBC daily.  Renal function as per Nephrology.  Antibiotics as per Pulmonology and mental status as per Neurology recommendations.     Melvyn Novas, MD     RMD/MEDQ  D:  08/27/2012  T:  08/28/2012  Job:  [id number]

## 2012-08-28 NOTE — Progress Notes (Signed)
Subjective: Interval History: none.  Objective: Vital signs in last 24 hours: Temp:  [98.6 F (37 C)-99.6 F (37.6 C)] 99.1 F (37.3 C) (03/21 0722) Pulse Rate:  [25-125] 91 (03/21 0722) Resp:  [12-22] 16 (03/21 0722) BP: (99-151)/(49-96) 151/90 mmHg (03/21 0722) SpO2:  [92 %-100 %] 96 % (03/21 0722) FiO2 (%):  [25 %] 25 % (03/21 0720) Weight:  [111.4 kg (245 lb 9.5 oz)] 111.4 kg (245 lb 9.5 oz) (03/21 0500) Weight change: -3.5 kg (-7 lb 11.5 oz)  Intake/Output from previous day: 03/20 0701 - 03/21 0700 In: 3145 [I.V.:2875; IV Piggyback:270] Out: 5925 [Urine:5925] Intake/Output this shift:    Patient somnolent but arousable. He doesn't have any complaints. Chest has some expiratory wheezing no rales or rhonchi Heart exam revealed regular rate and rhythm no S3 Abdomen soft positive bowel sound Extremities no edema.  Lab Results:  Recent Labs  08/27/12 0902 08/28/12 0456  WBC 7.2 8.7  HGB 9.0* 10.1*  HCT 27.7* 31.1*  PLT 188 249   BMET:  Recent Labs  08/27/12 0505 08/28/12 0456  NA 135 144  K 3.9 4.5  CL 105 102  CO2 18* 25  GLUCOSE 107* 150*  BUN 57* 60*  CREATININE 3.82* 3.53*  CALCIUM 7.0* 9.6    Recent Labs  08/26/12 1312  PTH 108.5*   Iron Studies: No results found for this basename: IRON, TIBC, TRANSFERRIN, FERRITIN,  in the last 72 hours  Studies/Results: US Renal  08/26/2012  *RADIOLOGY REPORT*  Clinical Data: Renal insufficiency. 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  RENAL/URINARY TRACT ULTRASOUND COMPLETE  Comparison:  CT scan dated 03/09/2012  Findings:  Right Kidney:  Removed.  Left Kidney:  14.0 cm in length.  1 cm cyst on the lateral aspect of the lower pole, unchanged.  No hydronephrosis or renal cortical thinning.  Bladder:  The bladder is empty with a Foley catheter in place.  IMPRESSION: Normal appearing left kidney.  Stable tiny cyst in the lower pole.   Original Report Authenticated By: Francene Boyers, M.D.    Dg Chest  Port 1 View  08/27/2012  *RADIOLOGY REPORT*  Clinical Data: To torus failure  PORTABLE CHEST - 1 VIEW  Comparison: Portable chest x-ray of 08/26/2012  Findings: There is more patchy opacities bilaterally.  Although this could represent asymmetrical edema, pneumonia is a definite consideration.  Cardiomegaly is stable.  No bony abnormality is seen.  IMPRESSION: Some worsening of patchy airspace disease.  Possible asymmetrical edema but consider pneumonia.   Original Report Authenticated By: Dwyane Dee, M.D.     I have reviewed the patient's current medications.  Assessment/Plan: Problem #1 acute kidney injury superimposed on chronic his BUN is 60 creatinine 3.53 renal function seems to be improving. Presently patient is none oliguric. Problem #2 diabetes Problem #3 hypertension his blood pressure seems to be somewhat better Problem #4 anemia Problem #5 metabolic bone disease his phosphorus is mildly elevated in PTH is elevated. Problem #6 history of COPD/sleep apnea Problem #7 history of pneumonia patient on antibiotics presently is afebrile. Problem #8 Mixed acid-base imbalance. CO2 has improved. Her last pH was 7.2 from yesterday. Plan: We'll continue his present management We'll check basic metabolic panel in the morning. Will start patient on PhosLo 6 under6 67 mg  3 times a day with meals and one with snack.   LOS: 7 days   Carnetta Losada S 08/28/2012,7:52 AM

## 2012-08-28 NOTE — Progress Notes (Signed)
Patient ID: [name], male   DOB: January 21, 1941, 72 y.o.   MRN: [id number]  [facility] NEUROLOGY [place] Beirne A. Gerilyn Pilgrim, MD     [email or web address]          [name] is an 72 y.o. male.   Assessment/Plan: Toxin metabolic encephalopathy slightly improving. The etiology is due to multifactorial causes including medication effect, acute renal failure, pneumonia and UTI. We will continue to follow the patient and see how he progresses.  The patient opens his eyes spontaneously. He is still disoriented. He speaks in full sentences but nonsensical and disoriented. He does not follow commands consistently. Pupils are reactive. Extra ocular movements are full. Facial muscle strength and symmetric and his antigravity strength bilaterally.    Objective: Vital signs in last 24 hours: Temp:  [98.9 F (37.2 C)-99.6 F (37.6 C)] 98.9 F (37.2 C) (03/21 2000) Pulse Rate:  [30-141] 141 (03/21 2045) Resp:  [11-24] 24 (03/21 2045) BP: (113-154)/(58-96) 113/81 mmHg (03/21 2000) SpO2:  [86 %-100 %] 94 % (03/21 2045) FiO2 (%):  [25 %] 25 % (03/21 0720) Weight:  [111.4 kg (245 lb 9.5 oz)] 111.4 kg (245 lb 9.5 oz) (03/21 0500)  Intake/Output from previous day: 03/20 0701 - 03/21 0700 In: 3270 [I.V.:3000; IV Piggyback:270] Out: 5925 [Urine:5925] Intake/Output this shift: Total I/O In: 397.5 [I.V.:397.5] Out: -  Nutritional status: NPO   Lab Results: Results for orders placed during the hospital encounter of 08/21/12 (from the past 48 hour(s))  GLUCOSE, CAPILLARY     Status: Abnormal   Collection Time    08/27/12 12:13 AM      Result Value Range   Glucose-Capillary 120 (*) 70 - 99 mg/dL   Comment 1 Documented in Chart     Comment 2 Notify RN    BLOOD GAS, ARTERIAL     Status: Abnormal   Collection Time    08/27/12  3:50 AM      Result Value Range   FIO2 25.00     Delivery systems BILEVEL POSITIVE AIRWAY PRESSURE     Inspiratory PAP 16     Expiratory PAP 8     pH, Arterial 7.230  (*) 7.350 - 7.450   pCO2 arterial 52.1 (*) 35.0 - 45.0 mmHg   pO2, Arterial 68.5 (*) 80.0 - 100.0 mmHg   Bicarbonate 21.1  20.0 - 24.0 mEq/L   TCO2 20.7  0 - 100 mmol/L   Acid-base deficit 5.3 (*) 0.0 - 2.0 mmol/L   O2 Saturation 92.6     Patient temperature 37.0     Collection site RIGHT RADIAL     Drawn by [id number]     Sample type ARTERIAL     Allens test (pass/fail) PASS  PASS  PROTIME-INR     Status: Abnormal   Collection Time    08/27/12  5:05 AM      Result Value Range   Prothrombin Time 28.4 (*) 11.6 - 15.2 seconds   INR 2.84 (*) 0.00 - 1.49  BASIC METABOLIC PANEL     Status: Abnormal   Collection Time    08/27/12  5:05 AM      Result Value Range   Sodium 135  135 - 145 mEq/L   Potassium 3.9  3.5 - 5.1 mEq/L   Comment: DELTA CHECK NOTED   Chloride 105  96 - 112 mEq/L   CO2 18 (*) 19 - 32 mEq/L   Glucose, Bld 107 (*) 70 - 99 mg/dL   BUN 57 (*) 6 -  23 mg/dL   Creatinine, Ser 4.69 (*) 0.50 - 1.35 mg/dL   Calcium 7.0 (*) 8.4 - 10.5 mg/dL   GFR calc non Af Amer 15 (*) >90 mL/min   GFR calc Af Amer 17 (*) >90 mL/min   Comment:            The eGFR has been calculated     using the CKD EPI equation.     This calculation has not been     validated in all clinical     situations.     eGFR's persistently     <90 mL/min signify     possible Chronic Kidney Disease.  HEPATIC FUNCTION PANEL     Status: Abnormal   Collection Time    08/27/12  5:05 AM      Result Value Range   Total Protein 5.2 (*) 6.0 - 8.3 g/dL   Albumin 2.2 (*) 3.5 - 5.2 g/dL   AST 8  0 - 37 U/L   ALT 8  0 - 53 U/L   Alkaline Phosphatase 47  39 - 117 U/L   Total Bilirubin 0.3  0.3 - 1.2 mg/dL   Bilirubin, Direct 0.2  0.0 - 0.3 mg/dL   Indirect Bilirubin 0.1 (*) 0.3 - 0.9 mg/dL  TSH     Status: None   Collection Time    08/27/12  5:05 AM      Result Value Range   TSH 1.989  0.350 - 4.500 uIU/mL  GLUCOSE, CAPILLARY     Status: Abnormal   Collection Time    08/27/12  7:27 AM      Result Value Range    Glucose-Capillary 118 (*) 70 - 99 mg/dL   Comment 1 Documented in Chart     Comment 2 Notify RN    RPR     Status: None   Collection Time    08/27/12  9:02 AM      Result Value Range   RPR NON REACTIVE  NON REACTIVE  VITAMIN B12     Status: None   Collection Time    08/27/12  9:02 AM      Result Value Range   Vitamin B-12 238  211 - 911 pg/mL  SEDIMENTATION RATE     Status: Abnormal   Collection Time    08/27/12  9:02 AM      Result Value Range   Sed Rate 68 (*) 0 - 16 mm/hr  CBC     Status: Abnormal   Collection Time    08/27/12  9:02 AM      Result Value Range   WBC 7.2  4.0 - 10.5 K/uL   RBC 3.68 (*) 4.22 - 5.81 MIL/uL   Hemoglobin 9.0 (*) 13.0 - 17.0 g/dL   Comment: DELTA CHECK NOTED   HCT 27.7 (*) 39.0 - 52.0 %   MCV 75.3 (*) 78.0 - 100.0 fL   MCH 24.5 (*) 26.0 - 34.0 pg   MCHC 32.5  30.0 - 36.0 g/dL   RDW 62.9 (*) 52.8 - 41.3 %   Platelets 188  150 - 400 K/uL   Comment: DELTA CHECK NOTED  TSH     Status: None   Collection Time    08/27/12  9:02 AM      Result Value Range   TSH 1.765  0.350 - 4.500 uIU/mL  FOLATE     Status: None   Collection Time    08/27/12  9:02 AM  Result Value Range   Folate 6.8     Comment: (NOTE)     Reference Ranges            Deficient:       0.4 - 3.3 ng/mL            Indeterminate:   3.4 - 5.4 ng/mL            Normal:              > 5.4 ng/mL  AMMONIA     Status: None   Collection Time    08/27/12  9:02 AM      Result Value Range   Ammonia 36  11 - 60 umol/L  GLUCOSE, CAPILLARY     Status: Abnormal   Collection Time    08/27/12 11:58 AM      Result Value Range   Glucose-Capillary 131 (*) 70 - 99 mg/dL   Comment 1 Documented in Chart     Comment 2 Notify RN    GLUCOSE, CAPILLARY     Status: Abnormal   Collection Time    08/27/12  4:25 PM      Result Value Range   Glucose-Capillary 119 (*) 70 - 99 mg/dL   Comment 1 Documented in Chart    GLUCOSE, CAPILLARY     Status: Abnormal   Collection Time    08/27/12  7:30 PM       Result Value Range   Glucose-Capillary 136 (*) 70 - 99 mg/dL   Comment 1 Documented in Chart     Comment 2 Notify RN    GLUCOSE, CAPILLARY     Status: Abnormal   Collection Time    08/27/12 11:36 PM      Result Value Range   Glucose-Capillary 131 (*) 70 - 99 mg/dL   Comment 1 Documented in Chart     Comment 2 Notify RN    GLUCOSE, CAPILLARY     Status: Abnormal   Collection Time    08/28/12  3:40 AM      Result Value Range   Glucose-Capillary 141 (*) 70 - 99 mg/dL   Comment 1 Documented in Chart     Comment 2 Notify RN    PROTIME-INR     Status: Abnormal   Collection Time    08/28/12  4:56 AM      Result Value Range   Prothrombin Time 26.0 (*) 11.6 - 15.2 seconds   INR 2.52 (*) 0.00 - 1.49  CBC     Status: Abnormal   Collection Time    08/28/12  4:56 AM      Result Value Range   WBC 8.7  4.0 - 10.5 K/uL   RBC 4.11 (*) 4.22 - 5.81 MIL/uL   Hemoglobin 10.1 (*) 13.0 - 17.0 g/dL   HCT 16.1 (*) 09.6 - 04.5 %   MCV 75.7 (*) 78.0 - 100.0 fL   MCH 24.6 (*) 26.0 - 34.0 pg   MCHC 32.5  30.0 - 36.0 g/dL   RDW 40.9 (*) 81.1 - 91.4 %   Platelets 249  150 - 400 K/uL   Comment: DELTA CHECK NOTED  BASIC METABOLIC PANEL     Status: Abnormal   Collection Time    08/28/12  4:56 AM      Result Value Range   Sodium 144  135 - 145 mEq/L   Comment: DELTA CHECK NOTED   Potassium 4.5  3.5 - 5.1 mEq/L   Chloride 102  96 - 112 mEq/L   CO2 25  19 - 32 mEq/L   Glucose, Bld 150 (*) 70 - 99 mg/dL   BUN 60 (*) 6 - 23 mg/dL   Creatinine, Ser 1.61 (*) 0.50 - 1.35 mg/dL   Calcium 9.6  8.4 - 09.6 mg/dL   GFR calc non Af Amer 16 (*) >90 mL/min   GFR calc Af Amer 19 (*) >90 mL/min   Comment:            The eGFR has been calculated     using the CKD EPI equation.     This calculation has not been     validated in all clinical     situations.     eGFR's persistently     <90 mL/min signify     possible Chronic Kidney Disease.  HEPATIC FUNCTION PANEL     Status: Abnormal   Collection Time      08/28/12  4:56 AM      Result Value Range   Total Protein 7.6  6.0 - 8.3 g/dL   Albumin 2.9 (*) 3.5 - 5.2 g/dL   AST 11  0 - 37 U/L   ALT 11  0 - 53 U/L   Alkaline Phosphatase 73  39 - 117 U/L   Total Bilirubin 0.6  0.3 - 1.2 mg/dL   Bilirubin, Direct 0.4 (*) 0.0 - 0.3 mg/dL   Indirect Bilirubin 0.2 (*) 0.3 - 0.9 mg/dL  IRON AND TIBC     Status: Abnormal   Collection Time    08/28/12  4:56 AM      Result Value Range   Iron 10 (*) 42 - 135 ug/dL   TIBC 045  409 - 811 ug/dL   Saturation Ratios 3 (*) 20 - 55 %   UIBC 280  125 - 400 ug/dL  FERRITIN     Status: None   Collection Time    08/28/12  4:56 AM      Result Value Range   Ferritin 70  22 - 322 ng/mL  GLUCOSE, CAPILLARY     Status: Abnormal   Collection Time    08/28/12  7:24 AM      Result Value Range   Glucose-Capillary 154 (*) 70 - 99 mg/dL  GLUCOSE, CAPILLARY     Status: Abnormal   Collection Time    08/28/12 11:18 AM      Result Value Range   Glucose-Capillary 126 (*) 70 - 99 mg/dL  GLUCOSE, CAPILLARY     Status: Abnormal   Collection Time    08/28/12  3:29 PM      Result Value Range   Glucose-Capillary 134 (*) 70 - 99 mg/dL  GLUCOSE, CAPILLARY     Status: Abnormal   Collection Time    08/28/12  7:49 PM      Result Value Range   Glucose-Capillary 145 (*) 70 - 99 mg/dL  BASIC METABOLIC PANEL     Status: Abnormal   Collection Time    08/28/12  7:54 PM      Result Value Range   Sodium 141  135 - 145 mEq/L   Potassium 3.7  3.5 - 5.1 mEq/L   Comment: DELTA CHECK NOTED   Chloride 99  96 - 112 mEq/L   CO2 26  19 - 32 mEq/L   Glucose, Bld 162 (*) 70 - 99 mg/dL   BUN 54 (*) 6 - 23 mg/dL   Creatinine, Ser 9.14 (*) 0.50 - 1.35 mg/dL  Calcium 9.7  8.4 - 10.5 mg/dL   GFR calc non Af Amer 23 (*) >90 mL/min   GFR calc Af Amer 27 (*) >90 mL/min   Comment:            The eGFR has been calculated     using the CKD EPI equation.     This calculation has not been     validated in all clinical     situations.      eGFR's persistently     <90 mL/min signify     possible Chronic Kidney Disease.    Lipid Panel No results found for this basename: CHOL, TRIG, HDL, CHOLHDL, VLDL, LDLCALC,  in the last 72 hours  Studies/Results: Dg Chest Port 1 View  08/28/2012  *RADIOLOGY REPORT*  Clinical Data: 72 year old male with respiratory failure.  PORTABLE CHEST - 1 VIEW  Comparison: 08/27/2012 and earlier.  Findings: Portable semi upright AP view 0645 hours.  Interval regression of multilobar are and bilateral (right greater than left) fluffy pulmonary opacity. Stable cardiomegaly and mediastinal contours.  No large effusion.  No pneumothorax.  No areas of worsening ventilation.  IMPRESSION: Interval regressed but not resolved multilobar bilateral pulmonary opacity.  I favor asymmetric pulmonary edema over pneumonia. Cardiomegaly.   Original Report Authenticated By: Erskine Speed, M.D.    Dg Chest Port 1 View  08/27/2012  *RADIOLOGY REPORT*  Clinical Data: To torus failure  PORTABLE CHEST - 1 VIEW  Comparison: Portable chest x-ray of 08/26/2012  Findings: There is more patchy opacities bilaterally.  Although this could represent asymmetrical edema, pneumonia is a definite consideration.  Cardiomegaly is stable.  No bony abnormality is seen.  IMPRESSION: Some worsening of patchy airspace disease.  Possible asymmetrical edema but consider pneumonia.   Original Report Authenticated By: Dwyane Dee, M.D.     Medications:  Scheduled Meds: . ipratropium  0.5 mg Nebulization Q4H   And  . albuterol  2.5 mg Nebulization Q4H  . aspirin EC  325 mg Oral Daily  . atenolol  50 mg Oral Daily  . calcium acetate  667 mg Oral TID WC  . furosemide  100 mg Intravenous BID  . nitroGLYCERIN  1 inch Topical Daily  . pantoprazole (PROTONIX) IV  40 mg Intravenous Q24H  . PARoxetine  20 mg Oral Daily  . piperacillin-tazobactam (ZOSYN)  IV  3.375 g Intravenous Q8H  . vancomycin  1,000 mg Intravenous Q48H  . Warfarin - Pharmacist Dosing  Inpatient   Does not apply q1800   Continuous Infusions: . sodium chloride 125 mL/hr at 08/28/12 1648  . dextrose 50 mL/hr at 08/25/12 1353  . diltiazem (CARDIZEM) infusion 20 mg/hr (08/28/12 2045)   PRN Meds:.dextrose, metoprolol, nitroGLYCERIN    LOS: 7 days   Trany Chernick A. Gerilyn Pilgrim, M.D.  Diplomate, Biomedical engineer of Psychiatry and Neurology ( Neurology).

## 2012-08-28 NOTE — Plan of Care (Signed)
Problem: Phase I Progression Outcomes Goal: Ventricular heart rate < 120/min Outcome: Not Progressing Pt in afib again. Started on cardizem

## 2012-08-28 NOTE — Progress Notes (Signed)
[name], [name]                ACCOUNT NO.:  [id number]  MEDICAL RECORD NO.:  [id number]  LOCATION:  IC06                          FACILITY:  [facility]  PHYSICIAN:  Melvyn Novas, MDDATE OF BIRTH:  [birth year]  DATE OF PROCEDURE: DATE OF DISCHARGE:                                PROGRESS NOTE   PROBLEMS: 1. Metabolic encephalopathy, multifactorial due to hypercapnia,     aspiration, and/or hospital-acquired pneumonia. 2. Chronic obstructive pulmonary disease. 3. Insulin-dependent diabetes. 4. Hypertension. 5. Coronary artery disease, status post stenting. 6. Hyperlipidemia. 7. Acute on chronic renal failure. 8. Opioid buildup due to renal failure, although he had been     __________ 48 hours prior to the last 48 hours.  PHYSICAL EXAMINATION:  GENERAL:  The patient much more alert today.  He answers questions appropriately. VITAL SIGNS:  Blood pressure 152/91, pulse is 84 and regular sinus rhythm, on BiPAP, temperature 99.6, O2 sat is 100%.  LABORATORY DATA:  WBCs 8.7, hemoglobin 10.1.  Creatinine mildly diminished at 3.8-3.53.  INR 2.52 on Coumadin per pharmacy protocol.  He is status post nephrectomy.  Renal consultation obtained.  Following their recommendations, Pulmonary consultation was obtained, and currently being managed with BiPAP, DuoNeb nebulizer, vanc and Zosyn.  PLAN:  Plan right now is to continue current course of therapy, anticoagulation, monitoring hemodynamics, renal failure, and ambulate the patient out of bed to chair if possible today.     Melvyn Novas, MD     RMD/MEDQ  D:  08/28/2012  T:  08/28/2012  Job:  [id number]

## 2012-08-28 NOTE — Progress Notes (Signed)
ANTICOAGULATION CONSULT NOTE  Pharmacy Consult for Warfarin Indication: Afib  No Known Allergies  Patient Measurements: Height: 5' 7.5" (171.5 cm) Weight: 245 lb 9.5 oz (111.4 kg) IBW/kg (Calculated) : 67.25 Body mass index is 37.88 kg/(m^2).  Vital Signs: Temp: 99.1 F (37.3 C) (03/21 0722) Temp src: Axillary (03/21 0400) BP: 151/90 mmHg (03/21 0722) Pulse Rate: 91 (03/21 0722)  Labs:  Recent Labs  08/26/12 0450 08/27/12 0505 08/27/12 0902 08/28/12 0456  HGB 10.9*  --  9.0* 10.1*  HCT 34.0*  --  27.7* 31.1*  PLT 260  --  188 249  LABPROT 22.4* 28.4*  --  26.0*  INR 2.06* 2.84*  --  2.52*  CREATININE 3.55* 3.82*  --  3.53*   Estimated Creatinine Clearance: 23.7 ml/min (by C-G formula based on Cr of 3.53).  Medical History: Past Medical History  Diagnosis Date  . Coronary artery disease   . Hypertension   . Cancer   . Pneumonia   . CHF (congestive heart failure)   . High cholesterol   . Diabetes mellitus   . Hepatitis   . Renal disorder     kidney cancer  . LBBB (left bundle branch block)   . Paroxysmal atrial fibrillation   . Sleep apnea    Medications:  Scheduled:  . ipratropium  0.5 mg Nebulization Q4H   And  . albuterol  2.5 mg Nebulization Q4H  . aspirin EC  325 mg Oral Daily  . atenolol  50 mg Oral Daily  . diltiazem  120 mg Oral Daily  . furosemide  100 mg Intravenous BID  . nitroGLYCERIN  1 inch Topical Daily  . pantoprazole (PROTONIX) IV  40 mg Intravenous Q24H  . PARoxetine  20 mg Oral Daily  . piperacillin-tazobactam (ZOSYN)  IV  3.375 g Intravenous Q8H  . vancomycin  1,000 mg Intravenous Q48H  . warfarin  1 mg Oral ONCE-1800  . Warfarin - Pharmacist Dosing Inpatient   Does not apply q1800  . [DISCONTINUED] isosorbide mononitrate  60 mg Oral Daily  . [DISCONTINUED] methadone  20 mg Oral BID  . [DISCONTINUED] nitroGLYCERIN  1 inch Topical Q24H  . [DISCONTINUED] pantoprazole  40 mg Oral Daily   Assessment: [name] with new onset Afib  started on warfarin.   His INR is therapeutic.  Warfarin dose not charted as given last night.  No bleeding noted.    Goal of Therapy:  INR 2-3   Plan:  PT/INR daily Warfarin 2.5 mg PO x 1 today Consider decrease aspirin to 81mg  since INR therapeutic on warfarin  Elson Clan 08/28/2012,7:42 AM

## 2012-08-29 LAB — GLUCOSE, CAPILLARY
Glucose-Capillary: 144 mg/dL — ABNORMAL HIGH (ref 70–99)
Glucose-Capillary: 145 mg/dL — ABNORMAL HIGH (ref 70–99)
Glucose-Capillary: 179 mg/dL — ABNORMAL HIGH (ref 70–99)
Glucose-Capillary: 181 mg/dL — ABNORMAL HIGH (ref 70–99)

## 2012-08-29 LAB — HEPATIC FUNCTION PANEL
ALT: 16 U/L (ref 0–53)
AST: 26 U/L (ref 0–37)
Alkaline Phosphatase: 72 U/L (ref 39–117)
Indirect Bilirubin: 0.3 mg/dL (ref 0.3–0.9)
Total Protein: 7.7 g/dL (ref 6.0–8.3)

## 2012-08-29 LAB — BASIC METABOLIC PANEL
CO2: 31 mEq/L (ref 19–32)
Chloride: 98 mEq/L (ref 96–112)
Creatinine, Ser: 2.35 mg/dL — ABNORMAL HIGH (ref 0.50–1.35)
Potassium: 3.5 mEq/L (ref 3.5–5.1)

## 2012-08-29 LAB — CBC
HCT: 31 % — ABNORMAL LOW (ref 39.0–52.0)
MCH: 24.2 pg — ABNORMAL LOW (ref 26.0–34.0)
MCHC: 32.9 g/dL (ref 30.0–36.0)
MCV: 73.5 fL — ABNORMAL LOW (ref 78.0–100.0)
RDW: 16.5 % — ABNORMAL HIGH (ref 11.5–15.5)

## 2012-08-29 LAB — PROTIME-INR: INR: 3.03 — ABNORMAL HIGH (ref 0.00–1.49)

## 2012-08-29 MED ORDER — VANCOMYCIN HCL IN DEXTROSE 1-5 GM/200ML-% IV SOLN
1000.0000 mg | INTRAVENOUS | Status: DC
  Administered 2012-08-29: 1000 mg via INTRAVENOUS
  Filled 2012-08-29 (×3): qty 200

## 2012-08-29 MED ORDER — POLYSACCHARIDE IRON COMPLEX 150 MG PO CAPS
150.0000 mg | ORAL_CAPSULE | Freq: Two times a day (BID) | ORAL | Status: DC
  Administered 2012-08-29 – 2012-09-07 (×15): 150 mg via ORAL
  Filled 2012-08-29 (×15): qty 1

## 2012-08-29 MED ORDER — DILTIAZEM HCL 60 MG PO TABS
60.0000 mg | ORAL_TABLET | Freq: Four times a day (QID) | ORAL | Status: DC
  Administered 2012-08-29 (×2): 60 mg via ORAL
  Filled 2012-08-29 (×2): qty 1

## 2012-08-29 NOTE — Progress Notes (Signed)
SI have reviewed the patient's current medications.ubjective: Interval History: has no complaint of nausea or vomiting. Patient denies any difficulty increasing. His appetite is still poor..  Objective: Vital signs in last 24 hours: Temp:  [98.4 F (36.9 C)-99.1 F (37.3 C)] 98.8 F (37.1 C) (03/22 0800) Pulse Rate:  [25-141] 78 (03/22 0630) Resp:  [11-24] 17 (03/22 0630) BP: (113-175)/(59-94) 147/71 mmHg (03/22 0630) SpO2:  [93 %-100 %] 95 % (03/22 0740) Weight:  [101.1 kg (222 lb 14.2 oz)] 101.1 kg (222 lb 14.2 oz) (03/22 0500) Weight change: -10.3 kg (-22 lb 11.3 oz)  Intake/Output from previous day: 03/21 0701 - 03/22 0700 In: 5191.9 [P.O.:1800; I.V.:3031.9; IV Piggyback:360] Out: 5150 [Urine:5150] Intake/Output this shift:    Generally patient is alert today and is not in any apparent distress. Chest decreased breath sound he does have any rales or rhonchi has some expiratory wheezing. Heart exam regular rate and rhythm no murmur Abdomen soft positive bowel sound Extremities no edema.  Lab Results:  Recent Labs  08/28/12 0456 08/29/12 0502  WBC 8.7 8.7  HGB 10.1* 10.2*  HCT 31.1* 31.0*  PLT 249 290   BMET:  Recent Labs  08/28/12 1954 08/29/12 0502  NA 141 142  K 3.7 3.5  CL 99 98  CO2 26 31  GLUCOSE 162* 147*  BUN 54* 52*  CREATININE 2.63* 2.35*  CALCIUM 9.7 9.8    Recent Labs  08/26/12 1312  PTH 108.5*   Iron Studies:  Recent Labs  08/28/12 0456  IRON 10*  TIBC 290  FERRITIN 70    Studies/Results: Dg Chest Port 1 View  08/28/2012  *RADIOLOGY REPORT*  Clinical Data: 72 year old male with respiratory failure.  PORTABLE CHEST - 1 VIEW  Comparison: 08/27/2012 and earlier.  Findings: Portable semi upright AP view 0645 hours.  Interval regression of multilobar are and bilateral (right greater than left) fluffy pulmonary opacity. Stable cardiomegaly and mediastinal contours.  No large effusion.  No pneumothorax.  No areas of worsening ventilation.   IMPRESSION: Interval regressed but not resolved multilobar bilateral pulmonary opacity.  I favor asymmetric pulmonary edema over pneumonia. Cardiomegaly.   Original Report Authenticated By: Erskine Speed, M.D.     I have reviewed the patient's current medications.  Assessment/Plan: Problem #1 renal failure acute kidney injury superimposed on chronic his BUN is 52 creatinine is 2.35 her function seems to be improving progressively. Problem #2 hypertension his blood pressure is reasonably controlled Problem #3 after fibrillation Problem #4 diabetesr  problem #5 metabolic bone disease his calcium is normal and also his phosphorus. Presently patient is on a binder. Problem #6 coronary artery disease Problem #7 degenerative joint disease Problem #8 anemia his iron saturation and ferritin is low. At this moment seems to be iron deficiency anemia. Plan: DC PhosLo           DC Lasix           Basic metabolic panel in the morning.           Will start patient on Nu-Iron 150 mg by mouth twice a day.  LOS: 8 days   Shahrzad Koble S 08/29/2012,8:08 AM

## 2012-08-29 NOTE — Progress Notes (Signed)
ANTIBIOTIC CONSULT NOTE  Pharmacy Consult for Vancomycin & Zosyn Indication: pneumonia  No Known Allergies  Patient Measurements: Height: 5' 7.5" (171.5 cm) Weight: 222 lb 14.2 oz (101.1 kg) IBW/kg (Calculated) : 67.25  Vital Signs: Temp: 98.8 F (37.1 C) (03/22 0800) Temp src: Axillary (03/22 0800) BP: 154/71 mmHg (03/22 0945) Pulse Rate: 85 (03/22 0945) Intake/Output from previous day: 03/21 0701 - 03/22 0700 In: 5191.9 [P.O.:1800; I.V.:3031.9; IV Piggyback:360] Out: 5150 [Urine:5150] Intake/Output from this shift:    Labs:  Recent Labs  08/26/12 1300  08/27/12 0902 08/28/12 0456 08/28/12 1954 08/29/12 0502  WBC  --   --  7.2 8.7  --  8.7  HGB  --   --  9.0* 10.1*  --  10.2*  PLT  --   --  188 249  --  290  LABCREA 261.80  --   --   --   --   --   CREATININE  --   < >  --  3.53* 2.63* 2.35*  < > = values in this interval not displayed. Estimated Creatinine Clearance: 33.9 ml/min (by C-G formula based on Cr of 2.35). No results found for this basename: VANCOTROUGH, Leodis Binet, VANCORANDOM, GENTTROUGH, GENTPEAK, GENTRANDOM, TOBRATROUGH, TOBRAPEAK, TOBRARND, AMIKACINPEAK, AMIKACINTROU, AMIKACIN,  in the last 72 hours   Microbiology: Recent Results (from the past 720 hour(s))  MRSA PCR SCREENING     Status: None   Collection Time    08/21/12  6:45 PM      Result Value Range Status   MRSA by PCR NEGATIVE  NEGATIVE Final   Comment:            The GeneXpert MRSA Assay (FDA     approved for NASAL specimens     only), is one component of a     comprehensive MRSA colonization     surveillance program. It is not     intended to diagnose MRSA     infection nor to guide or     monitor treatment for     MRSA infections.  URINE CULTURE     Status: None   Collection Time    08/25/12  4:12 PM      Result Value Range Status   Specimen Description URINE, CATHETERIZED   Final   Special Requests NONE   Final   Culture  Setup Time 08/26/2012 03:22   Final   Colony Count  NO GROWTH   Final   Culture NO GROWTH   Final   Report Status 08/26/2012 FINAL   Final  CULTURE, BLOOD (ROUTINE X 2)     Status: None   Collection Time    08/25/12  6:45 PM      Result Value Range Status   Specimen Description BLOOD LEFT ANTECUBITAL   Final   Special Requests BOTTLES DRAWN AEROBIC AND ANAEROBIC 8CC EACH   Final   Culture NO GROWTH 4 DAYS   Final   Report Status PENDING   Incomplete  CULTURE, BLOOD (ROUTINE X 2)     Status: None   Collection Time    08/25/12  6:50 PM      Result Value Range Status   Specimen Description BLOOD LEFT HAND   Final   Special Requests BOTTLES DRAWN AEROBIC AND ANAEROBIC 8CC EACH   Final   Culture NO GROWTH 4 DAYS   Final   Report Status PENDING   Incomplete  URINE CULTURE     Status: None   Collection Time  08/26/12  7:27 AM      Result Value Range Status   Specimen Description URINE, CLEAN CATCH   Final   Special Requests NONE   Final   Culture  Setup Time 08/27/2012 02:59   Final   Colony Count NO GROWTH   Final   Culture NO GROWTH   Final   Report Status 08/28/2012 FINAL   Final    Medical History: Past Medical History  Diagnosis Date  . Coronary artery disease   . Hypertension   . Cancer   . Pneumonia   . CHF (congestive heart failure)   . High cholesterol   . Diabetes mellitus   . Hepatitis   . Renal disorder     kidney cancer  . LBBB (left bundle branch block)   . Paroxysmal atrial fibrillation   . Sleep apnea     Medications:  Scheduled:  . ipratropium  0.5 mg Nebulization Q4H   And  . albuterol  2.5 mg Nebulization Q4H  . aspirin EC  325 mg Oral Daily  . atenolol  50 mg Oral Daily  . calcium acetate  667 mg Oral TID WC  . iron polysaccharides  150 mg Oral BID  . nitroGLYCERIN  1 inch Topical Daily  . pantoprazole (PROTONIX) IV  40 mg Intravenous Q24H  . PARoxetine  20 mg Oral Daily  . piperacillin-tazobactam (ZOSYN)  IV  3.375 g Intravenous Q8H  . vancomycin  1,000 mg Intravenous Q48H  . [COMPLETED]  warfarin  2.5 mg Oral ONCE-1800  . Warfarin - Pharmacist Dosing Inpatient   Does not apply q1800  . [DISCONTINUED] diltiazem  120 mg Oral Daily  . [DISCONTINUED] furosemide  100 mg Intravenous BID   Assessment: [name] who has been hospitalized since 3/14 for Afib.  He declined prior to discharge on 3/18 & was noted to be hypoxic with new infiltrate per CXR.   He is currently on broad spectrum antibiotics for HCAP/aspiration PNA. Acute on chronic renal failure.  Renal function improving, CrCl 33.9 ml/min  Vanc 3/19>> Zosyn 3/19>>   Goal of Therapy:  Vancomycin trough level 15-20 mcg/ml  Plan:  1) Continue Zosyn 3.375gm IV Q8h to be infused over 4hrs 2) Change Vancomycin 1gm IV to every 24 hours 3) Check Vancomycin trough at steady state 4) Monitor renal function and cx data   [name], [name] 08/29/2012,9:50 AM

## 2012-08-29 NOTE — Progress Notes (Signed)
[name], [name]                ACCOUNT NO.:  [id number]  MEDICAL RECORD NO.:  [id number]  LOCATION:  IC06                          FACILITY:  [facility]  PHYSICIAN:  Melvyn Novas, MDDATE OF BIRTH:  [birth year]  DATE OF PROCEDURE: DATE OF DISCHARGE:                                PROGRESS NOTE   The patient is significantly alert and oriented, it has been 24 hours now.  Good glycemic control.  Renal function improving.  Creatinine went from 3.5 to 2.6 to 2.3.  Today, hemoglobin 10.2.  He has right-sided infiltrate, currently on antibiotics with nebulizer therapy.  Lungs showed diminished breath sounds at the bases, scattered rhonchi.  No rales audible.  Heart, regular rhythm.  No S3, S4.  The patient will begin to eat today.  Ambulates with physical therapy, out of bed to chair x3, and will await nephrology course to see continued hopefully improvement of renal function due to prerenal insult.     Melvyn Novas, MD     RMD/MEDQ  D:  08/29/2012  T:  08/29/2012  Job:  [id number]

## 2012-08-29 NOTE — Progress Notes (Signed)
Subjective: And he is much more alert. He says he feels better. His breathing is okay.  Objective: Vital signs in last 24 hours: Temp:  [98.4 F (36.9 C)-99.1 F (37.3 C)] 98.8 F (37.1 C) (03/22 0800) Pulse Rate:  [25-141] 85 (03/22 0945) Resp:  [11-24] 17 (03/22 0630) BP: (113-175)/(59-94) 154/71 mmHg (03/22 0945) SpO2:  [93 %-100 %] 95 % (03/22 0740) Weight:  [101.1 kg (222 lb 14.2 oz)] 101.1 kg (222 lb 14.2 oz) (03/22 0500) Weight change: -10.3 kg (-22 lb 11.3 oz) Last BM Date: 08/23/12  Intake/Output from previous day: 03/21 0701 - 03/22 0700 In: 5191.9 [P.O.:1800; I.V.:3031.9; IV Piggyback:360] Out: 5150 [Urine:5150]  PHYSICAL EXAM General appearance: alert, cooperative and mild distress Resp: rhonchi bilaterally Cardio: irregularly irregular rhythm GI: soft, non-tender; bowel sounds normal; no masses,  no organomegaly Extremities: extremities normal, atraumatic, no cyanosis or edema  Lab Results:    Basic Metabolic Panel:  Recent Labs  16/10/96 1312  08/28/12 1954 08/29/12 0502  NA  --   < > 141 142  K  --   < > 3.7 3.5  CL  --   < > 99 98  CO2  --   < > 26 31  GLUCOSE  --   < > 162* 147*  BUN  --   < > 54* 52*  CREATININE  --   < > 2.63* 2.35*  CALCIUM 8.1*  < > 9.7 9.8  PHOS 5.6*  --   --  3.4  < > = values in this interval not displayed. Liver Function Tests:  Recent Labs  08/28/12 0456 08/29/12 0502  AST 11 26  ALT 11 16  ALKPHOS 73 72  BILITOT 0.6 0.6  PROT 7.6 7.7  ALBUMIN 2.9* 3.0*   No results found for this basename: LIPASE, AMYLASE,  in the last 72 hours  Recent Labs  08/27/12 0902  AMMONIA 36   CBC:  Recent Labs  08/28/12 0456 08/29/12 0502  WBC 8.7 8.7  HGB 10.1* 10.2*  HCT 31.1* 31.0*  MCV 75.7* 73.5*  PLT 249 290   Cardiac Enzymes: No results found for this basename: CKTOTAL, CKMB, CKMBINDEX, TROPONINI,  in the last 72 hours BNP: No results found for this basename: PROBNP,  in the last 72 hours D-Dimer: No  results found for this basename: DDIMER,  in the last 72 hours CBG:  Recent Labs  08/28/12 1118 08/28/12 1529 08/28/12 1949 08/28/12 2347 08/29/12 0353 08/29/12 0733  GLUCAP 126* 134* 145* 135* 172* 139*   Hemoglobin A1C: No results found for this basename: HGBA1C,  in the last 72 hours Fasting Lipid Panel: No results found for this basename: CHOL, HDL, LDLCALC, TRIG, CHOLHDL, LDLDIRECT,  in the last 72 hours Thyroid Function Tests:  Recent Labs  08/27/12 0902  TSH 1.765   Anemia Panel:  Recent Labs  08/27/12 0902 08/28/12 0456  VITAMINB12 238  --   FOLATE 6.8  --   FERRITIN  --  70  TIBC  --  290  IRON  --  10*   Coagulation:  Recent Labs  08/28/12 0456 08/29/12 0502  LABPROT 26.0* 29.8*  INR 2.52* 3.03*   Urine Drug Screen: Drugs of Abuse  No results found for this basename: labopia, cocainscrnur, labbenz, amphetmu, thcu, labbarb    Alcohol Level: No results found for this basename: ETH,  in the last 72 hours Urinalysis: No results found for this basename: COLORURINE, APPERANCEUR, LABSPEC, PHURINE, GLUCOSEU, HGBUR, BILIRUBINUR, KETONESUR, PROTEINUR, UROBILINOGEN,  NITRITE, LEUKOCYTESUR,  in the last 72 hours Misc. Labs:  ABGS  Recent Labs  08/27/12 0350  PHART 7.230*  PO2ART 68.5*  TCO2 20.7  HCO3 21.1   CULTURES Recent Results (from the past 240 hour(s))  MRSA PCR SCREENING     Status: None   Collection Time    08/21/12  6:45 PM      Result Value Range Status   MRSA by PCR NEGATIVE  NEGATIVE Final   Comment:            The GeneXpert MRSA Assay (FDA     approved for NASAL specimens     only), is one component of a     comprehensive MRSA colonization     surveillance program. It is not     intended to diagnose MRSA     infection nor to guide or     monitor treatment for     MRSA infections.  URINE CULTURE     Status: None   Collection Time    08/25/12  4:12 PM      Result Value Range Status   Specimen Description URINE,  CATHETERIZED   Final   Special Requests NONE   Final   Culture  Setup Time 08/26/2012 03:22   Final   Colony Count NO GROWTH   Final   Culture NO GROWTH   Final   Report Status 08/26/2012 FINAL   Final  CULTURE, BLOOD (ROUTINE X 2)     Status: None   Collection Time    08/25/12  6:45 PM      Result Value Range Status   Specimen Description BLOOD LEFT ANTECUBITAL   Final   Special Requests BOTTLES DRAWN AEROBIC AND ANAEROBIC 8CC EACH   Final   Culture NO GROWTH 4 DAYS   Final   Report Status PENDING   Incomplete  CULTURE, BLOOD (ROUTINE X 2)     Status: None   Collection Time    08/25/12  6:50 PM      Result Value Range Status   Specimen Description BLOOD LEFT HAND   Final   Special Requests BOTTLES DRAWN AEROBIC AND ANAEROBIC 8CC EACH   Final   Culture NO GROWTH 4 DAYS   Final   Report Status PENDING   Incomplete  URINE CULTURE     Status: None   Collection Time    08/26/12  7:27 AM      Result Value Range Status   Specimen Description URINE, CLEAN CATCH   Final   Special Requests NONE   Final   Culture  Setup Time 08/27/2012 02:59   Final   Colony Count NO GROWTH   Final   Culture NO GROWTH   Final   Report Status 08/28/2012 FINAL   Final   Studies/Results: Dg Chest Port 1 View  08/28/2012  *RADIOLOGY REPORT*  Clinical Data: 72 year old male with respiratory failure.  PORTABLE CHEST - 1 VIEW  Comparison: 08/27/2012 and earlier.  Findings: Portable semi upright AP view 0645 hours.  Interval regression of multilobar are and bilateral (right greater than left) fluffy pulmonary opacity. Stable cardiomegaly and mediastinal contours.  No large effusion.  No pneumothorax.  No areas of worsening ventilation.  IMPRESSION: Interval regressed but not resolved multilobar bilateral pulmonary opacity.  I favor asymmetric pulmonary edema over pneumonia. Cardiomegaly.   Original Report Authenticated By: Erskine Speed, M.D.     Medications:  Scheduled: . ipratropium  0.5 mg Nebulization Q4H    And  .  albuterol  2.5 mg Nebulization Q4H  . aspirin EC  325 mg Oral Daily  . atenolol  50 mg Oral Daily  . calcium acetate  667 mg Oral TID WC  . iron polysaccharides  150 mg Oral BID  . nitroGLYCERIN  1 inch Topical Daily  . pantoprazole (PROTONIX) IV  40 mg Intravenous Q24H  . PARoxetine  20 mg Oral Daily  . piperacillin-tazobactam (ZOSYN)  IV  3.375 g Intravenous Q8H  . vancomycin  1,000 mg Intravenous Q24H  . Warfarin - Pharmacist Dosing Inpatient   Does not apply q1800   Continuous: . sodium chloride 125 mL/hr at 08/28/12 1648  . dextrose 50 mL/hr at 08/25/12 1353  . diltiazem (CARDIZEM) infusion 2.5 mg/hr (08/29/12 0600)   BJY:NWGNFAOZ, metoprolol, nitroGLYCERIN  Assesment: He had another episode of atrial fib with rapid ventricular response yesterday. He had respiratory failure multi-factorial. He has acute renal failure and that seems to be improving Principal Problem:   CAD (coronary artery disease), native coronary artery Active Problems:   Atrial fibrillation with rapid ventricular response   IDDM (insulin dependent diabetes mellitus)   Hyperlipidemia LDL goal < 70   Hypertension associated with diabetes   Degenerative disc disease, lumbar   Illiterate    Plan: No change in treatments. Overall I think he is better.    LOS: 8 days   Alyona Romack L 08/29/2012, 10:21 AM

## 2012-08-29 NOTE — Progress Notes (Signed)
[id number] 

## 2012-08-29 NOTE — Progress Notes (Signed)
Pt coughed moderate amt of thick reddish, brown sputum. No distress noted.

## 2012-08-29 NOTE — Progress Notes (Signed)
ANTICOAGULATION CONSULT NOTE  Pharmacy Consult for Warfarin Indication: Afib  No Known Allergies  Patient Measurements: Height: 5' 7.5" (171.5 cm) Weight: 222 lb 14.2 oz (101.1 kg) IBW/kg (Calculated) : 67.25 Body mass index is 34.37 kg/(m^2).  Vital Signs: Temp: 98.8 F (37.1 C) (03/22 0800) Temp src: Axillary (03/22 0800) BP: 147/71 mmHg (03/22 0630) Pulse Rate: 78 (03/22 0630)  Labs:  Recent Labs  08/27/12 0505  08/27/12 0902 08/28/12 0456 08/28/12 1954 08/29/12 0502  HGB  --   < > 9.0* 10.1*  --  10.2*  HCT  --   --  27.7* 31.1*  --  31.0*  PLT  --   --  188 249  --  290  LABPROT 28.4*  --   --  26.0*  --  29.8*  INR 2.84*  --   --  2.52*  --  3.03*  CREATININE 3.82*  --   --  3.53* 2.63* 2.35*  < > = values in this interval not displayed. Estimated Creatinine Clearance: 33.9 ml/min (by C-G formula based on Cr of 2.35).  Medical History: Past Medical History  Diagnosis Date  . Coronary artery disease   . Hypertension   . Cancer   . Pneumonia   . CHF (congestive heart failure)   . High cholesterol   . Diabetes mellitus   . Hepatitis   . Renal disorder     kidney cancer  . LBBB (left bundle branch block)   . Paroxysmal atrial fibrillation   . Sleep apnea    Medications:  Scheduled:  . ipratropium  0.5 mg Nebulization Q4H   And  . albuterol  2.5 mg Nebulization Q4H  . aspirin EC  325 mg Oral Daily  . atenolol  50 mg Oral Daily  . calcium acetate  667 mg Oral TID WC  . iron polysaccharides  150 mg Oral BID  . nitroGLYCERIN  1 inch Topical Daily  . pantoprazole (PROTONIX) IV  40 mg Intravenous Q24H  . PARoxetine  20 mg Oral Daily  . piperacillin-tazobactam (ZOSYN)  IV  3.375 g Intravenous Q8H  . vancomycin  1,000 mg Intravenous Q48H  . [COMPLETED] warfarin  2.5 mg Oral ONCE-1800  . Warfarin - Pharmacist Dosing Inpatient   Does not apply q1800  . [DISCONTINUED] diltiazem  120 mg Oral Daily  . [DISCONTINUED] furosemide  100 mg Intravenous BID    Assessment: [name] with new onset Afib started on warfarin.   His INR is above goal (3.03)  Goal of Therapy:  INR 2-3   Plan:  PT/INR daily Hold Coumadin today.   Raquel James, Mehgan Santmyer Bennett 08/29/2012,8:55 AM

## 2012-08-30 DIAGNOSIS — J189 Pneumonia, unspecified organism: Secondary | ICD-10-CM | POA: Diagnosis not present

## 2012-08-30 DIAGNOSIS — J449 Chronic obstructive pulmonary disease, unspecified: Secondary | ICD-10-CM | POA: Diagnosis present

## 2012-08-30 DIAGNOSIS — J9600 Acute respiratory failure, unspecified whether with hypoxia or hypercapnia: Secondary | ICD-10-CM | POA: Diagnosis not present

## 2012-08-30 DIAGNOSIS — J441 Chronic obstructive pulmonary disease with (acute) exacerbation: Secondary | ICD-10-CM | POA: Diagnosis not present

## 2012-08-30 LAB — GLUCOSE, CAPILLARY
Glucose-Capillary: 179 mg/dL — ABNORMAL HIGH (ref 70–99)
Glucose-Capillary: 137 mg/dL — ABNORMAL HIGH (ref 70–99)

## 2012-08-30 LAB — CBC
Hemoglobin: 10.1 g/dL — ABNORMAL LOW (ref 13.0–17.0)
Platelets: 282 10*3/uL (ref 150–400)
RBC: 4.18 MIL/uL — ABNORMAL LOW (ref 4.22–5.81)
WBC: 9.5 10*3/uL (ref 4.0–10.5)

## 2012-08-30 LAB — PROTIME-INR: INR: 2.67 — ABNORMAL HIGH (ref 0.00–1.49)

## 2012-08-30 LAB — BASIC METABOLIC PANEL
CO2: 31 mEq/L (ref 19–32)
Calcium: 9.3 mg/dL (ref 8.4–10.5)
GFR calc non Af Amer: 44 mL/min — ABNORMAL LOW (ref >90)
Potassium: 3.5 mEq/L (ref 3.5–5.1)
Sodium: 139 mEq/L (ref 135–145)

## 2012-08-30 LAB — CULTURE, BLOOD (ROUTINE X 2)
Culture: NO GROWTH
Culture: NO GROWTH

## 2012-08-30 MED ORDER — LORAZEPAM 2 MG/ML IJ SOLN: 1.0000 mg | Freq: Once | INTRAMUSCULAR | Status: DC

## 2012-08-30 MED ORDER — LEVALBUTEROL HCL 0.63 MG/3ML IN NEBU
0.6300 mg | INHALATION_SOLUTION | Freq: Four times a day (QID) | RESPIRATORY_TRACT | Status: DC
  Administered 2012-08-30 – 2012-09-07 (×32): 0.63 mg via RESPIRATORY_TRACT
  Filled 2012-08-30 (×33): qty 3

## 2012-08-30 MED ORDER — LORAZEPAM 2 MG/ML IJ SOLN
INTRAMUSCULAR | Status: AC
  Filled 2012-08-30: qty 1

## 2012-08-30 MED ORDER — WARFARIN - PHARMACIST DOSING INPATIENT
Status: DC
  Administered 2012-09-05 – 2012-09-06 (×2)

## 2012-08-30 MED ORDER — LORAZEPAM 2 MG/ML IJ SOLN
1.0000 mg | INTRAMUSCULAR | Status: DC | PRN
  Administered 2012-08-30 – 2012-08-31 (×4): 1 mg via INTRAVENOUS
  Filled 2012-08-30 (×4): qty 1

## 2012-08-30 MED ORDER — WARFARIN SODIUM 2 MG PO TABS
2.0000 mg | ORAL_TABLET | Freq: Once | ORAL | Status: AC
  Administered 2012-08-30: 2 mg via ORAL
  Filled 2012-08-30: qty 1

## 2012-08-30 MED ORDER — LORAZEPAM 2 MG/ML IJ SOLN
1.0000 mg | Freq: Once | INTRAMUSCULAR | Status: AC
  Administered 2012-08-30: 1 mg via INTRAVENOUS

## 2012-08-30 MED ORDER — PIPERACILLIN-TAZOBACTAM 3.375 G IVPB
3.3750 g | Freq: Three times a day (TID) | INTRAVENOUS | Status: DC
Start: 2012-08-30 — End: 2012-09-07
  Administered 2012-08-30 – 2012-09-07 (×25): 3.375 g via INTRAVENOUS
  Filled 2012-08-30 (×28): qty 50

## 2012-08-30 MED ORDER — VANCOMYCIN HCL 1000 MG IV SOLR
750.0000 mg | Freq: Two times a day (BID) | INTRAVENOUS | Status: DC
  Administered 2012-08-30 – 2012-08-31 (×2): 750 mg via INTRAVENOUS
  Filled 2012-08-30 (×5): qty 750

## 2012-08-30 MED ORDER — SODIUM CHLORIDE 0.9 % IJ SOLN
3.0000 mL | INTRAMUSCULAR | Status: DC | PRN
  Administered 2012-08-30: 3 mL via INTRAVENOUS

## 2012-08-30 MED ORDER — DILTIAZEM HCL 100 MG IV SOLR
5.0000 mg/h | INTRAVENOUS | Status: DC
  Administered 2012-08-30: 15 mg/h via INTRAVENOUS
  Administered 2012-08-30: 10 mg/h via INTRAVENOUS
  Administered 2012-08-30: 15 mg/h via INTRAVENOUS
  Administered 2012-08-31: 10 mg/h via INTRAVENOUS
  Filled 2012-08-30: qty 100

## 2012-08-30 MED ORDER — IPRATROPIUM BROMIDE 0.02 % IN SOLN
0.5000 mg | Freq: Four times a day (QID) | RESPIRATORY_TRACT | Status: DC
  Administered 2012-08-30 – 2012-09-07 (×32): 0.5 mg via RESPIRATORY_TRACT
  Filled 2012-08-30 (×31): qty 2.5

## 2012-08-30 NOTE — Progress Notes (Signed)
[name], [name]                ACCOUNT NO.:  [id number]  MEDICAL RECORD NO.:  [id number]  LOCATION:  IC06                          FACILITY:  [facility]  PHYSICIAN:  Melvyn Novas, MDDATE OF BIRTH:  28-Nov-1940  DATE OF PROCEDURE: DATE OF DISCHARGE:                                PROGRESS NOTE   PROBLEMS: 1. Status post aspiration in the hospital with right left-sided     infiltrate. 2. Delirium. 3. Status post acute renal failure, now resolving. 4. Coronary artery disease, status post multiple stent. 5. Normal systolic function per echo. 6. Hypertension. 7. Hyperlipidemia.  The patient was reported as being somewhat agitated this morning, was given Ativan currently under sedation.  PHYSICAL EXAMINATION:  VITAL SIGNS:  Blood pressure is 136/72, temperature 98.4, pulse 76 and regular, respiratory rate is 11. LUNGS:  Clear to A and P with diminished breath sounds at the bases. HEART:  Regular rhythm.  No S3, S4.  No heaves, thrills, or rubs, currently sinus rhythm. ABDOMEN:  Benign.  WBC is 9.5, hemoglobin 10.1.  Creatinine improving to 1.56, currently on Coumadin.  INR 2.67.  PLAN:  Right now is to limit sedating medicines, ambulate patient out of bed to chair 3 times a day if possible, and monitor renal function and hemodynamics.  Continue antibiotics in the form of vancomycin and Zosyn and nebulizer therapy.     Melvyn Novas, MD     RMD/MEDQ  D:  08/30/2012  T:  08/30/2012  Job:  [id number]

## 2012-08-30 NOTE — Progress Notes (Signed)
ANTIBIOTIC CONSULT NOTE  Pharmacy Consult for Vancomycin & Zosyn Indication: pneumonia  No Known Allergies  Patient Measurements: Height: 5' 7.5" (171.5 cm) Weight: 231 lb 4.2 oz (104.9 kg) IBW/kg (Calculated) : 67.25  Vital Signs: Temp: 98.4 F (36.9 C) (03/23 0800) Temp src: Axillary (03/23 0800) BP: 151/118 mmHg (03/23 0700) Pulse Rate: 140 (03/23 0700) Intake/Output from previous day: 03/22 0701 - 03/23 0700 In: 2628.3 [P.O.:250; I.V.:1978.3; IV Piggyback:400] Out: 2550 [Urine:2550] Intake/Output from this shift:    Labs:  Recent Labs  08/28/12 0456 08/28/12 1954 08/29/12 0502 08/30/12 0510  WBC 8.7  --  8.7 9.5  HGB 10.1*  --  10.2* 10.1*  PLT 249  --  290 282  CREATININE 3.53* 2.63* 2.35* 1.56*   Estimated Creatinine Clearance: 52 ml/min (by C-G formula based on Cr of 1.56). No results found for this basename: VANCOTROUGH, Leodis Binet, VANCORANDOM, GENTTROUGH, GENTPEAK, GENTRANDOM, TOBRATROUGH, TOBRAPEAK, TOBRARND, AMIKACINPEAK, AMIKACINTROU, AMIKACIN,  in the last 72 hours   Microbiology: Recent Results (from the past 720 hour(s))  MRSA PCR SCREENING     Status: None   Collection Time    08/21/12  6:45 PM      Result Value Range Status   MRSA by PCR NEGATIVE  NEGATIVE Final   Comment:            The GeneXpert MRSA Assay (FDA     approved for NASAL specimens     only), is one component of a     comprehensive MRSA colonization     surveillance program. It is not     intended to diagnose MRSA     infection nor to guide or     monitor treatment for     MRSA infections.  URINE CULTURE     Status: None   Collection Time    08/25/12  4:12 PM      Result Value Range Status   Specimen Description URINE, CATHETERIZED   Final   Special Requests NONE   Final   Culture  Setup Time 08/26/2012 03:22   Final   Colony Count NO GROWTH   Final   Culture NO GROWTH   Final   Report Status 08/26/2012 FINAL   Final  CULTURE, BLOOD (ROUTINE X 2)     Status: None   Collection Time    08/25/12  6:45 PM      Result Value Range Status   Specimen Description BLOOD LEFT ANTECUBITAL   Final   Special Requests BOTTLES DRAWN AEROBIC AND ANAEROBIC 8CC EACH   Final   Culture NO GROWTH 5 DAYS   Final   Report Status 08/30/2012 FINAL   Final  CULTURE, BLOOD (ROUTINE X 2)     Status: None   Collection Time    08/25/12  6:50 PM      Result Value Range Status   Specimen Description BLOOD LEFT HAND   Final   Special Requests BOTTLES DRAWN AEROBIC AND ANAEROBIC 8CC EACH   Final   Culture NO GROWTH 5 DAYS   Final   Report Status 08/30/2012 FINAL   Final  URINE CULTURE     Status: None   Collection Time    08/26/12  7:27 AM      Result Value Range Status   Specimen Description URINE, CLEAN CATCH   Final   Special Requests NONE   Final   Culture  Setup Time 08/27/2012 02:59   Final   Colony Count NO GROWTH   Final  Culture NO GROWTH   Final   Report Status 08/28/2012 FINAL   Final    Medical History: Past Medical History  Diagnosis Date  . Coronary artery disease   . Hypertension   . Cancer   . Pneumonia   . CHF (congestive heart failure)   . High cholesterol   . Diabetes mellitus   . Hepatitis   . Renal disorder     kidney cancer  . LBBB (left bundle branch block)   . Paroxysmal atrial fibrillation   . Sleep apnea     Medications:  Scheduled:  . ipratropium  0.5 mg Nebulization Q4H   And  . albuterol  2.5 mg Nebulization Q4H  . aspirin EC  325 mg Oral Daily  . atenolol  50 mg Oral Daily  . iron polysaccharides  150 mg Oral BID  . [COMPLETED] LORazepam  1 mg Intravenous Once  . nitroGLYCERIN  1 inch Topical Daily  . pantoprazole (PROTONIX) IV  40 mg Intravenous Q24H  . PARoxetine  20 mg Oral Daily  . piperacillin-tazobactam (ZOSYN)  IV  3.375 g Intravenous Q8H  . vancomycin  750 mg Intravenous Q12H  . warfarin  2 mg Oral Once  . Warfarin - Pharmacist Dosing Inpatient   Does not apply Q24H  . [DISCONTINUED] calcium acetate  667 mg Oral  TID WC  . [DISCONTINUED] diltiazem  60 mg Oral Q6H  . [DISCONTINUED] LORazepam  1 mg Intravenous Once  . [DISCONTINUED] vancomycin  1,000 mg Intravenous Q24H  . [DISCONTINUED] Warfarin - Pharmacist Dosing Inpatient   Does not apply q1800   Assessment: [name] who has been hospitalized since 3/14 for Afib.  He declined prior to discharge on 3/18 & was noted to be hypoxic with new infiltrate per CXR.   He is currently on broad spectrum antibiotics for HCAP/aspiration PNA. Acute on chronic renal failure.  Renal function continues to improve, CrCl 52 ml/min  Vanc 3/19>> Zosyn 3/19>>   Goal of Therapy:  Vancomycin trough level 15-20 mcg/ml  Plan:  1) Continue Zosyn 3.375gm IV Q8h to be infused over 4hrs 2) Change Vancomycin to 750 mg IV every 12 hours. 3) Check Vancomycin trough at steady state 4) Monitor renal function and cx data   [name], [name] 08/30/2012,9:57 AM

## 2012-08-30 NOTE — Progress Notes (Signed)
ANTICOAGULATION CONSULT NOTE  Pharmacy Consult for Warfarin Indication: Afib  No Known Allergies  Patient Measurements: Height: 5' 7.5" (171.5 cm) Weight: 231 lb 4.2 oz (104.9 kg) IBW/kg (Calculated) : 67.25 Body mass index is 35.67 kg/(m^2).  Vital Signs: Temp: 98.4 F (36.9 C) (03/23 0800) Temp src: Axillary (03/23 0800) BP: 151/118 mmHg (03/23 0700) Pulse Rate: 140 (03/23 0700)  Labs:  Recent Labs  08/28/12 0456 08/28/12 1954 08/29/12 0502 08/30/12 0510  HGB 10.1*  --  10.2* 10.1*  HCT 31.1*  --  31.0* 30.6*  PLT 249  --  290 282  LABPROT 26.0*  --  29.8* 27.1*  INR 2.52*  --  3.03* 2.67*  CREATININE 3.53* 2.63* 2.35* 1.56*   Estimated Creatinine Clearance: 52 ml/min (by C-G formula based on Cr of 1.56).  Medical History: Past Medical History  Diagnosis Date  . Coronary artery disease   . Hypertension   . Cancer   . Pneumonia   . CHF (congestive heart failure)   . High cholesterol   . Diabetes mellitus   . Hepatitis   . Renal disorder     kidney cancer  . LBBB (left bundle branch block)   . Paroxysmal atrial fibrillation   . Sleep apnea    Medications:  Scheduled:  . ipratropium  0.5 mg Nebulization Q4H   And  . albuterol  2.5 mg Nebulization Q4H  . aspirin EC  325 mg Oral Daily  . atenolol  50 mg Oral Daily  . iron polysaccharides  150 mg Oral BID  . LORazepam      . [COMPLETED] LORazepam  1 mg Intravenous Once  . nitroGLYCERIN  1 inch Topical Daily  . pantoprazole (PROTONIX) IV  40 mg Intravenous Q24H  . PARoxetine  20 mg Oral Daily  . piperacillin-tazobactam (ZOSYN)  IV  3.375 g Intravenous Q8H  . vancomycin  1,000 mg Intravenous Q24H  . Warfarin - Pharmacist Dosing Inpatient   Does not apply q1800  . [DISCONTINUED] calcium acetate  667 mg Oral TID WC  . [DISCONTINUED] diltiazem  60 mg Oral Q6H  . [DISCONTINUED] LORazepam  1 mg Intravenous Once  . [DISCONTINUED] vancomycin  1,000 mg Intravenous Q48H   Assessment: [name] with new onset  Afib started on warfarin.   INR therapeutic (Coumadin held yesterday due to INR > 3.0  Goal of Therapy:  INR 2-3   Plan:  Coumadin 2 mg po x 1 dose today PT/INR daily    Raquel James, Leisa Gault Bennett 08/30/2012,9:18 AM

## 2012-08-30 NOTE — Progress Notes (Addendum)
Md notified of pt change in heart rhythm. Afib rvr confirmed with 12 lead EKG. Also reported pt had received metoprolol mg iv and scheduled Cardizem 60 mg. Ordered to restart Cardizem gtt.

## 2012-08-30 NOTE — Progress Notes (Signed)
Subjective: Interval History: has no complaint of nausea or vomiting. Presently patient is feeling better. He denies any difficulty breathing no orthopnea or paroxysmal nocturnal dyspnea..  Objective: Vital signs in last 24 hours: Temp:  [97.7 F (36.5 C)-99.6 F (37.6 C)] 98.4 F (36.9 C) (03/23 0800) Pulse Rate:  [60-151] 140 (03/23 0700) Resp:  [15-26] 25 (03/23 0700) BP: (100-164)/(58-129) 151/118 mmHg (03/23 0700) SpO2:  [87 %-98 %] 92 % (03/23 0700) Weight:  [104.9 kg (231 lb 4.2 oz)] 104.9 kg (231 lb 4.2 oz) (03/23 0500) Weight change: 3.8 kg (8 lb 6 oz)  Intake/Output from previous day: 03/22 0701 - 03/23 0700 In: 2628.3 [P.O.:250; I.V.:1978.3; IV Piggyback:400] Out: 2550 [Urine:2550] Intake/Output this shift:    General appearance: alert, cooperative and no distress Resp: clear to auscultation bilaterally Cardio: regular rate and rhythm, S1, S2 normal, no murmur, click, rub or gallop GI: soft, non-tender; bowel sounds normal; no masses,  no organomegaly Extremities: extremities normal, atraumatic, no cyanosis or edema  Lab Results:  Recent Labs  08/29/12 0502 08/30/12 0510  WBC 8.7 9.5  HGB 10.2* 10.1*  HCT 31.0* 30.6*  PLT 290 282   BMET:  Recent Labs  08/29/12 0502 08/30/12 0510  NA 142 139  K 3.5 3.5  CL 98 98  CO2 31 31  GLUCOSE 147* 158*  BUN 52* 36*  CREATININE 2.35* 1.56*  CALCIUM 9.8 9.3   No results found for this basename: PTH,  in the last 72 hours Iron Studies:  Recent Labs  08/28/12 0456  IRON 10*  TIBC 290  FERRITIN 70    Studies/Results: No results found.  I have reviewed the patient's current medications.  Assessment/Plan: Problem #1 Acute kidney injury. His BUN is 6 creatinine is 1.56 renal function is progressively improving. Problem #2 anemia iron deficiency anemia. Problem #3 hypertension his blood pressure seems to be reasonably controlled Problem #4 metabolic bone disease presently calcium is was in acceptable  range phosphorus is normal. Patient on a binder. Problem #5 after fibrillation Problem #6 degenerative joint Problem #7 coronary artery disease Problem #8 hyperlipidemia. Plan: We'll DC PhosLo We'll decrease his IV fluid to 75 cc per hour We'll continue his other medications and we'll check his basic metabolic panel in the morning.  LOS: 9 days   Thais Silberstein S 08/30/2012,8:33 AM

## 2012-08-30 NOTE — Progress Notes (Signed)
Pt very agitated and confused this morning. Per night shift RN pt had been hallucinating and had been very agitated all night. This am pt climbing out of bed yelling "the truck is coming through the wall" and "get these ants out of my bed." Pt unable to be reoriented and still hallucinating and agitated. MD at bedside. 1mg  of Ativan given IV. Will continue to monitor.

## 2012-08-30 NOTE — Progress Notes (Signed)
Subjective: He is much more agitated and confused this morning. He tried to get out of bed unassisted. Yesterday he became a little more confused but was easily reoriented which is not happening today. No other new complaints are noted.  Objective: Vital signs in last 24 hours: Temp:  [97.7 F (36.5 C)-99.6 F (37.6 C)] 98.4 F (36.9 C) (03/23 0800) Pulse Rate:  [60-151] 140 (03/23 0700) Resp:  [15-26] 25 (03/23 0700) BP: (100-164)/(58-129) 151/118 mmHg (03/23 0700) SpO2:  [87 %-98 %] 92 % (03/23 0700) Weight:  [104.9 kg (231 lb 4.2 oz)] 104.9 kg (231 lb 4.2 oz) (03/23 0500) Weight change: 3.8 kg (8 lb 6 oz) Last BM Date: 08/23/12  Intake/Output from previous day: 03/22 0701 - 03/23 0700 In: 2628.3 [P.O.:250; I.V.:1978.3; IV Piggyback:400] Out: 2550 [Urine:2550]  PHYSICAL EXAM General appearance: alert, moderate distress and Confused and agitated Resp: rhonchi bilaterally Cardio: irregularly irregular rhythm GI: soft, non-tender; bowel sounds normal; no masses,  no organomegaly Extremities: extremities normal, atraumatic, no cyanosis or edema  Lab Results:    Basic Metabolic Panel:  Recent Labs  11/91/47 0502 08/30/12 0510  NA 142 139  K 3.5 3.5  CL 98 98  CO2 31 31  GLUCOSE 147* 158*  BUN 52* 36*  CREATININE 2.35* 1.56*  CALCIUM 9.8 9.3  MG  --  1.6  PHOS 3.4  --    Liver Function Tests:  Recent Labs  08/28/12 0456 08/29/12 0502  AST 11 26  ALT 11 16  ALKPHOS 73 72  BILITOT 0.6 0.6  PROT 7.6 7.7  ALBUMIN 2.9* 3.0*   No results found for this basename: LIPASE, AMYLASE,  in the last 72 hours No results found for this basename: AMMONIA,  in the last 72 hours CBC:  Recent Labs  08/29/12 0502 08/30/12 0510  WBC 8.7 9.5  HGB 10.2* 10.1*  HCT 31.0* 30.6*  MCV 73.5* 73.2*  PLT 290 282   Cardiac Enzymes: No results found for this basename: CKTOTAL, CKMB, CKMBINDEX, TROPONINI,  in the last 72 hours BNP: No results found for this basename: PROBNP,   in the last 72 hours D-Dimer: No results found for this basename: DDIMER,  in the last 72 hours CBG:  Recent Labs  08/29/12 1100 08/29/12 1606 08/29/12 1956 08/29/12 2350 08/30/12 0425 08/30/12 0726  GLUCAP 144* 145* 179* 181* 179* 148*   Hemoglobin A1C: No results found for this basename: HGBA1C,  in the last 72 hours Fasting Lipid Panel: No results found for this basename: CHOL, HDL, LDLCALC, TRIG, CHOLHDL, LDLDIRECT,  in the last 72 hours Thyroid Function Tests: No results found for this basename: TSH, T4TOTAL, FREET4, T3FREE, THYROIDAB,  in the last 72 hours Anemia Panel:  Recent Labs  08/28/12 0456  FERRITIN 70  TIBC 290  IRON 10*   Coagulation:  Recent Labs  08/29/12 0502 08/30/12 0510  LABPROT 29.8* 27.1*  INR 3.03* 2.67*   Urine Drug Screen: Drugs of Abuse  No results found for this basename: labopia, cocainscrnur, labbenz, amphetmu, thcu, labbarb    Alcohol Level: No results found for this basename: ETH,  in the last 72 hours Urinalysis: No results found for this basename: COLORURINE, APPERANCEUR, LABSPEC, PHURINE, GLUCOSEU, HGBUR, BILIRUBINUR, KETONESUR, PROTEINUR, UROBILINOGEN, NITRITE, LEUKOCYTESUR,  in the last 72 hours Misc. Labs:  ABGS No results found for this basename: PHART, PCO2, PO2ART, TCO2, HCO3,  in the last 72 hours CULTURES Recent Results (from the past 240 hour(s))  MRSA PCR SCREENING  Status: None   Collection Time    08/21/12  6:45 PM      Result Value Range Status   MRSA by PCR NEGATIVE  NEGATIVE Final   Comment:            The GeneXpert MRSA Assay (FDA     approved for NASAL specimens     only), is one component of a     comprehensive MRSA colonization     surveillance program. It is not     intended to diagnose MRSA     infection nor to guide or     monitor treatment for     MRSA infections.  URINE CULTURE     Status: None   Collection Time    08/25/12  4:12 PM      Result Value Range Status   Specimen  Description URINE, CATHETERIZED   Final   Special Requests NONE   Final   Culture  Setup Time 08/26/2012 03:22   Final   Colony Count NO GROWTH   Final   Culture NO GROWTH   Final   Report Status 08/26/2012 FINAL   Final  CULTURE, BLOOD (ROUTINE X 2)     Status: None   Collection Time    08/25/12  6:45 PM      Result Value Range Status   Specimen Description BLOOD LEFT ANTECUBITAL   Final   Special Requests BOTTLES DRAWN AEROBIC AND ANAEROBIC 8CC EACH   Final   Culture NO GROWTH 5 DAYS   Final   Report Status 08/30/2012 FINAL   Final  CULTURE, BLOOD (ROUTINE X 2)     Status: None   Collection Time    08/25/12  6:50 PM      Result Value Range Status   Specimen Description BLOOD LEFT HAND   Final   Special Requests BOTTLES DRAWN AEROBIC AND ANAEROBIC 8CC EACH   Final   Culture NO GROWTH 5 DAYS   Final   Report Status 08/30/2012 FINAL   Final  URINE CULTURE     Status: None   Collection Time    08/26/12  7:27 AM      Result Value Range Status   Specimen Description URINE, CLEAN CATCH   Final   Special Requests NONE   Final   Culture  Setup Time 08/27/2012 02:59   Final   Colony Count NO GROWTH   Final   Culture NO GROWTH   Final   Report Status 08/28/2012 FINAL   Final   Studies/Results: No results found.  Medications:  Scheduled: . ipratropium  0.5 mg Nebulization Q4H   And  . albuterol  2.5 mg Nebulization Q4H  . aspirin EC  325 mg Oral Daily  . atenolol  50 mg Oral Daily  . iron polysaccharides  150 mg Oral BID  . nitroGLYCERIN  1 inch Topical Daily  . pantoprazole (PROTONIX) IV  40 mg Intravenous Q24H  . PARoxetine  20 mg Oral Daily  . piperacillin-tazobactam (ZOSYN)  IV  3.375 g Intravenous Q8H  . vancomycin  1,000 mg Intravenous Q24H  . warfarin  2 mg Oral Once  . Warfarin - Pharmacist Dosing Inpatient   Does not apply Q24H   Continuous: . sodium chloride 75 mL/hr at 08/30/12 0904  . dextrose 50 mL/hr at 08/25/12 1353  . diltiazem (CARDIZEM) infusion 15 mg/hr  (08/30/12 0753)   ZOX:WRUEAVWU, LORazepam, metoprolol, nitroGLYCERIN  Assesment: He was admitted with atrial fibrillation with rapid ventricular response. He  became more short of breath and developed some respiratory failure requiring BiPAP which has improved. He has pneumonia probably from aspiration. He developed delirium which had improved but he seems a little worse today. Principal Problem:   CAD (coronary artery disease), native coronary artery Active Problems:   Atrial fibrillation with rapid ventricular response   IDDM (insulin dependent diabetes mellitus)   Hyperlipidemia LDL goal < 70   Hypertension associated with diabetes   Degenerative disc disease, lumbar   Illiterate    Plan: Continue current medications he's going to need Ativan on a when necessary basis and already received one dose    LOS: 9 days   Rise Traeger L 08/30/2012, 9:32 AM

## 2012-08-30 NOTE — Progress Notes (Signed)
[id number] 

## 2012-08-30 NOTE — Progress Notes (Signed)
Nebulizer with Albuterol and Atrovent not given due to tachycardia 143. RN notified, doctor to be notified as well. Patient would benefit from changing treatments to Xopenex 1.25mg  Q6.

## 2012-08-31 ENCOUNTER — Inpatient Hospital Stay (HOSPITAL_COMMUNITY): Payer: Medicare Other

## 2012-08-31 LAB — CBC
MCH: 24.1 pg — ABNORMAL LOW (ref 26.0–34.0)
MCH: 24 pg — ABNORMAL LOW (ref 26.0–34.0)
Platelets: 273 10*3/uL (ref 150–400)
Platelets: 221 10*3/uL (ref 150–400)
RBC: 4.06 MIL/uL — ABNORMAL LOW (ref 4.22–5.81)
RBC: 4.09 MIL/uL — ABNORMAL LOW (ref 4.22–5.81)
WBC: 7.2 10*3/uL (ref 4.0–10.5)
WBC: 6.9 10*3/uL (ref 4.0–10.5)

## 2012-08-31 LAB — SEDIMENTATION RATE: Sed Rate: 86 mm/hr — ABNORMAL HIGH (ref 0–16)

## 2012-08-31 LAB — BASIC METABOLIC PANEL
CO2: 31 mEq/L (ref 19–32)
Calcium: 8.8 mg/dL (ref 8.4–10.5)
Chloride: 102 mEq/L (ref 96–112)
Potassium: 3.6 mEq/L (ref 3.5–5.1)
Sodium: 141 mEq/L (ref 135–145)

## 2012-08-31 LAB — GLUCOSE, CAPILLARY
Glucose-Capillary: 124 mg/dL — ABNORMAL HIGH (ref 70–99)
Glucose-Capillary: 123 mg/dL — ABNORMAL HIGH (ref 70–99)
Glucose-Capillary: 175 mg/dL — ABNORMAL HIGH (ref 70–99)

## 2012-08-31 LAB — PROTIME-INR
INR: 2.5 — ABNORMAL HIGH (ref 0.00–1.49)
Prothrombin Time: 25.8 seconds — ABNORMAL HIGH (ref 11.6–15.2)

## 2012-08-31 LAB — RETICULOCYTES: Retic Count, Absolute: 57.3 10*3/uL (ref 19.0–186.0)

## 2012-08-31 MED ORDER — METOPROLOL TARTRATE 1 MG/ML IV SOLN
INTRAVENOUS | Status: AC
  Filled 2012-08-31: qty 5

## 2012-08-31 MED ORDER — WARFARIN SODIUM 2 MG PO TABS
2.0000 mg | ORAL_TABLET | Freq: Once | ORAL | Status: AC
  Administered 2012-08-31: 2 mg via ORAL
  Filled 2012-08-31: qty 1

## 2012-08-31 MED ORDER — DILTIAZEM HCL ER 60 MG PO CP12: 120.0000 mg | ORAL_CAPSULE | Freq: Two times a day (BID) | ORAL | Status: DC

## 2012-08-31 MED ORDER — VANCOMYCIN HCL IN DEXTROSE 1-5 GM/200ML-% IV SOLN
1000.0000 mg | Freq: Two times a day (BID) | INTRAVENOUS | Status: DC
  Administered 2012-08-31 – 2012-09-07 (×14): 1000 mg via INTRAVENOUS
  Filled 2012-08-31 (×17): qty 200

## 2012-08-31 MED ORDER — DILTIAZEM HCL 60 MG PO TABS
60.0000 mg | ORAL_TABLET | Freq: Four times a day (QID) | ORAL | Status: DC
  Administered 2012-08-31 (×3): 60 mg via ORAL
  Filled 2012-08-31 (×3): qty 1

## 2012-08-31 MED ORDER — METOPROLOL TARTRATE 50 MG PO TABS
50.0000 mg | ORAL_TABLET | Freq: Two times a day (BID) | ORAL | Status: DC
  Administered 2012-08-31 – 2012-09-07 (×14): 50 mg via ORAL
  Filled 2012-08-31 (×14): qty 1

## 2012-08-31 MED ORDER — METOPROLOL TARTRATE 1 MG/ML IV SOLN
2.5000 mg | INTRAVENOUS | Status: DC | PRN
  Administered 2012-08-31: 5 mg via INTRAVENOUS

## 2012-08-31 NOTE — Progress Notes (Signed)
Subjective: Interval History: has no complaint of nausea or vomiting. Patient presently denies any difficulty breathing. He doesn't have any chest pain. Overall.  Objective: Vital signs in last 24 hours: Temp:  [98 F (36.7 C)-99.7 F (37.6 C)] 98.8 F (37.1 C) (03/24 0738) Pulse Rate:  [73-146] 79 (03/24 0615) Resp:  [11-25] 12 (03/24 0615) BP: (94-172)/(54-112) 162/80 mmHg (03/24 0615) SpO2:  [9 %-100 %] 98 % (03/24 0653) Weight:  [104.7 kg (230 lb 13.2 oz)] 104.7 kg (230 lb 13.2 oz) (03/24 0500) Weight change: -0.2 kg (-7.1 oz)  Intake/Output from previous day: 03/23 0701 - 03/24 0700 In: 2609.2 [I.V.:2159.2; IV Piggyback:450] Out: 1500 [Urine:1500] Intake/Output this shift: Total I/O In: 540 [P.O.:540] Out: -   General appearance: alert, cooperative and no distress Resp: clear to auscultation bilaterally Cardio: irregularly irregular rhythm GI: soft, non-tender; bowel sounds normal; no masses,  no organomegaly Extremities: extremities normal, atraumatic, no cyanosis or edema  Lab Results:  Recent Labs  08/31/12 0437 08/31/12 0756  WBC 7.2 6.9  HGB 9.8* 9.8*  HCT 30.7* 31.0*  PLT 273 221   BMET:  Recent Labs  08/30/12 0510 08/31/12 0437  NA 139 141  K 3.5 3.6  CL 98 102  CO2 31 31  GLUCOSE 158* 124*  BUN 36* 27*  CREATININE 1.56* 1.26  CALCIUM 9.3 8.8   No results found for this basename: PTH,  in the last 72 hours Iron Studies: No results found for this basename: IRON, TIBC, TRANSFERRIN, FERRITIN,  in the last 72 hours  Studies/Results: Dg Chest Port 1 View  08/31/2012  *RADIOLOGY REPORT*  Clinical Data: Pneumonia  PORTABLE CHEST - 1 VIEW  Comparison: 08/28/2012  Findings: Cardiac shadow remains mildly enlarged.  Patchy bilateral infiltrative changes are again seen.  Given some technical variations of the films there likely is stable.  Abnormality is seen.  IMPRESSION: Stable bilateral infiltrates.   Original Report Authenticated By: Alcide Clever, M.D.      I have reviewed the patient's current medications.  Assessment/Plan: Problem #1 acute kidney injury his BUN is 27 creatinine is 1.6 renal function progressively improving presently patient is none oliguric. Problem #2 after fibrillation his heart rate is fast. Problem #3 history of COPD Problem #4 history of diabetes. Problem #5 altered mental status. Patient seems to be alert and oriented today. Problem #6 history of hypertension his blood pressure seems to be reasonably controlled Problem #7 anemia iron deficiency is on iron supplement. Problem #8 metabolic bone disease is Phosphorus Is Was in Acceptable Range.  Plan: We'll continue his present management. We will Check again his basic metabolic panel in the morning.   LOS: 10 days   Rupinder Livingston S 08/31/2012,9:57 AM

## 2012-08-31 NOTE — Progress Notes (Signed)
[id number] 

## 2012-08-31 NOTE — Progress Notes (Signed)
Subjective: He remains confused. He aspirated and is being treated for pneumonia. He has been in and out of rapid atrial fibrillation and his back on a diltiazem drip  Objective: Vital signs in last 24 hours: Temp:  [98 F (36.7 C)-99.7 F (37.6 C)] 98.8 F (37.1 C) (03/24 0738) Pulse Rate:  [73-146] 79 (03/24 0615) Resp:  [11-25] 12 (03/24 0615) BP: (94-172)/(22-112) 162/80 mmHg (03/24 0615) SpO2:  [9 %-100 %] 98 % (03/24 0653) Weight:  [104.7 kg (230 lb 13.2 oz)] 104.7 kg (230 lb 13.2 oz) (03/24 0500) Weight change: -0.2 kg (-7.1 oz) Last BM Date: 08/23/12  Intake/Output from previous day: 03/23 0701 - 03/24 0700 In: 2609.2 [I.V.:2159.2; IV Piggyback:450] Out: 1500 [Urine:1500]  PHYSICAL EXAM General appearance: moderate distress Resp: rhonchi bilaterally Cardio: irregularly irregular rhythm GI: soft, non-tender; bowel sounds normal; no masses,  no organomegaly Extremities: extremities normal, atraumatic, no cyanosis or edema  Lab Results:    Basic Metabolic Panel:  Recent Labs  40/98/11 0502 08/30/12 0510 08/31/12 0437  NA 142 139 141  K 3.5 3.5 3.6  CL 98 98 102  CO2 31 31 31   GLUCOSE 147* 158* 124*  BUN 52* 36* 27*  CREATININE 2.35* 1.56* 1.26  CALCIUM 9.8 9.3 8.8  MG  --  1.6  --   PHOS 3.4  --   --    Liver Function Tests:  Recent Labs  08/29/12 0502  AST 26  ALT 16  ALKPHOS 72  BILITOT 0.6  PROT 7.7  ALBUMIN 3.0*   No results found for this basename: LIPASE, AMYLASE,  in the last 72 hours No results found for this basename: AMMONIA,  in the last 72 hours CBC:  Recent Labs  08/30/12 0510 08/31/12 0437  WBC 9.5 7.2  HGB 10.1* 9.8*  HCT 30.6* 30.7*  MCV 73.2* 75.6*  PLT 282 273   Cardiac Enzymes: No results found for this basename: CKTOTAL, CKMB, CKMBINDEX, TROPONINI,  in the last 72 hours BNP: No results found for this basename: PROBNP,  in the last 72 hours D-Dimer: No results found for this basename: DDIMER,  in the last 72  hours CBG:  Recent Labs  08/30/12 1118 08/30/12 1629 08/30/12 2007 08/30/12 2322 08/31/12 0448 08/31/12 0736  GLUCAP 149* 137* 137* 136* 124* 123*   Hemoglobin A1C: No results found for this basename: HGBA1C,  in the last 72 hours Fasting Lipid Panel: No results found for this basename: CHOL, HDL, LDLCALC, TRIG, CHOLHDL, LDLDIRECT,  in the last 72 hours Thyroid Function Tests: No results found for this basename: TSH, T4TOTAL, FREET4, T3FREE, THYROIDAB,  in the last 72 hours Anemia Panel: No results found for this basename: VITAMINB12, FOLATE, FERRITIN, TIBC, IRON, RETICCTPCT,  in the last 72 hours Coagulation:  Recent Labs  08/30/12 0510 08/31/12 0437  LABPROT 27.1* 25.8*  INR 2.67* 2.50*   Urine Drug Screen: Drugs of Abuse  No results found for this basename: labopia, cocainscrnur, labbenz, amphetmu, thcu, labbarb    Alcohol Level: No results found for this basename: ETH,  in the last 72 hours Urinalysis: No results found for this basename: COLORURINE, APPERANCEUR, LABSPEC, PHURINE, GLUCOSEU, HGBUR, BILIRUBINUR, KETONESUR, PROTEINUR, UROBILINOGEN, NITRITE, LEUKOCYTESUR,  in the last 72 hours Misc. Labs:  ABGS No results found for this basename: PHART, PCO2, PO2ART, TCO2, HCO3,  in the last 72 hours CULTURES Recent Results (from the past 240 hour(s))  MRSA PCR SCREENING     Status: None   Collection Time  08/21/12  6:45 PM      Result Value Range Status   MRSA by PCR NEGATIVE  NEGATIVE Final   Comment:            The GeneXpert MRSA Assay (FDA     approved for NASAL specimens     only), is one component of a     comprehensive MRSA colonization     surveillance program. It is not     intended to diagnose MRSA     infection nor to guide or     monitor treatment for     MRSA infections.  URINE CULTURE     Status: None   Collection Time    08/25/12  4:12 PM      Result Value Range Status   Specimen Description URINE, CATHETERIZED   Final   Special Requests  NONE   Final   Culture  Setup Time 08/26/2012 03:22   Final   Colony Count NO GROWTH   Final   Culture NO GROWTH   Final   Report Status 08/26/2012 FINAL   Final  CULTURE, BLOOD (ROUTINE X 2)     Status: None   Collection Time    08/25/12  6:45 PM      Result Value Range Status   Specimen Description BLOOD LEFT ANTECUBITAL   Final   Special Requests BOTTLES DRAWN AEROBIC AND ANAEROBIC 8CC EACH   Final   Culture NO GROWTH 5 DAYS   Final   Report Status 08/30/2012 FINAL   Final  CULTURE, BLOOD (ROUTINE X 2)     Status: None   Collection Time    08/25/12  6:50 PM      Result Value Range Status   Specimen Description BLOOD LEFT HAND   Final   Special Requests BOTTLES DRAWN AEROBIC AND ANAEROBIC 8CC EACH   Final   Culture NO GROWTH 5 DAYS   Final   Report Status 08/30/2012 FINAL   Final  URINE CULTURE     Status: None   Collection Time    08/26/12  7:27 AM      Result Value Range Status   Specimen Description URINE, CLEAN CATCH   Final   Special Requests NONE   Final   Culture  Setup Time 08/27/2012 02:59   Final   Colony Count NO GROWTH   Final   Culture NO GROWTH   Final   Report Status 08/28/2012 FINAL   Final   Studies/Results: Dg Chest Port 1 View  08/31/2012  *RADIOLOGY REPORT*  Clinical Data: Pneumonia  PORTABLE CHEST - 1 VIEW  Comparison: 08/28/2012  Findings: Cardiac shadow remains mildly enlarged.  Patchy bilateral infiltrative changes are again seen.  Given some technical variations of the films there likely is stable.  Abnormality is seen.  IMPRESSION: Stable bilateral infiltrates.   Original Report Authenticated By: Alcide Clever, M.D.     Medications:  Scheduled: . aspirin EC  325 mg Oral Daily  . atenolol  50 mg Oral Daily  . ipratropium  0.5 mg Nebulization Q6H  . iron polysaccharides  150 mg Oral BID  . levalbuterol  0.63 mg Nebulization Q6H  . nitroGLYCERIN  1 inch Topical Daily  . pantoprazole (PROTONIX) IV  40 mg Intravenous Q24H  . PARoxetine  20 mg Oral  Daily  . piperacillin-tazobactam (ZOSYN)  IV  3.375 g Intravenous Q8H  . vancomycin  750 mg Intravenous Q12H  . Warfarin - Pharmacist Dosing Inpatient   Does not apply  Q24H   Continuous: . sodium chloride 75 mL/hr at 08/30/12 1800  . dextrose 50 mL/hr at 08/25/12 1353  . diltiazem (CARDIZEM) infusion 10 mg/hr (08/31/12 0600)   ZOX:WRUEAVWU, LORazepam, metoprolol, nitroGLYCERIN, sodium chloride  Assesment: He was admitted with atrial fibrillation with rapid ventricular response which is been a recurrent problem. He then developed marked confusion acute renal failure acute respiratory failure and pneumonia it is healthcare associated and probably aspiration in nature he is still very confused. He is in and out of rapid atrial fibrillation. He is currently on a IV drip Principal Problem:   CAD (coronary artery disease), native coronary artery Active Problems:   Atrial fibrillation with rapid ventricular response   IDDM (insulin dependent diabetes mellitus)   Hyperlipidemia LDL goal < 70   Hypertension associated with diabetes   Degenerative disc disease, lumbar   Illiterate   Acute respiratory failure   HCAP (healthcare-associated pneumonia)   COPD exacerbation   COPD (chronic obstructive pulmonary disease)    Plan: Continue current medications for his pneumonia. His delirium seems to be waxing and waning    LOS: 10 days   Taneka Espiritu L 08/31/2012, 8:15 AM

## 2012-08-31 NOTE — Progress Notes (Signed)
ANTICOAGULATION CONSULT NOTE  Pharmacy Consult for Warfarin Indication: Afib  No Known Allergies  Patient Measurements: Height: 5' 7.5" (171.5 cm) Weight: 230 lb 13.2 oz (104.7 kg) IBW/kg (Calculated) : 67.25 Body mass index is 35.6 kg/(m^2).  Vital Signs: Temp: 98.8 F (37.1 C) (03/24 0738) Temp src: Axillary (03/24 0738) BP: 162/80 mmHg (03/24 0615) Pulse Rate: 79 (03/24 0615)  Labs:  Recent Labs  08/29/12 0502 08/30/12 0510 08/31/12 0437 08/31/12 0756  HGB 10.2* 10.1* 9.8* 9.8*  HCT 31.0* 30.6* 30.7* 31.0*  PLT 290 282 273 221  LABPROT 29.8* 27.1* 25.8*  --   INR 3.03* 2.67* 2.50*  --   CREATININE 2.35* 1.56* 1.26  --    Estimated Creatinine Clearance: 64.4 ml/min (by C-G formula based on Cr of 1.26).  Medical History: Past Medical History  Diagnosis Date  . Coronary artery disease   . Hypertension   . Cancer   . Pneumonia   . CHF (congestive heart failure)   . High cholesterol   . Diabetes mellitus   . Hepatitis   . Renal disorder     kidney cancer  . LBBB (left bundle branch block)   . Paroxysmal atrial fibrillation   . Sleep apnea    Medications:  Scheduled:  . aspirin EC  325 mg Oral Daily  . atenolol  50 mg Oral Daily  . ipratropium  0.5 mg Nebulization Q6H  . iron polysaccharides  150 mg Oral BID  . levalbuterol  0.63 mg Nebulization Q6H  . [COMPLETED] LORazepam  1 mg Intravenous Once  . nitroGLYCERIN  1 inch Topical Daily  . pantoprazole (PROTONIX) IV  40 mg Intravenous Q24H  . PARoxetine  20 mg Oral Daily  . piperacillin-tazobactam (ZOSYN)  IV  3.375 g Intravenous Q8H  . vancomycin  750 mg Intravenous Q12H  . [COMPLETED] warfarin  2 mg Oral Once  . Warfarin - Pharmacist Dosing Inpatient   Does not apply Q24H  . [DISCONTINUED] albuterol  2.5 mg Nebulization Q4H  . [DISCONTINUED] ipratropium  0.5 mg Nebulization Q4H  . [DISCONTINUED] piperacillin-tazobactam (ZOSYN)  IV  3.375 g Intravenous Q8H  . [DISCONTINUED] vancomycin  1,000 mg  Intravenous Q24H  . [DISCONTINUED] Warfarin - Pharmacist Dosing Inpatient   Does not apply q1800   Assessment: [name] with new onset Afib started on warfarin.   INR therapeutic today.    Goal of Therapy:  INR 2-3   Plan:  Coumadin 2 mg po x 1 dose today PT/INR daily  Valrie Hart A 08/31/2012,9:10 AM

## 2012-08-31 NOTE — Progress Notes (Signed)
Patient ID: [name], male   DOB: 03-01-1941, 72 y.o.   MRN: [id number]  [facility] NEUROLOGY Phallon Haydu A. Gerilyn Pilgrim, MD     [email or web address]          [name] is an 72 y.o. male.   Assessment/Plan: Resolving toxic metabolic encephalopathy. We will sign off for now. Reconsult as needed.  The patient is awake and alert. He is feeding himself. He knows that he is in the hospital at [facility]. He relates a year to be 2013. He is talking more lucidly and apparently today. He follows commands well. Pupils are reactive and facial muscles are symmetric. He moves both sides well.    Objective: Vital signs in last 24 hours: Temp:  [98 F (36.7 C)-99.7 F (37.6 C)] 98.8 F (37.1 C) (03/24 0738) Pulse Rate:  [73-146] 79 (03/24 0615) Resp:  [11-25] 12 (03/24 0615) BP: (94-172)/(22-112) 162/80 mmHg (03/24 0615) SpO2:  [9 %-100 %] 98 % (03/24 0653) Weight:  [104.7 kg (230 lb 13.2 oz)] 104.7 kg (230 lb 13.2 oz) (03/24 0500)  Intake/Output from previous day: 03/23 0701 - 03/24 0700 In: 2609.2 [I.V.:2159.2; IV Piggyback:450] Out: 1500 [Urine:1500] Intake/Output this shift:   Nutritional status: Carb Control   Lab Results: Results for orders placed during the hospital encounter of 08/21/12 (from the past 48 hour(s))  GLUCOSE, CAPILLARY     Status: Abnormal   Collection Time    08/29/12 11:00 AM      Result Value Range   Glucose-Capillary 144 (*) 70 - 99 mg/dL  GLUCOSE, CAPILLARY     Status: Abnormal   Collection Time    08/29/12  4:06 PM      Result Value Range   Glucose-Capillary 145 (*) 70 - 99 mg/dL  GLUCOSE, CAPILLARY     Status: Abnormal   Collection Time    08/29/12  7:56 PM      Result Value Range   Glucose-Capillary 179 (*) 70 - 99 mg/dL   Comment 1 Notify RN    GLUCOSE, CAPILLARY     Status: Abnormal   Collection Time    08/29/12 11:50 PM      Result Value Range   Glucose-Capillary 181 (*) 70 - 99 mg/dL   Comment 1 Notify RN    GLUCOSE,  CAPILLARY     Status: Abnormal   Collection Time    08/30/12  4:25 AM      Result Value Range   Glucose-Capillary 179 (*) 70 - 99 mg/dL   Comment 1 Notify RN    PROTIME-INR     Status: Abnormal   Collection Time    08/30/12  5:10 AM      Result Value Range   Prothrombin Time 27.1 (*) 11.6 - 15.2 seconds   INR 2.67 (*) 0.00 - 1.49  BASIC METABOLIC PANEL     Status: Abnormal   Collection Time    08/30/12  5:10 AM      Result Value Range   Sodium 139  135 - 145 mEq/L   Potassium 3.5  3.5 - 5.1 mEq/L   Chloride 98  96 - 112 mEq/L   CO2 31  19 - 32 mEq/L   Glucose, Bld 158 (*) 70 - 99 mg/dL   BUN 36 (*) 6 - 23 mg/dL   Creatinine, Ser 4.69 (*) 0.50 - 1.35 mg/dL   Comment: DELTA CHECK NOTED   Calcium 9.3  8.4 - 10.5 mg/dL   GFR calc non Af Denyse Dago  44 (*) >90 mL/min   GFR calc Af Amer 51 (*) >90 mL/min   Comment:            The eGFR has been calculated     using the CKD EPI equation.     This calculation has not been     validated in all clinical     situations.     eGFR's persistently     <90 mL/min signify     possible Chronic Kidney Disease.  CBC     Status: Abnormal   Collection Time    08/30/12  5:10 AM      Result Value Range   WBC 9.5  4.0 - 10.5 K/uL   RBC 4.18 (*) 4.22 - 5.81 MIL/uL   Hemoglobin 10.1 (*) 13.0 - 17.0 g/dL   HCT 16.1 (*) 09.6 - 04.5 %   MCV 73.2 (*) 78.0 - 100.0 fL   MCH 24.2 (*) 26.0 - 34.0 pg   MCHC 33.0  30.0 - 36.0 g/dL   RDW 40.9 (*) 81.1 - 91.4 %   Platelets 282  150 - 400 K/uL  MAGNESIUM     Status: None   Collection Time    08/30/12  5:10 AM      Result Value Range   Magnesium 1.6  1.5 - 2.5 mg/dL  GLUCOSE, CAPILLARY     Status: Abnormal   Collection Time    08/30/12  7:26 AM      Result Value Range   Glucose-Capillary 148 (*) 70 - 99 mg/dL  GLUCOSE, CAPILLARY     Status: Abnormal   Collection Time    08/30/12 11:18 AM      Result Value Range   Glucose-Capillary 149 (*) 70 - 99 mg/dL  GLUCOSE, CAPILLARY     Status: Abnormal    Collection Time    08/30/12  4:29 PM      Result Value Range   Glucose-Capillary 137 (*) 70 - 99 mg/dL   Comment 1 Notify RN    GLUCOSE, CAPILLARY     Status: Abnormal   Collection Time    08/30/12  8:07 PM      Result Value Range   Glucose-Capillary 137 (*) 70 - 99 mg/dL   Comment 1 Notify RN    GLUCOSE, CAPILLARY     Status: Abnormal   Collection Time    08/30/12 11:22 PM      Result Value Range   Glucose-Capillary 136 (*) 70 - 99 mg/dL   Comment 1 Notify RN    PROTIME-INR     Status: Abnormal   Collection Time    08/31/12  4:37 AM      Result Value Range   Prothrombin Time 25.8 (*) 11.6 - 15.2 seconds   INR 2.50 (*) 0.00 - 1.49  CBC     Status: Abnormal   Collection Time    08/31/12  4:37 AM      Result Value Range   WBC 7.2  4.0 - 10.5 K/uL   RBC 4.06 (*) 4.22 - 5.81 MIL/uL   Hemoglobin 9.8 (*) 13.0 - 17.0 g/dL   HCT 78.2 (*) 95.6 - 21.3 %   MCV 75.6 (*) 78.0 - 100.0 fL   MCH 24.1 (*) 26.0 - 34.0 pg   MCHC 31.9  30.0 - 36.0 g/dL   RDW 08.6 (*) 57.8 - 46.9 %   Platelets 273  150 - 400 K/uL  BASIC METABOLIC PANEL     Status: Abnormal  Collection Time    08/31/12  4:37 AM      Result Value Range   Sodium 141  135 - 145 mEq/L   Potassium 3.6  3.5 - 5.1 mEq/L   Chloride 102  96 - 112 mEq/L   CO2 31  19 - 32 mEq/L   Glucose, Bld 124 (*) 70 - 99 mg/dL   BUN 27 (*) 6 - 23 mg/dL   Creatinine, Ser 1.61  0.50 - 1.35 mg/dL   Calcium 8.8  8.4 - 09.6 mg/dL   GFR calc non Af Amer 57 (*) >90 mL/min   GFR calc Af Amer 65 (*) >90 mL/min   Comment:            The eGFR has been calculated     using the CKD EPI equation.     This calculation has not been     validated in all clinical     situations.     eGFR's persistently     <90 mL/min signify     possible Chronic Kidney Disease.  GLUCOSE, CAPILLARY     Status: Abnormal   Collection Time    08/31/12  4:48 AM      Result Value Range   Glucose-Capillary 124 (*) 70 - 99 mg/dL   Comment 1 Notify RN    GLUCOSE, CAPILLARY      Status: Abnormal   Collection Time    08/31/12  7:36 AM      Result Value Range   Glucose-Capillary 123 (*) 70 - 99 mg/dL   Comment 1 Documented in Chart     Comment 2 Notify RN    RETICULOCYTES     Status: Abnormal   Collection Time    08/31/12  7:56 AM      Result Value Range   Retic Ct Pct 1.4  0.4 - 3.1 %   RBC. 4.09 (*) 4.22 - 5.81 MIL/uL   Retic Count, Manual 57.3  19.0 - 186.0 K/uL  CBC     Status: Abnormal   Collection Time    08/31/12  7:56 AM      Result Value Range   WBC 6.9  4.0 - 10.5 K/uL   RBC 4.09 (*) 4.22 - 5.81 MIL/uL   Hemoglobin 9.8 (*) 13.0 - 17.0 g/dL   HCT 04.5 (*) 40.9 - 81.1 %   MCV 75.8 (*) 78.0 - 100.0 fL   MCH 24.0 (*) 26.0 - 34.0 pg   MCHC 31.6  30.0 - 36.0 g/dL   RDW 91.4 (*) 78.2 - 95.6 %   Platelets 221  150 - 400 K/uL    Lipid Panel No results found for this basename: CHOL, TRIG, HDL, CHOLHDL, VLDL, LDLCALC,  in the last 72 hours  Studies/Results: Dg Chest Port 1 View  08/31/2012  *RADIOLOGY REPORT*  Clinical Data: Pneumonia  PORTABLE CHEST - 1 VIEW  Comparison: 08/28/2012  Findings: Cardiac shadow remains mildly enlarged.  Patchy bilateral infiltrative changes are again seen.  Given some technical variations of the films there likely is stable.  Abnormality is seen.  IMPRESSION: Stable bilateral infiltrates.   Original Report Authenticated By: Alcide Clever, M.D.     Medications:  Scheduled Meds: . aspirin EC  325 mg Oral Daily  . atenolol  50 mg Oral Daily  . ipratropium  0.5 mg Nebulization Q6H  . iron polysaccharides  150 mg Oral BID  . levalbuterol  0.63 mg Nebulization Q6H  . nitroGLYCERIN  1 inch Topical Daily  .  pantoprazole (PROTONIX) IV  40 mg Intravenous Q24H  . PARoxetine  20 mg Oral Daily  . piperacillin-tazobactam (ZOSYN)  IV  3.375 g Intravenous Q8H  . vancomycin  750 mg Intravenous Q12H  . Warfarin - Pharmacist Dosing Inpatient   Does not apply Q24H   Continuous Infusions: . sodium chloride 75 mL/hr at 08/30/12  1800  . dextrose 50 mL/hr at 08/25/12 1353  . diltiazem (CARDIZEM) infusion 10 mg/hr (08/31/12 0600)   PRN Meds:.dextrose, LORazepam, metoprolol, nitroGLYCERIN, sodium chloride    LOS: 10 days   Diamone Whistler A. Gerilyn Pilgrim, M.D.  Diplomate, Biomedical engineer of Psychiatry and Neurology ( Neurology).

## 2012-08-31 NOTE — Progress Notes (Signed)
ANTIBIOTIC CONSULT NOTE  Pharmacy Consult for Vancomycin & Zosyn Indication: pneumonia  No Known Allergies  Patient Measurements: Height: 5' 7.5" (171.5 cm) Weight: 230 lb 13.2 oz (104.7 kg) IBW/kg (Calculated) : 67.25  Vital Signs: Temp: 98.8 F (37.1 C) (03/24 0738) Temp src: Axillary (03/24 0738) BP: 154/86 mmHg (03/24 1007) Pulse Rate: 144 (03/24 1007) Intake/Output from previous day: 03/23 0701 - 03/24 0700 In: 2694.2 [I.V.:2244.2; IV Piggyback:450] Out: 1500 [Urine:1500] Intake/Output from this shift: Total I/O In: 847.2 [P.O.:540; I.V.:257.2; IV Piggyback:50] Out: -   Labs:  Recent Labs  08/29/12 0502 08/30/12 0510 08/31/12 0437 08/31/12 0756  WBC 8.7 9.5 7.2 6.9  HGB 10.2* 10.1* 9.8* 9.8*  PLT 290 282 273 221  CREATININE 2.35* 1.56* 1.26  --    Estimated Creatinine Clearance: 64.4 ml/min (by C-G formula based on Cr of 1.26). No results found for this basename: VANCOTROUGH, Leodis Binet, VANCORANDOM, GENTTROUGH, GENTPEAK, GENTRANDOM, TOBRATROUGH, TOBRAPEAK, TOBRARND, AMIKACINPEAK, AMIKACINTROU, AMIKACIN,  in the last 72 hours   Microbiology: Recent Results (from the past 720 hour(s))  MRSA PCR SCREENING     Status: None   Collection Time    08/21/12  6:45 PM      Result Value Range Status   MRSA by PCR NEGATIVE  NEGATIVE Final   Comment:            The GeneXpert MRSA Assay (FDA     approved for NASAL specimens     only), is one component of a     comprehensive MRSA colonization     surveillance program. It is not     intended to diagnose MRSA     infection nor to guide or     monitor treatment for     MRSA infections.  URINE CULTURE     Status: None   Collection Time    08/25/12  4:12 PM      Result Value Range Status   Specimen Description URINE, CATHETERIZED   Final   Special Requests NONE   Final   Culture  Setup Time 08/26/2012 03:22   Final   Colony Count NO GROWTH   Final   Culture NO GROWTH   Final   Report Status 08/26/2012 FINAL    Final  CULTURE, BLOOD (ROUTINE X 2)     Status: None   Collection Time    08/25/12  6:45 PM      Result Value Range Status   Specimen Description BLOOD LEFT ANTECUBITAL   Final   Special Requests BOTTLES DRAWN AEROBIC AND ANAEROBIC 8CC EACH   Final   Culture NO GROWTH 5 DAYS   Final   Report Status 08/30/2012 FINAL   Final  CULTURE, BLOOD (ROUTINE X 2)     Status: None   Collection Time    08/25/12  6:50 PM      Result Value Range Status   Specimen Description BLOOD LEFT HAND   Final   Special Requests BOTTLES DRAWN AEROBIC AND ANAEROBIC 8CC EACH   Final   Culture NO GROWTH 5 DAYS   Final   Report Status 08/30/2012 FINAL   Final  URINE CULTURE     Status: None   Collection Time    08/26/12  7:27 AM      Result Value Range Status   Specimen Description URINE, CLEAN CATCH   Final   Special Requests NONE   Final   Culture  Setup Time 08/27/2012 02:59   Final   Colony Count NO  GROWTH   Final   Culture NO GROWTH   Final   Report Status 08/28/2012 FINAL   Final   Medical History: Past Medical History  Diagnosis Date  . Coronary artery disease   . Hypertension   . Cancer   . Pneumonia   . CHF (congestive heart failure)   . High cholesterol   . Diabetes mellitus   . Hepatitis   . Renal disorder     kidney cancer  . LBBB (left bundle branch block)   . Paroxysmal atrial fibrillation   . Sleep apnea    Medications:  Scheduled:  . aspirin EC  325 mg Oral Daily  . atenolol  50 mg Oral Daily  . ipratropium  0.5 mg Nebulization Q6H  . iron polysaccharides  150 mg Oral BID  . levalbuterol  0.63 mg Nebulization Q6H  . nitroGLYCERIN  1 inch Topical Daily  . pantoprazole (PROTONIX) IV  40 mg Intravenous Q24H  . PARoxetine  20 mg Oral Daily  . piperacillin-tazobactam (ZOSYN)  IV  3.375 g Intravenous Q8H  . vancomycin  1,000 mg Intravenous Q12H  . [COMPLETED] warfarin  2 mg Oral Once  . warfarin  2 mg Oral Once  . Warfarin - Pharmacist Dosing Inpatient   Does not apply Q24H  .  [DISCONTINUED] vancomycin  750 mg Intravenous Q12H   Assessment: [name] who has been hospitalized since 3/14 for Afib.  He declined prior to discharge on 3/18 & was noted to be hypoxic with new infiltrate per CXR.  He is currently on broad spectrum antibiotics for HCAP/aspiration PNA.  Acute on chronic renal failure.  Renal function continues to improve.  Estimated Creatinine Clearance: 64.4 ml/min (by C-G formula based on Cr of 1.26).  Vanc 3/19>> Zosyn 3/19>>   Goal of Therapy:  Vancomycin trough level 15-20 mcg/ml  Plan:  1) Continue Zosyn 3.375gm IV Q8h to be infused over 4hrs 2) Change Vancomycin to 1000mg  IV every 12 hours. 3) Check Vancomycin trough at steady state 4) Monitor renal function and cx data   Valrie Hart A 08/31/2012,11:22 AM

## 2012-08-31 NOTE — Progress Notes (Signed)
[name], [name]                ACCOUNT NO.:  [id number]  MEDICAL RECORD NO.:  [id number]  LOCATION:  IC06                          FACILITY:  [facility]  PHYSICIAN:  Melvyn Novas, MDDATE OF BIRTH:  Aug 02, 1940  DATE OF PROCEDURE: DATE OF DISCHARGE:                                PROGRESS NOTE   The patient was apparently reverted to AFib yesterday, currently in sinus tachycardia at a rate of 140 with left bundle per EKG.  2D echo reveals a presumed normal systolic function based on echo a year ago under suboptimal study done on this admission.  The patient denies chest pain, orthopnea, PND, palpitations or syncope.  Hemoglobin stable at 9.8.  Awaiting iron studies, creatinine improved to 1.26, currently anticoagulated on Coumadin for paroxysmal AFib, and INR 2.5.  PHYSICAL EXAMINATION:  VITAL SIGNS:  Blood pressure 154/86, pulse 140 and regular.  His temperature 98.8, respiratory rate is 23, O2 sat 91%. Lungs:  Clear.  Diminished breath sounds at bases.  No rales, wheeze, or rhonchi. HEART:  Regular rhythm. No S3 or S4.  No heaves, thrills, or rubs.  PLAN:  Right now is to add Cardizem CD 120 p.o. q.12, continue atenolol 50 p.o. daily, push IV Lopressor for sinus tachycardia or if he reverted to AFib.  5 mg IV push slowly to control heart rate for less than 110 beats per minute.     Melvyn Novas, MD     RMD/MEDQ  D:  08/31/2012  T:  08/31/2012  Job:  [id number]

## 2012-09-01 ENCOUNTER — Inpatient Hospital Stay (HOSPITAL_COMMUNITY): Payer: Medicare Other

## 2012-09-01 ENCOUNTER — Encounter (HOSPITAL_COMMUNITY): Payer: Medicare Other

## 2012-09-01 LAB — BLOOD GAS, ARTERIAL
Drawn by: 21694
Patient temperature: 37
pH, Arterial: 7.353 (ref 7.350–7.450)

## 2012-09-01 LAB — VITAMIN B12: Vitamin B-12: 235 pg/mL (ref 211–911)

## 2012-09-01 LAB — RPR: RPR Ser Ql: NONREACTIVE

## 2012-09-01 LAB — BASIC METABOLIC PANEL
Calcium: 8.8 mg/dL (ref 8.4–10.5)
Chloride: 101 mEq/L (ref 96–112)
Creatinine, Ser: 1.3 mg/dL (ref 0.50–1.35)
GFR calc Af Amer: 63 mL/min — ABNORMAL LOW (ref >90)
Sodium: 137 mEq/L (ref 135–145)

## 2012-09-01 LAB — CBC
HCT: 26 % — ABNORMAL LOW (ref 39.0–52.0)
Hemoglobin: 8.4 g/dL — ABNORMAL LOW (ref 13.0–17.0)
WBC: 5.9 10*3/uL (ref 4.0–10.5)

## 2012-09-01 LAB — MAGNESIUM: Magnesium: 1.8 mg/dL (ref 1.5–2.5)

## 2012-09-01 LAB — DIFFERENTIAL
Basophils Absolute: 0.1 10*3/uL (ref 0.0–0.1)
Lymphocytes Relative: 19 % (ref 12–46)
Monocytes Absolute: 0.5 10*3/uL (ref 0.1–1.0)
Neutro Abs: 3.8 10*3/uL (ref 1.7–7.7)

## 2012-09-01 LAB — TSH: TSH: 1.644 u[IU]/mL (ref 0.350–4.500)

## 2012-09-01 LAB — IRON AND TIBC: Iron: 13 ug/dL — ABNORMAL LOW (ref 42–135)

## 2012-09-01 LAB — PROTIME-INR
INR: 2.13 — ABNORMAL HIGH (ref 0.00–1.49)
Prothrombin Time: 22.9 seconds — ABNORMAL HIGH (ref 11.6–15.2)

## 2012-09-01 LAB — GLUCOSE, CAPILLARY

## 2012-09-01 LAB — FOLATE: Folate: 7.2 ng/mL

## 2012-09-01 LAB — FERRITIN: Ferritin: 63 ng/mL (ref 22–322)

## 2012-09-01 MED ORDER — WARFARIN SODIUM 2 MG PO TABS
4.0000 mg | ORAL_TABLET | Freq: Once | ORAL | Status: AC
  Administered 2012-09-01: 4 mg via ORAL
  Filled 2012-09-01: qty 2

## 2012-09-01 MED ORDER — DILTIAZEM HCL 100 MG IV SOLR: 5.0000 mg/h | INTRAVENOUS | Status: DC

## 2012-09-01 MED ORDER — DILTIAZEM HCL ER 60 MG PO CP12
120.0000 mg | ORAL_CAPSULE | Freq: Two times a day (BID) | ORAL | Status: DC
  Administered 2012-09-01 – 2012-09-07 (×13): 120 mg via ORAL
  Filled 2012-09-01 (×17): qty 2

## 2012-09-01 MED ORDER — BISACODYL 10 MG RE SUPP: 10.0000 mg | Freq: Every day | RECTAL | Status: DC | PRN

## 2012-09-01 MED ORDER — BISACODYL 5 MG PO TBEC
5.0000 mg | DELAYED_RELEASE_TABLET | Freq: Every day | ORAL | Status: DC | PRN
  Administered 2012-09-01 – 2012-09-06 (×3): 5 mg via ORAL
  Filled 2012-09-01 (×3): qty 1

## 2012-09-01 MED ORDER — DEXMEDETOMIDINE HCL IN NACL 200 MCG/50ML IV SOLN
0.4000 ug/kg/h | INTRAVENOUS | Status: AC
  Administered 2012-09-01 (×2): 1 ug/kg/h via INTRAVENOUS
  Administered 2012-09-01: 0.6 ug/kg/h via INTRAVENOUS
  Administered 2012-09-01: 0.8 ug/kg/h via INTRAVENOUS
  Administered 2012-09-01: 0.2 ug/kg/h via INTRAVENOUS
  Administered 2012-09-01: 0.7 ug/kg/h via INTRAVENOUS
  Administered 2012-09-01: 0.6 ug/kg/h via INTRAVENOUS
  Filled 2012-09-01 (×6): qty 50

## 2012-09-01 MED ORDER — DEXMEDETOMIDINE HCL IN NACL 200 MCG/50ML IV SOLN
INTRAVENOUS | Status: AC
  Filled 2012-09-01: qty 50

## 2012-09-01 MED ORDER — METHADONE HCL 10 MG PO TABS
5.0000 mg | ORAL_TABLET | Freq: Two times a day (BID) | ORAL | Status: DC
  Administered 2012-09-01 – 2012-09-07 (×13): 5 mg via ORAL
  Filled 2012-09-01 (×13): qty 1

## 2012-09-01 MED ORDER — BIOTENE DRY MOUTH MT LIQD
15.0000 mL | Freq: Two times a day (BID) | OROMUCOSAL | Status: DC
  Administered 2012-09-01 – 2012-09-07 (×12): 15 mL via OROMUCOSAL

## 2012-09-01 MED ORDER — SODIUM CHLORIDE 0.9 % IJ SOLN
10.0000 mL | Freq: Two times a day (BID) | INTRAMUSCULAR | Status: DC
  Administered 2012-09-01: 20 mL
  Administered 2012-09-01: 10 mL
  Administered 2012-09-02: 20 mL
  Administered 2012-09-02 – 2012-09-07 (×6): 10 mL

## 2012-09-01 MED ORDER — DILTIAZEM HCL 25 MG/5ML IV SOLN
10.0000 mg | Freq: Once | INTRAVENOUS | Status: AC
  Administered 2012-09-01: 10 mg via INTRAVENOUS

## 2012-09-01 MED ORDER — DILTIAZEM HCL 100 MG IV SOLR
5.0000 mg/h | INTRAVENOUS | Status: DC
  Administered 2012-09-01: 5 mg/h via INTRAVENOUS

## 2012-09-01 MED ORDER — SODIUM CHLORIDE 0.9 % IJ SOLN
10.0000 mL | INTRAMUSCULAR | Status: DC | PRN
  Administered 2012-09-03: 20 mL

## 2012-09-01 MED ORDER — CHLORHEXIDINE GLUCONATE 0.12 % MT SOLN
15.0000 mL | Freq: Two times a day (BID) | OROMUCOSAL | Status: DC
  Administered 2012-09-01 – 2012-09-07 (×11): 15 mL via OROMUCOSAL
  Filled 2012-09-01 (×11): qty 15

## 2012-09-01 NOTE — Progress Notes (Signed)
Per Dr Janna Arch, pt to be titrated off IV Precedex and given po Methadone, then discontinue Precedex one hour after giving Methadone. PO Methadone given at 1511.  Pt taken off Bipap and placed on 5 liters [place]; O2 sats 95-97%. Pt able to answer questions about date, place, and birth date. Visitors at bedside. Will continue to monitor closely.

## 2012-09-01 NOTE — Progress Notes (Signed)
Placed patient on bipap due to sob and dropping sats.

## 2012-09-01 NOTE — Progress Notes (Signed)
Subjective: He has no new complaints but he has continued agitation and confusion. His heart rate has still been up from.  Objective: Vital signs in last 24 hours: Temp:  [97.7 F (36.5 C)-98.4 F (36.9 C)] 98 F (36.7 C) (03/25 0800) Pulse Rate:  [25-148] 63 (03/25 0815) Resp:  [12-28] 13 (03/25 0815) BP: (75-203)/(22-183) 104/57 mmHg (03/25 0815) SpO2:  [82 %-100 %] 98 % (03/25 0819) FiO2 (%):  [60 %-65 %] 60 % (03/25 0819) Weight:  [107.8 kg (237 lb 10.5 oz)] 107.8 kg (237 lb 10.5 oz) (03/25 0500) Weight change: 3.1 kg (6 lb 13.3 oz) Last BM Date: 08/23/12  Intake/Output from previous day: 03/24 0701 - 03/25 0700 In: 2077.3 [P.O.:740; I.V.:1187.3; IV Piggyback:150] Out: 1150 [Urine:1150]  PHYSICAL EXAM General appearance: mild distress Resp: clear to auscultation bilaterally Cardio: regular rate and rhythm, S1, S2 normal, no murmur, click, rub or gallop GI: soft, non-tender; bowel sounds normal; no masses,  no organomegaly Extremities: extremities normal, atraumatic, no cyanosis or edema  Lab Results:    Basic Metabolic Panel:  Recent Labs  16/10/96 0510 08/31/12 0437 09/01/12 0428  NA 139 141 137  K 3.5 3.6 4.2  CL 98 102 101  CO2 31 31 27   GLUCOSE 158* 124* 170*  BUN 36* 27* 26*  CREATININE 1.56* 1.26 1.30  CALCIUM 9.3 8.8 8.8  MG 1.6  --   --    Liver Function Tests: No results found for this basename: AST, ALT, ALKPHOS, BILITOT, PROT, ALBUMIN,  in the last 72 hours No results found for this basename: LIPASE, AMYLASE,  in the last 72 hours No results found for this basename: AMMONIA,  in the last 72 hours CBC:  Recent Labs  08/31/12 0437 08/31/12 0756  WBC 7.2 6.9  HGB 9.8* 9.8*  HCT 30.7* 31.0*  MCV 75.6* 75.8*  PLT 273 221   Cardiac Enzymes: No results found for this basename: CKTOTAL, CKMB, CKMBINDEX, TROPONINI,  in the last 72 hours BNP: No results found for this basename: PROBNP,  in the last 72 hours D-Dimer: No results found for  this basename: DDIMER,  in the last 72 hours CBG:  Recent Labs  08/31/12 1144 08/31/12 1632 08/31/12 1910 09/01/12 0008 09/01/12 0441 09/01/12 0723  GLUCAP 180* 175* 174* 142* 156* 135*   Hemoglobin A1C: No results found for this basename: HGBA1C,  in the last 72 hours Fasting Lipid Panel: No results found for this basename: CHOL, HDL, LDLCALC, TRIG, CHOLHDL, LDLDIRECT,  in the last 72 hours Thyroid Function Tests:  Recent Labs  08/31/12 0756  TSH 1.644   Anemia Panel:  Recent Labs  08/31/12 0756  VITAMINB12 235  FOLATE 7.2  FERRITIN 63  TIBC 233  IRON 13*  RETICCTPCT 1.4   Coagulation:  Recent Labs  08/31/12 0437 09/01/12 0428  LABPROT 25.8* 22.9*  INR 2.50* 2.13*   Urine Drug Screen: Drugs of Abuse  No results found for this basename: labopia, cocainscrnur, labbenz, amphetmu, thcu, labbarb    Alcohol Level: No results found for this basename: ETH,  in the last 72 hours Urinalysis: No results found for this basename: COLORURINE, APPERANCEUR, LABSPEC, PHURINE, GLUCOSEU, HGBUR, BILIRUBINUR, KETONESUR, PROTEINUR, UROBILINOGEN, NITRITE, LEUKOCYTESUR,  in the last 72 hours Misc. Labs:  ABGS  Recent Labs  09/01/12 0027  PHART 7.353  PO2ART 57.6*  TCO2 25.5  HCO3 27.5*   CULTURES Recent Results (from the past 240 hour(s))  URINE CULTURE     Status: None   Collection  Time    08/25/12  4:12 PM      Result Value Range Status   Specimen Description URINE, CATHETERIZED   Final   Special Requests NONE   Final   Culture  Setup Time 08/26/2012 03:22   Final   Colony Count NO GROWTH   Final   Culture NO GROWTH   Final   Report Status 08/26/2012 FINAL   Final  CULTURE, BLOOD (ROUTINE X 2)     Status: None   Collection Time    08/25/12  6:45 PM      Result Value Range Status   Specimen Description BLOOD LEFT ANTECUBITAL   Final   Special Requests BOTTLES DRAWN AEROBIC AND ANAEROBIC 8CC EACH   Final   Culture NO GROWTH 5 DAYS   Final   Report Status  08/30/2012 FINAL   Final  CULTURE, BLOOD (ROUTINE X 2)     Status: None   Collection Time    08/25/12  6:50 PM      Result Value Range Status   Specimen Description BLOOD LEFT HAND   Final   Special Requests BOTTLES DRAWN AEROBIC AND ANAEROBIC 8CC EACH   Final   Culture NO GROWTH 5 DAYS   Final   Report Status 08/30/2012 FINAL   Final  URINE CULTURE     Status: None   Collection Time    08/26/12  7:27 AM      Result Value Range Status   Specimen Description URINE, CLEAN CATCH   Final   Special Requests NONE   Final   Culture  Setup Time 08/27/2012 02:59   Final   Colony Count NO GROWTH   Final   Culture NO GROWTH   Final   Report Status 08/28/2012 FINAL   Final   Studies/Results: Dg Chest Port 1 View  09/01/2012  *RADIOLOGY REPORT*  Clinical Data: Pneumonia.  PORTABLE CHEST - 1 VIEW  Comparison: Chest x-ray 08/31/2012.  Findings: Lung volumes are low.  Severe multifocal interstitial and airspace disease is noted throughout all aspects of the lungs bilaterally, increased compared with yesterday's examination. There may be small bilateral pleural effusions, however, the costophrenic sulci are excluded from the lower margin of the image. Cardiomegaly.  Cephalization of the pulmonary vasculature. The patient is rotated to the right on today's exam, resulting in distortion of the mediastinal contours and reduced diagnostic sensitivity and specificity for mediastinal pathology. Atherosclerosis of the thoracic aorta.  IMPRESSION: 1.  Persistent multifocal bilateral interstitial and airspace disease.  Given the cardiomegaly and cephalization of the pulmonary vasculature, findings are favored to reflect severe pulmonary edema related to congestive heart failure, however, superimposed multilobar pneumonia is difficult to exclude.   Original Report Authenticated By: Trudie Reed, M.D.    Dg Chest Port 1 View  08/31/2012  *RADIOLOGY REPORT*  Clinical Data: Pneumonia  PORTABLE CHEST - 1 VIEW   Comparison: 08/28/2012  Findings: Cardiac shadow remains mildly enlarged.  Patchy bilateral infiltrative changes are again seen.  Given some technical variations of the films there likely is stable.  Abnormality is seen.  IMPRESSION: Stable bilateral infiltrates.   Original Report Authenticated By: Alcide Clever, M.D.     Medications:  Prior to Admission:  Prescriptions prior to admission  Medication Sig Dispense Refill  . atenolol (TENORMIN) 50 MG tablet Take 50 mg by mouth daily.        Marland Kitchen glipiZIDE (GLUCOTROL) 5 MG tablet Take 1 tablet (5 mg total) by mouth daily before breakfast.  30  tablet  5  . hydrocodone-acetaminophen (LORCET-HD) 5-500 MG per capsule Take 1 capsule by mouth every 6 (six) hours as needed for pain.  12 capsule  0  . insulin glargine (LANTUS) 100 UNIT/ML injection Inject 40 Units into the skin at bedtime.       . isosorbide mononitrate (IMDUR) 60 MG 24 hr tablet Take 60 mg by mouth daily.        Marland Kitchen lisinopril (PRINIVIL,ZESTRIL) 40 MG tablet Take 0.5 tablets (20 mg total) by mouth daily.  30 tablet  2  . metFORMIN (GLUCOPHAGE) 500 MG tablet Take 500 mg by mouth daily with breakfast.      . methadone (DOLOPHINE) 10 MG tablet Take 20 mg by mouth 2 (two) times daily.        . nitroGLYCERIN (NITROSTAT) 0.4 MG SL tablet Place 0.4 mg under the tongue every 5 (five) minutes as needed. Chest pain       . pantoprazole (PROTONIX) 40 MG tablet Take 40 mg by mouth daily.        Marland Kitchen PARoxetine (PAXIL) 10 MG tablet Take 2 tablets (20 mg total) by mouth daily.  30 tablet  3  . [DISCONTINUED] aspirin EC 81 MG tablet Take 81 mg by mouth daily.        . [DISCONTINUED] clopidogrel (PLAVIX) 75 MG tablet Take 75 mg by mouth daily.        . [DISCONTINUED] nitroGLYCERIN (NITRODUR - DOSED IN MG/24 HR) 0.4 mg/hr Place 1 patch onto the skin daily.         Scheduled: . antiseptic oral rinse  15 mL Mouth Rinse q12n4p  . aspirin EC  325 mg Oral Daily  . chlorhexidine  15 mL Mouth Rinse BID  . ipratropium   0.5 mg Nebulization Q6H  . iron polysaccharides  150 mg Oral BID  . levalbuterol  0.63 mg Nebulization Q6H  . metoprolol      . metoprolol tartrate  50 mg Oral BID  . nitroGLYCERIN  1 inch Topical Daily  . pantoprazole (PROTONIX) IV  40 mg Intravenous Q24H  . PARoxetine  20 mg Oral Daily  . piperacillin-tazobactam (ZOSYN)  IV  3.375 g Intravenous Q8H  . vancomycin  1,000 mg Intravenous Q12H  . Warfarin - Pharmacist Dosing Inpatient   Does not apply Q24H   Continuous: . sodium chloride 20 mL/hr at 09/01/12 0800  . dexmedetomidine 0.7 mcg/kg/hr (09/01/12 0824)  . dextrose 50 mL/hr at 08/25/12 1353  . diltiazem (CARDIZEM) infusion 5 mg/hr (09/01/12 0800)   ZOX:[id number], metoprolol, nitroGLYCERIN, sodium chloride  Assesment: He continues to have atrial fibrillation with rapid ventricular response. He still has problems with delirium. IV access has become a problem. Principal Problem:   CAD (coronary artery disease), native coronary artery Active Problems:   Atrial fibrillation with rapid ventricular response   IDDM (insulin dependent diabetes mellitus)   Hyperlipidemia LDL goal < 70   Hypertension associated with diabetes   Degenerative disc disease, lumbar   Illiterate   Acute respiratory failure   HCAP (healthcare-associated pneumonia)   COPD exacerbation   COPD (chronic obstructive pulmonary disease)    Plan: He will have a PICC line for IV access. Continue other treatments    LOS: 11 days   Demir Titsworth L 09/01/2012, 8:27 AM

## 2012-09-01 NOTE — Progress Notes (Signed)
ANTICOAGULATION CONSULT NOTE  Pharmacy Consult for Warfarin Indication: Afib  No Known Allergies  Patient Measurements: Height: 5' 7.5" (171.5 cm) Weight: 237 lb 10.5 oz (107.8 kg) IBW/kg (Calculated) : 67.25 Body mass index is 36.65 kg/(m^2).  Vital Signs: Temp: 98 F (36.7 C) (03/25 0800) Temp src: Axillary (03/25 0800) BP: 116/60 mmHg (03/25 1015) Pulse Rate: 65 (03/25 1015)  Labs:  Recent Labs  08/30/12 0510 08/31/12 0437 08/31/12 0756 09/01/12 0428  HGB 10.1* 9.8* 9.8*  --   HCT 30.6* 30.7* 31.0*  --   PLT 282 273 221  --   LABPROT 27.1* 25.8*  --  22.9*  INR 2.67* 2.50*  --  2.13*  CREATININE 1.56* 1.26  --  1.30   Estimated Creatinine Clearance: 63.3 ml/min (by C-G formula based on Cr of 1.3).  Medical History: Past Medical History  Diagnosis Date  . Coronary artery disease   . Hypertension   . Cancer   . Pneumonia   . CHF (congestive heart failure)   . High cholesterol   . Diabetes mellitus   . Hepatitis   . Renal disorder     kidney cancer  . LBBB (left bundle branch block)   . Paroxysmal atrial fibrillation   . Sleep apnea    Medications:  Scheduled:  . antiseptic oral rinse  15 mL Mouth Rinse q12n4p  . aspirin EC  325 mg Oral Daily  . chlorhexidine  15 mL Mouth Rinse BID  . [COMPLETED] dexmedetomidine      . [COMPLETED] diltiazem  10 mg Intravenous Once  . [COMPLETED] diltiazem  10 mg Intravenous Once  . ipratropium  0.5 mg Nebulization Q6H  . iron polysaccharides  150 mg Oral BID  . levalbuterol  0.63 mg Nebulization Q6H  . [EXPIRED] metoprolol      . metoprolol tartrate  50 mg Oral BID  . nitroGLYCERIN  1 inch Topical Daily  . pantoprazole (PROTONIX) IV  40 mg Intravenous Q24H  . PARoxetine  20 mg Oral Daily  . piperacillin-tazobactam (ZOSYN)  IV  3.375 g Intravenous Q8H  . vancomycin  1,000 mg Intravenous Q12H  . [COMPLETED] warfarin  2 mg Oral Once  . Warfarin - Pharmacist Dosing Inpatient   Does not apply Q24H  . [DISCONTINUED]  atenolol  50 mg Oral Daily  . [DISCONTINUED] diltiazem  120 mg Oral Q12H  . [DISCONTINUED] diltiazem  60 mg Oral Q6H  . [DISCONTINUED] vancomycin  750 mg Intravenous Q12H   Assessment: [name] with new onset Afib started on warfarin.   INR therapeutic today but trending down.  Goal of Therapy:  INR 2-3   Plan:  Coumadin 4mg  po x 1 dose today PT/INR daily  Valrie Hart A 09/01/2012,11:03 AM

## 2012-09-01 NOTE — Progress Notes (Signed)
eLink Physician-Brief Progress Note Patient Name: HAIDEN RAWLINSON DOB: Jun 18, 1940 MRN: [id number]  Date of Service  09/01/2012   HPI/Events of Note  Patient admitted to ICU with AF/RVR in the setting of PNA and aspiration.  Had been on dilt gtt with HR control this AM but switched to oral meds by today.  HR has consistently been in the 140s since that time.  Patient is hypertensive and agitated but not combative.  QTc on 12 lead EKG is greater than 500.  Has received IV ativan for agitation without change in behavior.  Sats have been in the high 80s this pm with good waveform.   eICU Interventions  Plan: AF/RVR vs ST vs AFlutter - restart dilt gtt with bolus for HR control Agitation/delirium - start precedex gtt - will consider bolus if patient does not respond - avoid haldol due to elevated QTc Low sats - increase O2 to maintain sats of 90% or greater   Intervention Category Major Interventions: Delirium, psychosis, severe agitation - evaluation and management Intermediate Interventions: Arrhythmia - evaluation and management  Caydan Mctavish 09/01/2012, 12:11 AM

## 2012-09-01 NOTE — Progress Notes (Signed)
UR Chart Review Completed  

## 2012-09-01 NOTE — Progress Notes (Signed)
Unable to contact family. Dr. Juanetta Gosling consulted in placing PICC, deemed medically necessary.

## 2012-09-01 NOTE — Progress Notes (Signed)
Nutrition Follow-up   INTERVENTION: Recommend consider enteral nutrition:   Initiate continuous Osm 1.5  @ 10 ml/hr via NGT and increase by 10 ml every 8 hours to goal rate of 30 ml/hr. Add 30 ml Prostat QID.  At goal rate, tube feeding regimen will provide 1480 kcal,  105 grams of protein, and 548 ml of H2O.   Monitor magnesium, potassium, and phosphorus daily for at least 3 days, MD to replete as needed, as pt is at risk for refeeding syndrome given his hypocaloric intake.   NUTRITION DIAGNOSIS: Inadequate oral intake ; ongoing  Goal: Enteral nutrition to provide 60-70% of estimated calorie needs (22-25 kcals/kg ideal body weight) and 100% of estimated protein needs, based on ASPEN guidelines for permissive underfeeding in critically ill obese individuals.  Monitor:  Nutrition support, Po intake, labs and wt trends  Reason for Assessment: Low Braden/follow-up  72 y.o. male  Admitting Dx: CAD (coronary artery disease), native coronary artery  ASSESSMENT:   Pt dx includes right upper lobe PNA, renal insufficiency, altered mental status, possible asymmetrical edema. Lasix started. His wt has decreased 15#(6.8 kg), 6% in 5 days. He has 2+ edema to RLE/LLE. Braden=12 which places him high risk for skin breakdown.  PICC placed today for IV access.  Pt has not had a BM since 08/23/12. Poor oral intake continues. It may be difficult to meet his nutrition needs orally given his continued delirium. Recommend consider enteral nutrition initiation.     Height: Ht Readings from Last 1 Encounters:  08/21/12 5' 7.5" (1.715 m)    Weight: Wt Readings from Last 1 Encounters:  09/01/12 237 lb 10.5 oz (107.8 kg)    Ideal Body Weight: 148# (67.2 kg)  % Ideal Body Weight: 170%  Wt Readings from Last 10 Encounters:  09/01/12 237 lb 10.5 oz (107.8 kg)  06/15/12 242 lb (109.77 kg)  03/14/12 242 lb (109.77 kg)  03/09/12 242 lb (109.77 kg)   12/21/11 240 lb (108.863 kg)  12/15/11 252 lb (114.306 kg)  06/14/11 244 lb 7.8 oz (110.9 kg)  06/09/11 259 lb 7.7 oz (117.7 kg)  02/08/11 252 lb (114.306 kg)    Usual Body Weight: 240-250#  % Usual Body Weight: 103%  BMI:  Body mass index is 36.65 kg/(m^2).Obesity Class II  Estimated Nutritional Needs: Kcal: 1474-1608 (22-25 kcal/kg IBW) Protein: 100-134 gr Fluid: > 2000 ml/day (normal needs)  Skin: No issues noted  Diet Order: Carb Control  EDUCATION NEEDS: -Education not appropriate at this time   Intake/Output Summary (Last 24 hours) at 09/01/12 0936 Last data filed at 09/01/12 0800  Gross per 24 hour  Intake 1418.12 ml  Output   1350 ml  Net  68.12 ml    Last BM: 08/23/12  Labs:   Recent Labs Lab 08/26/12 0450 08/26/12 1312  08/29/12 0502 08/30/12 0510 08/31/12 0437 09/01/12 0428  NA 130*  --   < > 142 139 141 137  K 5.0  --   < > 3.5 3.5 3.6 4.2  CL 95*  --   < > 98 98 102 101  CO2 24  --   < > 31 31 31 27   BUN 54*  --   < > 52* 36* 27* 26*  CREATININE 3.55*  --   < > 2.35* 1.56* 1.26 1.30  CALCIUM 8.6 8.1*  < > 9.8 9.3 8.8 8.8  MG  --   --   --   --  1.6  --   --  PHOS  --  5.6*  --  3.4  --   --   --   GLUCOSE 137*  --   < > 147* 158* 124* 170*  < > = values in this interval not displayed.  CBG (last 3)   Recent Labs  09/01/12 0008 09/01/12 0441 09/01/12 0723  GLUCAP 142* 156* 135*    Scheduled Meds: . antiseptic oral rinse  15 mL Mouth Rinse q12n4p  . aspirin EC  325 mg Oral Daily  . chlorhexidine  15 mL Mouth Rinse BID  . ipratropium  0.5 mg Nebulization Q6H  . iron polysaccharides  150 mg Oral BID  . levalbuterol  0.63 mg Nebulization Q6H  . metoprolol tartrate  50 mg Oral BID  . nitroGLYCERIN  1 inch Topical Daily  . pantoprazole (PROTONIX) IV  40 mg Intravenous Q24H  . PARoxetine  20 mg Oral Daily  . piperacillin-tazobactam (ZOSYN)  IV  3.375 g Intravenous Q8H  . vancomycin  1,000 mg Intravenous Q12H  . Warfarin -  Pharmacist Dosing Inpatient   Does not apply Q24H    Continuous Infusions: . sodium chloride 20 mL/hr at 09/01/12 0800  . dexmedetomidine 0.6 mcg/kg/hr (09/01/12 0824)  . dextrose 50 mL/hr at 08/25/12 1353  . diltiazem (CARDIZEM) infusion 2.5 mg/hr (09/01/12 0935)    Past Medical History  Diagnosis Date  . Coronary artery disease   . Hypertension   . Cancer   . Pneumonia   . CHF (congestive heart failure)   . High cholesterol   . Diabetes mellitus   . Hepatitis   . Renal disorder     kidney cancer  . LBBB (left bundle branch block)   . Paroxysmal atrial fibrillation   . Sleep apnea     Past Surgical History  Procedure Laterality Date  . Back surgery    . Coronary angioplasty with stent placement    . Nephrectomy      Royann Shivers MS,RD,LDN,CSG Office: [phone] Pager: [phone]

## 2012-09-01 NOTE — Progress Notes (Signed)
[id number] 

## 2012-09-01 NOTE — Progress Notes (Signed)
We attempted to see pt for eval as ordered, but RN asks that we delay until tomorrow as he had just transitioned off of BiPap to nasal cannula.  RN does not want him to be stressed at this time.

## 2012-09-01 NOTE — Progress Notes (Signed)
Subjective: Interval History: has no complaint of difficulty breathing. Patient has this moment denies any nausea or vomiting. His appetite is good..  Objective: Vital signs in last 24 hours: Temp:  [97.7 F (36.5 C)-98.4 F (36.9 C)] 98.3 F (36.8 C) (03/25 0400) Pulse Rate:  [25-148] 64 (03/25 0800) Resp:  [12-28] 13 (03/25 0800) BP: (75-203)/(22-183) 103/57 mmHg (03/25 0800) SpO2:  [82 %-100 %] 99 % (03/25 0800) FiO2 (%):  [60 %-65 %] 60 % (03/25 0715) Weight:  [107.8 kg (237 lb 10.5 oz)] 107.8 kg (237 lb 10.5 oz) (03/25 0500) Weight change: 3.1 kg (6 lb 13.3 oz)  Intake/Output from previous day: 03/24 0701 - 03/25 0700 In: 1994 [P.O.:740; I.V.:1104; IV Piggyback:150] Out: 1150 [Urine:1150] Intake/Output this shift:    General appearance: alert, cooperative and no distress Resp: clear to auscultation bilaterally Cardio: regular rate and rhythm, S1, S2 normal, no murmur, click, rub or gallop GI: soft, non-tender; bowel sounds normal; no masses,  no organomegaly Extremities: extremities normal, atraumatic, no cyanosis or edema  Lab Results:  Recent Labs  08/31/12 0437 08/31/12 0756  WBC 7.2 6.9  HGB 9.8* 9.8*  HCT 30.7* 31.0*  PLT 273 221   BMET:  Recent Labs  08/31/12 0437 09/01/12 0428  NA 141 137  K 3.6 4.2  CL 102 101  CO2 31 27  GLUCOSE 124* 170*  BUN 27* 26*  CREATININE 1.26 1.30  CALCIUM 8.8 8.8   No results found for this basename: PTH,  in the last 72 hours Iron Studies:  Recent Labs  08/31/12 0756  IRON 13*  TIBC 233  FERRITIN 63    Studies/Results: Dg Chest Port 1 View  09/01/2012  *RADIOLOGY REPORT*  Clinical Data: Pneumonia.  PORTABLE CHEST - 1 VIEW  Comparison: Chest x-ray 08/31/2012.  Findings: Lung volumes are low.  Severe multifocal interstitial and airspace disease is noted throughout all aspects of the lungs bilaterally, increased compared with yesterday's examination. There may be small bilateral pleural effusions, however, the  costophrenic sulci are excluded from the lower margin of the image. Cardiomegaly.  Cephalization of the pulmonary vasculature. The patient is rotated to the right on today's exam, resulting in distortion of the mediastinal contours and reduced diagnostic sensitivity and specificity for mediastinal pathology. Atherosclerosis of the thoracic aorta.  IMPRESSION: 1.  Persistent multifocal bilateral interstitial and airspace disease.  Given the cardiomegaly and cephalization of the pulmonary vasculature, findings are favored to reflect severe pulmonary edema related to congestive heart failure, however, superimposed multilobar pneumonia is difficult to exclude.   Original Report Authenticated By: Trudie Reed, M.D.    Dg Chest Port 1 View  08/31/2012  *RADIOLOGY REPORT*  Clinical Data: Pneumonia  PORTABLE CHEST - 1 VIEW  Comparison: 08/28/2012  Findings: Cardiac shadow remains mildly enlarged.  Patchy bilateral infiltrative changes are again seen.  Given some technical variations of the films there likely is stable.  Abnormality is seen.  IMPRESSION: Stable bilateral infiltrates.   Original Report Authenticated By: Alcide Clever, M.D.     I have reviewed the patient's current medications.  Assessment/Plan: Problem #1 renal failure his BUN and creatinine has returned to his baseline. Presently patient doesn't have any uremic sinus symptoms. Problem #2 diabetes Problem #3 a trial fibrillation Problem #4 hypertension blood pressure seems to be fluctuating presently seems to be was in acceptable range. Problem #5 history of coronary artery disease Problem #6 history of degenerative joint disease Problem #7 COPD Plan: Continue his present management Since his renal  function has improved  Sign off Her follow patient when his discharged as outpatient.    LOS: 11 days   Iisha Soyars S 09/01/2012,8:09 AM

## 2012-09-02 LAB — GLUCOSE, CAPILLARY
Glucose-Capillary: 114 mg/dL — ABNORMAL HIGH (ref 70–99)
Glucose-Capillary: 132 mg/dL — ABNORMAL HIGH (ref 70–99)
Glucose-Capillary: 144 mg/dL — ABNORMAL HIGH (ref 70–99)

## 2012-09-02 LAB — CBC
Platelets: 256 10*3/uL (ref 150–400)
RDW: 16.2 % — ABNORMAL HIGH (ref 11.5–15.5)
WBC: 9.8 10*3/uL (ref 4.0–10.5)

## 2012-09-02 LAB — VANCOMYCIN, RANDOM: Vancomycin Rm: 18.8 ug/mL

## 2012-09-02 LAB — PROTIME-INR: INR: 2.2 — ABNORMAL HIGH (ref 0.00–1.49)

## 2012-09-02 MED ORDER — PANTOPRAZOLE SODIUM 40 MG PO TBEC
40.0000 mg | DELAYED_RELEASE_TABLET | Freq: Every day | ORAL | Status: DC
  Administered 2012-09-03 – 2012-09-07 (×5): 40 mg via ORAL
  Filled 2012-09-02 (×5): qty 1

## 2012-09-02 MED ORDER — WARFARIN SODIUM 2.5 MG PO TABS
2.5000 mg | ORAL_TABLET | Freq: Once | ORAL | Status: AC
  Administered 2012-09-02: 2.5 mg via ORAL
  Filled 2012-09-02: qty 1

## 2012-09-02 NOTE — Progress Notes (Signed)
ANTICOAGULATION CONSULT NOTE  Pharmacy Consult for Warfarin Indication: Afib  No Known Allergies  Patient Measurements: Height: 5' 7.5" (171.5 cm) Weight: 242 lb 11.6 oz (110.1 kg) IBW/kg (Calculated) : 67.25 Body mass index is 37.43 kg/(m^2).  Vital Signs: Temp: 97.6 F (36.4 C) (03/26 0800) Temp src: Axillary (03/26 0800) BP: 138/74 mmHg (03/26 0600) Pulse Rate: 80 (03/26 0600)  Labs:  Recent Labs  08/31/12 0437 08/31/12 0756 09/01/12 0428 09/01/12 1448 09/02/12 0354  HGB 9.8* 9.8*  --  8.4* 9.1*  HCT 30.7* 31.0*  --  26.0* 28.6*  PLT 273 221  --  200 256  LABPROT 25.8*  --  22.9*  --  23.5*  INR 2.50*  --  2.13*  --  2.20*  CREATININE 1.26  --  1.30  --   --    Estimated Creatinine Clearance: 64 ml/min (by C-G formula based on Cr of 1.3).  Medical History: Past Medical History  Diagnosis Date  . Coronary artery disease   . Hypertension   . Cancer   . Pneumonia   . CHF (congestive heart failure)   . High cholesterol   . Diabetes mellitus   . Hepatitis   . Renal disorder     kidney cancer  . LBBB (left bundle branch block)   . Paroxysmal atrial fibrillation   . Sleep apnea    Medications:  Scheduled:  . antiseptic oral rinse  15 mL Mouth Rinse q12n4p  . aspirin EC  325 mg Oral Daily  . chlorhexidine  15 mL Mouth Rinse BID  . diltiazem  120 mg Oral Q12H  . ipratropium  0.5 mg Nebulization Q6H  . iron polysaccharides  150 mg Oral BID  . levalbuterol  0.63 mg Nebulization Q6H  . methadone  5 mg Oral Q12H  . metoprolol tartrate  50 mg Oral BID  . nitroGLYCERIN  1 inch Topical Daily  . pantoprazole (PROTONIX) IV  40 mg Intravenous Q24H  . PARoxetine  20 mg Oral Daily  . piperacillin-tazobactam (ZOSYN)  IV  3.375 g Intravenous Q8H  . sodium chloride  10-40 mL Intracatheter Q12H  . vancomycin  1,000 mg Intravenous Q12H  . [COMPLETED] warfarin  4 mg Oral Once  . Warfarin - Pharmacist Dosing Inpatient   Does not apply Q24H   Assessment: [name] with  new onset Afib started on warfarin.   INR remains therapeutic today.  No bleeding noted.   Goal of Therapy:  INR 2-3   Plan:  Coumadin 2.5mg  po x 1 dose today PT/INR daily *Recommend decrease aspirin to 81mg  with therapeutic INR  Braelynne Garinger, [facility] 09/02/2012,10:29 AM

## 2012-09-02 NOTE — Progress Notes (Signed)
[name], [name]                ACCOUNT NO.:  [id number]  MEDICAL RECORD NO.:  [id number]  LOCATION:  IC06                          FACILITY:  [facility]  PHYSICIAN:  Melvyn Novas, MDDATE OF BIRTH:  10/14/1940  DATE OF PROCEDURE: DATE OF DISCHARGE:                                PROGRESS NOTE   The patient remains in sinus rhythm, rate of 84 beats per minute.  Blood pressure 122/68.  No respiratory distress.  Alert and oriented.  Today, WBC is 9.8, hemoglobin 9.1, good glycemic control.  Blood pressure under good control.  The patient has multiple cardiac stents.  No evidence of ischemia at present.  Rhythm control currently with Cardizem CD 120 q.12 as well as Lopressor 50 p.o. b.i.d.  The patient was given low dose methadone to avoid any possible withdrawal symptoms and agitation.  He is currently on 5 q.12 and appears to be doing well.  Lungs show diminished breath sounds at bases.  No rales, wheeze, or rhonchi. Heart, regular rhythm.  No S3 or S4, auscultated.  No heaves, thrills, or rubs.  Plan right now is to increase ambulation out of bed 3 times a day. Physical therapy.  Continue Coumadin.  INR was 2.1 today and we will make further recommendations as the database expands.     Melvyn Novas, MD     RMD/MEDQ  D:  09/02/2012  T:  09/02/2012  Job:  [id number]

## 2012-09-02 NOTE — Progress Notes (Signed)
[id number] 

## 2012-09-02 NOTE — Progress Notes (Signed)
ANTIBIOTIC CONSULT NOTE  Pharmacy Consult for Vancomycin & Zosyn Indication: pneumonia  No Known Allergies  Patient Measurements: Height: 5' 7.5" (171.5 cm) Weight: 242 lb 11.6 oz (110.1 kg) IBW/kg (Calculated) : 67.25  Vital Signs: Temp: 97.6 F (36.4 C) (03/26 0800) Temp src: Axillary (03/26 0800) BP: 138/74 mmHg (03/26 0600) Pulse Rate: 80 (03/26 0600) Intake/Output from previous day: 03/25 0701 - 03/26 0700 In: 2020.1 [P.O.:400; I.V.:670.1; IV Piggyback:950] Out: 1250 [Urine:1250] Intake/Output from this shift:    Labs:  Recent Labs  08/31/12 0437 08/31/12 0756 09/01/12 0428 09/01/12 1448 09/02/12 0354  WBC 7.2 6.9  --  5.9 9.8  HGB 9.8* 9.8*  --  8.4* 9.1*  PLT 273 221  --  200 256  CREATININE 1.26  --  1.30  --   --    Estimated Creatinine Clearance: 64 ml/min (by C-G formula based on Cr of 1.3).  Recent Labs  09/02/12 0001  VANCORANDOM 18.8     Microbiology: Recent Results (from the past 720 hour(s))  MRSA PCR SCREENING     Status: None   Collection Time    08/21/12  6:45 PM      Result Value Range Status   MRSA by PCR NEGATIVE  NEGATIVE Final   Comment:            The GeneXpert MRSA Assay (FDA     approved for NASAL specimens     only), is one component of a     comprehensive MRSA colonization     surveillance program. It is not     intended to diagnose MRSA     infection nor to guide or     monitor treatment for     MRSA infections.  URINE CULTURE     Status: None   Collection Time    08/25/12  4:12 PM      Result Value Range Status   Specimen Description URINE, CATHETERIZED   Final   Special Requests NONE   Final   Culture  Setup Time 08/26/2012 03:22   Final   Colony Count NO GROWTH   Final   Culture NO GROWTH   Final   Report Status 08/26/2012 FINAL   Final  CULTURE, BLOOD (ROUTINE X 2)     Status: None   Collection Time    08/25/12  6:45 PM      Result Value Range Status   Specimen Description BLOOD LEFT ANTECUBITAL   Final    Special Requests BOTTLES DRAWN AEROBIC AND ANAEROBIC 8CC EACH   Final   Culture NO GROWTH 5 DAYS   Final   Report Status 08/30/2012 FINAL   Final  CULTURE, BLOOD (ROUTINE X 2)     Status: None   Collection Time    08/25/12  6:50 PM      Result Value Range Status   Specimen Description BLOOD LEFT HAND   Final   Special Requests BOTTLES DRAWN AEROBIC AND ANAEROBIC 8CC EACH   Final   Culture NO GROWTH 5 DAYS   Final   Report Status 08/30/2012 FINAL   Final  URINE CULTURE     Status: None   Collection Time    08/26/12  7:27 AM      Result Value Range Status   Specimen Description URINE, CLEAN CATCH   Final   Special Requests NONE   Final   Culture  Setup Time 08/27/2012 02:59   Final   Colony Count NO GROWTH   Final  Culture NO GROWTH   Final   Report Status 08/28/2012 FINAL   Final   Medical History: Past Medical History  Diagnosis Date  . Coronary artery disease   . Hypertension   . Cancer   . Pneumonia   . CHF (congestive heart failure)   . High cholesterol   . Diabetes mellitus   . Hepatitis   . Renal disorder     kidney cancer  . LBBB (left bundle branch block)   . Paroxysmal atrial fibrillation   . Sleep apnea    Medications:  Scheduled:  . antiseptic oral rinse  15 mL Mouth Rinse q12n4p  . aspirin EC  325 mg Oral Daily  . chlorhexidine  15 mL Mouth Rinse BID  . diltiazem  120 mg Oral Q12H  . ipratropium  0.5 mg Nebulization Q6H  . iron polysaccharides  150 mg Oral BID  . levalbuterol  0.63 mg Nebulization Q6H  . methadone  5 mg Oral Q12H  . metoprolol tartrate  50 mg Oral BID  . nitroGLYCERIN  1 inch Topical Daily  . pantoprazole (PROTONIX) IV  40 mg Intravenous Q24H  . PARoxetine  20 mg Oral Daily  . piperacillin-tazobactam (ZOSYN)  IV  3.375 g Intravenous Q8H  . sodium chloride  10-40 mL Intracatheter Q12H  . vancomycin  1,000 mg Intravenous Q12H  . [COMPLETED] warfarin  4 mg Oral Once  . Warfarin - Pharmacist Dosing Inpatient   Does not apply Q24H    Assessment: [name] who has been hospitalized since 3/14 for Afib.  He declined prior to discharge on 3/18 & was noted to be hypoxic with new infiltrate per CXR.  He is currently on day#8 broad spectrum antibiotics for HCAP/aspiration PNA.  Acute on chronic renal failure has resolved.  All culture data has been negative. Pt is clinically much improved.   Vanc 3/19>> Zosyn 3/19>>   Goal of Therapy:  Vancomycin trough level 15-20 mcg/ml  Plan:  1) Continue Zosyn 3.375gm IV Q8h to be infused over 4hrs 2) Continue Vancomycin 1000mg  IV every 12 hours 3) Weekly Vancomycin trough while on Vancomycin 4) Monitor renal function, cx data, and patient progress 5) Duration of therapy per MD- recommend d/c 6) Change Protonix to po per policy (see below)  The patient is receiving Protonix by the intravenous route.  Based on criteria approved by the Pharmacy and Therapeutics Committee and the Medical Executive Committee, the medication is being converted to the equivalent oral dose form.  These criteria include: -No Active GI bleeding -Able to tolerate diet of full liquids (or better) or tube feeding OR able to tolerate other medications by the oral or enteral route  If you have any questions about this conversion, please contact the Pharmacy Department (ext [phone]).  Thank you.  Elson Clan, [facility] 09/02/2012 10:38 AM

## 2012-09-02 NOTE — Progress Notes (Signed)
An attempt was made to see pt for an evaluation.  He states that if we try to have him get up and move around that it will just stir up his OA and make his life miserable.  PTA, he was active and independent per his report.  I explained our role in the medical process and assured him that we were simply trying to assess for any needs he might have once he returns home.  He states "well I might need a cane or something...you could come back either later this afternoon or first thing in the morning and I will try to work with you".  We will accommodate this wish to the best of our ability.

## 2012-09-02 NOTE — Progress Notes (Signed)
[name], [name]                ACCOUNT NO.:  [id number]  MEDICAL RECORD NO.:  [id number]  LOCATION:  IC06                          FACILITY:  [facility]  PHYSICIAN:  Melvyn Novas, MDDATE OF BIRTH:  Sep 23, 1940  DATE OF PROCEDURE: DATE OF DISCHARGE:                                PROGRESS NOTE   The patient currently is in sinus rhythm.  Rate of 76 beats per minute. IV Cardizem down to 2.5 mg/hour.  We will reinstitute Cardizem CD 120 p.o. q.12.  He is currently on Lopressor 50 b.i.d.Marland Kitchen  Blood pressure 109/68, temperature 97.8, pulse 65 and regular, respiratory rate is 11, O2 sat is 100% on BiPAP.  A pH 7.35 this morning.  PCO2 was 51, pO2 was 57, with 89% sat.  The patient requires some positive pressure ventilatory assistance.  Hemoglobin stable at 9.8.  Creatinine is stable and improved to 1.3.  Lungs show diminished breath sounds at the bases. Prolonged expiratory phase.  Scattered rhonchi.  No rales, no wheeze. Heart, regular rhythm.  No S3, S4.  No heaves, thrills, or rubs.  The plan right now is to get CBC in a.m.  Continue monitoring renal function.  Wean IV Cardizem.  Give AV nodal blocking agents p.o., Cardizem p.o., Lopressor.  The patient has normal systolic function, ambulate patient with physical therapy, respiratory therapy, and BiPAP.     Melvyn Novas, MD     RMD/MEDQ  D:  09/01/2012  T:  09/02/2012  Job:  [id number]

## 2012-09-02 NOTE — Progress Notes (Signed)
Subjective: He looks significantly better. He has no new complaints. His breathing is better. He is less confused.  Objective: Vital signs in last 24 hours: Temp:  [97.6 F (36.4 C)-98.9 F (37.2 C)] 98.9 F (37.2 C) (03/26 0549) Pulse Rate:  [62-104] 80 (03/26 0600) Resp:  [9-25] 16 (03/26 0600) BP: (97-216)/(57-92) 138/74 mmHg (03/26 0600) SpO2:  [94 %-100 %] 96 % (03/26 0708) FiO2 (%):  [5 %-50 %] 45 % (03/26 0708) Weight:  [110.1 kg (242 lb 11.6 oz)] 110.1 kg (242 lb 11.6 oz) (03/26 0549) Weight change: 2.3 kg (5 lb 1.1 oz) Last BM Date: 08/23/12  Intake/Output from previous day: 03/25 0701 - 03/26 0700 In: 2020.1 [P.O.:400; I.V.:670.1; IV Piggyback:950] Out: 1250 [Urine:1250]  PHYSICAL EXAM General appearance: alert and mild distress Resp: clear to auscultation bilaterally Cardio: regular rate and rhythm, S1, S2 normal, no murmur, click, rub or gallop GI: soft, non-tender; bowel sounds normal; no masses,  no organomegaly Extremities: extremities normal, atraumatic, no cyanosis or edema  Lab Results:    Basic Metabolic Panel:  Recent Labs  16/10/96 0437 09/01/12 0428 09/01/12 1448  NA 141 137  --   K 3.6 4.2  --   CL 102 101  --   CO2 31 27  --   GLUCOSE 124* 170*  --   BUN 27* 26*  --   CREATININE 1.26 1.30  --   CALCIUM 8.8 8.8  --   MG  --   --  1.8   Liver Function Tests: No results found for this basename: AST, ALT, ALKPHOS, BILITOT, PROT, ALBUMIN,  in the last 72 hours No results found for this basename: LIPASE, AMYLASE,  in the last 72 hours No results found for this basename: AMMONIA,  in the last 72 hours CBC:  Recent Labs  09/01/12 1448 09/02/12 0354  WBC 5.9 9.8  NEUTROABS 3.8  --   HGB 8.4* 9.1*  HCT 26.0* 28.6*  MCV 75.4* 75.1*  PLT 200 256   Cardiac Enzymes: No results found for this basename: CKTOTAL, CKMB, CKMBINDEX, TROPONINI,  in the last 72 hours BNP: No results found for this basename: PROBNP,  in the last 72  hours D-Dimer: No results found for this basename: DDIMER,  in the last 72 hours CBG:  Recent Labs  09/01/12 1155 09/01/12 1614 09/01/12 2012 09/02/12 0007 09/02/12 0420 09/02/12 0743  GLUCAP 126* 126* 144* 114* 159* 125*   Hemoglobin A1C: No results found for this basename: HGBA1C,  in the last 72 hours Fasting Lipid Panel: No results found for this basename: CHOL, HDL, LDLCALC, TRIG, CHOLHDL, LDLDIRECT,  in the last 72 hours Thyroid Function Tests:  Recent Labs  08/31/12 0756  TSH 1.644   Anemia Panel:  Recent Labs  08/31/12 0756  VITAMINB12 235  FOLATE 7.2  FERRITIN 63  TIBC 233  IRON 13*  RETICCTPCT 1.4   Coagulation:  Recent Labs  09/01/12 0428 09/02/12 0354  LABPROT 22.9* 23.5*  INR 2.13* 2.20*   Urine Drug Screen: Drugs of Abuse  No results found for this basename: labopia, cocainscrnur, labbenz, amphetmu, thcu, labbarb    Alcohol Level: No results found for this basename: ETH,  in the last 72 hours Urinalysis: No results found for this basename: COLORURINE, APPERANCEUR, LABSPEC, PHURINE, GLUCOSEU, HGBUR, BILIRUBINUR, KETONESUR, PROTEINUR, UROBILINOGEN, NITRITE, LEUKOCYTESUR,  in the last 72 hours Misc. Labs:  ABGS  Recent Labs  09/01/12 0027  PHART 7.353  PO2ART 57.6*  TCO2 25.5  HCO3 27.5*  CULTURES Recent Results (from the past 240 hour(s))  URINE CULTURE     Status: None   Collection Time    08/25/12  4:12 PM      Result Value Range Status   Specimen Description URINE, CATHETERIZED   Final   Special Requests NONE   Final   Culture  Setup Time 08/26/2012 03:22   Final   Colony Count NO GROWTH   Final   Culture NO GROWTH   Final   Report Status 08/26/2012 FINAL   Final  CULTURE, BLOOD (ROUTINE X 2)     Status: None   Collection Time    08/25/12  6:45 PM      Result Value Range Status   Specimen Description BLOOD LEFT ANTECUBITAL   Final   Special Requests BOTTLES DRAWN AEROBIC AND ANAEROBIC 8CC EACH   Final   Culture NO  GROWTH 5 DAYS   Final   Report Status 08/30/2012 FINAL   Final  CULTURE, BLOOD (ROUTINE X 2)     Status: None   Collection Time    08/25/12  6:50 PM      Result Value Range Status   Specimen Description BLOOD LEFT HAND   Final   Special Requests BOTTLES DRAWN AEROBIC AND ANAEROBIC 8CC EACH   Final   Culture NO GROWTH 5 DAYS   Final   Report Status 08/30/2012 FINAL   Final  URINE CULTURE     Status: None   Collection Time    08/26/12  7:27 AM      Result Value Range Status   Specimen Description URINE, CLEAN CATCH   Final   Special Requests NONE   Final   Culture  Setup Time 08/27/2012 02:59   Final   Colony Count NO GROWTH   Final   Culture NO GROWTH   Final   Report Status 08/28/2012 FINAL   Final   Studies/Results: Dg Chest Port 1 View  09/01/2012  *RADIOLOGY REPORT*  Clinical Data: PICC line placement.  PORTABLE CHEST - 1 VIEW  Comparison: Same date.  Findings: Interval placement of right-sided PICC line with distal tip in the expected position of the right atrium.  Stable mild cardiomegaly is noted.  Bilateral airspace opacities are noted concerning for pneumonia or edema.  Bony thorax is intact.  IMPRESSION: Continued presence of bilateral lung opacities concerning for pneumonia or edema.  Right-sided PICC line has been placed with distal tip in expected position of right atrium; it is recommended to be withdrawn at least 2 cm.   Original Report Authenticated By: Lupita Raider.,  M.D.    [facility] 1 View  09/01/2012  *RADIOLOGY REPORT*  Clinical Data: Pneumonia.  PORTABLE CHEST - 1 VIEW  Comparison: Chest x-ray 08/31/2012.  Findings: Lung volumes are low.  Severe multifocal interstitial and airspace disease is noted throughout all aspects of the lungs bilaterally, increased compared with yesterday's examination. There may be small bilateral pleural effusions, however, the costophrenic sulci are excluded from the lower margin of the image. Cardiomegaly.  Cephalization of the  pulmonary vasculature. The patient is rotated to the right on today's exam, resulting in distortion of the mediastinal contours and reduced diagnostic sensitivity and specificity for mediastinal pathology. Atherosclerosis of the thoracic aorta.  IMPRESSION: 1.  Persistent multifocal bilateral interstitial and airspace disease.  Given the cardiomegaly and cephalization of the pulmonary vasculature, findings are favored to reflect severe pulmonary edema related to congestive heart failure, however, superimposed multilobar pneumonia is difficult  to exclude.   Original Report Authenticated By: Trudie Reed, M.D.     Medications:  Scheduled: . antiseptic oral rinse  15 mL Mouth Rinse q12n4p  . aspirin EC  325 mg Oral Daily  . chlorhexidine  15 mL Mouth Rinse BID  . diltiazem  120 mg Oral Q12H  . ipratropium  0.5 mg Nebulization Q6H  . iron polysaccharides  150 mg Oral BID  . levalbuterol  0.63 mg Nebulization Q6H  . methadone  5 mg Oral Q12H  . metoprolol tartrate  50 mg Oral BID  . nitroGLYCERIN  1 inch Topical Daily  . pantoprazole (PROTONIX) IV  40 mg Intravenous Q24H  . PARoxetine  20 mg Oral Daily  . piperacillin-tazobactam (ZOSYN)  IV  3.375 g Intravenous Q8H  . sodium chloride  10-40 mL Intracatheter Q12H  . vancomycin  1,000 mg Intravenous Q12H  . Warfarin - Pharmacist Dosing Inpatient   Does not apply Q24H   Continuous: . sodium chloride 20 mL/hr at 09/02/12 0600  . dextrose 50 mL/hr at 08/25/12 1353  . diltiazem (CARDIZEM) infusion Stopped (09/01/12 1630)   XBM:WUXLKGMWN, bisacodyl, dextrose, metoprolol, nitroGLYCERIN, sodium chloride, sodium chloride  Assesment: He had acute respiratory failure associated with aspiration healthcare associated pneumonia. He is improving. He had atrial fibrillation with rapid ventricular response and that has been somewhat difficult to control. He does have COPD. He had significant problems with encephalopathy from his medical illness but that  looks better now. Principal Problem:   CAD (coronary artery disease), native coronary artery Active Problems:   Atrial fibrillation with rapid ventricular response   IDDM (insulin dependent diabetes mellitus)   Hyperlipidemia LDL goal < 70   Hypertension associated with diabetes   Degenerative disc disease, lumbar   Illiterate   Acute respiratory failure   HCAP (healthcare-associated pneumonia)   COPD exacerbation   COPD (chronic obstructive pulmonary disease)    Plan: Continue current medications I will plan to follow somewhat more peripherally since he is so much improved    LOS: 12 days   [name] L 09/02/2012, 8:56 AM

## 2012-09-03 ENCOUNTER — Inpatient Hospital Stay (HOSPITAL_COMMUNITY): Payer: Medicare Other

## 2012-09-03 LAB — GLUCOSE, CAPILLARY
Glucose-Capillary: 109 mg/dL — ABNORMAL HIGH (ref 70–99)
Glucose-Capillary: 151 mg/dL — ABNORMAL HIGH (ref 70–99)
Glucose-Capillary: 164 mg/dL — ABNORMAL HIGH (ref 70–99)

## 2012-09-03 LAB — CBC
MCH: 24.1 pg — ABNORMAL LOW (ref 26.0–34.0)
MCV: 76.7 fL — ABNORMAL LOW (ref 78.0–100.0)
Platelets: 341 10*3/uL (ref 150–400)
RBC: 3.9 MIL/uL — ABNORMAL LOW (ref 4.22–5.81)
RDW: 16.8 % — ABNORMAL HIGH (ref 11.5–15.5)

## 2012-09-03 LAB — DIFFERENTIAL
Basophils Absolute: 0.1 10*3/uL (ref 0.0–0.1)
Basophils Relative: 1 % (ref 0–1)
Eosinophils Absolute: 0.2 10*3/uL (ref 0.0–0.7)
Eosinophils Relative: 2 % (ref 0–5)
Lymphs Abs: 0.8 10*3/uL (ref 0.7–4.0)
Neutrophils Relative %: 83 % — ABNORMAL HIGH (ref 43–77)

## 2012-09-03 LAB — PROTIME-INR: INR: 1.78 — ABNORMAL HIGH (ref 0.00–1.49)

## 2012-09-03 MED ORDER — WARFARIN SODIUM 5 MG PO TABS: 5.0000 mg | ORAL_TABLET | Freq: Once | ORAL | Status: AC

## 2012-09-03 MED ORDER — CLONIDINE HCL 0.1 MG PO TABS
0.1000 mg | ORAL_TABLET | Freq: Three times a day (TID) | ORAL | Status: DC
  Administered 2012-09-03 – 2012-09-07 (×12): 0.1 mg via ORAL
  Filled 2012-09-03 (×12): qty 1

## 2012-09-03 MED ORDER — CLONIDINE HCL 0.1 MG PO TABS: 0.1000 mg | ORAL_TABLET | Freq: Three times a day (TID) | ORAL | Status: DC

## 2012-09-03 NOTE — Evaluation (Signed)
Physical Therapy Evaluation Patient Details Name: [name] MRN: [id number] DOB: 1940-11-03 Today's Date: 09/03/2012 Time: 2952-8413 PT Time Calculation (min): 45 min  PT Assessment / Plan / Recommendation Clinical Impression  Pt was seen for evaluation.  He lives alone but currently has a friend staying with him who will be able to assist as needed.  Pt is alert and cooperative, perhaps mildly confused and definately unrealistic about how deconditioned he has become.  His hip strength is decreased due to multiple orthopedic surgeries and OA.  He is currently on 5L O2/min and becomes very dyspneic with any exertion.  When I first arrived, he told me to disconnect him from his O2...that he didn't need it.  I explained to him the necessitty of staying on the O2.  He was unable to transfer supine to sit with the [facility] flat (as his bed at home is) and became so exhausted froom the effort that he was unable to continue with the evaluation.  He assures me that his friend could assist him in and out of bed initailly at home...friend agrees.  We will complete the evaluation tomorrow in hopes of determining a discharge disposition, but I fear that this pt will only agree to going home at d/c.             PT Assessment  Patient needs continued PT services    Follow Up Recommendations  Home health PT;SNF;Supervision/Assistance - 24 hour (to be determined, but pt will refuse SNF)    Does the patient have the potential to tolerate intense rehabilitation      Barriers to Discharge Inaccessible home environment has 4 steps at home to front entrance    Equipment Recommendations       Recommendations for Other Services     Frequency Min 3X/week    Precautions / Restrictions Precautions Precautions: Fall Restrictions Weight Bearing Restrictions: No   Pertinent Vitals/Pain       Mobility  Bed Mobility Bed Mobility: Supine to Sit Supine to Sit: With rails;HOB flat;2: Max assist Details for  Bed Mobility Assistance: pt was unable to get to sitting at EOB as he was totally exhausted from the effort of trying to sit.  We had to stop PT tx for the day.    Exercises     PT Diagnosis: Difficulty walking;Generalized weakness  PT Problem List: Decreased strength;Decreased activity tolerance;Decreased mobility;Decreased knowledge of use of DME;Decreased safety awareness;Cardiopulmonary status limiting activity;Obesity PT Treatment Interventions: Gait training;Functional mobility training;Stair training;Therapeutic activities;Patient/family education;Therapeutic exercise   PT Goals Acute Rehab PT Goals PT Goal Formulation: With patient Time For Goal Achievement: 09/17/12 Potential to Achieve Goals: Fair Pt will go Supine/Side to Sit: with min assist;with HOB not 0 degrees (comment degree) PT Goal: Supine/Side to Sit - Progress: Goal set today Pt will go Sit to Supine/Side: with min assist;with HOB not 0 degrees (comment degree) PT Goal: Sit to Supine/Side - Progress: Goal set today Pt will go Sit to Stand: with supervision;with upper extremity assist PT Goal: Sit to Stand - Progress: Goal set today Pt will go Stand to Sit: with supervision;with upper extremity assist PT Goal: Stand to Sit - Progress: Goal set today Pt will Ambulate: 16 - 50 feet;with supervision;with least restrictive assistive device PT Goal: Ambulate - Progress: Goal set today Pt will Go Up / Down Stairs: 3-5 stairs;with min assist;with rail(s) PT Goal: Up/Down Stairs - Progress: Goal set today  Visit Information  Last PT Received On: 09/03/12  Subjective Data  Subjective: I'm not going to no nursing home Patient Stated Goal: return home   Prior Functioning  Home Living Lives With: Friend(s) Available Help at Discharge: Friend(s);Available 24 hours/day Type of Home: House Home Access: Stairs to enter [employer] of Steps: 4 Entrance Stairs-Rails: Right;Left;Can reach both Home Layout: One  level Bathroom Shower/Tub: Engineer, manufacturing systems: Standard Home Adaptive Equipment: Shower chair with back;Walker - rolling Prior Function Level of Independence: Independent Able to Take Stairs?: Yes Driving: Yes Vocation: Full time employment Communication Communication: No difficulties    Cognition  Cognition Overall Cognitive Status: Appears within functional limits for tasks assessed/performed Arousal/Alertness: Awake/alert Orientation Level: Appears intact for tasks assessed Behavior During Session: [facility] for tasks performed Cognition - Other Comments: There is some question in my mind as to whether pt is mildly confused...he would occasionally talk about seemingly nonsensical things...he also has a totally unrealistic idea as to how weak he is and that just simply being at home will not cause his functtion to automatically return to baseline    Extremity/Trunk Assessment Right Lower Extremity Assessment RLE ROM/Strength/Tone: Deficits RLE ROM/Strength/Tone Deficits: on MMT, strength is 3/5 at hips, 4/5 at quadriceps RLE Sensation: History of peripheral neuropathy Left Lower Extremity Assessment LLE ROM/Strength/Tone: Deficits LLE ROM/Strength/Tone Deficits: hip strength is 3/5, quadriceps strength =4/5 LLE Sensation: History of peripheral neuropathy Trunk Assessment Trunk Assessment: Normal   Balance Balance Balance Assessed: No  End of Session PT - End of Session Activity Tolerance: Patient limited by fatigue Patient left: in bed;with call bell/phone within reach;with family/visitor present  GP     Konrad Penta 09/03/2012, 10:33 AM

## 2012-09-03 NOTE — Progress Notes (Signed)
ANTICOAGULATION CONSULT NOTE  Pharmacy Consult for Warfarin Indication: Afib  No Known Allergies  Patient Measurements: Height: 5' 7.5" (171.5 cm) Weight: 238 lb 1.6 oz (108 kg) IBW/kg (Calculated) : 67.25 Body mass index is 36.72 kg/(m^2).  Vital Signs: Temp: 97.6 F (36.4 C) (03/27 0400) Temp src: Axillary (03/27 0400) BP: 173/72 mmHg (03/27 0500) Pulse Rate: 78 (03/27 0100)  Labs:  Recent Labs  09/01/12 0428 09/01/12 1448 09/02/12 0354 09/03/12 0425  HGB  --  8.4* 9.1*  --   HCT  --  26.0* 28.6*  --   PLT  --  200 256  --   LABPROT 22.9*  --  23.5* 20.1*  INR 2.13*  --  2.20* 1.78*  CREATININE 1.30  --   --   --    Estimated Creatinine Clearance: 63.4 ml/min (by C-G formula based on Cr of 1.3).  Medical History: Past Medical History  Diagnosis Date  . Coronary artery disease   . Hypertension   . Cancer   . Pneumonia   . CHF (congestive heart failure)   . High cholesterol   . Diabetes mellitus   . Hepatitis   . Renal disorder     kidney cancer  . LBBB (left bundle branch block)   . Paroxysmal atrial fibrillation   . Sleep apnea    Medications:  Scheduled:  . antiseptic oral rinse  15 mL Mouth Rinse q12n4p  . aspirin EC  325 mg Oral Daily  . chlorhexidine  15 mL Mouth Rinse BID  . diltiazem  120 mg Oral Q12H  . ipratropium  0.5 mg Nebulization Q6H  . iron polysaccharides  150 mg Oral BID  . levalbuterol  0.63 mg Nebulization Q6H  . methadone  5 mg Oral Q12H  . metoprolol tartrate  50 mg Oral BID  . nitroGLYCERIN  1 inch Topical Daily  . pantoprazole  40 mg Oral Daily  . PARoxetine  20 mg Oral Daily  . piperacillin-tazobactam (ZOSYN)  IV  3.375 g Intravenous Q8H  . sodium chloride  10-40 mL Intracatheter Q12H  . vancomycin  1,000 mg Intravenous Q12H  . [COMPLETED] warfarin  2.5 mg Oral ONCE-1800  . Warfarin - Pharmacist Dosing Inpatient   Does not apply Q24H  . [DISCONTINUED] pantoprazole (PROTONIX) IV  40 mg Intravenous Q24H    Assessment: [name] with new onset Afib started on warfarin.   INR is now below therapeutic range today.  No bleeding noted.   Goal of Therapy:  INR 2-3   Plan:  Increase Coumadin 5mg  po x 1 dose today PT/INR daily *Recommend decrease aspirin to 81mg  with therapeutic INR  Carrina Schoenberger, [facility] Riding 09/03/2012,8:08 AM

## 2012-09-03 NOTE — Progress Notes (Signed)
Report called to C.Alston,RN. Patient transferred to 316.

## 2012-09-03 NOTE — Progress Notes (Signed)
[id number] 

## 2012-09-04 ENCOUNTER — Inpatient Hospital Stay (HOSPITAL_COMMUNITY): Payer: Medicare Other

## 2012-09-04 LAB — BASIC METABOLIC PANEL
Calcium: 8.9 mg/dL (ref 8.4–10.5)
GFR calc Af Amer: 71 mL/min — ABNORMAL LOW (ref >90)
GFR calc non Af Amer: 61 mL/min — ABNORMAL LOW (ref >90)
Glucose, Bld: 127 mg/dL — ABNORMAL HIGH (ref 70–99)
Potassium: 3.5 mEq/L (ref 3.5–5.1)
Sodium: 140 mEq/L (ref 135–145)

## 2012-09-04 LAB — GLUCOSE, CAPILLARY
Glucose-Capillary: 139 mg/dL — ABNORMAL HIGH (ref 70–99)
Glucose-Capillary: 144 mg/dL — ABNORMAL HIGH (ref 70–99)
Glucose-Capillary: 160 mg/dL — ABNORMAL HIGH (ref 70–99)

## 2012-09-04 LAB — CBC
MCH: 24.1 pg — ABNORMAL LOW (ref 26.0–34.0)
MCHC: 31.9 g/dL (ref 30.0–36.0)
Platelets: 319 10*3/uL (ref 150–400)
RDW: 16.6 % — ABNORMAL HIGH (ref 11.5–15.5)

## 2012-09-04 LAB — PROTIME-INR
INR: 1.69 — ABNORMAL HIGH (ref 0.00–1.49)
Prothrombin Time: 19.3 seconds — ABNORMAL HIGH (ref 11.6–15.2)

## 2012-09-04 MED ORDER — WARFARIN SODIUM 7.5 MG PO TABS
7.5000 mg | ORAL_TABLET | Freq: Once | ORAL | Status: AC
  Administered 2012-09-04: 7.5 mg via ORAL
  Filled 2012-09-04: qty 1

## 2012-09-04 MED ORDER — TAMSULOSIN HCL 0.4 MG PO CAPS
0.4000 mg | ORAL_CAPSULE | Freq: Every day | ORAL | Status: DC
  Administered 2012-09-04 – 2012-09-06 (×3): 0.4 mg via ORAL
  Filled 2012-09-04 (×3): qty 1

## 2012-09-04 NOTE — Progress Notes (Signed)
[id number] 

## 2012-09-04 NOTE — Progress Notes (Signed)
ANTICOAGULATION CONSULT NOTE  Pharmacy Consult for Warfarin Indication: Afib  No Known Allergies  Patient Measurements: Height: 5' 7.5" (171.5 cm) Weight: 240 lb 6.4 oz (109.045 kg) IBW/kg (Calculated) : 67.25 Body mass index is 37.07 kg/(m^2).  Vital Signs: Temp: 97.4 F (36.3 C) (03/28 0417) Temp src: Oral (03/28 0417) BP: 153/90 mmHg (03/28 0417) Pulse Rate: 73 (03/28 0417)  Labs:  Recent Labs  09/02/12 0354 09/03/12 0425 09/03/12 0530 09/04/12 0509  HGB 9.1*  --  9.4* 9.4*  HCT 28.6*  --  29.9* 29.5*  PLT 256  --  341 319  LABPROT 23.5* 20.1*  --  19.3*  INR 2.20* 1.78*  --  1.69*  CREATININE  --   --   --  1.18   Estimated Creatinine Clearance: 70.2 ml/min (by C-G formula based on Cr of 1.18).  Medical History: Past Medical History  Diagnosis Date  . Coronary artery disease   . Hypertension   . Cancer   . Pneumonia   . CHF (congestive heart failure)   . High cholesterol   . Diabetes mellitus   . Hepatitis   . Renal disorder     kidney cancer  . LBBB (left bundle branch block)   . Paroxysmal atrial fibrillation   . Sleep apnea    Medications:  Scheduled:  . antiseptic oral rinse  15 mL Mouth Rinse q12n4p  . aspirin EC  325 mg Oral Daily  . chlorhexidine  15 mL Mouth Rinse BID  . cloNIDine  0.1 mg Oral TID  . diltiazem  120 mg Oral Q12H  . ipratropium  0.5 mg Nebulization Q6H  . iron polysaccharides  150 mg Oral BID  . levalbuterol  0.63 mg Nebulization Q6H  . methadone  5 mg Oral Q12H  . metoprolol tartrate  50 mg Oral BID  . nitroGLYCERIN  1 inch Topical Daily  . pantoprazole  40 mg Oral Daily  . PARoxetine  20 mg Oral Daily  . piperacillin-tazobactam (ZOSYN)  IV  3.375 g Intravenous Q8H  . sodium chloride  10-40 mL Intracatheter Q12H  . vancomycin  1,000 mg Intravenous Q12H  . warfarin  5 mg Oral ONCE-1800  . Warfarin - Pharmacist Dosing Inpatient   Does not apply Q24H  . [DISCONTINUED] cloNIDine  0.1 mg Oral TID   Assessment: [name]  with new onset Afib started on warfarin.   INR is now below therapeutic range today.  No bleeding noted.   Goal of Therapy:  INR 2-3   Plan:  Increase Coumadin 7.5mg  po x 1 dose today PT/INR daily *Recommend decrease aspirin to 81mg  with therapeutic INR  Margo Aye, Danijah Noh A 09/04/2012,8:38 AM

## 2012-09-04 NOTE — Progress Notes (Signed)
Pt started on BiPAP 10/5 sat only 86 5lpm O2 titrated in to mask Sat rising, will check again later.

## 2012-09-04 NOTE — Progress Notes (Signed)
[name], [name]                ACCOUNT NO.:  [id number]  MEDICAL RECORD NO.:  [id number]  LOCATION:  A316                          FACILITY:  [facility]  PHYSICIAN:  Melvyn Novas, MDDATE OF BIRTH:  11/03/1940  DATE OF PROCEDURE: DATE OF DISCHARGE:                                PROGRESS NOTE   PROBLEMS: 1. Paroxysmal atrial fibrillation, currently in sinus rhythm with     anticoagulation, on Coumadin.  INR 1.78. 2. Hypertension, inadequately controlled since today. 3. Coronary artery disease, status post multiple stents. 4. Hyperlipidemia. 5. Mild dementia. 6. Hyperlipidemia.  The patient currently in sinus rhythm.  VITAL SIGNS:  VITAL SIGNS:  Blood pressure 192/88, up from previous, temperature 98.6, pulse 81 and regular, respiratory rate 24.  Hemoglobin 9.1, stable. LUNGS:  Show diminished breath sounds at the bases.  Prolonged expiratory phase.  Scattered rhonchi.  No rales audible. HEART:  Regular rhythm, no S3-S4.  No heaves, thrills, or rubs. ABDOMEN:  Soft, nontender.  Bowel sounds normoactive.  No guarding, rebound, mass, or megaly.  The patient currently is on vancomycin and Zosyn for possible hospital-acquired aspiration pneumonia.  PLAN:  Right now is to check chest x-ray to see resolution.  Continue Coumadin.  Reluctant to add ARBs or ACEs with renal impairment. Increase Cardizem CD to 300 per day.  Continue Lopressor 50 p.o. b.i.d., and add clonidine 0.1 b.i.d. for blood pressure control.     Melvyn Novas, MD     RMD/MEDQ  D:  09/03/2012  T:  09/04/2012  Job:  [id number]

## 2012-09-04 NOTE — Progress Notes (Signed)
ANTIBIOTIC CONSULT NOTE  Pharmacy Consult for Vancomycin & Zosyn Indication: pneumonia  No Known Allergies  Patient Measurements: Height: 5' 7.5" (171.5 cm) Weight: 240 lb 6.4 oz (109.045 kg) IBW/kg (Calculated) : 67.25  Vital Signs: Temp: 97.4 F (36.3 C) (03/28 0417) Temp src: Oral (03/28 0417) BP: 153/90 mmHg (03/28 0417) Pulse Rate: 73 (03/28 0417) Intake/Output from previous day: 03/27 0701 - 03/28 0700 In: 480 [P.O.:360; I.V.:120] Out: 1125 [Urine:1125] Intake/Output from this shift:    Labs:  Recent Labs  09/02/12 0354 09/03/12 0530 09/04/12 0509  WBC 9.8 8.6 7.2  HGB 9.1* 9.4* 9.4*  PLT 256 341 319  CREATININE  --   --  1.18   Estimated Creatinine Clearance: 70.2 ml/min (by C-G formula based on Cr of 1.18).  Recent Labs  09/02/12 0001  VANCORANDOM 18.8    Microbiology: Recent Results (from the past 720 hour(s))  MRSA PCR SCREENING     Status: None   Collection Time    08/21/12  6:45 PM      Result Value Range Status   MRSA by PCR NEGATIVE  NEGATIVE Final   Comment:            The GeneXpert MRSA Assay (FDA     approved for NASAL specimens     only), is one component of a     comprehensive MRSA colonization     surveillance program. It is not     intended to diagnose MRSA     infection nor to guide or     monitor treatment for     MRSA infections.  URINE CULTURE     Status: None   Collection Time    08/25/12  4:12 PM      Result Value Range Status   Specimen Description URINE, CATHETERIZED   Final   Special Requests NONE   Final   Culture  Setup Time 08/26/2012 03:22   Final   Colony Count NO GROWTH   Final   Culture NO GROWTH   Final   Report Status 08/26/2012 FINAL   Final  CULTURE, BLOOD (ROUTINE X 2)     Status: None   Collection Time    08/25/12  6:45 PM      Result Value Range Status   Specimen Description BLOOD LEFT ANTECUBITAL   Final   Special Requests BOTTLES DRAWN AEROBIC AND ANAEROBIC 8CC EACH   Final   Culture NO GROWTH 5  DAYS   Final   Report Status 08/30/2012 FINAL   Final  CULTURE, BLOOD (ROUTINE X 2)     Status: None   Collection Time    08/25/12  6:50 PM      Result Value Range Status   Specimen Description BLOOD LEFT HAND   Final   Special Requests BOTTLES DRAWN AEROBIC AND ANAEROBIC 8CC EACH   Final   Culture NO GROWTH 5 DAYS   Final   Report Status 08/30/2012 FINAL   Final  URINE CULTURE     Status: None   Collection Time    08/26/12  7:27 AM      Result Value Range Status   Specimen Description URINE, CLEAN CATCH   Final   Special Requests NONE   Final   Culture  Setup Time 08/27/2012 02:59   Final   Colony Count NO GROWTH   Final   Culture NO GROWTH   Final   Report Status 08/28/2012 FINAL   Final   Medical History: Past Medical History  Diagnosis Date  . Coronary artery disease   . Hypertension   . Cancer   . Pneumonia   . CHF (congestive heart failure)   . High cholesterol   . Diabetes mellitus   . Hepatitis   . Renal disorder     kidney cancer  . LBBB (left bundle branch block)   . Paroxysmal atrial fibrillation   . Sleep apnea    Medications:  Scheduled:  . antiseptic oral rinse  15 mL Mouth Rinse q12n4p  . aspirin EC  325 mg Oral Daily  . chlorhexidine  15 mL Mouth Rinse BID  . cloNIDine  0.1 mg Oral TID  . diltiazem  120 mg Oral Q12H  . ipratropium  0.5 mg Nebulization Q6H  . iron polysaccharides  150 mg Oral BID  . levalbuterol  0.63 mg Nebulization Q6H  . methadone  5 mg Oral Q12H  . metoprolol tartrate  50 mg Oral BID  . nitroGLYCERIN  1 inch Topical Daily  . pantoprazole  40 mg Oral Daily  . PARoxetine  20 mg Oral Daily  . piperacillin-tazobactam (ZOSYN)  IV  3.375 g Intravenous Q8H  . sodium chloride  10-40 mL Intracatheter Q12H  . vancomycin  1,000 mg Intravenous Q12H  . warfarin  5 mg Oral ONCE-1800  . warfarin  7.5 mg Oral Once  . Warfarin - Pharmacist Dosing Inpatient   Does not apply Q24H  . [DISCONTINUED] cloNIDine  0.1 mg Oral TID    Assessment: [name] who has been hospitalized since 3/14 for Afib.  He declined prior to discharge on 3/18 & was noted to be hypoxic with new infiltrate per CXR.  He is currently on day#10 broad spectrum antibiotics for HCAP/aspiration PNA.  Acute on chronic renal failure has resolved.  All culture data has been negative. Pt is clinically much improved.   Vanc 3/19>> Zosyn 3/19>>   Goal of Therapy:  Vancomycin trough level 15-20 mcg/ml  Plan:  1) Continue Zosyn 3.375gm IV Q8h to be infused over 4hrs 2) Continue Vancomycin 1000mg  IV every 12 hours 3) Weekly Vancomycin trough while on Vancomycin 4) Monitor renal function, cx data, and patient progress 5) Duration of therapy per MD- recommend d/c 6) Change Protonix to po per policy (see below)  The patient is receiving Protonix by the intravenous route.  Based on criteria approved by the Pharmacy and Therapeutics Committee and the Medical Executive Committee, the medication is being converted to the equivalent oral dose form.  These criteria include: -No Active GI bleeding -Able to tolerate diet of full liquids (or better) or tube feeding OR able to tolerate other medications by the oral or enteral route  If you have any questions about this conversion, please contact the Pharmacy Department (ext [phone]).  Thank you.  Wayland Denis, [facility] 09/04/2012 8:46 AM

## 2012-09-04 NOTE — Progress Notes (Signed)
Physical Therapy Treatment Patient Details Name: [name] MRN: [id number] DOB: 04/28/1941 Today's Date: 09/04/2012 Time: 7829-5621 PT Time Calculation (min): 45 min  PT Assessment / Plan / Recommendation Comments on Treatment Session  Pt was somewhat reluctant to work with me this morning due to fatigue.  He finally agreed when he realized that I would not be here tomorrow.  He is so deconditioned that he is unable to tolerate both ther ex and gait in the same session.  So only gait was worked on.    He required only min assist to sit at EOB with HOB elevated to the max.  He was then able to stand and ambulate with a walker 22' with SBA.  His O2 sat dropped to 87% with O2 at 4L/min but it rebounded to 90% within a few minutes.  Pt would benefit from HHPT however I am not sure he will be agreeable to this...he has his own way of doing things.    Follow Up Recommendations        Does the patient have the potential to tolerate intense rehabilitation     Barriers to Discharge        Equipment Recommendations  None recommended by PT    Recommendations for Other Services    Frequency     Plan Discharge plan remains appropriate;Frequency remains appropriate    Precautions / Restrictions     Pertinent Vitals/Pain     Mobility  Bed Mobility Bed Mobility: Supine to Sit;Sit to Supine;Sitting - Scoot to Edge of Bed Supine to Sit: 4: Min assist;HOB elevated Sitting - Scoot to [place] of Bed: 5: Supervision Sit to Supine: 4: Min guard;HOB elevated Transfers Transfers: Sit to Stand;Stand to Sit Sit to Stand: 6: Modified independent (Device/Increase time);From bed Stand to Sit: 6: Modified independent (Device/Increase time);To bed Ambulation/Gait Ambulation/Gait Assistance: 5: Supervision Ambulation Distance (Feet): 22 Feet Assistive device: Rolling walker Gait Pattern: Within Functional Limits Gait velocity: WNL General Gait Details: O2 was reised to 4L/min for gait...O2 sat was 87%  at end of gait and it took several minuted to get back to baseline Stairs: No Wheelchair Mobility Wheelchair Mobility: No    Exercises     PT Diagnosis:    PT Problem List:   PT Treatment Interventions:     PT Goals Acute Rehab PT Goals PT Goal: Supine/Side to Sit - Progress: Met PT Goal: Sit to Supine/Side - Progress: Met PT Goal: Sit to Stand - Progress: Met  Visit Information  Last PT Received On: 09/04/12    Subjective Data  Subjective: Why don't you come back tomorrow instead   Cognition       Balance  Balance Balance Assessed:  (WNL by functional observation)  End of Session PT - End of Session Equipment Utilized During Treatment: Gait belt Activity Tolerance: Patient limited by fatigue Patient left: in bed;with call bell/phone within reach;with bed alarm set;with family/visitor present Nurse Communication: Mobility status   GP     Konrad Penta 09/04/2012, 9:51 AM

## 2012-09-04 NOTE — Progress Notes (Signed)
He has been moved from the intensive care unit now and seems to be doing fairly well as far as his breathing is concerned. He still uses BiPAP at night sometimes and he does have a significant history of COPD. He is still somewhat confused  Since he has improved from a respiratory point of view I am going to plan to sign off. Thank you for allowing me to see him with you

## 2012-09-05 LAB — GLUCOSE, CAPILLARY
Glucose-Capillary: 127 mg/dL — ABNORMAL HIGH (ref 70–99)
Glucose-Capillary: 123 mg/dL — ABNORMAL HIGH (ref 70–99)
Glucose-Capillary: 163 mg/dL — ABNORMAL HIGH (ref 70–99)

## 2012-09-05 LAB — BASIC METABOLIC PANEL
Chloride: 98 mEq/L (ref 96–112)
GFR calc Af Amer: 68 mL/min — ABNORMAL LOW (ref >90)
Potassium: 3.4 mEq/L — ABNORMAL LOW (ref 3.5–5.1)

## 2012-09-05 LAB — PROTIME-INR
INR: 1.7 — ABNORMAL HIGH (ref 0.00–1.49)
Prothrombin Time: 19.4 seconds — ABNORMAL HIGH (ref 11.6–15.2)

## 2012-09-05 MED ORDER — WARFARIN SODIUM 7.5 MG PO TABS
7.5000 mg | ORAL_TABLET | Freq: Once | ORAL | Status: AC
  Administered 2012-09-05: 7.5 mg via ORAL
  Filled 2012-09-05: qty 1

## 2012-09-05 MED ORDER — POTASSIUM CHLORIDE CRYS ER 20 MEQ PO TBCR
20.0000 meq | EXTENDED_RELEASE_TABLET | Freq: Every day | ORAL | Status: AC
  Administered 2012-09-05 – 2012-09-06 (×2): 20 meq via ORAL
  Filled 2012-09-05 (×2): qty 1

## 2012-09-05 NOTE — Progress Notes (Signed)
ANTICOAGULATION CONSULT NOTE  Pharmacy Consult for Warfarin Indication: Afib  No Known Allergies  Patient Measurements: Height: 5' 7.5" (171.5 cm) Weight: 241 lb 3.2 oz (109.408 kg) IBW/kg (Calculated) : 67.25 Body mass index is 37.2 kg/(m^2).  Vital Signs: Temp: 97.8 F (36.6 C) (03/29 0708) BP: 143/80 mmHg (03/29 0708) Pulse Rate: 74 (03/29 0751)  Labs:  Recent Labs  09/03/12 0425 09/03/12 0530 09/04/12 0509 09/05/12 0701  HGB  --  9.4* 9.4*  --   HCT  --  29.9* 29.5*  --   PLT  --  341 319  --   LABPROT 20.1*  --  19.3* 19.4*  INR 1.78*  --  1.69* 1.70*  CREATININE  --   --  1.18 1.22   Estimated Creatinine Clearance: 68 ml/min (by C-G formula based on Cr of 1.22).  Medical History: Past Medical History  Diagnosis Date  . Coronary artery disease   . Hypertension   . Cancer   . Pneumonia   . CHF (congestive heart failure)   . High cholesterol   . Diabetes mellitus   . Hepatitis   . Renal disorder     kidney cancer  . LBBB (left bundle branch block)   . Paroxysmal atrial fibrillation   . Sleep apnea    Medications:  Scheduled:  . antiseptic oral rinse  15 mL Mouth Rinse q12n4p  . aspirin EC  325 mg Oral Daily  . chlorhexidine  15 mL Mouth Rinse BID  . cloNIDine  0.1 mg Oral TID  . diltiazem  120 mg Oral Q12H  . ipratropium  0.5 mg Nebulization Q6H  . iron polysaccharides  150 mg Oral BID  . levalbuterol  0.63 mg Nebulization Q6H  . methadone  5 mg Oral Q12H  . metoprolol tartrate  50 mg Oral BID  . nitroGLYCERIN  1 inch Topical Daily  . pantoprazole  40 mg Oral Daily  . PARoxetine  20 mg Oral Daily  . piperacillin-tazobactam (ZOSYN)  IV  3.375 g Intravenous Q8H  . sodium chloride  10-40 mL Intracatheter Q12H  . tamsulosin  0.4 mg Oral QPC supper  . vancomycin  1,000 mg Intravenous Q12H  . [COMPLETED] warfarin  7.5 mg Oral Once  . Warfarin - Pharmacist Dosing Inpatient   Does not apply Q24H   Assessment: 73 yo M with new onset Afib started  on warfarin.   INR is now below therapeutic range today.  No bleeding noted.   Goal of Therapy:  INR 2-3   Plan:  Coumadin 7.5mg  po x 1 dose today PT/INR daily *Recommend decrease aspirin to 81mg  with therapeutic INR  Valrie Hart A 09/05/2012,9:09 AM

## 2012-09-05 NOTE — Progress Notes (Signed)
[id number] 

## 2012-09-05 NOTE — Plan of Care (Signed)
Problem: Phase III Progression Outcomes Goal: Sinus rhythm established or heart rate < 100 at rest Outcome: Adequate for Discharge HR < 100

## 2012-09-05 NOTE — Progress Notes (Signed)
[name], [name]                ACCOUNT NO.:  [id number]  MEDICAL RECORD NO.:  [id number]  LOCATION:  A316                          FACILITY:  [facility]  PHYSICIAN:  Melvyn Novas, MDDATE OF BIRTH:  03-27-1941  DATE OF PROCEDURE: DATE OF DISCHARGE:                                PROGRESS NOTE   SUBJECTIVE:  The patient had questionable aspiration pneumonia, treated with vancomycin and Zosyn now for 9 days.  No respiratory distress.  Has COPD.  Has chronic paroxysmal AFib, currently in sinus rhythm, but currently controlled on Coumadin per pharmacy protocol, hypertension, hyperlipidemia, coronary artery disease.  Has hypokalemia today, 3.4. Ambulating.  PHYSICAL EXAMINATION:  VITAL SIGNS:  Blood pressure is 143/80, temperature 97.8, pulse 74 and regular, respiratory rate is 12. LUNGS:  Clear.  No rales, wheeze, or rhonchi.  Some diminished breath sounds at the bases. HEART:  Regular rhythm.  No S3, S4.  LABORATORY DATA:  Creatinine after renal failure has now returned to baseline of 1.22.  INR 1.70.  Potassium 3.4, we will give K-Dur 20 mEq p.o. daily.  PLAN:  The plan right now is to continue antibiotics for 2 more days till discharge.  Continue nebulizer therapy at home, BiPAP at home, Coumadin 7.5 mg daily, diltiazem 120 q.12, and Lopressor 50 q.12, and methadone 5 q.12.     Melvyn Novas, MD     RMD/MEDQ  D:  09/05/2012  T:  09/05/2012  Job:  [id number]

## 2012-09-06 LAB — BASIC METABOLIC PANEL
Calcium: 8.6 mg/dL (ref 8.4–10.5)
Creatinine, Ser: 1.22 mg/dL (ref 0.50–1.35)
GFR calc non Af Amer: 59 mL/min — ABNORMAL LOW (ref >90)
Glucose, Bld: 109 mg/dL — ABNORMAL HIGH (ref 70–99)
Sodium: 140 mEq/L (ref 135–145)

## 2012-09-06 LAB — GLUCOSE, CAPILLARY
Glucose-Capillary: 129 mg/dL — ABNORMAL HIGH (ref 70–99)
Glucose-Capillary: 183 mg/dL — ABNORMAL HIGH (ref 70–99)
Glucose-Capillary: 208 mg/dL — ABNORMAL HIGH (ref 70–99)

## 2012-09-06 MED ORDER — WARFARIN SODIUM 5 MG PO TABS
5.0000 mg | ORAL_TABLET | ORAL | Status: DC
  Administered 2012-09-06: 5 mg via ORAL
  Filled 2012-09-06: qty 1

## 2012-09-06 NOTE — Progress Notes (Signed)
ANTICOAGULATION CONSULT NOTE  Pharmacy Consult for Warfarin Indication: Afib  No Known Allergies  Patient Measurements: Height: 5' 7.5" (171.5 cm) Weight: 245 lb (111.131 kg) IBW/kg (Calculated) : 67.25 Body mass index is 37.78 kg/(m^2).  Vital Signs: Temp: 97.7 F (36.5 C) (03/30 0629) Temp src: Oral (03/30 0629) BP: 157/66 mmHg (03/30 0629) Pulse Rate: 70 (03/30 0629)  Labs:  Recent Labs  09/04/12 0509 09/05/12 0701 09/06/12 0654  HGB 9.4*  --   --   HCT 29.5*  --   --   PLT 319  --   --   LABPROT 19.3* 19.4* 21.7*  INR 1.69* 1.70* 1.98*  CREATININE 1.18 1.22 1.22   Estimated Creatinine Clearance: 68.5 ml/min (by C-G formula based on Cr of 1.22).  Medical History: Past Medical History  Diagnosis Date  . Coronary artery disease   . Hypertension   . Cancer   . Pneumonia   . CHF (congestive heart failure)   . High cholesterol   . Diabetes mellitus   . Hepatitis   . Renal disorder     kidney cancer  . LBBB (left bundle branch block)   . Paroxysmal atrial fibrillation   . Sleep apnea    Medications:  Scheduled:  . antiseptic oral rinse  15 mL Mouth Rinse q12n4p  . aspirin EC  325 mg Oral Daily  . chlorhexidine  15 mL Mouth Rinse BID  . cloNIDine  0.1 mg Oral TID  . diltiazem  120 mg Oral Q12H  . ipratropium  0.5 mg Nebulization Q6H  . iron polysaccharides  150 mg Oral BID  . levalbuterol  0.63 mg Nebulization Q6H  . methadone  5 mg Oral Q12H  . metoprolol tartrate  50 mg Oral BID  . nitroGLYCERIN  1 inch Topical Daily  . pantoprazole  40 mg Oral Daily  . PARoxetine  20 mg Oral Daily  . piperacillin-tazobactam (ZOSYN)  IV  3.375 g Intravenous Q8H  . [COMPLETED] potassium chloride  20 mEq Oral Daily  . sodium chloride  10-40 mL Intracatheter Q12H  . tamsulosin  0.4 mg Oral QPC supper  . vancomycin  1,000 mg Intravenous Q12H  . [COMPLETED] warfarin  7.5 mg Oral Once  . Warfarin - Pharmacist Dosing Inpatient   Does not apply Q24H   Assessment: 72  yo M with new onset Afib started on warfarin.   INR is now slightly below therapeutic range but rising.  No bleeding noted.   Goal of Therapy:  INR 2-3   Plan:  Coumadin 5mg  po daily at 1600 PT/INR daily  Margo Aye, Muzamil Harker A 09/06/2012,9:52 AM

## 2012-09-06 NOTE — Progress Notes (Signed)
[id number] 

## 2012-09-06 NOTE — Progress Notes (Signed)
NAMEMAGDALENO, LORTIE                ACCOUNT NO.:  [id number]  MEDICAL RECORD NO.:  [id number]  LOCATION:  A316                          FACILITY:  [facility]  PHYSICIAN:  Melvyn Novas, MDDATE OF BIRTH:  January 20, 1941  DATE OF PROCEDURE: DATE OF DISCHARGE:                                PROGRESS NOTE   The patient had paroxysmal atrial fibrillation, converted to sinus rhythm, currently anticoagulated, coronary artery disease, multiple stents, normal LV function, COPD.  He vomited and aspirated, had an infiltrate, was placed on vanc and Zosyn.  Today is day 7.  Blood pressure well controlled.  Lipids well controlled.  He has anemia of chronic disease.  Hemoglobin 9.4.  He had acute renal failure during these episodes and now has resolved, and reverted to baseline creatinine of 1.22.  The patient has no chest pain, no cough, no dyspnea.  Blood pressure 157/66, temperature 97.7, pulse 70 and regular, respiratory rate is 20.  Planning discharge tomorrow.  The patient required nebulizer therapy.  The patient is totally illiterate, so will need help with medicines, we will organize this with home health care to color code bottles for varying medicines.  Chest x-ray just reveals stable infiltrate.  We will do CBC to see if there is any persistent leukocytosis or if there is resolution of pneumonia.     Melvyn Novas, MD     RMD/MEDQ  D:  09/06/2012  T:  09/06/2012  Job:  [id number]

## 2012-09-07 LAB — CBC WITH DIFFERENTIAL/PLATELET
Basophils Relative: 1 % (ref 0–1)
Eosinophils Absolute: 0.5 10*3/uL (ref 0.0–0.7)
Eosinophils Relative: 6 % — ABNORMAL HIGH (ref 0–5)
HCT: 25.9 % — ABNORMAL LOW (ref 39.0–52.0)
Hemoglobin: 8 g/dL — ABNORMAL LOW (ref 13.0–17.0)
Lymphs Abs: 1.1 10*3/uL (ref 0.7–4.0)
MCH: 24.1 pg — ABNORMAL LOW (ref 26.0–34.0)
MCHC: 30.9 g/dL (ref 30.0–36.0)
MCV: 78 fL (ref 78.0–100.0)
Monocytes Absolute: 0.7 10*3/uL (ref 0.1–1.0)
Monocytes Relative: 8 % (ref 3–12)
RBC: 3.32 MIL/uL — ABNORMAL LOW (ref 4.22–5.81)

## 2012-09-07 LAB — GLUCOSE, CAPILLARY
Glucose-Capillary: 122 mg/dL — ABNORMAL HIGH (ref 70–99)
Glucose-Capillary: 154 mg/dL — ABNORMAL HIGH (ref 70–99)

## 2012-09-07 LAB — PROTIME-INR: Prothrombin Time: 21 seconds — ABNORMAL HIGH (ref 11.6–15.2)

## 2012-09-07 MED ORDER — METOPROLOL TARTRATE 50 MG PO TABS: 50.0000 mg | ORAL_TABLET | Freq: Two times a day (BID) | ORAL | Status: DC

## 2012-09-07 MED ORDER — WARFARIN SODIUM 7.5 MG PO TABS: 7.5000 mg | ORAL_TABLET | ORAL | Status: DC

## 2012-09-07 MED ORDER — POLYSACCHARIDE IRON COMPLEX 150 MG PO CAPS: 150.0000 mg | ORAL_CAPSULE | Freq: Two times a day (BID) | ORAL | Status: DC

## 2012-09-07 MED ORDER — METHADONE HCL 5 MG PO TABS: 5.0000 mg | ORAL_TABLET | Freq: Two times a day (BID) | ORAL | Status: DC

## 2012-09-07 MED ORDER — TAMSULOSIN HCL 0.4 MG PO CAPS: 0.4000 mg | ORAL_CAPSULE | Freq: Every day | ORAL | Status: DC

## 2012-09-07 MED ORDER — DILTIAZEM HCL ER 120 MG PO CP12: 120.0000 mg | ORAL_CAPSULE | Freq: Two times a day (BID) | ORAL | Status: DC

## 2012-09-07 MED ORDER — CLONIDINE HCL 0.1 MG PO TABS: 0.2000 mg | ORAL_TABLET | Freq: Three times a day (TID) | ORAL | Status: DC

## 2012-09-07 MED ORDER — INSULIN GLARGINE 100 UNIT/ML ~~LOC~~ SOLN: 35.0000 [IU] | Freq: Every day | SUBCUTANEOUS | Status: DC

## 2012-09-07 MED ORDER — IPRATROPIUM BROMIDE 0.02 % IN SOLN: 0.5000 mg | Freq: Four times a day (QID) | RESPIRATORY_TRACT | Status: DC

## 2012-09-07 NOTE — Progress Notes (Signed)
ANTICOAGULATION CONSULT NOTE  Pharmacy Consult for Warfarin Indication: Afib  No Known Allergies  Patient Measurements: Height: 5' 7.5" (171.5 cm) Weight: 242 lb 8.1 oz (110 kg) IBW/kg (Calculated) : 67.25 Body mass index is 37.4 kg/(m^2).  Vital Signs: Temp: 97.3 F (36.3 C) (03/31 0539) Temp src: Axillary (03/31 0539) BP: 150/77 mmHg (03/31 0539) Pulse Rate: 72 (03/31 0539)  Labs:  Recent Labs  09/05/12 0701 09/06/12 0654 09/07/12 0506  HGB  --   --  8.0*  HCT  --   --  25.9*  PLT  --   --  343  LABPROT 19.4* 21.7* 21.0*  INR 1.70* 1.98* 1.89*  CREATININE 1.22 1.22  --    Estimated Creatinine Clearance: 68.2 ml/min (by C-G formula based on Cr of 1.22).  Medical History: Past Medical History  Diagnosis Date  . Coronary artery disease   . Hypertension   . Cancer   . Pneumonia   . CHF (congestive heart failure)   . High cholesterol   . Diabetes mellitus   . Hepatitis   . Renal disorder     kidney cancer  . LBBB (left bundle branch block)   . Paroxysmal atrial fibrillation   . Sleep apnea    Medications:  Scheduled:  . antiseptic oral rinse  15 mL Mouth Rinse q12n4p  . aspirin EC  325 mg Oral Daily  . chlorhexidine  15 mL Mouth Rinse BID  . cloNIDine  0.1 mg Oral TID  . diltiazem  120 mg Oral Q12H  . ipratropium  0.5 mg Nebulization Q6H  . iron polysaccharides  150 mg Oral BID  . levalbuterol  0.63 mg Nebulization Q6H  . methadone  5 mg Oral Q12H  . metoprolol tartrate  50 mg Oral BID  . nitroGLYCERIN  1 inch Topical Daily  . pantoprazole  40 mg Oral Daily  . PARoxetine  20 mg Oral Daily  . piperacillin-tazobactam (ZOSYN)  IV  3.375 g Intravenous Q8H  . [COMPLETED] potassium chloride  20 mEq Oral Daily  . sodium chloride  10-40 mL Intracatheter Q12H  . tamsulosin  0.4 mg Oral QPC supper  . vancomycin  1,000 mg Intravenous Q12H  . warfarin  7.5 mg Oral Q24H  . Warfarin - Pharmacist Dosing Inpatient   Does not apply Q24H  . [DISCONTINUED]  warfarin  5 mg Oral Q24H   Assessment: [name] with new onset Afib started on warfarin.   INR is trending down and is below goal range.  No bleeding noted.   Goal of Therapy:  INR 2-3   Plan:  Increase Coumadin to 7.5mg  po daily at 1600 PT/INR daily  [name] A 09/07/2012,8:03 AM

## 2012-09-07 NOTE — Progress Notes (Deleted)
Pt requires O2 due to respiratory insufficiency with pneumonia and COPD.

## 2012-09-07 NOTE — Progress Notes (Signed)
Pt O2 sats 78% on RA at rest. Pt requires home O2 at 3l/[place] continuous for pneumonia and COPD.

## 2012-09-07 NOTE — Progress Notes (Signed)
ANTIBIOTIC CONSULT NOTE  Pharmacy Consult for Vancomycin & Zosyn Indication: pneumonia  No Known Allergies  Patient Measurements: Height: 5' 7.5" (171.5 cm) Weight: 242 lb 8.1 oz (110 kg) IBW/kg (Calculated) : 67.25  Vital Signs: Temp: 97.3 F (36.3 C) (03/31 0539) Temp src: Axillary (03/31 0539) BP: 150/77 mmHg (03/31 0539) Pulse Rate: 72 (03/31 0539) Intake/Output from previous day: 03/30 0701 - 03/31 0700 In: 2360 [P.O.:1570; I.V.:240; IV Piggyback:550] Out: 1450 [Urine:1450] Intake/Output from this shift:    Labs:  Recent Labs  09/05/12 0701 09/06/12 0654 09/07/12 0506  WBC  --   --  8.0  HGB  --   --  8.0*  PLT  --   --  343  CREATININE 1.22 1.22  --    Estimated Creatinine Clearance: 68.2 ml/min (by C-G formula based on Cr of 1.22). No results found for this basename: VANCOTROUGH, Leodis Binet, VANCORANDOM, GENTTROUGH, GENTPEAK, GENTRANDOM, TOBRATROUGH, TOBRAPEAK, TOBRARND, AMIKACINPEAK, AMIKACINTROU, AMIKACIN,  in the last 72 hours  Microbiology: Recent Results (from the past 720 hour(s))  MRSA PCR SCREENING     Status: None   Collection Time    08/21/12  6:45 PM      Result Value Range Status   MRSA by PCR NEGATIVE  NEGATIVE Final   Comment:            The GeneXpert MRSA Assay (FDA     approved for NASAL specimens     only), is one component of a     comprehensive MRSA colonization     surveillance program. It is not     intended to diagnose MRSA     infection nor to guide or     monitor treatment for     MRSA infections.  URINE CULTURE     Status: None   Collection Time    08/25/12  4:12 PM      Result Value Range Status   Specimen Description URINE, CATHETERIZED   Final   Special Requests NONE   Final   Culture  Setup Time 08/26/2012 03:22   Final   Colony Count NO GROWTH   Final   Culture NO GROWTH   Final   Report Status 08/26/2012 FINAL   Final  CULTURE, BLOOD (ROUTINE X 2)     Status: None   Collection Time    08/25/12  6:45 PM   Result Value Range Status   Specimen Description BLOOD LEFT ANTECUBITAL   Final   Special Requests BOTTLES DRAWN AEROBIC AND ANAEROBIC 8CC EACH   Final   Culture NO GROWTH 5 DAYS   Final   Report Status 08/30/2012 FINAL   Final  CULTURE, BLOOD (ROUTINE X 2)     Status: None   Collection Time    08/25/12  6:50 PM      Result Value Range Status   Specimen Description BLOOD LEFT HAND   Final   Special Requests BOTTLES DRAWN AEROBIC AND ANAEROBIC 8CC EACH   Final   Culture NO GROWTH 5 DAYS   Final   Report Status 08/30/2012 FINAL   Final  URINE CULTURE     Status: None   Collection Time    08/26/12  7:27 AM      Result Value Range Status   Specimen Description URINE, CLEAN CATCH   Final   Special Requests NONE   Final   Culture  Setup Time 08/27/2012 02:59   Final   Colony Count NO GROWTH   Final   Culture  NO GROWTH   Final   Report Status 08/28/2012 FINAL   Final   Medical History: Past Medical History  Diagnosis Date  . Coronary artery disease   . Hypertension   . Cancer   . Pneumonia   . CHF (congestive heart failure)   . High cholesterol   . Diabetes mellitus   . Hepatitis   . Renal disorder     kidney cancer  . LBBB (left bundle branch block)   . Paroxysmal atrial fibrillation   . Sleep apnea    Medications:  Scheduled:  . antiseptic oral rinse  15 mL Mouth Rinse q12n4p  . aspirin EC  325 mg Oral Daily  . chlorhexidine  15 mL Mouth Rinse BID  . cloNIDine  0.1 mg Oral TID  . diltiazem  120 mg Oral Q12H  . ipratropium  0.5 mg Nebulization Q6H  . iron polysaccharides  150 mg Oral BID  . levalbuterol  0.63 mg Nebulization Q6H  . methadone  5 mg Oral Q12H  . metoprolol tartrate  50 mg Oral BID  . nitroGLYCERIN  1 inch Topical Daily  . pantoprazole  40 mg Oral Daily  . PARoxetine  20 mg Oral Daily  . piperacillin-tazobactam (ZOSYN)  IV  3.375 g Intravenous Q8H  . [COMPLETED] potassium chloride  20 mEq Oral Daily  . sodium chloride  10-40 mL Intracatheter Q12H  .  tamsulosin  0.4 mg Oral QPC supper  . vancomycin  1,000 mg Intravenous Q12H  . warfarin  7.5 mg Oral Q24H  . Warfarin - Pharmacist Dosing Inpatient   Does not apply Q24H  . [DISCONTINUED] warfarin  5 mg Oral Q24H   Assessment: [name] who has been hospitalized since 3/14 for Afib.  He declined prior to discharge on 3/18 & was noted to be hypoxic with new infiltrate per CXR.  He is currently on day#13 broad spectrum antibiotics for HCAP/aspiration PNA.  Acute on chronic renal failure has resolved.  All culture data has been negative. Pt is clinically much improved.   Vanc 3/19>> Zosyn 3/19>>   Goal of Therapy:  Vancomycin trough level 15-20 mcg/ml  Plan:  1) Continue Zosyn 3.375gm IV Q8h to be infused over 4hrs 2) Continue Vancomycin 1000mg  IV every 12 hours 3) Weekly Vancomycin trough while on Vancomycin 4) Monitor renal function, cx data, and patient progress 5) Duration of therapy per MD- recommend d/c 6) Change Protonix to po per policy (see below)  The patient is receiving Protonix by the intravenous route.  Based on criteria approved by the Pharmacy and Therapeutics Committee and the Medical Executive Committee, the medication is being converted to the equivalent oral dose form.  These criteria include: -No Active GI bleeding -Able to tolerate diet of full liquids (or better) or tube feeding OR able to tolerate other medications by the oral or enteral route  If you have any questions about this conversion, please contact the Pharmacy Department (ext [phone]).  Thank you.  Wayland Denis, [facility] 09/07/2012 8:06 AM

## 2012-09-07 NOTE — Progress Notes (Deleted)
Pts O2 sats 78% on RA. Pt will require continuous o2 at 3l/[place].

## 2012-09-07 NOTE — Progress Notes (Signed)
Patient's room air sat 78%.Shortness of breath noted.0xygen applied to patient via nasal canula at 3L.Will continue to monitor patient ,rechecked 02 sat 87%. Patient is less dyspneic.

## 2012-09-07 NOTE — Progress Notes (Signed)
[name], [name]                ACCOUNT NO.:  [id number]  MEDICAL RECORD NO.:  [id number]  LOCATION:                                 FACILITY:  PHYSICIAN:  Melvyn Novas, MDDATE OF BIRTH:  Aug 18, 1940  DATE OF PROCEDURE:  09/04/2012 DATE OF DISCHARGE:                                PROGRESS NOTE   PROBLEMS LIST: 1. Paroxysmal AFib, now in sinus rhythm, currently anticoagulated. 2. Aspiration/hospital-acquired pneumonia, now resolving. 3. Obstructive sleep apnea with desaturation. 4. Hypertension. 5. Hyperlipidemia. 6. Coronary artery disease, status post multiple stents with normal LV     function. 7. BPH with bladder outlet obstruction.   The patient is much more lucid today.  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 b.i.d. for lumbar fusion many years ago.  Blood pressure 143/64, temperature 97.4, pulse 82, and regular respiratory rate 18.  WBC 7.2, hemoglobin steady 9.4.  He is also iron deficient on Niferex 150 b.i.d. and Coumadin per pharmacy protocol.  INR 1.69.  The plan right now is to discontinue Foley and add Flomax 0.4 p.o. daily, ambulate with physical therapy, and hopefully discharge in 3 days' time if the patient continues to improve.  Lungs are clear with diminished breath sounds at the bases with prolonged expiratory phase.  Heart regular rhythm.  No S3, S4 auscultated.  No heaves, thrills, or rubs.     Melvyn Novas, MD     RMD/MEDQ  D:  09/04/2012  T:  09/05/2012  Job:  [id number]

## 2012-09-07 NOTE — Progress Notes (Signed)
Physical Therapy Treatment Patient Details Name: [name] MRN: [id number] DOB: Dec 20, 1940 Today's Date: 09/07/2012 Time: 1130-1209 PT Time Calculation (min): 39 min  PT Assessment / Plan / Recommendation Comments on Treatment Session  Pt with increased gait distance and improved gait mechanics but does require cueing for hand placement for safety and focus on energy conversation to keep O2 sat appropriate level.  Pt educated on diaphragmatic breathing techniques and goal to keep O2 sat above 93%.     Follow Up Recommendations        Does the patient have the potential to tolerate intense rehabilitation     Barriers to Discharge        Equipment Recommendations       Recommendations for Other Services    Frequency     Plan      Precautions / Restrictions Precautions Precautions: Fall Restrictions Weight Bearing Restrictions: No   Pertinent Vitals/Pain     Mobility  Bed Mobility Bed Mobility: Supine to Sit;Sit to Supine;Sitting - Scoot to Edge of Bed Supine to Sit: 4: Min assist;HOB elevated Sitting - Scoot to [place] of Bed: 5: Supervision Sit to Supine: 4: Min guard;HOB elevated Details for Bed Mobility Assistance: Pt unable to sit by EOB without HOB raised.  Min guard with cueing for hand placement to assist with bed mobility. Transfers Transfers: Sit to Stand;Stand to Sit Sit to Stand: 6: Modified independent (Device/Increase time);From bed;From toilet Stand to Sit: 6: Modified independent (Device/Increase time);To chair/3-in-1;To toilet Details for Transfer Assistance: Cueing required for safe hand mechanics to reduce risk of falls. Ambulation/Gait Ambulation/Gait Assistance: 5: Supervision Ambulation Distance (Feet): 36 Feet (2 sets; 36 ft from bed -->chair and 45ft chair <-> toilet) Assistive device: Rolling walker Gait Pattern: Within Functional Limits Gait velocity: WNL General Gait Details: O2 left at 3L checked following supint to sit, following gait to  chair as well as following gait to toilet and back to chair.  O2 sat was 89% able to increase to 95% in 15 seconds following cueing for diaphragmatic breathing. Stairs: No Wheelchair Mobility Wheelchair Mobility: No    Exercises     PT Diagnosis:    PT Problem List:   PT Treatment Interventions:     PT Goals Acute Rehab PT Goals PT Goal: Supine/Side to Sit - Progress: Met PT Goal: Sit to Supine/Side - Progress: Met PT Goal: Sit to Stand - Progress: Met PT Goal: Stand to Sit - Progress: Met PT Goal: Ambulate - Progress: Progressing toward goal PT Goal: Up/Down Stairs - Progress: Not met  Visit Information  Last PT Received On: 09/07/12    Subjective Data  Subjective: I dont feel like doing anything with PT today,    Cognition  Cognition Overall Cognitive Status: Appears within functional limits for tasks assessed/performed Arousal/Alertness: Awake/alert Orientation Level: Appears intact for tasks assessed    Balance     End of Session PT - End of Session Equipment Utilized During Treatment: Gait belt Activity Tolerance: Patient limited by fatigue Patient left: in chair;with call bell/phone within reach;with chair alarm set;with nursing in room;with family/visitor present Nurse Communication: Mobility status   GP     [name] 09/07/2012, 2:03 PM

## 2012-09-07 NOTE — Discharge Summary (Signed)
[id number] 

## 2012-09-08 NOTE — Progress Notes (Signed)
Patient discharged home with instructions given on medications,and follow up visits,patient,and family verbalized understanding. Patient will be going home with 02 at 3L nasal canula. [facility] to follow up with patient at home.Accompanied by staff to an awaiting vehicle.

## 2012-09-08 NOTE — Discharge Summary (Signed)
[name], [name]                ACCOUNT NO.:  [id number]  MEDICAL RECORD NO.:  [id number]  LOCATION:  A316                          FACILITY:  [facility]  PHYSICIAN:  Melvyn Novas, MDDATE OF BIRTH:  [birth year]  DATE OF ADMISSION:  08/21/2012 DATE OF DISCHARGE:  03/31/2014LH                              DISCHARGE SUMMARY   HISTORY OF PRESENT ILLNESS:  The patient is a 72 year old white male who came in with paroxysmal AFib, rapid ventricular response, placed on amiodarone infusion, subsequently converted to sinus rhythm.  He __________ sinus rhythm.  He was given IV Cardizem several times and remained in sinus rhythm.  It was felt best to electively anticoagulate him first on Lovenox full dose and then on Coumadin.  He achieved INR therapy on day 5 of admission.  He likewise has a longstanding insulin- dependent diabetes, hypertension, coronary artery disease status post multiple stents, normal LV function.  He aspirated, vomited, had aspiration pneumonia acquired in hospital covered with vanco and Zosyn for another 8 days.  The patient had some altered mental status.  This was felt to be due to his acute renal failure, which was a prerenal component, long-standing diabetes and hypertension.  His renal failure resolved down to baseline, creatinine of 1.86.  His mental status cleared up with the removal of his chronic methadone and his further methadone dosage was reduced from 20 b.i.d. to 5 b.i.d.  He was in good glycemic control, had no further dysrhythmias, had no respiratory distress, although he requires continuous 3 L nasal O2.  He likewise had sleep apnea.  He was subsequently controlled on all of his hemodynamic parameters and was subsequently discharged on the following medications: Clonidine 0.2 mg p.o. t.i.d., diltiazem 120 p.o. q.12 h., Atrovent nebulizer solution q.6 h., Niferex 150 mg p.o. b.i.d., Lopressor tartrate 50 mg p.o. b.i.d., Flomax 0.4 mg p.o. daily,  warfarin 7.5 mg p.o. daily, glipizide 5 mg p.o. daily, sublingual nitroglycerin 0.4 p.r.n., Paxil 10 mg p.o. daily.  He was told to stop taking the following medicines:  Atenolol 50, aspirin 81, Plavix 75, Imdur 60, lisinopril 40, metformin 500, and Protonix 40 mg p.o. daily.  Most of these medicines were discontinued due to acute renal failure, however, some were to simplify his medical regimen as the patient is completely illiterate and has difficulty reading the bottles.  So it was felt that the risks outweighed the benefits.  What is imperative is that the patient has nebulizer therapy 4 times a day and checks his PT/INR in my office on Friday, which is 3 or 4 days post discharge.  I want to maintain anticoagulation status.  It was felt that 9 days of vanco and Zosyn was sufficient for hospital-acquired aspiration pneumonia, and will follow up clinically on his respiratory status.     Melvyn Novas, MD     RMD/MEDQ  D:  09/07/2012  T:  09/08/2012  Job:  [id number]

## 2012-09-19 ENCOUNTER — Emergency Department (HOSPITAL_COMMUNITY): Payer: Medicare Other

## 2012-09-19 ENCOUNTER — Inpatient Hospital Stay (HOSPITAL_COMMUNITY)
Admission: EM | Admit: 2012-09-19 | Discharge: 2012-10-05 | DRG: 208 | Disposition: A | Discharge status: Home / Self Care | Payer: Medicare Other | Attending: Internal Medicine | Admitting: Internal Medicine

## 2012-09-19 ENCOUNTER — Inpatient Hospital Stay (HOSPITAL_COMMUNITY): Payer: Medicare Other

## 2012-09-19 ENCOUNTER — Encounter (HOSPITAL_COMMUNITY): Payer: Self-pay | Admitting: *Deleted

## 2012-09-19 DIAGNOSIS — G9340 Encephalopathy, unspecified: Secondary | ICD-10-CM | POA: Diagnosis present

## 2012-09-19 DIAGNOSIS — F1120 Opioid dependence, uncomplicated: Secondary | ICD-10-CM | POA: Diagnosis present

## 2012-09-19 DIAGNOSIS — M5136 Other intervertebral disc degeneration, lumbar region: Secondary | ICD-10-CM

## 2012-09-19 DIAGNOSIS — T45515A Adverse effect of anticoagulants, initial encounter: Secondary | ICD-10-CM | POA: Diagnosis present

## 2012-09-19 DIAGNOSIS — I10 Essential (primary) hypertension: Secondary | ICD-10-CM

## 2012-09-19 DIAGNOSIS — R791 Abnormal coagulation profile: Secondary | ICD-10-CM

## 2012-09-19 DIAGNOSIS — J810 Acute pulmonary edema: Secondary | ICD-10-CM | POA: Diagnosis present

## 2012-09-19 DIAGNOSIS — E785 Hyperlipidemia, unspecified: Secondary | ICD-10-CM | POA: Diagnosis present

## 2012-09-19 DIAGNOSIS — J9600 Acute respiratory failure, unspecified whether with hypoxia or hypercapnia: Secondary | ICD-10-CM | POA: Diagnosis present

## 2012-09-19 DIAGNOSIS — G4733 Obstructive sleep apnea (adult) (pediatric): Secondary | ICD-10-CM | POA: Diagnosis present

## 2012-09-19 DIAGNOSIS — J9690 Respiratory failure, unspecified, unspecified whether with hypoxia or hypercapnia: Secondary | ICD-10-CM

## 2012-09-19 DIAGNOSIS — I2510 Atherosclerotic heart disease of native coronary artery without angina pectoris: Secondary | ICD-10-CM | POA: Diagnosis present

## 2012-09-19 DIAGNOSIS — J441 Chronic obstructive pulmonary disease with (acute) exacerbation: Secondary | ICD-10-CM

## 2012-09-19 DIAGNOSIS — I4891 Unspecified atrial fibrillation: Secondary | ICD-10-CM | POA: Diagnosis present

## 2012-09-19 DIAGNOSIS — N179 Acute kidney failure, unspecified: Secondary | ICD-10-CM

## 2012-09-19 DIAGNOSIS — J449 Chronic obstructive pulmonary disease, unspecified: Secondary | ICD-10-CM | POA: Diagnosis present

## 2012-09-19 DIAGNOSIS — Z550 Illiteracy and low-level literacy: Secondary | ICD-10-CM

## 2012-09-19 DIAGNOSIS — E1159 Type 2 diabetes mellitus with other circulatory complications: Secondary | ICD-10-CM | POA: Diagnosis present

## 2012-09-19 DIAGNOSIS — E1169 Type 2 diabetes mellitus with other specified complication: Secondary | ICD-10-CM

## 2012-09-19 DIAGNOSIS — E119 Type 2 diabetes mellitus without complications: Secondary | ICD-10-CM | POA: Diagnosis present

## 2012-09-19 DIAGNOSIS — R195 Other fecal abnormalities: Secondary | ICD-10-CM | POA: Diagnosis not present

## 2012-09-19 DIAGNOSIS — R404 Transient alteration of awareness: Secondary | ICD-10-CM | POA: Diagnosis present

## 2012-09-19 DIAGNOSIS — IMO0001 Reserved for inherently not codable concepts without codable children: Secondary | ICD-10-CM

## 2012-09-19 DIAGNOSIS — J189 Pneumonia, unspecified organism: Principal | ICD-10-CM | POA: Diagnosis present

## 2012-09-19 DIAGNOSIS — J811 Chronic pulmonary edema: Secondary | ICD-10-CM

## 2012-09-19 DIAGNOSIS — D638 Anemia in other chronic diseases classified elsewhere: Secondary | ICD-10-CM | POA: Diagnosis present

## 2012-09-19 DIAGNOSIS — D649 Anemia, unspecified: Secondary | ICD-10-CM

## 2012-09-19 DIAGNOSIS — J4489 Other specified chronic obstructive pulmonary disease: Secondary | ICD-10-CM

## 2012-09-19 DIAGNOSIS — I447 Left bundle-branch block, unspecified: Secondary | ICD-10-CM | POA: Diagnosis present

## 2012-09-19 DIAGNOSIS — H5316 Psychophysical visual disturbances: Secondary | ICD-10-CM | POA: Diagnosis not present

## 2012-09-19 LAB — BLOOD GAS, ARTERIAL
Acid-Base Excess: 7.5 mmol/L — ABNORMAL HIGH (ref 0.0–2.0)
MECHVT: 550 mL
O2 Saturation: 96.6 %
PEEP: 5 cmH2O
Patient temperature: 37
RATE: 15 resp/min

## 2012-09-19 LAB — GLUCOSE, CAPILLARY
Glucose-Capillary: 120 mg/dL — ABNORMAL HIGH (ref 70–99)
Glucose-Capillary: 93 mg/dL (ref 70–99)
Glucose-Capillary: 84 mg/dL (ref 70–99)

## 2012-09-19 LAB — CBC WITH DIFFERENTIAL/PLATELET
Eosinophils Absolute: 0.1 10*3/uL (ref 0.0–0.7)
Eosinophils Relative: 2 % (ref 0–5)
Hemoglobin: 9.3 g/dL — ABNORMAL LOW (ref 13.0–17.0)
Lymphs Abs: 0.8 10*3/uL (ref 0.7–4.0)
MCH: 24.3 pg — ABNORMAL LOW (ref 26.0–34.0)
MCV: 78.3 fL (ref 78.0–100.0)
Monocytes Relative: 10 % (ref 3–12)
Neutrophils Relative %: 69 % (ref 43–77)
RBC: 3.83 MIL/uL — ABNORMAL LOW (ref 4.22–5.81)

## 2012-09-19 LAB — COMPREHENSIVE METABOLIC PANEL
AST: 12 U/L (ref 0–37)
Albumin: 3.4 g/dL — ABNORMAL LOW (ref 3.5–5.2)
Calcium: 9 mg/dL (ref 8.4–10.5)
Creatinine, Ser: 1.26 mg/dL (ref 0.50–1.35)

## 2012-09-19 LAB — MRSA PCR SCREENING: MRSA by PCR: NEGATIVE

## 2012-09-19 LAB — URINALYSIS, ROUTINE W REFLEX MICROSCOPIC
Bilirubin Urine: NEGATIVE
Glucose, UA: NEGATIVE mg/dL
Protein, ur: NEGATIVE mg/dL
Urobilinogen, UA: 0.2 mg/dL (ref 0.0–1.0)

## 2012-09-19 LAB — URINE MICROSCOPIC-ADD ON

## 2012-09-19 LAB — RAPID URINE DRUG SCREEN, HOSP PERFORMED
Cocaine: NOT DETECTED
Opiates: NOT DETECTED
Tetrahydrocannabinol: NOT DETECTED

## 2012-09-19 LAB — ETHANOL: Alcohol, Ethyl (B): <11 mg/dL (ref 0–11)

## 2012-09-19 LAB — ACETAMINOPHEN LEVEL: Acetaminophen (Tylenol), Serum: <15 ug/mL (ref 10–30)

## 2012-09-19 LAB — TROPONIN I: Troponin I: <0.3 ng/mL (ref <0.30)

## 2012-09-19 MED ORDER — FUROSEMIDE 10 MG/ML IJ SOLN
40.0000 mg | Freq: Once | INTRAMUSCULAR | Status: AC
  Administered 2012-09-19: 40 mg via INTRAVENOUS
  Filled 2012-09-19: qty 4

## 2012-09-19 MED ORDER — DILTIAZEM 12 MG/ML ORAL SUSPENSION
60.0000 mg | Freq: Four times a day (QID) | ORAL | Status: DC
  Administered 2012-09-19 – 2012-09-20 (×3): 60 mg
  Filled 2012-09-19 (×10): qty 6

## 2012-09-19 MED ORDER — MIDAZOLAM HCL 2 MG/2ML IJ SOLN
4.0000 mg | Freq: Once | INTRAMUSCULAR | Status: AC
  Administered 2012-09-19: 4 mg via INTRAVENOUS

## 2012-09-19 MED ORDER — VITAMIN K1 10 MG/ML IJ SOLN
5.0000 mg | Freq: Once | INTRAVENOUS | Status: AC
  Administered 2012-09-19: 5 mg via INTRAVENOUS
  Filled 2012-09-19: qty 0.5

## 2012-09-19 MED ORDER — FENTANYL CITRATE 0.05 MG/ML IJ SOLN
INTRAMUSCULAR | Status: AC
  Filled 2012-09-19: qty 2

## 2012-09-19 MED ORDER — MIDAZOLAM HCL 2 MG/2ML IJ SOLN
INTRAMUSCULAR | Status: AC
  Filled 2012-09-19: qty 4

## 2012-09-19 MED ORDER — VANCOMYCIN HCL 10 G IV SOLR
1500.0000 mg | INTRAVENOUS | Status: DC
  Administered 2012-09-20: 1500 mg via INTRAVENOUS
  Filled 2012-09-19 (×2): qty 1500

## 2012-09-19 MED ORDER — VANCOMYCIN HCL 10 G IV SOLR
2000.0000 mg | Freq: Once | INTRAVENOUS | Status: AC
  Administered 2012-09-19: 2000 mg via INTRAVENOUS
  Filled 2012-09-19: qty 2000

## 2012-09-19 MED ORDER — PROPOFOL 10 MG/ML IV EMUL: 5.0000 ug/kg/min | INTRAVENOUS | Status: DC

## 2012-09-19 MED ORDER — CLONIDINE ORAL SUSPENSION 10 MCG/ML
0.1000 mg | Freq: Three times a day (TID) | ORAL | Status: DC
  Administered 2012-09-19 – 2012-09-20 (×2): 0.1 mg
  Filled 2012-09-19 (×5): qty 10

## 2012-09-19 MED ORDER — SODIUM CHLORIDE 0.9 % IV SOLN
25.0000 ug/h | INTRAVENOUS | Status: DC
  Filled 2012-09-19: qty 50

## 2012-09-19 MED ORDER — CHLORHEXIDINE GLUCONATE 0.12 % MT SOLN
15.0000 mL | Freq: Two times a day (BID) | OROMUCOSAL | Status: DC
  Administered 2012-09-19 – 2012-09-29 (×15): 15 mL via OROMUCOSAL
  Filled 2012-09-19 (×23): qty 15

## 2012-09-19 MED ORDER — SUCCINYLCHOLINE CHLORIDE 20 MG/ML IJ SOLN
INTRAMUSCULAR | Status: AC
  Administered 2012-09-19: 200 mg via INTRAVENOUS
  Filled 2012-09-19: qty 1

## 2012-09-19 MED ORDER — PIPERACILLIN-TAZOBACTAM 3.375 G IVPB
3.3750 g | Freq: Three times a day (TID) | INTRAVENOUS | Status: DC
  Administered 2012-09-19 – 2012-09-21 (×5): 3.375 g via INTRAVENOUS
  Filled 2012-09-19 (×7): qty 50

## 2012-09-19 MED ORDER — PANTOPRAZOLE SODIUM 40 MG IV SOLR
40.0000 mg | Freq: Every day | INTRAVENOUS | Status: DC
  Administered 2012-09-19 – 2012-09-21 (×3): 40 mg via INTRAVENOUS
  Filled 2012-09-19 (×5): qty 40

## 2012-09-19 MED ORDER — ETOMIDATE 2 MG/ML IV SOLN: 20.0000 mg | Freq: Once | INTRAVENOUS | Status: AC

## 2012-09-19 MED ORDER — PROPOFOL 10 MG/ML IV EMUL
5.0000 ug/kg/min | INTRAVENOUS | Status: DC
  Administered 2012-09-20 (×2): 25 ug/kg/min via INTRAVENOUS
  Filled 2012-09-19 (×2): qty 100

## 2012-09-19 MED ORDER — FENTANYL BOLUS VIA INFUSION
25.0000 ug | Freq: Four times a day (QID) | INTRAVENOUS | Status: DC | PRN
  Filled 2012-09-19: qty 100

## 2012-09-19 MED ORDER — ROCURONIUM BROMIDE 50 MG/5ML IV SOLN
INTRAVENOUS | Status: AC
Start: 2012-09-19 — End: 2012-09-20
  Filled 2012-09-19: qty 2

## 2012-09-19 MED ORDER — SUCCINYLCHOLINE CHLORIDE 20 MG/ML IJ SOLN: 200.0000 mg | Freq: Once | INTRAMUSCULAR | Status: AC

## 2012-09-19 MED ORDER — ALBUTEROL SULFATE (5 MG/ML) 0.5% IN NEBU: 2.5000 mg | INHALATION_SOLUTION | RESPIRATORY_TRACT | Status: DC | PRN

## 2012-09-19 MED ORDER — IPRATROPIUM BROMIDE 0.02 % IN SOLN: 0.5000 mg | RESPIRATORY_TRACT | Status: DC | PRN

## 2012-09-19 MED ORDER — NITROGLYCERIN IN D5W 200-5 MCG/ML-% IV SOLN
5.0000 ug/min | INTRAVENOUS | Status: DC
  Administered 2012-09-19: 5 ug/min via INTRAVENOUS
  Filled 2012-09-19: qty 250

## 2012-09-19 MED ORDER — SODIUM CHLORIDE 0.9 % IV SOLN
INTRAVENOUS | Status: DC
  Administered 2012-09-22 – 2012-09-24 (×3): via INTRAVENOUS

## 2012-09-19 MED ORDER — FENTANYL CITRATE 0.05 MG/ML IJ SOLN
100.0000 ug | Freq: Once | INTRAMUSCULAR | Status: AC
  Administered 2012-09-19: 100 ug via INTRAVENOUS

## 2012-09-19 MED ORDER — PROPOFOL 10 MG/ML IV EMUL
INTRAVENOUS | Status: AC
  Administered 2012-09-19: 1000 mg
  Filled 2012-09-19: qty 100

## 2012-09-19 MED ORDER — BIOTENE DRY MOUTH MT LIQD
15.0000 mL | Freq: Four times a day (QID) | OROMUCOSAL | Status: DC
  Administered 2012-09-20 – 2012-09-30 (×26): 15 mL via OROMUCOSAL

## 2012-09-19 MED ORDER — INSULIN ASPART 100 UNIT/ML ~~LOC~~ SOLN
0.0000 [IU] | SUBCUTANEOUS | Status: DC
  Administered 2012-09-21: 4 [IU] via SUBCUTANEOUS
  Administered 2012-09-21 (×2): 3 [IU] via SUBCUTANEOUS
  Administered 2012-09-21: 4 [IU] via SUBCUTANEOUS

## 2012-09-19 MED ORDER — LIDOCAINE HCL (CARDIAC) 20 MG/ML IV SOLN
INTRAVENOUS | Status: AC
  Filled 2012-09-19: qty 5

## 2012-09-19 MED ORDER — ETOMIDATE 2 MG/ML IV SOLN
INTRAVENOUS | Status: AC
  Administered 2012-09-19: 20 mg via INTRAVENOUS
  Filled 2012-09-19: qty 20

## 2012-09-19 MED ORDER — PROPOFOL 10 MG/ML IV EMUL
INTRAVENOUS | Status: AC
  Administered 2012-09-19: 5 ug/kg/min via INTRAVENOUS
  Filled 2012-09-19: qty 100

## 2012-09-19 MED ORDER — MIDAZOLAM HCL 5 MG/5ML IJ SOLN
4.0000 mg | Freq: Once | INTRAMUSCULAR | Status: AC
  Administered 2012-09-19: 4 mg via INTRAVENOUS

## 2012-09-19 NOTE — ED Notes (Signed)
Pt transported to ct by Mardene Celeste RN and resp therapy at bedside,

## 2012-09-19 NOTE — ED Notes (Signed)
[facility] here to trasnport pt,

## 2012-09-19 NOTE — ED Notes (Signed)
Report given to Maralyn Sago, RN on 2100

## 2012-09-19 NOTE — ED Notes (Addendum)
Pt continues to have movement, diprivan increased, friends at bedside, Dr. Bebe Shaggy speaking with friends,

## 2012-09-19 NOTE — ED Notes (Signed)
Report given to [facility],  

## 2012-09-19 NOTE — ED Notes (Addendum)
Beeped Critical Care through [facility] to [phone]. 

## 2012-09-19 NOTE — ED Notes (Signed)
Received report on pt, pt intubated with 7.5 ET tube, still continues to bite on tube, move legs,

## 2012-09-19 NOTE — ED Notes (Signed)
Pt found unresponsive at home, per [facility] pt came to and was able to ambulate to strecthcer and became unresponsive again en route and was given Narcan 2 mg, pt in resp distress

## 2012-09-19 NOTE — ED Notes (Signed)
[facility] has left with the pt

## 2012-09-19 NOTE — ED Notes (Signed)
Pt heart rate decreased to mid 70's, repeak ekg ordered, performed and given to Dr. Bebe Shaggy,

## 2012-09-19 NOTE — ED Notes (Signed)
Pt has two friends that help take care of him at bedside, report that he has been having "spells" at home often since being discharged from hospital a few weeks ago,

## 2012-09-19 NOTE — ED Provider Notes (Addendum)
History     CSN: [id number]  Arrival date & time 09/19/12  1255   First MD Initiated Contact with Patient 09/19/12 1259      Chief Complaint  Patient presents with  . Loss of Consciousness     Patient is a 72 y.o. male presenting with altered mental status. The history is provided by the EMS personnel. The history is limited by the condition of the patient.  Altered Mental Status This is a new problem. Episode onset: unknown time ago. The problem occurs constantly. The problem has been rapidly worsening. Associated symptoms include shortness of breath. Nothing aggravates the symptoms. Relieved by: narcan. Treatments tried: narcan. The treatment provided mild relief.   Pt presents via EMS Per EMS they were called for altered mental status On their arrival he was awake/alert and walked to stretcher. They report that glucose was >100 He became unresponsive en route and they gave narcan Pt responded to narcan and then he reported he felt improved Soon after this, pt became tachypneic, tachycardic and diaphoretic  Past Medical History  Diagnosis Date  . Coronary artery disease   . Hypertension   . Cancer   . Pneumonia   . CHF (congestive heart failure)   . High cholesterol   . Diabetes mellitus   . Hepatitis   . Renal disorder     kidney cancer  . LBBB (left bundle branch block)   . Paroxysmal atrial fibrillation   . Sleep apnea     Past Surgical History  Procedure Laterality Date  . Back surgery    . Coronary angioplasty with stent placement    . Nephrectomy      Family History  Problem Relation Age of Onset  . Hyperlipidemia Mother   . Heart attack Father   . Diabetes Mother     History  Substance Use Topics  . Smoking status: Former Games developer  . Smokeless tobacco: Not on file  . Alcohol Use: No      Review of Systems  Unable to perform ROS: Severe respiratory distress  Respiratory: Positive for shortness of breath.   Psychiatric/Behavioral: Positive for  altered mental status.    Allergies  Review of patient's allergies indicates no known allergies.  Home Medications   Current Outpatient Rx  Name  Route  Sig  Dispense  Refill  . cloNIDine (CATAPRES) 0.1 MG tablet   Oral   Take 2 tablets (0.2 mg total) by mouth 3 (three) times daily.   60 tablet   3   . diltiazem (CARDIZEM SR) 120 MG 12 hr capsule   Oral   Take 1 capsule (120 mg total) by mouth every 12 (twelve) hours.   60 capsule   3   . glipiZIDE (GLUCOTROL) 5 MG tablet   Oral   Take 1 tablet (5 mg total) by mouth daily before breakfast.   30 tablet   5   . hydrocodone-acetaminophen (LORCET-HD) 5-500 MG per capsule   Oral   Take 1 capsule by mouth every 6 (six) hours as needed for pain.   12 capsule   0   . insulin glargine (LANTUS) 100 UNIT/ML injection   Subcutaneous   Inject 0.35 mLs (35 Units total) into the skin at bedtime.   10 mL   4   . ipratropium (ATROVENT) 0.02 % nebulizer solution   Nebulization   Take 2.5 mLs (0.5 mg total) by nebulization every 6 (six) hours.   75 mL   4   . iron  polysaccharides (NIFEREX) 150 MG capsule   Oral   Take 1 capsule (150 mg total) by mouth 2 (two) times daily.   60 capsule   4   . methadone (DOLOPHINE) 5 MG tablet   Oral   Take 1 tablet (5 mg total) by mouth every 12 (twelve) hours.   60 tablet   0   . metoprolol (LOPRESSOR) 50 MG tablet   Oral   Take 1 tablet (50 mg total) by mouth 2 (two) times daily.   60 tablet   3   . nitroGLYCERIN (NITROSTAT) 0.4 MG SL tablet   Sublingual   Place 0.4 mg under the tongue every 5 (five) minutes as needed. Chest pain          . PARoxetine (PAXIL) 10 MG tablet   Oral   Take 2 tablets (20 mg total) by mouth daily.   30 tablet   3   . tamsulosin (FLOMAX) 0.4 MG CAPS   Oral   Take 1 capsule (0.4 mg total) by mouth daily after supper.   30 capsule   3   . warfarin (COUMADIN) 7.5 MG tablet   Oral   Take 1 tablet (7.5 mg total) by mouth daily.   30 tablet    1     BP 197/128  Resp 32  Wt 242 lb (109.77 kg)  BMI 37.32 kg/m2  SpO2 98% BP 124/81  Pulse 134  Temp(Src) 97.9 F (36.6 C) (Oral)  Resp 21  Wt 242 lb (109.77 kg)  BMI 37.32 kg/m2  SpO2 97%   Physical Exam CONSTITUTIONAL:ill appearing, diaphoretic, anxious HEAD: Normocephalic/atraumatic EYES: EOMI/PERRL ENMT: frothy sputum at mouth, poor dentition NECK: supple no meningeal signs CV: tachycardic, no loud murmurs noted LUNGS: tachypneic, wheezing noted but also with decreased BS bilaterally.   GU - normal appearance, no erythema noted, chaperone present ABDOMEN: soft, nontender, no rebound or guarding NEURO: Pt is awake/alert, moves all extremitiesx4 without any focal weakness, but he does appear confused EXTREMITIES: pulses normal, full ROM, no deformity, +edema bilaterally SKIN: diaphoretic PSYCH: anxious  ED Course  Procedures  CRITICAL CARE Performed by: Joya Gaskins   Total critical care time: 40  Critical care time was exclusive of separately billable procedures and treating other patients.  Critical care was necessary to treat or prevent imminent or life-threatening deterioration.  Critical care was time spent personally by me on the following activities: development of treatment plan with patient and/or surrogate as well as nursing, discussions with consultants, evaluation of patient's response to treatment, examination of patient, obtaining history from patient or surrogate, ordering and performing treatments and interventions, ordering and review of laboratory studies, ordering and review of radiographic studies, pulse oximetry and re-evaluation of patient's condition.   INTUBATION Performed by: Joya Gaskins  Required items: required  special equipment available Patient identity confirmed: provided demographic data and hospital-assigned identification number Time out not called due to emergent siutation  Indications: respiratory distress,  altered mental status  Intubation method: Glidescope Laryngoscopy   Preoxygenation: BVM  Sedatives: Etomidate Paralytic: Succinylcholine  Tube Size: 7.5 cuffed  Post-procedure assessment: chest rise and ETCO2 monitor Breath sounds: equal and absent over the epigastrium Tube secured with: ETT holder Chest x-ray interpreted by radiologist and me.  Chest x-ray findings: endotracheal tube in appropriate position  Patient tolerated the procedure well with no immediate complications.   MEDS GIVEN DURING ED STAY ETOMIDATE SUCCINYLCHOLINE FENTANYL VERSED LASIX NITROGLYCERIN PROPOFOL    Labs Reviewed  CBC WITH DIFFERENTIAL -  Abnormal; Notable for the following:    RBC 3.83 (*)    Hemoglobin 9.3 (*)    HCT 30.0 (*)    MCH 24.3 (*)    RDW 17.9 (*)    All other components within normal limits  LACTIC ACID, PLASMA - Abnormal; Notable for the following:    Lactic Acid, Venous 2.7 (*)    All other components within normal limits  CULTURE, BLOOD (ROUTINE X 2)  CULTURE, BLOOD (ROUTINE X 2)  URINE CULTURE  COMPREHENSIVE METABOLIC PANEL  PRO B NATRIURETIC PEPTIDE  PROTIME-INR  TROPONIN I  ACETAMINOPHEN LEVEL  ETHANOL  URINE RAPID DRUG SCREEN (HOSP PERFORMED)  SALICYLATE LEVEL  BLOOD GAS, ARTERIAL  URINALYSIS, ROUTINE W REFLEX MICROSCOPIC   Dg Chest Portable 1 View  09/19/2012  *RADIOLOGY REPORT*  Clinical Data: Shortness of breath and post intubation.  PORTABLE CHEST - 1 VIEW  Comparison: 09/04/2012  Findings: Endotracheal tube is 6.3 cm above the carina. Nasogastric tube extends into the abdomen.  Again noted are diffuse interstitial densities and possibly airspace disease in the right lower lung.  There is a cardiac pad overlying the left upper chest. Stable appearance of the cardiomediastinal silhouette.  IMPRESSION: Support apparatuses as described.  Diffuse interstitial lung densities.  Findings could represent interstitial pulmonary edema but infection cannot be excluded.    Original Report Authenticated By: Richarda Overlie, M.D.    I was called to room for respiratory distress On my arrival he was diaphoretic/hypertensive and ill appearing.  He had frothy sputum at his mouth.  My suspicion was that he may be in acute pulmonary edema.  He had worsening mental status and he started to become hypoxic.  Pt was intubated without difficulty.  NTG has been ordered after reviewing xray.  He has h/o afib and LBBB which I suspect is causing his EKG changes and less likely v-tach He will need admission.  I spoke to his local PCP on call ([email or web address]) and he requests I call critical care in [place] for transfer/admission  3:11 PM Pt still tachycardic but his BP is improved He has been stabilized in the ED I have spoken to critical care dr sood who accepts in transfer We performed CT head to exclude any ICH This was negative   MDM  Nursing notes including past medical history and social history reviewed and considered in documentation Labs/vital reviewed and considered xrays reviewed and considered Previous records reviewed and considered - h/o CHF and atrial fibillation        Date: 09/19/2012  Rate: 136  Rhythm: indeterminate  QRS Axis: left  Intervals: QT prolonged  ST/T Wave abnormalities: nonspecific ST changes  Conduction Disutrbances:left bundle branch block  Narrative Interpretation:   Old EKG Reviewed: pt has similar appearance in prior EKGs likely due to underlying LBBB    Joya Gaskins, MD 09/19/12 1514  4:04 PM Just prior to transfer, HR improved, SBP normalized Repeat EKG performed   Date: 09/19/2012 1600  Rate: 83  Rhythm: normal sinus rhythm  QRS Axis: left  Intervals: normal  ST/T Wave abnormalities: nonspecific ST changes  Conduction Disutrbances:left bundle branch block  Narrative Interpretation:   Old EKG Reviewed: rate is slower on this EKG    Joya Gaskins, MD 09/19/12 1606

## 2012-09-19 NOTE — ED Notes (Signed)
Pt intubated by Dr. Bebe Shaggy with 7 1/2 ET, 23 cm at teeth with positive color change with CO2 detector, equal rise and fall of chest

## 2012-09-19 NOTE — ED Notes (Signed)
Lab called with critical INR of 6.7, Dr. Bebe Shaggy notified,

## 2012-09-19 NOTE — Progress Notes (Signed)
ANTIBIOTIC CONSULT NOTE - INITIAL  Pharmacy Consult for Vancomycin and zosyn Indication: Suspected pneumonia  No Known Allergies  Patient Measurements: Weight: 235 lb 3.7 oz (106.7 kg)   Vital Signs: Temp: 97.8 F (36.6 C) (04/12 1800) Temp src: Oral (04/12 1732) BP: 110/56 mmHg (04/12 1800) Pulse Rate: 66 (04/12 1800) Intake/Output from previous day:   Intake/Output from this shift: Total I/O In: 59.5 [I.V.:59.5] Out: 1250 [Urine:1250]  Labs:  Recent Labs  09/19/12 1300  WBC 4.0  HGB 9.3*  PLT 275  CREATININE 1.26   The CrCl is unknown because both a height and weight (above a minimum accepted value) are required for this calculation. No results found for this basename: VANCOTROUGH, Leodis Binet, VANCORANDOM, GENTTROUGH, GENTPEAK, GENTRANDOM, TOBRATROUGH, TOBRAPEAK, TOBRARND, AMIKACINPEAK, AMIKACINTROU, AMIKACIN,  in the last 72 hours   Microbiology: Recent Results (from the past 720 hour(s))  MRSA PCR SCREENING     Status: None   Collection Time    08/21/12  6:45 PM      Result Value Range Status   MRSA by PCR NEGATIVE  NEGATIVE Final   Comment:            The GeneXpert MRSA Assay (FDA     approved for NASAL specimens     only), is one component of a     comprehensive MRSA colonization     surveillance program. It is not     intended to diagnose MRSA     infection nor to guide or     monitor treatment for     MRSA infections.  URINE CULTURE     Status: None   Collection Time    08/25/12  4:12 PM      Result Value Range Status   Specimen Description URINE, CATHETERIZED   Final   Special Requests NONE   Final   Culture  Setup Time 08/26/2012 03:22   Final   Colony Count NO GROWTH   Final   Culture NO GROWTH   Final   Report Status 08/26/2012 FINAL   Final  CULTURE, BLOOD (ROUTINE X 2)     Status: None   Collection Time    08/25/12  6:45 PM      Result Value Range Status   Specimen Description BLOOD LEFT ANTECUBITAL   Final   Special Requests BOTTLES  DRAWN AEROBIC AND ANAEROBIC 8CC EACH   Final   Culture NO GROWTH 5 DAYS   Final   Report Status 08/30/2012 FINAL   Final  CULTURE, BLOOD (ROUTINE X 2)     Status: None   Collection Time    08/25/12  6:50 PM      Result Value Range Status   Specimen Description BLOOD LEFT HAND   Final   Special Requests BOTTLES DRAWN AEROBIC AND ANAEROBIC 8CC EACH   Final   Culture NO GROWTH 5 DAYS   Final   Report Status 08/30/2012 FINAL   Final  URINE CULTURE     Status: None   Collection Time    08/26/12  7:27 AM      Result Value Range Status   Specimen Description URINE, CLEAN CATCH   Final   Special Requests NONE   Final   Culture  Setup Time 08/27/2012 02:59   Final   Colony Count NO GROWTH   Final   Culture NO GROWTH   Final   Report Status 08/28/2012 FINAL   Final  CULTURE, BLOOD (ROUTINE X 2)     Status:  None   Collection Time    09/19/12  1:00 PM      Result Value Range Status   Specimen Description RIGHT ANTECUBITAL   Final   Special Requests BOTTLES DRAWN AEROBIC AND ANAEROBIC 4CC   Final   Culture NO GROWTH <24 HRS   Final   Report Status PENDING   Incomplete  CULTURE, BLOOD (ROUTINE X 2)     Status: None   Collection Time    09/19/12  1:30 PM      Result Value Range Status   Specimen Description RIGHT ANTECUBITAL   Final   Special Requests BOTTLES DRAWN AEROBIC AND ANAEROBIC 4CC   Final   Culture PENDING   Incomplete   Report Status PENDING   Incomplete    Medical History: Past Medical History  Diagnosis Date  . Coronary artery disease   . Hypertension   . Cancer   . Pneumonia   . CHF (congestive heart failure)   . High cholesterol   . Diabetes mellitus   . Hepatitis   . Renal disorder     kidney cancer  . LBBB (left bundle branch block)   . Paroxysmal atrial fibrillation   . Sleep apnea    Assessment: 26 YOM with multiple PMHs presented with loss of consciousness, SOB, patient is intubated and admitted to 2100. Pharmacy is consulted to start vancomycin and zosyn  empirically for suspected PNA. Scr 1.26, est. crcl ~ 17ml/min afebrile, wbc 4.0, blood and urine culture pending  Goal of Therapy:  Vancomycin trough level 15-20 mcg/ml  Plan:  - Vancomycin 2000 mg IV x 1 then vancomycin 1500mg  IV Q 24hrs - Zosyn 3.375 g IV Q 8hrs (4 hr infusion) - f/u renal function and cultures  Bayard Hugger, PharmD, BCPS  Clinical Pharmacist  Pager: [phone]   09/19/2012,6:59 PM

## 2012-09-19 NOTE — ED Notes (Signed)
CRITICAL VALUE ALERT  Critical value received:  INR 6.7  Date of notification:  09/19/2012  Time of notification: 1346  Critical value read back: yes  Nurse who received alert:  Juliette Alcide, RN   MD notified (1st page):  Dr. Bebe Shaggy  Time of first page:  1346  MD notified (2nd page):  Time of second page:  Responding MD:    Time MD responded:  1346

## 2012-09-19 NOTE — H&P (Signed)
PULMONARY  / CRITICAL CARE MEDICINE  Name: [name] MRN: [id number] DOB: February 01, 1941    ADMISSION DATE:  09/19/2012  REFERRING MD :  Zadie Rhine  CHIEF COMPLAINT:  Change mental status  BRIEF PATIENT DESCRIPTION:  72 yo male brought to [facility] ED with altered mental status.  Intubated for airway protection.  Transferred to [facility] for further Tx under PCCM service.  Had recent admit for aspiration PNA, A fib with RVR.  SIGNIFICANT EVENTS: 4/12 Transfer to [facility]  STUDIES:  CT chest 4/12 >> negative for acute bleed  LINES / TUBES: ETT 4/12 >>   CULTURES: Blood 4/12 >> Urine 4/12 >> Sputum 4/12 >>   ANTIBIOTICS: Vancomycin 4/12 >> Zosyn 4/12 >>  HISTORY OF PRESENT ILLNESS:   Pt unable to provide hx.  Hx from medical record.  Pt was admitted from 3/14 to 3/31 at [facility] with aspiration pneumonia, and a fib with RVR.  He was found unresponsive at home, and EMS called.  He is on methadone, and was given narcan.  He briefly regained consciousness, but then became obtunded again.  He was intubated for airway protection.  He had elevated blood pressure and a fib with RVR.  Chest xray showed edema and infiltrate.  He was given lasix, NTG gtt.  He was noted to have elevated INR.  CT head was negative.  He was then transferred to [facility].  PAST MEDICAL HISTORY :  Past Medical History  Diagnosis Date  . Coronary artery disease   . Hypertension   . Cancer   . Pneumonia   . CHF (congestive heart failure)   . High cholesterol   . Diabetes mellitus   . Hepatitis   . Renal disorder     kidney cancer  . LBBB (left bundle branch block)   . Paroxysmal atrial fibrillation   . Sleep apnea    Past Surgical History  Procedure Laterality Date  . Back surgery    . Coronary angioplasty with stent placement    . Nephrectomy     Prior to Admission medications   Medication Sig Start Date End Date Taking? Authorizing Provider  cloNIDine (CATAPRES) 0.1 MG tablet Take 2 tablets (0.2 mg total) by  mouth 3 (three) times daily. 09/07/12  Yes Isabella Stalling, MD  diltiazem (CARDIZEM SR) 120 MG 12 hr capsule Take 1 capsule (120 mg total) by mouth every 12 (twelve) hours. 09/07/12  Yes Isabella Stalling, MD  HYDROcodone-acetaminophen [facility]) 10-325 MG per tablet Take 1 tablet by mouth every 6 (six) hours as needed for pain.   Yes Historical Provider, MD  insulin glargine (LANTUS) 100 UNIT/ML injection Inject 40 Units into the skin at bedtime.   Yes Historical Provider, MD  ipratropium (ATROVENT) 0.02 % nebulizer solution Take 0.5 mg by nebulization every 6 (six) hours as needed. Shortness of breath 09/07/12  Yes Isabella Stalling, MD  iron polysaccharides (NIFEREX) 150 MG capsule Take 1 capsule (150 mg total) by mouth 2 (two) times daily. 09/07/12  Yes Isabella Stalling, MD  methadone (DOLOPHINE) 5 MG tablet Take 5 mg by mouth daily. 09/07/12  Yes Isabella Stalling, MD  metoprolol (LOPRESSOR) 50 MG tablet Take 1 tablet (50 mg total) by mouth 2 (two) times daily. 09/07/12  Yes Isabella Stalling, MD  pantoprazole (PROTONIX) 40 MG tablet Take 40 mg by mouth daily.   Yes Historical Provider, MD  tamsulosin (FLOMAX) 0.4 MG CAPS Take 1 capsule (0.4 mg total) by mouth daily after supper.  09/07/12  Yes Isabella Stalling, MD  warfarin (COUMADIN) 7.5 MG tablet Take 1 tablet (7.5 mg total) by mouth daily. 09/07/12  Yes Isabella Stalling, MD  nitroGLYCERIN (NITROSTAT) 0.4 MG SL tablet Place 0.4 mg under the tongue every 5 (five) minutes as needed. Chest pain     Historical Provider, MD   No Known Allergies  FAMILY HISTORY:  Family History  Problem Relation Age of Onset  . Hyperlipidemia Mother   . Heart attack Father   . Diabetes Mother    SOCIAL HISTORY:  reports that he has quit smoking. He does not have any smokeless tobacco history on file. He reports that he does not drink alcohol or use illicit drugs.  REVIEW OF SYSTEMS:   Unable to obtain.  SUBJECTIVE:   VITAL SIGNS: Temp:  [96.4 F  (35.8 C)-98.6 F (37 C)] 97.8 F (36.6 C) (04/12 1732) Pulse Rate:  [66-137] 66 (04/12 1732) Resp:  [14-32] 15 (04/12 1732) BP: (103-197)/(54-128) 124/65 mmHg (04/12 1732) SpO2:  [97 %-98 %] 97 % (04/12 1732) FiO2 (%):  [40 %] 40 % (04/12 1732) Weight:  [235 lb 3.7 oz (106.7 kg)-242 lb (109.77 kg)] 235 lb 3.7 oz (106.7 kg) (04/12 1732) HEMODYNAMICS:   VENTILATOR SETTINGS: Vent Mode:  [-] PRVC FiO2 (%):  [40 %] 40 % Set Rate:  [15 bmp] 15 bmp Vt Set:  [550 mL] 550 mL PEEP:  [5 cmH20] 5 cmH20 Plateau Pressure:  [22 cmH20-27 cmH20] 26 cmH20 INTAKE / OUTPUT: Intake/Output     04/11 0701 - 04/12 0700 04/12 0701 - 04/13 0700   I.V. (mL/kg)  59.5 (0.6)   Total Intake(mL/kg)  59.5 (0.6)   Urine (mL/kg/hr)  1250   Total Output   1250   Net   -1190.5          PHYSICAL EXAMINATION: General:  No distress Neuro:  Sedated HEENT:  Pupils pin point, ETT in place Cardiovascular: irregular Lungs: scattered rhonchi Abdomen:  Soft, non tender Musculoskeletal:  Ankle edema Skin: no rashes  LABS:  Recent Labs Lab 09/19/12 1300 09/19/12 1423  HGB 9.3*  --   WBC 4.0  --   PLT 275  --   NA 135  --   K 4.5  --   CL 96  --   CO2 29  --   GLUCOSE 139*  --   BUN 14  --   CREATININE 1.26  --   CALCIUM 9.0  --   AST 12  --   ALT 16  --   ALKPHOS 108  --   BILITOT 0.4  --   PROT 7.9  --   ALBUMIN 3.4*  --   INR 6.73*  --   LATICACIDVEN 2.7*  --   TROPONINI <0.30  --   PROBNP 681.2*  --   PHART  --  7.441  PCO2ART  --  47.4*  PO2ART  --  75.5*    Recent Labs Lab 09/19/12 1558 09/19/12 1734  GLUCAP 120* 93    Imaging: Ct Head Wo Contrast  09/19/2012  *RADIOLOGY REPORT*  Clinical Data: Altered level of consciousness.  Atrial fibrillation.  Insulin dependent diabetes.  Hypertension.  CT HEAD WITHOUT CONTRAST  Technique:  Contiguous axial images were obtained from the base of the skull through the vertex without contrast.  Comparison: 08/25/2012.  Findings: Mild atrophy  and chronic microvascular ischemic change. No acute cortical infarction or hemorrhage.  No mass lesion or hydrocephalus.  Right  thalamic and  right putaminal lacunes.  Left middle cranial fossa arachnoid cyst. Vascular calcifications. Nasopharyngeal adenoidal hypertrophy or dependent nasopharyngeal fluid.  Calvarium intact.  No acute sinus or mastoid disease.  IMPRESSION: Chronic changes as described.  No acute intracranial findings. Stable appearance from priors.   Original Report Authenticated By: Davonna Belling, M.D.    Dg Chest Portable 1 View  09/19/2012  *RADIOLOGY REPORT*  Clinical Data: Shortness of breath and post intubation.  PORTABLE CHEST - 1 VIEW  Comparison: 09/04/2012  Findings: Endotracheal tube is 6.3 cm above the carina. Nasogastric tube extends into the abdomen.  Again noted are diffuse interstitial densities and possibly airspace disease in the right lower lung.  There is a cardiac pad overlying the left upper chest. Stable appearance of the cardiomediastinal silhouette.  IMPRESSION: Support apparatuses as described.  Diffuse interstitial lung densities.  Findings could represent interstitial pulmonary edema but infection cannot be excluded.   Original Report Authenticated By: Richarda Overlie, M.D.      ASSESSMENT / PLAN:  PULMONARY A: Acute respiratory failure 2nd to pulmonary edema +/- PNA. Hx of OSA. P:   -full vent support for now -f/u CXR, ABG  CARDIOVASCULAR A: A fib with RVR. HTN emergency with acute pulmonary edema. Hx of CAD, Hyperlipidemia. P:  -monitor heart rhythm -NTG gtt -negative fluid balance -continue catapress 0.1 mg tid -use cardizem 60 mg q6h until able to take pills >> then resume home regimen  RENAL A:  Hypervolemia. S/p nephrectomy. P:   -monitor renal fx, urine outpt, electrolytes -keep foley in for now  GASTROINTESTINAL A:  Hx of Hepatis. P:   -f/u LFT -NPO -Protonix for SUP  HEMATOLOGIC A:  Anemia of chronic disease. Coagulopathy 2nd to  coumadin for A fib. P:  -f/u CBC -vitamin K x one on 4/12 -f/u INR  INFECTIOUS A:  Recent therapy for aspiration PNA with persistent infiltrate on CXR. P:   -Vancomycin, zosyn -f/u culture results  ENDOCRINE A:  DM type II.   P:   -SSI  NEUROLOGIC A:  Acute encephalopathy ? Cause >> may be from opiates, HTN encephalopathy, hypoxia.  CT head negative for acute findings. P:   -sedation protocol while on vent  CC time 40 minutes.  Coralyn Helling, MD [facility] Pulmonary/Critical Care 09/19/2012, 7:08 PM Pager:  [phone] After 3pm call: [phone]

## 2012-09-20 ENCOUNTER — Inpatient Hospital Stay (HOSPITAL_COMMUNITY): Payer: Medicare Other

## 2012-09-20 LAB — COMPREHENSIVE METABOLIC PANEL
ALT: 11 U/L (ref 0–53)
AST: 11 U/L (ref 0–37)
CO2: 34 mEq/L — ABNORMAL HIGH (ref 19–32)
Calcium: 8.7 mg/dL (ref 8.4–10.5)
Creatinine, Ser: 1.3 mg/dL (ref 0.50–1.35)
GFR calc non Af Amer: 54 mL/min — ABNORMAL LOW (ref >90)
Sodium: 143 mEq/L (ref 135–145)
Total Protein: 6.2 g/dL (ref 6.0–8.3)

## 2012-09-20 LAB — GLUCOSE, CAPILLARY
Glucose-Capillary: 105 mg/dL — ABNORMAL HIGH (ref 70–99)
Glucose-Capillary: 107 mg/dL — ABNORMAL HIGH (ref 70–99)
Glucose-Capillary: 70 mg/dL (ref 70–99)
Glucose-Capillary: 82 mg/dL (ref 70–99)
Glucose-Capillary: 87 mg/dL (ref 70–99)

## 2012-09-20 LAB — CBC
HCT: 25.5 % — ABNORMAL LOW (ref 39.0–52.0)
Hemoglobin: 8.3 g/dL — ABNORMAL LOW (ref 13.0–17.0)
MCH: 24.5 pg — ABNORMAL LOW (ref 26.0–34.0)
RBC: 3.39 MIL/uL — ABNORMAL LOW (ref 4.22–5.81)

## 2012-09-20 LAB — URINE CULTURE
Colony Count: NO GROWTH
Culture: NO GROWTH

## 2012-09-20 LAB — PROTIME-INR
INR: 3.01 — ABNORMAL HIGH (ref 0.00–1.49)
Prothrombin Time: 29.6 seconds — ABNORMAL HIGH (ref 11.6–15.2)

## 2012-09-20 MED ORDER — FENTANYL CITRATE 0.05 MG/ML IJ SOLN
25.0000 ug | INTRAMUSCULAR | Status: DC | PRN
  Administered 2012-10-04: 50 ug via INTRAVENOUS
  Filled 2012-09-20: qty 2

## 2012-09-20 NOTE — Progress Notes (Addendum)
PULMONARY  / CRITICAL CARE MEDICINE DAILY PROGRESS NOTE  Name: [name] MRN: [id number] DOB: [birth year]    ADMISSION DATE:  09/19/2012  BRIEF PATIENT DESCRIPTION:  72 yo male brought to [facility] ED with altered mental status.  Intubated for airway protection.  Transferred to [facility] for further Tx under PCCM service.  Had recent admit for aspiration PNA, A fib with RVR. 4/12 Transfer to [facility].  STUDIES:  CT chest 4/12 >> negative for acute bleed  LINES / TUBES: ETT 4/12 >> 4/13  CULTURES: Blood 4/12 >> Pending Urine 4/12 >> Pending  Sputum 4/12 >> Pending  ANTIBIOTICS: Vancomycin 4/12 >> Zosyn 4/12 >>  SUBJECTIVE:  Patient is alert. Intubated. Friend at bedside.  VITAL SIGNS: Temp:  [95.5 F (35.3 C)-98.6 F (37 C)] 95.5 F (35.3 C) (04/13 0700) Pulse Rate:  [48-137] 48 (04/13 0733) Resp:  [14-32] 15 (04/13 0733) BP: (98-197)/(51-128) 101/51 mmHg (04/13 0733) SpO2:  [94 %-98 %] 98 % (04/13 0733) FiO2 (%):  [40 %] 40 % (04/13 0734) Weight:  [228 lb 6.3 oz (103.6 kg)-242 lb (109.77 kg)] 228 lb 6.3 oz (103.6 kg) (04/13 0600) HEMODYNAMICS:   VENTILATOR SETTINGS: Vent Mode:  [-] PRVC FiO2 (%):  [40 %] 40 % Set Rate:  [15 bmp] 15 bmp Vt Set:  [530 mL-550 mL] 530 mL PEEP:  [5 cmH20] 5 cmH20 Plateau Pressure:  [21 cmH20-27 cmH20] 24 cmH20 INTAKE / OUTPUT: Intake/Output     04/12 0701 - 04/13 0700 04/13 0701 - 04/14 0700   I.V. (mL/kg) 355.7 (3.4)    IV Piggyback 637.5    Total Intake(mL/kg) 993.2 (9.6)    Urine (mL/kg/hr) 3025    Total Output 3025     Net -2031.8            PHYSICAL EXAMINATION: General:  Alert and obeys commands. Not in distress. His friend at bedside Neuro:  Alert. HEENT:  Pupils pin point, ETT in place Cardiovascular: irregular Lungs: scattered rhonchi Abdomen:  Soft, non tender Musculoskeletal:  Ankle edema Skin: no rashes  LABS:  Recent Labs Lab 09/19/12 1300 09/19/12 1423 09/20/12 0527  HGB 9.3*  --  8.3*  WBC 4.0  --  2.8*  PLT  275  --  227  NA 135  --  143  K 4.5  --  4.0  CL 96  --  102  CO2 29  --  34*  GLUCOSE 139*  --  66*  BUN 14  --  13  CREATININE 1.26  --  1.30  CALCIUM 9.0  --  8.7  AST 12  --  11  ALT 16  --  11  ALKPHOS 108  --  82  BILITOT 0.4  --  0.5  PROT 7.9  --  6.2  ALBUMIN 3.4*  --  2.6*  INR 6.73*  --  3.01*  LATICACIDVEN 2.7*  --   --   TROPONINI <0.30  --   --   PROBNP 681.2*  --  796.8*  PHART  --  7.441  --   PCO2ART  --  47.4*  --   PO2ART  --  75.5*  --     Recent Labs Lab 09/19/12 1734 09/19/12 1924 09/20/12 0025 09/20/12 0406 09/20/12 0709  GLUCAP 93 84 70 71 82    Imaging: Ct Head Wo Contrast  09/19/2012  *RADIOLOGY REPORT*  Clinical Data: Altered level of consciousness.  Atrial fibrillation.  Insulin dependent diabetes.  Hypertension.  CT HEAD WITHOUT CONTRAST  Technique:  Contiguous axial images were obtained from the base of the skull through the vertex without contrast.  Comparison: 08/25/2012.  Findings: Mild atrophy and chronic microvascular ischemic change. No acute cortical infarction or hemorrhage.  No mass lesion or hydrocephalus.  Right  thalamic and right putaminal lacunes.  Left middle cranial fossa arachnoid cyst. Vascular calcifications. Nasopharyngeal adenoidal hypertrophy or dependent nasopharyngeal fluid.  Calvarium intact.  No acute sinus or mastoid disease.  IMPRESSION: Chronic changes as described.  No acute intracranial findings. Stable appearance from priors.   Original Report Authenticated By: Davonna Belling, M.D.    Dg Chest Port 1 View  09/20/2012  *RADIOLOGY REPORT*  Clinical Data: Ventilator dependent respiratory failure.  Follow up CHF.  PORTABLE CHEST - 1 VIEW 09/20/2012 0537 hours:  Comparison: Portable chest x-ray yesterday and 09/01/2012  Findings: Endotracheal tube tip in satisfactory position approximately 5 cm above carina.  Nasogastric tube courses below the diaphragm into the stomach.  Cardiac silhouette enlarged but stable.  Interval  slight improvement in the interstitial and airspace pulmonary edema, though moderate edema persist.  New dense atelectasis in both lower lobes.  IMPRESSION: Support apparatus satisfactory.  Slight improvement in CHF, though moderate interstitial and airspace edema persists.  New dense bilateral lower lobe atelectasis.  Cough,   Original Report Authenticated By: Hulan Saas, M.D.    Dg Chest Portable 1 View  09/19/2012  *RADIOLOGY REPORT*  Clinical Data: Shortness of breath and post intubation.  PORTABLE CHEST - 1 VIEW  Comparison: 09/04/2012  Findings: Endotracheal tube is 6.3 cm above the carina. Nasogastric tube extends into the abdomen.  Again noted are diffuse interstitial densities and possibly airspace disease in the right lower lung.  There is a cardiac pad overlying the left upper chest. Stable appearance of the cardiomediastinal silhouette.  IMPRESSION: Support apparatuses as described.  Diffuse interstitial lung densities.  Findings could represent interstitial pulmonary edema but infection cannot be excluded.   Original Report Authenticated By: Richarda Overlie, M.D.      ASSESSMENT / PLAN:  PULMONARY A: Acute respiratory failure 2nd to pulmonary edema +/- PNA. Improving. Hx of OSA. P:   - Trial of extubation today - CPAP qhs - oxygen to keep SpO2 > 92%  CARDIOVASCULAR A: A fib with RVR. Bradycardia HTN emergency with acute pulmonary edema. Hx of CAD, Hyperlipidemia. P:  -monitor heart rhythm -discontinue NTG gtt -hold catapress and cardizem for bradycardia -will resume rate control if HR is higher.   RENAL A:  Hypervolemia. S/p nephrectomy. P:   -monitor renal fx, urine outpt, electrolytes -foley in for now   GASTROINTESTINAL A:  Hx of Hepatis. Normal liver enzymes  P:   -advance diet as tolerated after extubation -Protonix for SUP  HEMATOLOGIC A:  Anemia of chronic disease. Hgb 8.3. INR improving 6.73 >>>3.1. Coagulopathy 2nd to coumadin for A fib. P:  -daily  CBC -Trend INR daily - will resume coumadin once INR < 3   INFECTIOUS A:  Recent therapy for aspiration PNA with persistent infiltrate on CXR. Respiratory and blood cultures - Pending. P:   continue with Vancomycin, zosyn   ENDOCRINE A:  DM type II.   P:   -SSI  NEUROLOGIC A:  Acute encephalopathy ? Cause >> may be from opiates, HTN encephalopathy, hypoxia.  CT head negative for acute findings. P:   -monitor mental status -limit opiate use as tolerated  Signed:  Dow Adolph, MD PGY-1 Internal Medicine Teaching Service Pager: [phone] 09/20/2012, 9:44 AM   Reviewed  above, examined pt, and agree with assessment/plan.  Mental status and respiratory status improved, and extubated.    CC time 35 minutes.  Coralyn Helling, MD [facility] Pulmonary/Critical Care 09/20/2012, 1:06 PM Pager:  [phone] After 3pm call: [phone]

## 2012-09-21 DIAGNOSIS — I509 Heart failure, unspecified: Secondary | ICD-10-CM

## 2012-09-21 LAB — CBC
Platelets: 234 10*3/uL (ref 150–400)
RBC: 3.6 MIL/uL — ABNORMAL LOW (ref 4.22–5.81)
WBC: 4 10*3/uL (ref 4.0–10.5)

## 2012-09-21 LAB — PROTIME-INR
INR: 2.38 — ABNORMAL HIGH (ref 0.00–1.49)
Prothrombin Time: 24.9 seconds — ABNORMAL HIGH (ref 11.6–15.2)

## 2012-09-21 LAB — BASIC METABOLIC PANEL
CO2: 32 mEq/L (ref 19–32)
Calcium: 8.4 mg/dL (ref 8.4–10.5)
Chloride: 101 mEq/L (ref 96–112)
GFR calc Af Amer: 54 mL/min — ABNORMAL LOW (ref >90)
Sodium: 140 mEq/L (ref 135–145)

## 2012-09-21 LAB — GLUCOSE, CAPILLARY

## 2012-09-21 MED ORDER — DEXTROSE 5 % IV SOLN
1.0000 g | INTRAVENOUS | Status: AC
  Administered 2012-09-21 – 2012-09-23 (×3): 1 g via INTRAVENOUS
  Filled 2012-09-21 (×4): qty 10

## 2012-09-21 MED ORDER — METOPROLOL TARTRATE 1 MG/ML IV SOLN: 5.0000 mg | Freq: Once | INTRAVENOUS | Status: AC

## 2012-09-21 MED ORDER — DILTIAZEM HCL 100 MG IV SOLR
10.0000 mg/h | INTRAVENOUS | Status: DC
  Administered 2012-09-21: 10 mg/h via INTRAVENOUS

## 2012-09-21 MED ORDER — AMIODARONE HCL IN DEXTROSE 360-4.14 MG/200ML-% IV SOLN
30.0000 mg/h | INTRAVENOUS | Status: DC
  Administered 2012-09-21 – 2012-09-22 (×2): 30 mg/h via INTRAVENOUS
  Filled 2012-09-21 (×4): qty 200

## 2012-09-21 MED ORDER — INSULIN ASPART 100 UNIT/ML ~~LOC~~ SOLN
0.0000 [IU] | Freq: Three times a day (TID) | SUBCUTANEOUS | Status: DC
  Administered 2012-09-22 – 2012-09-24 (×5): 3 [IU] via SUBCUTANEOUS
  Administered 2012-09-25 – 2012-09-26 (×3): 4 [IU] via SUBCUTANEOUS
  Administered 2012-09-26: 3 [IU] via SUBCUTANEOUS
  Administered 2012-09-27: 4 [IU] via SUBCUTANEOUS
  Administered 2012-09-27 – 2012-09-29 (×6): 3 [IU] via SUBCUTANEOUS
  Administered 2012-09-30: 4 [IU] via SUBCUTANEOUS
  Administered 2012-10-01 (×2): 3 [IU] via SUBCUTANEOUS
  Administered 2012-10-02 – 2012-10-03 (×2): 4 [IU] via SUBCUTANEOUS
  Administered 2012-10-04 (×3): 3 [IU] via SUBCUTANEOUS

## 2012-09-21 MED ORDER — DILTIAZEM HCL 100 MG IV SOLR
5.0000 mg/h | INTRAVENOUS | Status: DC
  Filled 2012-09-21: qty 100

## 2012-09-21 MED ORDER — DILTIAZEM HCL ER 60 MG PO CP12
120.0000 mg | ORAL_CAPSULE | Freq: Two times a day (BID) | ORAL | Status: DC
  Administered 2012-09-21: 120 mg via ORAL
  Filled 2012-09-21 (×2): qty 2

## 2012-09-21 MED ORDER — WARFARIN - PHARMACIST DOSING INPATIENT
Freq: Every day | Status: DC
  Administered 2012-09-22 – 2012-09-26 (×2)

## 2012-09-21 MED ORDER — NITROGLYCERIN 0.4 MG SL SUBL: 0.4000 mg | SUBLINGUAL_TABLET | SUBLINGUAL | Status: DC | PRN

## 2012-09-21 MED ORDER — AMIODARONE HCL IN DEXTROSE 360-4.14 MG/200ML-% IV SOLN
60.0000 mg/h | INTRAVENOUS | Status: DC
  Administered 2012-09-21 (×2): 60 mg/h via INTRAVENOUS
  Filled 2012-09-21 (×3): qty 200

## 2012-09-21 MED ORDER — DILTIAZEM LOAD VIA INFUSION
20.0000 mg | Freq: Once | INTRAVENOUS | Status: AC
  Filled 2012-09-21: qty 20

## 2012-09-21 MED ORDER — WARFARIN SODIUM 7.5 MG PO TABS
7.5000 mg | ORAL_TABLET | Freq: Once | ORAL | Status: AC
  Administered 2012-09-21: 7.5 mg via ORAL
  Filled 2012-09-21: qty 1

## 2012-09-21 MED ORDER — METOPROLOL TARTRATE 25 MG PO TABS
25.0000 mg | ORAL_TABLET | Freq: Two times a day (BID) | ORAL | Status: DC
  Administered 2012-09-21 – 2012-09-23 (×4): 25 mg via ORAL
  Filled 2012-09-21 (×7): qty 1

## 2012-09-21 MED ORDER — METOPROLOL TARTRATE 1 MG/ML IV SOLN
INTRAVENOUS | Status: AC
  Administered 2012-09-21: 5 mg via INTRAVENOUS
  Filled 2012-09-21: qty 5

## 2012-09-21 MED ORDER — DILTIAZEM HCL 60 MG PO TABS: 120.0000 mg | ORAL_TABLET | Freq: Two times a day (BID) | ORAL | Status: DC

## 2012-09-21 MED ORDER — ASPIRIN 81 MG PO CHEW
CHEWABLE_TABLET | ORAL | Status: AC
  Administered 2012-09-21: 81 mg
  Filled 2012-09-21: qty 1

## 2012-09-21 MED ORDER — SODIUM CHLORIDE 0.9 % IV SOLN
INTRAVENOUS | Status: DC
  Administered 2012-09-21: 14:00:00 via INTRAVENOUS

## 2012-09-21 MED ORDER — SODIUM CHLORIDE 0.9 % IV SOLN: INTRAVENOUS | Status: DC

## 2012-09-21 MED ORDER — DILTIAZEM HCL 100 MG IV SOLR
20.0000 mg/h | INTRAVENOUS | Status: DC
  Administered 2012-09-21 – 2012-09-22 (×2): 20 mg/h via INTRAVENOUS
  Filled 2012-09-21 (×7): qty 100

## 2012-09-21 MED ORDER — DILTIAZEM HCL 25 MG/5ML IV SOLN
20.0000 mg | Freq: Once | INTRAVENOUS | Status: DC
  Administered 2012-09-21: 20 mg via INTRAVENOUS

## 2012-09-21 MED ORDER — DILTIAZEM HCL 25 MG/5ML IV SOLN
20.0000 mg | Freq: Once | INTRAVENOUS | Status: AC
  Administered 2012-09-21: 15:00:00 via INTRAVENOUS
  Filled 2012-09-21: qty 5

## 2012-09-21 MED ORDER — AMIODARONE LOAD VIA INFUSION
150.0000 mg | Freq: Once | INTRAVENOUS | Status: AC
  Administered 2012-09-21: 150 mg via INTRAVENOUS
  Filled 2012-09-21: qty 83.34

## 2012-09-21 MED ORDER — NITROGLYCERIN 0.4 MG SL SUBL
SUBLINGUAL_TABLET | SUBLINGUAL | Status: AC
  Administered 2012-09-21: 0.4 mg via SUBLINGUAL
  Filled 2012-09-21: qty 25

## 2012-09-21 MED ORDER — ASPIRIN EC 81 MG PO TBEC
81.0000 mg | DELAYED_RELEASE_TABLET | Freq: Once | ORAL | Status: AC
  Filled 2012-09-21: qty 1

## 2012-09-21 MED ORDER — DILTIAZEM LOAD VIA INFUSION
20.0000 mg | Freq: Once | INTRAVENOUS | Status: DC
  Filled 2012-09-21: qty 20

## 2012-09-21 NOTE — Progress Notes (Addendum)
PULMONARY  / CRITICAL CARE MEDICINE DAILY PROGRESS NOTE  Name: [name] MRN: [id number] DOB: 02/27/1941    ADMISSION DATE:  09/19/2012  BRIEF PATIENT DESCRIPTION:  72 yo male brought to [facility] ED with altered mental status.  Intubated for airway protection.  Transferred to [facility] for further Tx under PCCM service.  Had recent admit for aspiration PNA, A fib with RVR. 4/12 Transfer to [facility].  STUDIES:  CT chest 4/12 >> negative for acute bleed  LINES / TUBES: ETT 4/12 >> 4/13  CULTURES: Blood 4/12 >> Pending Urine 4/12 >> No growth Sputum 4/12 >> Pending  ANTIBIOTICS: Vancomycin 4/12 >>4/14 Zosyn 4/12 >>4/12 Ceftriaxone 4/14>>>plan stop 4/16  SUBJECTIVE:  Patient is alert. Obeys commands and interactive. He is breathing is better.  He denies pain.  VITAL SIGNS: Temp:  [95.7 F (35.4 C)-99.3 F (37.4 C)] 99.1 F (37.3 C) (04/14 0600) Pulse Rate:  [52-90] 84 (04/14 0600) Resp:  [14-23] 17 (04/14 0600) BP: (105-154)/(55-90) 154/67 mmHg (04/14 0600) SpO2:  [94 %-100 %] 97 % (04/14 0600) FiO2 (%):  [36 %-40 %] 36 % (04/13 1157) HEMODYNAMICS:   VENTILATOR SETTINGS: Vent Mode:  [-] CPAP;PSV FiO2 (%):  [36 %-40 %] 36 % PEEP:  [5 cmH20] 5 cmH20 Pressure Support:  [8 cmH20] 8 cmH20 INTAKE / OUTPUT: Intake/Output     04/13 0701 - 04/14 0700 04/14 0701 - 04/15 0700   P.O. 300    I.V. (mL/kg) 160 (1.5)    IV Piggyback 650    Total Intake(mL/kg) 1110 (10.7)    Urine (mL/kg/hr) 2025 (0.8)    Total Output 2025     Net -915            PHYSICAL EXAMINATION: General:  Alert and obeys commands. Not in distress.  Neuro:  Alert and O X 3  HEENT:  Pupils pin point.  Cardiovascular: irregular Lungs: scattered rhonchi Abdomen:  Soft, non tender Musculoskeletal:  Ankle edema Skin: no rashes  LABS:  Recent Labs Lab 09/19/12 1300 09/19/12 1423 09/20/12 0527 09/21/12 0450  HGB 9.3*  --  8.3* 8.7*  WBC 4.0  --  2.8* 4.0  PLT 275  --  227 234  NA 135  --  143 140  K 4.5   --  4.0 4.1  CL 96  --  102 101  CO2 29  --  34* 32  GLUCOSE 139*  --  66* 145*  BUN 14  --  13 12  CREATININE 1.26  --  1.30 1.48*  CALCIUM 9.0  --  8.7 8.4  AST 12  --  11  --   ALT 16  --  11  --   ALKPHOS 108  --  82  --   BILITOT 0.4  --  0.5  --   PROT 7.9  --  6.2  --   ALBUMIN 3.4*  --  2.6*  --   INR 6.73*  --  3.01* 2.38*  LATICACIDVEN 2.7*  --   --   --   TROPONINI <0.30  --   --   --   PROBNP 681.2*  --  796.8*  --   PHART  --  7.441  --   --   PCO2ART  --  47.4*  --   --   PO2ART  --  75.5*  --   --     Recent Labs Lab 09/20/12 1139 09/20/12 1531 09/20/12 1916 09/20/12 2333 09/21/12 0338  GLUCAP 87 105* 107*  127* 175*    Imaging: Ct Head Wo Contrast  09/19/2012  *RADIOLOGY REPORT*  Clinical Data: Altered level of consciousness.  Atrial fibrillation.  Insulin dependent diabetes.  Hypertension.  CT HEAD WITHOUT CONTRAST  Technique:  Contiguous axial images were obtained from the base of the skull through the vertex without contrast.  Comparison: 08/25/2012.  Findings: Mild atrophy and chronic microvascular ischemic change. No acute cortical infarction or hemorrhage.  No mass lesion or hydrocephalus.  Right  thalamic and right putaminal lacunes.  Left middle cranial fossa arachnoid cyst. Vascular calcifications. Nasopharyngeal adenoidal hypertrophy or dependent nasopharyngeal fluid.  Calvarium intact.  No acute sinus or mastoid disease.  IMPRESSION: Chronic changes as described.  No acute intracranial findings. Stable appearance from priors.   Original Report Authenticated By: Davonna Belling, M.D.    Dg Chest Port 1 View  09/20/2012  *RADIOLOGY REPORT*  Clinical Data: Ventilator dependent respiratory failure.  Follow up CHF.  PORTABLE CHEST - 1 VIEW 09/20/2012 0537 hours:  Comparison: Portable chest x-ray yesterday and 09/01/2012  Findings: Endotracheal tube tip in satisfactory position approximately 5 cm above carina.  Nasogastric tube courses below the diaphragm into  the stomach.  Cardiac silhouette enlarged but stable.  Interval slight improvement in the interstitial and airspace pulmonary edema, though moderate edema persist.  New dense atelectasis in both lower lobes.  IMPRESSION: Support apparatus satisfactory.  Slight improvement in CHF, though moderate interstitial and airspace edema persists.  New dense bilateral lower lobe atelectasis.  Cough,   Original Report Authenticated By: Hulan Saas, M.D.    Dg Chest Portable 1 View  09/19/2012  *RADIOLOGY REPORT*  Clinical Data: Shortness of breath and post intubation.  PORTABLE CHEST - 1 VIEW  Comparison: 09/04/2012  Findings: Endotracheal tube is 6.3 cm above the carina. Nasogastric tube extends into the abdomen.  Again noted are diffuse interstitial densities and possibly airspace disease in the right lower lung.  There is a cardiac pad overlying the left upper chest. Stable appearance of the cardiomediastinal silhouette.  IMPRESSION: Support apparatuses as described.  Diffuse interstitial lung densities.  Findings could represent interstitial pulmonary edema but infection cannot be excluded.   Original Report Authenticated By: Richarda Overlie, M.D.      ASSESSMENT / PLAN:  PULMONARY A: Acute respiratory failure 2nd to pulmonary edema +/- PNA. Improving. Hx of OSA. P:   - 4/13 > Extubated. - CPAP qhs - oxygen to keep SpO2 > 92% -may need to limit diuresis, see renal  CARDIOVASCULAR A: A fib with RVR. Bradycardia - resolved. Today pulse 99 and BP is elevated.  HTN emergency with acute pulmonary edema - improved Hx of CAD, Hyperlipidemia. P:  -monitor heart rhythm -4/14 >>Restart Cardizem  RENAL A:  Hypervolemia. Creatinine slightly high to 1.48. S/p nephrectomy.  P:   -monitor renal fx, urine outpt, electrolytes -foley in for now - IV NS  To 50 cc/hr    GASTROINTESTINAL A:  Hx of Hepatis. Normal liver enzymes  P:   -advance diet as tolerated -Protonix for SUP  HEMATOLOGIC A:  Anemia of  chronic disease. Hgb 8.3. INR improving 6.73 >>>3.1 >2.8. Coagulopathy 2nd to coumadin for A fib. P:  -daily CBC -Trend INR daily - resume coumadin per pharmacy  INFECTIOUS A:  Recent therapy for aspiration PNA with persistent infiltrate on CXR. Respiratory and blood cultures - Pending. P:   Narrow off Vancomycin, zosyn Add ceftriaxone, add stop date 5 days, unclear if infection present  ENDOCRINE A:  DM type II.  P:   -SSI  NEUROLOGIC A:  Acute encephalopathy - resolved. ? Cause >> may be from opiates, HTN encephalopathy, hypoxia.  CT head negative for acute findings. P:   -monitor mental status -limit opiate use as tolerated  Signed:  Dow Adolph, MD PGY-1 Internal Medicine Teaching Service Pager: [phone] 09/21/2012, 8:02 AM   I have fully examined this patient and agree with above findings.    And edited in full  To tele, triad  Mcarthur Rossetti. Tyson Alias, MD, FACP Pgr: [phone] [place] [facility]

## 2012-09-21 NOTE — Progress Notes (Signed)
Dr. Tyson Alias on unit and aware of return on ventricular response to 145, order to increase Cardizem to 15-20 if needed to control ventricular response, Berle Mull RN

## 2012-09-21 NOTE — Progress Notes (Signed)
Dr. Eden Emms here to see pt, orders for Diltiazem to 20 mg/hr, Berle Mull RN

## 2012-09-21 NOTE — Progress Notes (Signed)
Onset of atrial fib with rapid ventricular response accompanied by chest discomfort, NTG 0.4mg  SL given per order of MD, ASA 81mg  given, rate sustaining at 170-180s , pt diaphoretic and pale, MD in to see patient, Berle Mull RN

## 2012-09-21 NOTE — Consult Note (Signed)
CARDIOLOGY CONSULT NOTE    Patient ID: [name] MRN: [id number] DOB/AGE: 08-28-1940 72 y.o.  Admit date: 09/19/2012 Referring Physician:  Tyson Alias Primary Physician: No primary provider on file. Primary Cardiologist:  Seen by Dr Tenny Craw at [facility] 3/14 but apparently follows in [place] with Dr Fanny Skates Reason for Consultation:  Afib  Active Problems:   * No active hospital problems. *   HPI:   72 year old obese Caucasian male with history of coronary artery disease, stents to unknown artery within the last 10 years, placed at [facility], and also hospital in [place].  Recently admitted with PAF and converted with amiodarone.    The patient has a prior history of CAD, hypertension, hyperlipidemia, insulin-dependent diabetes, and episodes of irregular palpitations although not sustained. He is recently been est. with [facility] cardiology and has been seen by them within the last year. He is scheduled for annual appointments. Last admission EF reported to be 55% with mildly elevated BNP in 500 range enzymes negative.   Patient transferred to [facility] obtunded and intubated for airway protection. Has had recurrent rapid afib with CXR suggesting volume overload and BNP 796.  Has been diuresed and extubated now. He is not clear of what happenned He has someone that lives with him and ?'s if he tried to over dose me.  Says he does not take excess narcotics.  He says he is not hooked on methadone and only takes pain meds on occasion when he rides his motorcycle and his back hurts.  Currently no dyspnea, or chest pain despite being afib at rate 150.  Troponin and ETOH levels negative on admission     All other systems reviewed and negative except as noted above  Past Medical History  Diagnosis Date  . Coronary artery disease   . Hypertension   . Cancer   . Pneumonia   . CHF (congestive heart failure)   . High cholesterol   . Diabetes mellitus   . Hepatitis   .  Renal disorder     kidney cancer  . LBBB (left bundle branch block)   . Paroxysmal atrial fibrillation   . Sleep apnea     Family History  Problem Relation Age of Onset  . Hyperlipidemia Mother   . Heart attack Father   . Diabetes Mother     History   Social History  . Marital Status: Legally Separated    Spouse Name: N/A    Number of Children: N/A  . Years of Education: N/A   Occupational History  . Not on file.   Social History Main Topics  . Smoking status: Former Games developer  . Smokeless tobacco: Not on file  . Alcohol Use: No  . Drug Use: No  . Sexually Active: Not Currently   Other Topics Concern  . Not on file   Social History Narrative  . No narrative on file    Past Surgical History  Procedure Laterality Date  . Back surgery    . Coronary angioplasty with stent placement    . Nephrectomy       . antiseptic oral rinse  15 mL Mouth Rinse QID  . cefTRIAXone (ROCEPHIN)  IV  1 g Intravenous Q24H  . chlorhexidine  15 mL Mouth Rinse BID  . insulin aspart  0-20 Units Subcutaneous Q4H  . pantoprazole (PROTONIX) IV  40 mg Intravenous Daily  . warfarin  7.5 mg Oral ONCE-1800  . Warfarin - Pharmacist Dosing Inpatient   Does not  apply q1800   . sodium chloride 50 mL/hr at 09/21/12 1502    Physical Exam: BP 140/76  Pulse 149  Temp(Src) 98.4 F (36.9 C) (Oral)  Resp 24  Ht 5\' 7"  (1.702 m)  Wt 228 lb 6.3 oz (103.6 kg)  BMI 35.76 kg/m2  SpO2 94%   Affect appropriate Desheveled white male HEENT: normal Neck supple with no adenopathy JVP normal no bruits no thyromegaly Lungs clear with no wheezing and good diaphragmatic motion Heart:  S1/S2 no murmur, no rub, gallop or click PMI normal Abdomen: benighn, BS positve, no tenderness, no AAA no bruit.  No HSM or HJR Distal pulses intact with no bruits No edema Neuro non-focal Skin warm and dry No muscular weakness   Labs:   Lab Results  Component Value Date   WBC 4.0 09/21/2012   HGB 8.7* 09/21/2012    HCT 27.1* 09/21/2012   MCV 75.3* 09/21/2012   PLT 234 09/21/2012    Recent Labs Lab 09/20/12 0527 09/21/12 0450  NA 143 140  K 4.0 4.1  CL 102 101  CO2 34* 32  BUN 13 12  CREATININE 1.30 1.48*  CALCIUM 8.7 8.4  PROT 6.2  --   BILITOT 0.5  --   ALKPHOS 82  --   ALT 11  --   AST 11  --   GLUCOSE 66* 145*   Lab Results  Component Value Date   CKTOTAL 49 12/15/2011   CKMB 1.5 12/15/2011   TROPONINI <0.30 09/19/2012    Lab Results  Component Value Date   CHOL 122 06/09/2011   CHOL  Value: 118        ATP III CLASSIFICATION:  <200     mg/dL   Desirable  147-829  mg/dL   Borderline High  >=562    mg/dL   High 13/01/6577   Lab Results  Component Value Date   HDL 50 06/09/2011   HDL 36* 04/27/2007   Lab Results  Component Value Date   LDLCALC 59 06/09/2011   LDLCALC  Value: 72        Total Cholesterol/HDL:CHD Risk Coronary Heart Disease Risk Table                     Men   Women  1/2 Average Risk   3.4   3.3 04/27/2007   Lab Results  Component Value Date   TRIG 63 06/09/2011   TRIG 49 04/27/2007   Lab Results  Component Value Date   CHOLHDL 2.4 06/09/2011   CHOLHDL 3.3 04/27/2007   No results found for this basename: LDLDIRECT      Radiology: Dg Chest 1 View  08/26/2012  *RADIOLOGY REPORT*  Clinical Data: Aspiration.  Patient undergoing CPAP.  CHEST - 1 VIEW  Comparison: Portable chest x-ray 08/21/2012, 06/11/2011.  Two-view chest x-ray 02/08/2011.  Findings: Suboptimal inspiration accounts for crowded bronchovascular markings, especially in the lung bases, and accentuates the cardiac silhouette.  Taking this into account, cardiac silhouette moderately enlarged but stable.  Interval development of airspace consolidation in the right upper lobe and pulmonary venous hypertension since the examination 5 days ago.  No overt pulmonary edema.  IMPRESSION: Right upper lobe pneumonia.  Pulmonary venous hypertension without overt edema.  Stable cardiomegaly.   Original Report  Authenticated By: Hulan Saas, M.D.    Dg Chest 2 View  09/04/2012  *RADIOLOGY REPORT*  Clinical Data: Assess infiltrates  CHEST - 2 VIEW  Comparison: 09/03/2012  Findings: Right-sided PICC line  is again seen.  Bilateral infiltrates are again noted and stable.  Cardiac shadow is stable.  IMPRESSION: Stable bilateral infiltrative change   Original Report Authenticated By: Alcide Clever, M.D.    Dg Chest 2 View  09/03/2012  *RADIOLOGY REPORT*  Clinical Data: Previously seen infiltrate, check for resolution  CHEST - 2 VIEW  Comparison: 09/01/2012  Findings: The cardiac shadow is again enlarged.  Bilateral infiltrates are again seen and stable.  The right-sided PICC line is again noted.  No new focal abnormality is seen.  IMPRESSION: Stable bilateral infiltrates   Original Report Authenticated By: Alcide Clever, M.D.    Ct Head Wo Contrast  09/19/2012  *RADIOLOGY REPORT*  Clinical Data: Altered level of consciousness.  Atrial fibrillation.  Insulin dependent diabetes.  Hypertension.  CT HEAD WITHOUT CONTRAST  Technique:  Contiguous axial images were obtained from the base of the skull through the vertex without contrast.  Comparison: 08/25/2012.  Findings: Mild atrophy and chronic microvascular ischemic change. No acute cortical infarction or hemorrhage.  No mass lesion or hydrocephalus.  Right  thalamic and right putaminal lacunes.  Left middle cranial fossa arachnoid cyst. Vascular calcifications. Nasopharyngeal adenoidal hypertrophy or dependent nasopharyngeal fluid.  Calvarium intact.  No acute sinus or mastoid disease.  IMPRESSION: Chronic changes as described.  No acute intracranial findings. Stable appearance from priors.   Original Report Authenticated By: Davonna Belling, M.D.    Ct Head Wo Contrast  08/25/2012  *RADIOLOGY REPORT*  Clinical Data: Evaluate for bleed or infarct.  On Coumadin. Altered mental status.  History high blood pressure.  CT HEAD WITHOUT CONTRAST  Technique:  Contiguous axial  images were obtained from the base of the skull through the vertex without contrast.  Comparison: 04/26/2007.  Findings: No intracranial hemorrhage.  Remote infarct right lenticular nucleus.  Small vessel disease type changes without CT evidence of large acute infarct.  Global atrophy without hydrocephalus.  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  No other intracranial mass lesion noted on this unenhanced exam.  Vascular calcifications.  Mastoid air cells, middle ear cavities and visualized paranasal sinuses are clear.  Mild exophthalmos.  IMPRESSION: No intracranial hemorrhage or CT evidence of large acute infarct. Please see above.   Original Report Authenticated By: Lacy Duverney, M.D.    US Renal  08/26/2012  *RADIOLOGY REPORT*  Clinical Data: Renal insufficiency. 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  RENAL/URINARY TRACT ULTRASOUND COMPLETE  Comparison:  CT scan dated 03/09/2012  Findings:  Right Kidney:  Removed.  Left Kidney:  14.0 cm in length.  1 cm cyst on the lateral aspect of the lower pole, unchanged.  No hydronephrosis or renal cortical thinning.  Bladder:  The bladder is empty with a Foley catheter in place.  IMPRESSION: Normal appearing left kidney.  Stable tiny cyst in the lower pole.   Original Report Authenticated By: Francene Boyers, M.D.    Dg Chest Port 1 View  09/20/2012  *RADIOLOGY REPORT*  Clinical Data: Ventilator dependent respiratory failure.  Follow up CHF.  PORTABLE CHEST - 1 VIEW 09/20/2012 0537 hours:  Comparison: Portable chest x-ray yesterday and 09/01/2012  Findings: Endotracheal tube tip in satisfactory position approximately 5 cm above carina.  Nasogastric tube courses below the diaphragm into the stomach.  Cardiac silhouette enlarged but stable.  Interval slight improvement in the interstitial and airspace pulmonary edema, though moderate edema persist.  New dense atelectasis in both lower lobes.  IMPRESSION: Support apparatus satisfactory.   Slight improvement in CHF, though moderate  interstitial and airspace edema persists.  New dense bilateral lower lobe atelectasis.  Cough,   Original Report Authenticated By: Hulan Saas, M.D.    Dg Chest Portable 1 View  09/19/2012  *RADIOLOGY REPORT*  Clinical Data: Shortness of breath and post intubation.  PORTABLE CHEST - 1 VIEW  Comparison: 09/04/2012  Findings: Endotracheal tube is 6.3 cm above the carina. Nasogastric tube extends into the abdomen.  Again noted are diffuse interstitial densities and possibly airspace disease in the right lower lung.  There is a cardiac pad overlying the left upper chest. Stable appearance of the cardiomediastinal silhouette.  IMPRESSION: Support apparatuses as described.  Diffuse interstitial lung densities.  Findings could represent interstitial pulmonary edema but infection cannot be excluded.   Original Report Authenticated By: Richarda Overlie, M.D.    Dg Chest Port 1 View  09/01/2012  *RADIOLOGY REPORT*  Clinical Data: PICC line placement.  PORTABLE CHEST - 1 VIEW  Comparison: Same date.  Findings: Interval placement of right-sided PICC line with distal tip in the expected position of the right atrium.  Stable mild cardiomegaly is noted.  Bilateral airspace opacities are noted concerning for pneumonia or edema.  Bony thorax is intact.  IMPRESSION: Continued presence of bilateral lung opacities concerning for pneumonia or edema.  Right-sided PICC line has been placed with distal tip in expected position of right atrium; it is recommended to be withdrawn at least 2 cm.   Original Report Authenticated By: Lupita Raider.,  M.D.    [facility] 1 View  09/01/2012  *RADIOLOGY REPORT*  Clinical Data: Pneumonia.  PORTABLE CHEST - 1 VIEW  Comparison: Chest x-ray 08/31/2012.  Findings: Lung volumes are low.  Severe multifocal interstitial and airspace disease is noted throughout all aspects of the lungs bilaterally, increased compared with yesterday's examination. There may  be small bilateral pleural effusions, however, the costophrenic sulci are excluded from the lower margin of the image. Cardiomegaly.  Cephalization of the pulmonary vasculature. The patient is rotated to the right on today's exam, resulting in distortion of the mediastinal contours and reduced diagnostic sensitivity and specificity for mediastinal pathology. Atherosclerosis of the thoracic aorta.  IMPRESSION: 1.  Persistent multifocal bilateral interstitial and airspace disease.  Given the cardiomegaly and cephalization of the pulmonary vasculature, findings are favored to reflect severe pulmonary edema related to congestive heart failure, however, superimposed multilobar pneumonia is difficult to exclude.   Original Report Authenticated By: Trudie Reed, M.D.    Dg Chest Port 1 View  08/31/2012  *RADIOLOGY REPORT*  Clinical Data: Pneumonia  PORTABLE CHEST - 1 VIEW  Comparison: 08/28/2012  Findings: Cardiac shadow remains mildly enlarged.  Patchy bilateral infiltrative changes are again seen.  Given some technical variations of the films there likely is stable.  Abnormality is seen.  IMPRESSION: Stable bilateral infiltrates.   Original Report Authenticated By: Alcide Clever, M.D.    Dg Chest Port 1 View  08/28/2012  *RADIOLOGY REPORT*  Clinical Data: 72 year old male with respiratory failure.  PORTABLE CHEST - 1 VIEW  Comparison: 08/27/2012 and earlier.  Findings: Portable semi upright AP view 0645 hours.  Interval regression of multilobar are and bilateral (right greater than left) fluffy pulmonary opacity. Stable cardiomegaly and mediastinal contours.  No large effusion.  No pneumothorax.  No areas of worsening ventilation.  IMPRESSION: Interval regressed but not resolved multilobar bilateral pulmonary opacity.  I favor asymmetric pulmonary edema over pneumonia. Cardiomegaly.   Original Report Authenticated By: Erskine Speed, M.D.    Dg Chest  Port 1 View  08/27/2012  *RADIOLOGY REPORT*  Clinical Data: To  torus failure  PORTABLE CHEST - 1 VIEW  Comparison: Portable chest x-ray of 08/26/2012  Findings: There is more patchy opacities bilaterally.  Although this could represent asymmetrical edema, pneumonia is a definite consideration.  Cardiomegaly is stable.  No bony abnormality is seen.  IMPRESSION: Some worsening of patchy airspace disease.  Possible asymmetrical edema but consider pneumonia.   Original Report Authenticated By: Dwyane Dee, M.D.     EKG: Rapid afib with LBBB Baseline ECG confirms chronic LBBB   ASSESSMENT AND PLAN:  PAF:  Now that he is extubated can add oral beta blockade.  Continue iv cardizem  Start amiodarone since he has converted with this before and has CAD.  INR supraRx on admission And now 2.38  I am concerned as patient does not get his INR checked with Dr Delbert Harness or Dr Jim Like as far as I can tell and would appear to be a poor candidate for long term anticoagulaton unless he can have it monitored. I doubt he can afford novel agents.  Dr Jim Like can address restarting coumadin in am.  Will ask pharmacy to start heparin in am to make sure INR does not become subRx incase DCC needed.   Anemia:  Concern for chronic coumadin also given low MCV likely iron deficiency anemia.  Follow Hct  Will need w/u for this [place] stools and consider gi f/u for colonoscopy CHF:  Previous EF reported as normal.  Current echo not interpretable.  Consider alternative form of evaluation such as MRI or MUGA  CXR clearling and BNP only mildly elevated No daily lasix written for.  Some of his changes may have been due to aspiration during obtunded state.   CAD:  No chest pain ECG not interpretable due to LBBB  Troponin negative Beta Blocker and ASA  Discussed with Dr Jim Like and [facility] who will assume his care in am  Signed: Charlton Haws 09/21/2012, 4:54 PM

## 2012-09-21 NOTE — Progress Notes (Signed)
ANTICOAGULATION CONSULT NOTE - Initial Consult  Pharmacy Consult for warfarin Indication: atrial fibrillation  No Known Allergies  Patient Measurements: Height: 5\' 7"  (170.2 cm) Weight: 228 lb 6.3 oz (103.6 kg) IBW/kg (Calculated) : 66.1  Vital Signs: Temp: 98.9 F (37.2 C) (04/14 0800) Temp src: Core (Comment) (04/14 0900) BP: 138/82 mmHg (04/14 1300) Pulse Rate: 101 (04/14 1202)  Labs:  Recent Labs  09/19/12 1300 09/20/12 0527 09/21/12 0450  HGB 9.3* 8.3* 8.7*  HCT 30.0* 25.5* 27.1*  PLT 275 227 234  LABPROT 53.8* 29.6* 24.9*  INR 6.73* 3.01* 2.38*  CREATININE 1.26 1.30 1.48*  TROPONINI <0.30  --   --     Estimated Creatinine Clearance: 54 ml/min (by C-G formula based on Cr of 1.48).   Medical History: Past Medical History  Diagnosis Date  . Coronary artery disease   . Hypertension   . Cancer   . Pneumonia   . CHF (congestive heart failure)   . High cholesterol   . Diabetes mellitus   . Hepatitis   . Renal disorder     kidney cancer  . LBBB (left bundle branch block)   . Paroxysmal atrial fibrillation   . Sleep apnea     Medications:  Prescriptions prior to admission  Medication Sig Dispense Refill  . cloNIDine (CATAPRES) 0.1 MG tablet Take 2 tablets (0.2 mg total) by mouth 3 (three) times daily.  60 tablet  3  . diltiazem (CARDIZEM SR) 120 MG 12 hr capsule Take 1 capsule (120 mg total) by mouth every 12 (twelve) hours.  60 capsule  3  . HYDROcodone-acetaminophen (NORCO) 10-325 MG per tablet Take 1 tablet by mouth every 6 (six) hours as needed for pain.      Marland Kitchen insulin glargine (LANTUS) 100 UNIT/ML injection Inject 40 Units into the skin at bedtime.      Marland Kitchen ipratropium (ATROVENT) 0.02 % nebulizer solution Take 0.5 mg by nebulization every 6 (six) hours as needed. Shortness of breath      . iron polysaccharides (NIFEREX) 150 MG capsule Take 1 capsule (150 mg total) by mouth 2 (two) times daily.  60 capsule  4  . methadone (DOLOPHINE) 5 MG tablet Take 5  mg by mouth daily.      . metoprolol (LOPRESSOR) 50 MG tablet Take 1 tablet (50 mg total) by mouth 2 (two) times daily.  60 tablet  3  . pantoprazole (PROTONIX) 40 MG tablet Take 40 mg by mouth daily.      . tamsulosin (FLOMAX) 0.4 MG CAPS Take 1 capsule (0.4 mg total) by mouth daily after supper.  30 capsule  3  . warfarin (COUMADIN) 7.5 MG tablet Take 1 tablet (7.5 mg total) by mouth daily.  30 tablet  1  . nitroGLYCERIN (NITROSTAT) 0.4 MG SL tablet Place 0.4 mg under the tongue every 5 (five) minutes as needed. Chest pain         Assessment: 14 yom admitted with a supratherapeutic INR. INR is now therapeutic and coumadin to restart today. Pt also initiated on vanc + zosyn but de-escalating to ceftriaxone today.   Goal of Therapy:  INR 2-3   Plan:  1. Coumadin 7.5mg  PO x 1 tonight 2. Daily INR 3. Ceftriaxone 1gm IV Q24H x 3 days - will sign off consult as no further dose adjustments anticipated. Thank you!  Natalin Bible, Drake Leach 09/21/2012,2:21 PM

## 2012-09-22 LAB — CBC
HCT: 28.7 % — ABNORMAL LOW (ref 39.0–52.0)
Hemoglobin: 9.1 g/dL — ABNORMAL LOW (ref 13.0–17.0)
MCH: 23.8 pg — ABNORMAL LOW (ref 26.0–34.0)
MCHC: 31.7 g/dL (ref 30.0–36.0)
MCV: 74.9 fL — ABNORMAL LOW (ref 78.0–100.0)
RBC: 3.83 MIL/uL — ABNORMAL LOW (ref 4.22–5.81)

## 2012-09-22 LAB — BASIC METABOLIC PANEL
BUN: 8 mg/dL (ref 6–23)
CO2: 29 mEq/L (ref 19–32)
Calcium: 8.4 mg/dL (ref 8.4–10.5)
Creatinine, Ser: 1.26 mg/dL (ref 0.50–1.35)
Glucose, Bld: 183 mg/dL — ABNORMAL HIGH (ref 70–99)
Sodium: 139 mEq/L (ref 135–145)

## 2012-09-22 LAB — GLUCOSE, CAPILLARY
Glucose-Capillary: 116 mg/dL — ABNORMAL HIGH (ref 70–99)
Glucose-Capillary: 106 mg/dL — ABNORMAL HIGH (ref 70–99)
Glucose-Capillary: 146 mg/dL — ABNORMAL HIGH (ref 70–99)
Glucose-Capillary: 128 mg/dL — ABNORMAL HIGH (ref 70–99)

## 2012-09-22 LAB — PROTIME-INR: INR: 2.67 — ABNORMAL HIGH (ref 0.00–1.49)

## 2012-09-22 MED ORDER — PANTOPRAZOLE SODIUM 40 MG PO TBEC
40.0000 mg | DELAYED_RELEASE_TABLET | Freq: Every day | ORAL | Status: DC
  Administered 2012-09-22 – 2012-10-05 (×12): 40 mg via ORAL
  Filled 2012-09-22 (×12): qty 1

## 2012-09-22 MED ORDER — POTASSIUM CHLORIDE CRYS ER 20 MEQ PO TBCR
40.0000 meq | EXTENDED_RELEASE_TABLET | Freq: Once | ORAL | Status: AC
  Administered 2012-09-22: 40 meq via ORAL
  Filled 2012-09-22: qty 2

## 2012-09-22 MED ORDER — AMIODARONE HCL 200 MG PO TABS
400.0000 mg | ORAL_TABLET | Freq: Two times a day (BID) | ORAL | Status: DC
  Administered 2012-09-22 – 2012-10-05 (×27): 400 mg via ORAL
  Filled 2012-09-22 (×29): qty 2

## 2012-09-22 MED ORDER — DILTIAZEM HCL 60 MG PO TABS
60.0000 mg | ORAL_TABLET | Freq: Four times a day (QID) | ORAL | Status: DC
  Administered 2012-09-22 – 2012-09-23 (×5): 60 mg via ORAL
  Filled 2012-09-22 (×11): qty 1

## 2012-09-22 MED ORDER — WARFARIN SODIUM 5 MG PO TABS
5.0000 mg | ORAL_TABLET | Freq: Once | ORAL | Status: AC
  Administered 2012-09-22: 5 mg via ORAL
  Filled 2012-09-22: qty 1

## 2012-09-22 MED ORDER — PNEUMOCOCCAL VAC POLYVALENT 25 MCG/0.5ML IJ INJ
0.5000 mL | INJECTION | INTRAMUSCULAR | Status: AC
  Administered 2012-09-23: 0.5 mL via INTRAMUSCULAR
  Filled 2012-09-22: qty 0.5

## 2012-09-22 NOTE — Progress Notes (Signed)
[facility]  Patient Status: Active (receiving services up to time of hospitalization)  [facility] is providing the following services: RN, PT and MSW  If patient discharges after hours, please call [phone].   [facility] 09/22/2012, 11:09 AM

## 2012-09-22 NOTE — Progress Notes (Addendum)
PULMONARY  / CRITICAL CARE MEDICINE DAILY PROGRESS NOTE  Name: ANA LIAW MRN: [id number] DOB: [birth year]    ADMISSION DATE:  09/19/2012  BRIEF PATIENT DESCRIPTION:  72 yo male brought to [facility] ED with altered mental status.  Intubated for airway protection.  Transferred to [facility] for further Tx under PCCM service.  Had recent admit for aspiration PNA, A fib with RVR. 4/12 Transfer to [facility]. Went into another episode A fib with RVR on 4/14.   STUDIES:  CT chest 4/12 >> negative for acute bleed  LINES / TUBES: ETT 4/12 >> 4/13  CULTURES: Blood 4/12 >> Pending Urine 4/12 >> No growth Sputum 4/12 >> Pending  ANTIBIOTICS: Vancomycin 4/12 >>4/14 Zosyn 4/12 >>4/12 Ceftriaxone 4/14>>>plan stop 4/16  SUBJECTIVE:  Patient is alert. Obeys commands and interactive. He denies chest pain overnight. Telemetry no acute events with heart rate remained well controlled. Remain son cardizem and amio drips  VITAL SIGNS: Temp:  [98.1 F (36.7 C)-98.9 F (37.2 C)] 98.1 F (36.7 C) (04/15 0334) Pulse Rate:  [72-155] 82 (04/15 0700) Resp:  [15-28] 20 (04/15 0700) BP: (118-160)/(58-115) 139/73 mmHg (04/15 0700) SpO2:  [93 %-98 %] 97 % (04/15 0700) HEMODYNAMICS:   VENTILATOR SETTINGS:   INTAKE / OUTPUT: Intake/Output     04/14 0701 - 04/15 0700 04/15 0701 - 04/16 0700   P.O. 1080    I.V. (mL/kg) 1329.8 (12.8)    IV Piggyback 62.5    Total Intake(mL/kg) 2472.3 (23.9)    Urine (mL/kg/hr) 3625 (1.5)    Total Output 3625     Net -1152.7          Urine Occurrence 2 x    Stool Occurrence 1 x      PHYSICAL EXAMINATION: General:  Alert and obeys commands. Not in distress.  Neuro:  Alert and O X 3  HEENT:  Pupils pin point.  Cardiovascular: irregular Lungs: scattered rhonchi Abdomen:  Soft, non tender Musculoskeletal:  No edema Skin: no rashes  LABS:  Recent Labs Lab 09/19/12 1300 09/19/12 1423 09/20/12 0527 09/21/12 0450 09/22/12 0345  HGB 9.3*  --  8.3* 8.7* 9.1*  WBC 4.0  --   2.8* 4.0 5.0  PLT 275  --  227 234 214  NA 135  --  143 140 139  K 4.5  --  4.0 4.1 3.6  CL 96  --  102 101 102  CO2 29  --  34* 32 29  GLUCOSE 139*  --  66* 145* 183*  BUN 14  --  13 12 8   CREATININE 1.26  --  1.30 1.48* 1.26  CALCIUM 9.0  --  8.7 8.4 8.4  AST 12  --  11  --   --   ALT 16  --  11  --   --   ALKPHOS 108  --  82  --   --   BILITOT 0.4  --  0.5  --   --   PROT 7.9  --  6.2  --   --   ALBUMIN 3.4*  --  2.6*  --   --   INR 6.73*  --  3.01* 2.38* 2.67*  LATICACIDVEN 2.7*  --   --   --   --   TROPONINI <0.30  --   --   --   --   PROBNP 681.2*  --  796.8*  --   --   PHART  --  7.441  --   --   --  PCO2ART  --  47.4*  --   --   --   PO2ART  --  75.5*  --   --   --     Recent Labs Lab 09/21/12 0803 09/21/12 1132 09/21/12 1536 09/21/12 1914 09/21/12 2241  GLUCAP 87 156* 127* 182* 110*    Imaging: No results found.   ASSESSMENT / PLAN:  PULMONARY A: Acute respiratory failure 2nd to pulmonary edema +/- PNA. Improving. Hx of OSA. P:   - 4/13 > Extubated. - CPAP qhs - oxygen to keep SpO2 > 92% Neg balance ok   CARDIOVASCULAR A: A fib with RVR on 4/15.   HTN emergency with acute pulmonary edema -resolved  Hx of CAD, Hyperlipidemia. P:  -monitor heart rhythm -4/14 >>Restart Cardizem Amiodarone 4/14 >>add oral 400 bid goal to wean drip Cardizem drip 4/14 >>add 60 q6h , titrate drip off slowly NTG prn for chest pain  PO Metoprolol 25 mg, also have option to increase if needed Cardiology following    RENAL A:  Hypervolemia. Creatinine improved 1.48 > 1.27. S/p nephrectomy.  P:   -monitor renal fx, urine outpt, electrolytes -foley in for now - KVO Replace k above 4 as goal   GASTROINTESTINAL A:  Hx of Hepatis. Normal liver enzymes  P:   -advance diet as tolerated -Protonix for SUP  HEMATOLOGIC A:  Anemia of chronic disease. Hgb 8.3. INR improving 6.73 >>>3.1 >2.8. Coagulopathy 2nd to coumadin for A fib. P:  -daily CBC -Trend INR  daily - Warfarin per pharmacy restarted 4/14   INFECTIOUS A:  Recent therapy for aspiration PNA with persistent infiltrate on CXR. Respiratory and blood cultures - Pending. P:   D/ced Vancomycin, zosyn Continue with ceftriaxone, stop date 4/17, unclear if infection present  ENDOCRINE A:  DM type II.   P:   -SSI  NEUROLOGIC A:  Acute encephalopathy - resolved. ? Cause >> may be from opiates, HTN encephalopathy, hypoxia.  CT head negative for acute findings. P:   -monitor mental status -limit opiate use as tolerated Pt when rate controlled  Signed:  Dow Adolph, MD PGY-1 Internal Medicine Teaching Service Pager: [phone] 09/22/2012, 7:56 AM   Ccm time vasoactives  30 min  I have fully examined this patient and agree with above findings.    And edited infull  Mcarthur Rossetti. Tyson Alias, MD, FACP Pgr: [phone] [facility]

## 2012-09-22 NOTE — Progress Notes (Signed)
Received patient awake  In bed and watching TV. Pt denies pain or discomfort but inform writer that he will be moving to another unit in AM.Amiodarone gtt and Cardizem gtt. Infusing via PIV and voiding via urinal. We will continue to monitor.

## 2012-09-22 NOTE — Progress Notes (Signed)
ANTICOAGULATION CONSULT NOTE - Initial Consult  Pharmacy Consult for warfarin Indication: atrial fibrillation  No Known Allergies  Patient Measurements: Height: 5\' 7"  (170.2 cm) Weight: 228 lb 6.3 oz (103.6 kg) IBW/kg (Calculated) : 66.1  Vital Signs: Temp: 98.1 F (36.7 C) (04/15 0834) Temp src: Oral (04/15 0834) BP: 139/73 mmHg (04/15 0700) Pulse Rate: 82 (04/15 0700)  Labs:  Recent Labs  09/19/12 1300 09/20/12 0527 09/21/12 0450 09/22/12 0345  HGB 9.3* 8.3* 8.7* 9.1*  HCT 30.0* 25.5* 27.1* 28.7*  PLT 275 227 234 214  LABPROT 53.8* 29.6* 24.9* 27.1*  INR 6.73* 3.01* 2.38* 2.67*  CREATININE 1.26 1.30 1.48* 1.26  TROPONINI <0.30  --   --   --     Estimated Creatinine Clearance: 63.5 ml/min (by C-G formula based on Cr of 1.26).  Assessment: 69 yom admitted with a supratherapeutic INR. INR is now therapeutic and coumadin was restarted yesterday. CBC is stable, no bleeding noted. Cardiology is concerned with using coumadin in this patient. Will need to clarify   Goal of Therapy:  INR 2-3   Plan:  1. Coumadin 5mg  PO x 1 tonight 2. Daily INR 3. F/u plans for anticoagulation  Norbert Malkin, Drake Leach 09/22/2012,8:35 AM

## 2012-09-22 NOTE — Progress Notes (Signed)
SUBJECTIVE:  Feels much better today.  Converted to normal sinus rhythm on IV amiodarone  OBJECTIVE:   Vitals:   Filed Vitals:   09/22/12 0600 09/22/12 0700 09/22/12 0800 09/22/12 0834  BP: 150/78 139/73 149/69   Pulse: 82 82 84   Temp:    98.1 F (36.7 C)  TempSrc:    Oral  Resp: 15 20 16    Height:      Weight:      SpO2: 98% 97% 97%    I&O's:   Intake/Output Summary (Last 24 hours) at 09/22/12 9604 Last data filed at 09/22/12 0900  Gross per 24 hour  Intake 2109.84 ml  Output   3275 ml  Net -1165.16 ml   TELEMETRY: Reviewed telemetry pt in normal sinus rhythm with bundle branch block:     PHYSICAL EXAM General: Well developed, well nourished, in no acute distress Head: Normal cephalic and atramatic  Lungs:   Clear bilaterally to auscultation and percussion. Heart:  HRRR S1 S2  Abdomen: Bowel sounds are positive, abdomen soft and non-tender Msk:  . Normal strength and tone for age. Extremities:   No  edema.   Neuro: Alert and oriented X 3. Psych:  Normal affect, responds appropriately   LABS: Basic Metabolic Panel:  Recent Labs  54/09/81 0450 09/22/12 0345  NA 140 139  K 4.1 3.6  CL 101 102  CO2 32 29  GLUCOSE 145* 183*  BUN 12 8  CREATININE 1.48* 1.26  CALCIUM 8.4 8.4   Liver Function Tests:  Recent Labs  09/19/12 1300 09/20/12 0527  AST 12 11  ALT 16 11  ALKPHOS 108 82  BILITOT 0.4 0.5  PROT 7.9 6.2  ALBUMIN 3.4* 2.6*   No results found for this basename: LIPASE, AMYLASE,  in the last 72 hours CBC:  Recent Labs  09/19/12 1300  09/21/12 0450 09/22/12 0345  WBC 4.0  < > 4.0 5.0  NEUTROABS 2.8  --   --   --   HGB 9.3*  < > 8.7* 9.1*  HCT 30.0*  < > 27.1* 28.7*  MCV 78.3  < > 75.3* 74.9*  PLT 275  < > 234 214  < > = values in this interval not displayed. Cardiac Enzymes:  Recent Labs  09/19/12 1300  TROPONINI <0.30   BNP: No components found with this basename: POCBNP,  D-Dimer: No results found for this basename: DDIMER,   in the last 72 hours Hemoglobin A1C: No results found for this basename: HGBA1C,  in the last 72 hours Fasting Lipid Panel: No results found for this basename: CHOL, HDL, LDLCALC, TRIG, CHOLHDL, LDLDIRECT,  in the last 72 hours Thyroid Function Tests: No results found for this basename: TSH, T4TOTAL, FREET3, T3FREE, THYROIDAB,  in the last 72 hours Anemia Panel: No results found for this basename: VITAMINB12, FOLATE, FERRITIN, TIBC, IRON, RETICCTPCT,  in the last 72 hours Coag Panel:   Lab Results  Component Value Date   INR 2.67* 09/22/2012   INR 2.38* 09/21/2012   INR 3.01* 09/20/2012    RADIOLOGY: Dg Chest 1 View  08/26/2012  *RADIOLOGY REPORT*  Clinical Data: Aspiration.  Patient undergoing CPAP.  CHEST - 1 VIEW  Comparison: Portable chest x-ray 08/21/2012, 06/11/2011.  Two-view chest x-ray 02/08/2011.  Findings: Suboptimal inspiration accounts for crowded bronchovascular markings, especially in the lung bases, and accentuates the cardiac silhouette.  Taking this into account, cardiac silhouette moderately enlarged but stable.  Interval development of airspace consolidation in the right upper  lobe and pulmonary venous hypertension since the examination 5 days ago.  No overt pulmonary edema.  IMPRESSION: Right upper lobe pneumonia.  Pulmonary venous hypertension without overt edema.  Stable cardiomegaly.   Original Report Authenticated By: Hulan Saas, M.D.    Dg Chest 2 View  09/04/2012  *RADIOLOGY REPORT*  Clinical Data: Assess infiltrates  CHEST - 2 VIEW  Comparison: 09/03/2012  Findings: Right-sided PICC line is again seen.  Bilateral infiltrates are again noted and stable.  Cardiac shadow is stable.  IMPRESSION: Stable bilateral infiltrative change   Original Report Authenticated By: Alcide Clever, M.D.    Dg Chest 2 View  09/03/2012  *RADIOLOGY REPORT*  Clinical Data: Previously seen infiltrate, check for resolution  CHEST - 2 VIEW  Comparison: 09/01/2012  Findings: The cardiac shadow  is again enlarged.  Bilateral infiltrates are again seen and stable.  The right-sided PICC line is again noted.  No new focal abnormality is seen.  IMPRESSION: Stable bilateral infiltrates   Original Report Authenticated By: Alcide Clever, M.D.    Ct Head Wo Contrast  09/19/2012  *RADIOLOGY REPORT*  Clinical Data: Altered level of consciousness.  Atrial fibrillation.  Insulin dependent diabetes.  Hypertension.  CT HEAD WITHOUT CONTRAST  Technique:  Contiguous axial images were obtained from the base of the skull through the vertex without contrast.  Comparison: 08/25/2012.  Findings: Mild atrophy and chronic microvascular ischemic change. No acute cortical infarction or hemorrhage.  No mass lesion or hydrocephalus.  Right  thalamic and right putaminal lacunes.  Left middle cranial fossa arachnoid cyst. Vascular calcifications. Nasopharyngeal adenoidal hypertrophy or dependent nasopharyngeal fluid.  Calvarium intact.  No acute sinus or mastoid disease.  IMPRESSION: Chronic changes as described.  No acute intracranial findings. Stable appearance from priors.   Original Report Authenticated By: Davonna Belling, M.D.    Ct Head Wo Contrast  08/25/2012  *RADIOLOGY REPORT*  Clinical Data: Evaluate for bleed or infarct.  On Coumadin. Altered mental status.  History high blood pressure.  CT HEAD WITHOUT CONTRAST  Technique:  Contiguous axial images were obtained from the base of the skull through the vertex without contrast.  Comparison: 04/26/2007.  Findings: No intracranial hemorrhage.  Remote infarct right lenticular nucleus.  Small vessel disease type changes without CT evidence of large acute infarct.  Global atrophy without hydrocephalus.  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  No other intracranial mass lesion noted on this unenhanced exam.  Vascular calcifications.  Mastoid air cells, middle ear cavities and visualized paranasal sinuses are clear.  Mild exophthalmos.  IMPRESSION: No  intracranial hemorrhage or CT evidence of large acute infarct. Please see above.   Original Report Authenticated By: Lacy Duverney, M.D.    US Renal  08/26/2012  *RADIOLOGY REPORT*  Clinical Data: Renal insufficiency. 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  RENAL/URINARY TRACT ULTRASOUND COMPLETE  Comparison:  CT scan dated 03/09/2012  Findings:  Right Kidney:  Removed.  Left Kidney:  14.0 cm in length.  1 cm cyst on the lateral aspect of the lower pole, unchanged.  No hydronephrosis or renal cortical thinning.  Bladder:  The bladder is empty with a Foley catheter in place.  IMPRESSION: Normal appearing left kidney.  Stable tiny cyst in the lower pole.   Original Report Authenticated By: Francene Boyers, M.D.    Dg Chest Port 1 View  09/20/2012  *RADIOLOGY REPORT*  Clinical Data: Ventilator dependent respiratory failure.  Follow up CHF.  PORTABLE CHEST - 1 VIEW 09/20/2012 0537 hours:  Comparison: Portable chest x-ray yesterday and 09/01/2012  Findings: Endotracheal tube tip in satisfactory position approximately 5 cm above carina.  Nasogastric tube courses below the diaphragm into the stomach.  Cardiac silhouette enlarged but stable.  Interval slight improvement in the interstitial and airspace pulmonary edema, though moderate edema persist.  New dense atelectasis in both lower lobes.  IMPRESSION: Support apparatus satisfactory.  Slight improvement in CHF, though moderate interstitial and airspace edema persists.  New dense bilateral lower lobe atelectasis.  Cough,   Original Report Authenticated By: Hulan Saas, M.D.    Dg Chest Portable 1 View  09/19/2012  *RADIOLOGY REPORT*  Clinical Data: Shortness of breath and post intubation.  PORTABLE CHEST - 1 VIEW  Comparison: 09/04/2012  Findings: Endotracheal tube is 6.3 cm above the carina. Nasogastric tube extends into the abdomen.  Again noted are diffuse interstitial densities and possibly airspace disease in the right lower lung.  There is a  cardiac pad overlying the left upper chest. Stable appearance of the cardiomediastinal silhouette.  IMPRESSION: Support apparatuses as described.  Diffuse interstitial lung densities.  Findings could represent interstitial pulmonary edema but infection cannot be excluded.   Original Report Authenticated By: Richarda Overlie, M.D.    Dg Chest Port 1 View  09/01/2012  *RADIOLOGY REPORT*  Clinical Data: PICC line placement.  PORTABLE CHEST - 1 VIEW  Comparison: Same date.  Findings: Interval placement of right-sided PICC line with distal tip in the expected position of the right atrium.  Stable mild cardiomegaly is noted.  Bilateral airspace opacities are noted concerning for pneumonia or edema.  Bony thorax is intact.  IMPRESSION: Continued presence of bilateral lung opacities concerning for pneumonia or edema.  Right-sided PICC line has been placed with distal tip in expected position of right atrium; it is recommended to be withdrawn at least 2 cm.   Original Report Authenticated By: Lupita Raider.,  M.D.    [facility] 1 View  09/01/2012  *RADIOLOGY REPORT*  Clinical Data: Pneumonia.  PORTABLE CHEST - 1 VIEW  Comparison: Chest x-ray 08/31/2012.  Findings: Lung volumes are low.  Severe multifocal interstitial and airspace disease is noted throughout all aspects of the lungs bilaterally, increased compared with yesterday's examination. There may be small bilateral pleural effusions, however, the costophrenic sulci are excluded from the lower margin of the image. Cardiomegaly.  Cephalization of the pulmonary vasculature. The patient is rotated to the right on today's exam, resulting in distortion of the mediastinal contours and reduced diagnostic sensitivity and specificity for mediastinal pathology. Atherosclerosis of the thoracic aorta.  IMPRESSION: 1.  Persistent multifocal bilateral interstitial and airspace disease.  Given the cardiomegaly and cephalization of the pulmonary vasculature, findings are favored to  reflect severe pulmonary edema related to congestive heart failure, however, superimposed multilobar pneumonia is difficult to exclude.   Original Report Authenticated By: Trudie Reed, M.D.    Dg Chest Port 1 View  08/31/2012  *RADIOLOGY REPORT*  Clinical Data: Pneumonia  PORTABLE CHEST - 1 VIEW  Comparison: 08/28/2012  Findings: Cardiac shadow remains mildly enlarged.  Patchy bilateral infiltrative changes are again seen.  Given some technical variations of the films there likely is stable.  Abnormality is seen.  IMPRESSION: Stable bilateral infiltrates.   Original Report Authenticated By: Alcide Clever, M.D.    Dg Chest Port 1 View  08/28/2012  *RADIOLOGY REPORT*  Clinical Data: 72 year old male with respiratory failure.  PORTABLE CHEST - 1 VIEW  Comparison: 08/27/2012 and earlier.  Findings: Portable semi upright  AP view 0645 hours.  Interval regression of multilobar are and bilateral (right greater than left) fluffy pulmonary opacity. Stable cardiomegaly and mediastinal contours.  No large effusion.  No pneumothorax.  No areas of worsening ventilation.  IMPRESSION: Interval regressed but not resolved multilobar bilateral pulmonary opacity.  I favor asymmetric pulmonary edema over pneumonia. Cardiomegaly.   Original Report Authenticated By: Erskine Speed, M.D.    Dg Chest Port 1 View  08/27/2012  *RADIOLOGY REPORT*  Clinical Data: To torus failure  PORTABLE CHEST - 1 VIEW  Comparison: Portable chest x-ray of 08/26/2012  Findings: There is more patchy opacities bilaterally.  Although this could represent asymmetrical edema, pneumonia is a definite consideration.  Cardiomegaly is stable.  No bony abnormality is seen.  IMPRESSION: Some worsening of patchy airspace disease.  Possible asymmetrical edema but consider pneumonia.   Original Report Authenticated By: Dwyane Dee, M.D.       ASSESSMENT: Atrial fibrillation, coronary artery disease  PLAN:  Atrial fibrillation: I last saw the patient in the  office in February 2013.  At that time he was on atenolol and Cardizem and maintaining sinus rhythm.  He gives a convoluted story about all of his medicines being changed by his doctor.  He had a friend who picked up these medicines.  He is concerned that the friend tried to overdose him.  It's very unclear to me what he was actually taking prior to coming into the hospital.  Regardless, it appears he will need an antiarrhythmic to maintain sinus rhythm.  He definitely feels when he goes out of rhythm.    Anticoagulation: When I saw him in February of 2013, he was on aspirin and Plavix due to a drug-eluting stent that was placed a few years ago.  We did not put him on Coumadin.  Dr. Ricki Miller note from yesterday states that he has been on Coumadin.  We have not been following his INR.  His primary care doctor may have been doing this in the past.  Coumadin is certainly reasonable for his risk of stroke.  I am also concerned that without proper monitoring, he could have a bleeding complication.  If good followup cannot be arranged, a novel oral anticoagulant such as Xarelto or Eliquis could be used if he is able to obtain this medication.  Will likely need social work help to figure out his home situation.  For now, could switch IV Cardizem to oral Cardizem.  Would try 60 mg by mouth every 6 hours.  When current bottle of IV amiodarone has run out, could switch to amiodarone by mouth 400 mg twice a day.    He previously was on atenolol.  This apparently has been switched to metoprolol as an outpatient.  Either medication would be fine.   If his rhythm stabilizes on oral medication, he can be transferred to telemetry.  Corky Crafts., MD  09/22/2012  9:09 AM

## 2012-09-23 LAB — CBC
HCT: 29.4 % — ABNORMAL LOW (ref 39.0–52.0)
Hemoglobin: 9.2 g/dL — ABNORMAL LOW (ref 13.0–17.0)
MCH: 23.7 pg — ABNORMAL LOW (ref 26.0–34.0)
MCHC: 31.3 g/dL (ref 30.0–36.0)
MCV: 75.6 fL — ABNORMAL LOW (ref 78.0–100.0)
RBC: 3.89 MIL/uL — ABNORMAL LOW (ref 4.22–5.81)
RDW: 17.9 % — ABNORMAL HIGH (ref 11.5–15.5)
WBC: 6.4 10*3/uL (ref 4.0–10.5)

## 2012-09-23 LAB — GLUCOSE, CAPILLARY
Glucose-Capillary: 134 mg/dL — ABNORMAL HIGH (ref 70–99)
Glucose-Capillary: 138 mg/dL — ABNORMAL HIGH (ref 70–99)

## 2012-09-23 LAB — PROTIME-INR
INR: 2.63 — ABNORMAL HIGH (ref 0.00–1.49)
Prothrombin Time: 26.8 seconds — ABNORMAL HIGH (ref 11.6–15.2)

## 2012-09-23 LAB — BASIC METABOLIC PANEL
BUN: 12 mg/dL (ref 6–23)
CO2: 26 mEq/L (ref 19–32)
Chloride: 106 mEq/L (ref 96–112)
Creatinine, Ser: 1.25 mg/dL (ref 0.50–1.35)
GFR calc non Af Amer: 57 mL/min — ABNORMAL LOW (ref >90)
Glucose, Bld: 147 mg/dL — ABNORMAL HIGH (ref 70–99)
Potassium: 4.7 mEq/L (ref 3.5–5.1)

## 2012-09-23 MED ORDER — DILTIAZEM HCL 90 MG PO TABS
90.0000 mg | ORAL_TABLET | Freq: Four times a day (QID) | ORAL | Status: DC
  Administered 2012-09-23 – 2012-09-27 (×15): 90 mg via ORAL
  Filled 2012-09-23 (×19): qty 1

## 2012-09-23 MED ORDER — DILTIAZEM HCL 30 MG PO TABS
30.0000 mg | ORAL_TABLET | Freq: Once | ORAL | Status: AC
  Administered 2012-09-23: 30 mg via ORAL
  Filled 2012-09-23: qty 1

## 2012-09-23 MED ORDER — WARFARIN SODIUM 5 MG PO TABS
5.0000 mg | ORAL_TABLET | Freq: Every day | ORAL | Status: DC
  Administered 2012-09-23 – 2012-09-26 (×4): 5 mg via ORAL
  Filled 2012-09-23 (×5): qty 1

## 2012-09-23 MED ORDER — METOPROLOL TARTRATE 50 MG PO TABS
50.0000 mg | ORAL_TABLET | Freq: Two times a day (BID) | ORAL | Status: DC
  Administered 2012-09-23 – 2012-10-05 (×23): 50 mg via ORAL
  Filled 2012-09-23 (×26): qty 1

## 2012-09-23 NOTE — Progress Notes (Signed)
ANTICOAGULATION CONSULT NOTE - Follow Up Consult  Pharmacy Consult for Coumadin Indication: atrial fibrillation  No Known Allergies  Patient Measurements: Height: 5\' 7"  (170.2 cm) Weight: 223 lb 8.7 oz (101.4 kg) IBW/kg (Calculated) : 66.1  Vital Signs: Temp: 97.8 F (36.6 C) (04/16 0415) Temp src: Oral (04/16 0415) BP: 138/83 mmHg (04/16 0415) Pulse Rate: 110 (04/16 0415)  Labs:  Recent Labs  09/21/12 0450 09/22/12 0345 09/23/12 0435  HGB 8.7* 9.1* 9.2*  HCT 27.1* 28.7* 29.4*  PLT 234 214 234  LABPROT 24.9* 27.1* 26.8*  INR 2.38* 2.67* 2.63*  CREATININE 1.48* 1.26 1.25    Estimated Creatinine Clearance: 63.3 ml/min (by C-G formula based on Cr of 1.25).   Medications:  Scheduled:  . amiodarone  400 mg Oral BID  . antiseptic oral rinse  15 mL Mouth Rinse QID  . cefTRIAXone (ROCEPHIN)  IV  1 g Intravenous Q24H  . chlorhexidine  15 mL Mouth Rinse BID  . diltiazem  60 mg Oral Q6H  . insulin aspart  0-20 Units Subcutaneous TID AC & HS  . metoprolol tartrate  25 mg Oral BID  . pantoprazole  40 mg Oral Daily  . [COMPLETED] pneumococcal 23 valent vaccine  0.5 mL Intramuscular Tomorrow-1000  . [COMPLETED] potassium chloride  40 mEq Oral Once  . [COMPLETED] warfarin  5 mg Oral ONCE-1800  . Warfarin - Pharmacist Dosing Inpatient   Does not apply q1800    Assessment: [name] on Coumadin PTA for afib.  Presented with INR>6 (no bleeding) and received 5mg  Vit K.  INR now therapeutic and Coumadin restarted 4/14.  Home Coumadin dose of 7.5 mg daily was too high based on presenting INR and new addition of Amiodarone can also elevate INR.  Will attempt Coumadin 5mg  daily as maintenance regimen and continue to monitor daily INR while in hospital.  Goal of Therapy:  INR 2-3   Plan:  Coumadin 5mg  PO daily. Daily INR.  [employer], Pharm.D., BCPS Clinical Pharmacist Pager [phone] 09/23/2012 11:25 AM

## 2012-09-23 NOTE — Progress Notes (Signed)
Pt has been back and forth in Afib and sinus rhythm. Pt has been in Afib most of the morning with rates as high as 140. Pt states he feels it in his chest and is sweating. BP 153/80. Notified MD of this and ordered 90mg  PO cardizem. Will continue to monitor.

## 2012-09-23 NOTE — Progress Notes (Signed)
[facility] PROGRESS NOTE  [name] RUE:[id number] DOB: 1940/09/30 DOA: 09/19/2012 PCP: No primary provider on file.  Assessment/Plan:   BRIEF PATIENT DESCRIPTION:  72 yo male brought to [facility] ED with altered mental status. Intubated for airway protection. Transferred to [facility] for further Tx under PCCM service. Had recent admit for aspiration PNA, A fib with RVR. 4/12 Transfer to [facility]. Went into another episode A fib with RVR on 4/14.  STUDIES:  CT chest 4/12 >> negative for acute bleed  LINES / TUBES:  ETT 4/12 >> 4/13  CULTURES:  Blood 4/12 >> Pending  Urine 4/12 >> No growth  Sputum 4/12 >> Pending    ANTIBIOTICS:  Vancomycin 4/12 >>4/14  Zosyn 4/12 >>4/12  Ceftriaxone 4/14>>>plan stop 4/16    Atrial fibrillation he was on atenolol and Cardizem and maintaining sinus rhythm. he was on aspirin and Plavix due to a drug-eluting stent that was placed a few years ago. We did not put him on Coumadin.   Coumadin is certainly reasonable for his risk of stroke. I am also concerned that without proper monitoring, he could have a bleeding complication. If good followup cannot be arranged, a novel oral anticoagulant such as Xarelto or Eliquis could be used if he is able to obtain this medication. switch IV Cardizem to oral Cardizem. Would try 60 mg by mouth every 6 hours switch to amiodarone by mouth 400 mg twice a day.   Acute respiratory failure 2nd to pulmonary edema +/- PNA. Improving.  Hx of OSA. Extubated, stable oxygen requirements   Creatinine improved 1.48 > 1.27.  S/p nephrectomy.  Creatinine is stable  Hx of Hepatis. Normal liver enzymes   Anemia of chronic disease. Hgb 8.3. INR improving 6.73 >>>3.1 >2.8.  Coagulopathy 2nd to coumadin for A fib. Monitor INR, may need to be switched to xarelto , if affordable   Recent therapy for aspiration PNA with persistent infiltrate on CXR. Respiratory and blood cultures - Pending D/ced Vancomycin, zosyn  Continue with  ceftriaxone, stop date 4/17, unclear if infection present   Diabetes mellitus type 2, continue SSI   Acute encephalopathy - resolved. ? Cause >> may be from opiates, HTN encephalopathy, hypoxia. CT head negative for acute findings Improving    Code Status: full Family Communication: family updated about patient's clinical progress Disposition Plan:  As above     Consultants:  Cardiology  Critical care   HPI/Subjective: Occasional episodes of tachycardia, in and out of atrial fibrillation last night, In sinus rhythm this morning  Objective: Filed Vitals:   09/22/12 1540 09/22/12 1630 09/22/12 2107 09/23/12 0415  BP:  161/74 157/70 138/83  Pulse:  92 99 110  Temp: 98.7 F (37.1 C) 98.3 F (36.8 C) 98.3 F (36.8 C) 97.8 F (36.6 C)  TempSrc: Oral Oral Oral Oral  Resp:   18 18  Height:      Weight:    101.4 kg (223 lb 8.7 oz)  SpO2:  100% 99% 99%    Intake/Output Summary (Last 24 hours) at 09/23/12 0929 Last data filed at 09/23/12 0843  Gross per 24 hour  Intake 1705.2 ml  Output   2475 ml  Net -769.8 ml    Exam:  HENT:  General: Alert and obeys commands. Not in distress.  Neuro: Alert and O X 3  HEENT: Pupils pin point.  Cardiovascular: irregular  Lungs: scattered rhonchi  Abdomen: Soft, non tender  Musculoskeletal: No edema  Skin: no rashes    Data Reviewed:  Basic Metabolic Panel:  Recent Labs Lab 09/19/12 1300 09/20/12 0527 09/21/12 0450 09/22/12 0345 09/23/12 0435  NA 135 143 140 139 139  K 4.5 4.0 4.1 3.6 4.7  CL 96 102 101 102 106  CO2 29 34* 32 29 26  GLUCOSE 139* 66* 145* 183* 147*  BUN 14 13 12 8 12   CREATININE 1.26 1.30 1.48* 1.26 1.25  CALCIUM 9.0 8.7 8.4 8.4 8.7    Liver Function Tests:  Recent Labs Lab 09/19/12 1300 09/20/12 0527  AST 12 11  ALT 16 11  ALKPHOS 108 82  BILITOT 0.4 0.5  PROT 7.9 6.2  ALBUMIN 3.4* 2.6*   No results found for this basename: LIPASE, AMYLASE,  in the last 168 hours No results  found for this basename: AMMONIA,  in the last 168 hours  CBC:  Recent Labs Lab 09/19/12 1300 09/20/12 0527 09/21/12 0450 09/22/12 0345 09/23/12 0435  WBC 4.0 2.8* 4.0 5.0 6.4  NEUTROABS 2.8  --   --   --   --   HGB 9.3* 8.3* 8.7* 9.1* 9.2*  HCT 30.0* 25.5* 27.1* 28.7* 29.4*  MCV 78.3 75.2* 75.3* 74.9* 75.6*  PLT 275 227 234 214 234    Cardiac Enzymes:  Recent Labs Lab 09/19/12 1300  TROPONINI <0.30   BNP (last 3 results)  Recent Labs  08/21/12 1219 09/19/12 1300 09/20/12 0527  PROBNP 516.5* 681.2* 796.8*     CBG:  Recent Labs Lab 09/22/12 1228 09/22/12 1533 09/22/12 1635 09/22/12 2106 09/23/12 0615  GLUCAP 106* 147* 146* 128* 134*    Recent Results (from the past 240 hour(s))  CULTURE, BLOOD (ROUTINE X 2)     Status: None   Collection Time    09/19/12  1:00 PM      Result Value Range Status   Specimen Description BLOOD RIGHT ANTECUBITAL   Final   Special Requests BOTTLES DRAWN AEROBIC AND ANAEROBIC 4CC   Final   Culture NO GROWTH 3 DAYS   Final   Report Status PENDING   Incomplete  CULTURE, BLOOD (ROUTINE X 2)     Status: None   Collection Time    09/19/12  1:30 PM      Result Value Range Status   Specimen Description BLOOD RIGHT ANTECUBITAL   Final   Special Requests BOTTLES DRAWN AEROBIC AND ANAEROBIC 4CC   Final   Culture NO GROWTH 3 DAYS   Final   Report Status PENDING   Incomplete  URINE CULTURE     Status: None   Collection Time    09/19/12  2:07 PM      Result Value Range Status   Specimen Description URINE, CATHETERIZED   Final   Special Requests NONE   Final   Culture  Setup Time 09/19/2012 20:51   Final   Colony Count NO GROWTH   Final   Culture NO GROWTH   Final   Report Status 09/20/2012 FINAL   Final  MRSA PCR SCREENING     Status: None   Collection Time    09/19/12  5:41 PM      Result Value Range Status   MRSA by PCR NEGATIVE  NEGATIVE Final   Comment:            The GeneXpert MRSA Assay (FDA     approved for NASAL  specimens     only), is one component of a     comprehensive MRSA colonization     surveillance program. It is not  intended to diagnose MRSA     infection nor to guide or     monitor treatment for     MRSA infections.     Studies: Dg Chest 1 View  08/26/2012  *RADIOLOGY REPORT*  Clinical Data: Aspiration.  Patient undergoing CPAP.  CHEST - 1 VIEW  Comparison: Portable chest x-ray 08/21/2012, 06/11/2011.  Two-view chest x-ray 02/08/2011.  Findings: Suboptimal inspiration accounts for crowded bronchovascular markings, especially in the lung bases, and accentuates the cardiac silhouette.  Taking this into account, cardiac silhouette moderately enlarged but stable.  Interval development of airspace consolidation in the right upper lobe and pulmonary venous hypertension since the examination 5 days ago.  No overt pulmonary edema.  IMPRESSION: Right upper lobe pneumonia.  Pulmonary venous hypertension without overt edema.  Stable cardiomegaly.   Original Report Authenticated By: Hulan Saas, M.D.    Dg Chest 2 View  09/04/2012  *RADIOLOGY REPORT*  Clinical Data: Assess infiltrates  CHEST - 2 VIEW  Comparison: 09/03/2012  Findings: Right-sided PICC line is again seen.  Bilateral infiltrates are again noted and stable.  Cardiac shadow is stable.  IMPRESSION: Stable bilateral infiltrative change   Original Report Authenticated By: Alcide Clever, M.D.    Dg Chest 2 View  09/03/2012  *RADIOLOGY REPORT*  Clinical Data: Previously seen infiltrate, check for resolution  CHEST - 2 VIEW  Comparison: 09/01/2012  Findings: The cardiac shadow is again enlarged.  Bilateral infiltrates are again seen and stable.  The right-sided PICC line is again noted.  No new focal abnormality is seen.  IMPRESSION: Stable bilateral infiltrates   Original Report Authenticated By: Alcide Clever, M.D.    Ct Head Wo Contrast  09/19/2012  *RADIOLOGY REPORT*  Clinical Data: Altered level of consciousness.  Atrial fibrillation.   Insulin dependent diabetes.  Hypertension.  CT HEAD WITHOUT CONTRAST  Technique:  Contiguous axial images were obtained from the base of the skull through the vertex without contrast.  Comparison: 08/25/2012.  Findings: Mild atrophy and chronic microvascular ischemic change. No acute cortical infarction or hemorrhage.  No mass lesion or hydrocephalus.  Right  thalamic and right putaminal lacunes.  Left middle cranial fossa arachnoid cyst. Vascular calcifications. Nasopharyngeal adenoidal hypertrophy or dependent nasopharyngeal fluid.  Calvarium intact.  No acute sinus or mastoid disease.  IMPRESSION: Chronic changes as described.  No acute intracranial findings. Stable appearance from priors.   Original Report Authenticated By: Davonna Belling, M.D.    Ct Head Wo Contrast  08/25/2012  *RADIOLOGY REPORT*  Clinical Data: Evaluate for bleed or infarct.  On Coumadin. Altered mental status.  History high blood pressure.  CT HEAD WITHOUT CONTRAST  Technique:  Contiguous axial images were obtained from the base of the skull through the vertex without contrast.  Comparison: 04/26/2007.  Findings: No intracranial hemorrhage.  Remote infarct right lenticular nucleus.  Small vessel disease type changes without CT evidence of large acute infarct.  Global atrophy without hydrocephalus.  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  No other intracranial mass lesion noted on this unenhanced exam.  Vascular calcifications.  Mastoid air cells, middle ear cavities and visualized paranasal sinuses are clear.  Mild exophthalmos.  IMPRESSION: No intracranial hemorrhage or CT evidence of large acute infarct. Please see above.   Original Report Authenticated By: Lacy Duverney, M.D.    US Renal  08/26/2012  *RADIOLOGY REPORT*  Clinical Data: Renal insufficiency. 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  RENAL/URINARY TRACT ULTRASOUND COMPLETE  Comparison:  CT scan dated 03/09/2012  Findings:  Right Kidney:   Removed.  Left Kidney:  14.0 cm in length.  1 cm cyst on the lateral aspect of the lower pole, unchanged.  No hydronephrosis or renal cortical thinning.  Bladder:  The bladder is empty with a Foley catheter in place.  IMPRESSION: Normal appearing left kidney.  Stable tiny cyst in the lower pole.   Original Report Authenticated By: Francene Boyers, M.D.    Dg Chest Port 1 View  09/20/2012  *RADIOLOGY REPORT*  Clinical Data: Ventilator dependent respiratory failure.  Follow up CHF.  PORTABLE CHEST - 1 VIEW 09/20/2012 0537 hours:  Comparison: Portable chest x-ray yesterday and 09/01/2012  Findings: Endotracheal tube tip in satisfactory position approximately 5 cm above carina.  Nasogastric tube courses below the diaphragm into the stomach.  Cardiac silhouette enlarged but stable.  Interval slight improvement in the interstitial and airspace pulmonary edema, though moderate edema persist.  New dense atelectasis in both lower lobes.  IMPRESSION: Support apparatus satisfactory.  Slight improvement in CHF, though moderate interstitial and airspace edema persists.  New dense bilateral lower lobe atelectasis.  Cough,   Original Report Authenticated By: Hulan Saas, M.D.    Dg Chest Portable 1 View  09/19/2012  *RADIOLOGY REPORT*  Clinical Data: Shortness of breath and post intubation.  PORTABLE CHEST - 1 VIEW  Comparison: 09/04/2012  Findings: Endotracheal tube is 6.3 cm above the carina. Nasogastric tube extends into the abdomen.  Again noted are diffuse interstitial densities and possibly airspace disease in the right lower lung.  There is a cardiac pad overlying the left upper chest. Stable appearance of the cardiomediastinal silhouette.  IMPRESSION: Support apparatuses as described.  Diffuse interstitial lung densities.  Findings could represent interstitial pulmonary edema but infection cannot be excluded.   Original Report Authenticated By: Richarda Overlie, M.D.    Dg Chest Port 1 View  09/01/2012  *RADIOLOGY  REPORT*  Clinical Data: PICC line placement.  PORTABLE CHEST - 1 VIEW  Comparison: Same date.  Findings: Interval placement of right-sided PICC line with distal tip in the expected position of the right atrium.  Stable mild cardiomegaly is noted.  Bilateral airspace opacities are noted concerning for pneumonia or edema.  Bony thorax is intact.  IMPRESSION: Continued presence of bilateral lung opacities concerning for pneumonia or edema.  Right-sided PICC line has been placed with distal tip in expected position of right atrium; it is recommended to be withdrawn at least 2 cm.   Original Report Authenticated By: Lupita Raider.,  M.D.    [facility] 1 View  09/01/2012  *RADIOLOGY REPORT*  Clinical Data: Pneumonia.  PORTABLE CHEST - 1 VIEW  Comparison: Chest x-ray 08/31/2012.  Findings: Lung volumes are low.  Severe multifocal interstitial and airspace disease is noted throughout all aspects of the lungs bilaterally, increased compared with yesterday's examination. There may be small bilateral pleural effusions, however, the costophrenic sulci are excluded from the lower margin of the image. Cardiomegaly.  Cephalization of the pulmonary vasculature. The patient is rotated to the right on today's exam, resulting in distortion of the mediastinal contours and reduced diagnostic sensitivity and specificity for mediastinal pathology. Atherosclerosis of the thoracic aorta.  IMPRESSION: 1.  Persistent multifocal bilateral interstitial and airspace disease.  Given the cardiomegaly and cephalization of the pulmonary vasculature, findings are favored to reflect severe pulmonary edema related to congestive heart failure, however, superimposed multilobar pneumonia is difficult to exclude.   Original Report Authenticated By: Trudie Reed, M.D.    Dg  Chest Port 1 View  08/31/2012  *RADIOLOGY REPORT*  Clinical Data: Pneumonia  PORTABLE CHEST - 1 VIEW  Comparison: 08/28/2012  Findings: Cardiac shadow remains mildly  enlarged.  Patchy bilateral infiltrative changes are again seen.  Given some technical variations of the films there likely is stable.  Abnormality is seen.  IMPRESSION: Stable bilateral infiltrates.   Original Report Authenticated By: Alcide Clever, M.D.    Dg Chest Port 1 View  08/28/2012  *RADIOLOGY REPORT*  Clinical Data: 72 year old male with respiratory failure.  PORTABLE CHEST - 1 VIEW  Comparison: 08/27/2012 and earlier.  Findings: Portable semi upright AP view 0645 hours.  Interval regression of multilobar are and bilateral (right greater than left) fluffy pulmonary opacity. Stable cardiomegaly and mediastinal contours.  No large effusion.  No pneumothorax.  No areas of worsening ventilation.  IMPRESSION: Interval regressed but not resolved multilobar bilateral pulmonary opacity.  I favor asymmetric pulmonary edema over pneumonia. Cardiomegaly.   Original Report Authenticated By: Erskine Speed, M.D.    Dg Chest Port 1 View  08/27/2012  *RADIOLOGY REPORT*  Clinical Data: To torus failure  PORTABLE CHEST - 1 VIEW  Comparison: Portable chest x-ray of 08/26/2012  Findings: There is more patchy opacities bilaterally.  Although this could represent asymmetrical edema, pneumonia is a definite consideration.  Cardiomegaly is stable.  No bony abnormality is seen.  IMPRESSION: Some worsening of patchy airspace disease.  Possible asymmetrical edema but consider pneumonia.   Original Report Authenticated By: Dwyane Dee, M.D.     Scheduled Meds: . amiodarone  400 mg Oral BID  . antiseptic oral rinse  15 mL Mouth Rinse QID  . cefTRIAXone (ROCEPHIN)  IV  1 g Intravenous Q24H  . chlorhexidine  15 mL Mouth Rinse BID  . diltiazem  60 mg Oral Q6H  . insulin aspart  0-20 Units Subcutaneous TID AC & HS  . metoprolol tartrate  25 mg Oral BID  . pantoprazole  40 mg Oral Daily  . pneumococcal 23 valent vaccine  0.5 mL Intramuscular Tomorrow-1000  . Warfarin - Pharmacist Dosing Inpatient   Does not apply q1800    Continuous Infusions: . sodium chloride 50 mL/hr at 09/23/12 0538  . diltiazem (CARDIZEM) infusion Stopped (09/22/12 1530)    Active Problems:   * No active hospital problems. *    Time spent: 40 minutes   [facility]  [facility] Pager [phone]. If 8PM-8AM, please contact night-coverage at [email or web address], password [email or web address] 09/23/2012, 9:29 AM  LOS: 4 days

## 2012-09-23 NOTE — Progress Notes (Signed)
Pt is now back in Sinus rhythm.

## 2012-09-23 NOTE — Progress Notes (Signed)
Patient [name] has been going in and out of  [name]  And  [name]. Fib. [name] Goes up w/ act. then back down. Cont. To monitor pt. And rhythm

## 2012-09-23 NOTE — Care Management Note (Signed)
    Page 1 of 2   10/06/2012     2:49:14 PM   CARE MANAGEMENT NOTE 10/06/2012  Patient:  Bobby Bobby, Bobby Bobby   Account Number:  [id number]  Date Initiated:  09/21/2012  Documentation initiated by:  [facility]  Subjective/Objective Assessment:   Found unresponsive.  Tx from [facility] ER - intubated for AMS.     Action/Plan:   Anticipated DC Date:  09/29/2012   Anticipated DC Plan:  HOME W HOME HEALTH SERVICES  In-house referral  Clinical Social Worker      DC Planning Services  CM consult      [facility] Choice  Resumption Of Svcs/PTA Provider   Choice offered to / List presented to:  C-1 Patient   DME arranged  [facility]      DME agency  [facility]     HH arranged  HH-1 RN  HH-2 PT  HH-3 OT  HH-4 NURSE'S AIDE      HH agency  [facility]   Status of service:  Completed, signed off Medicare Important Message given?   (If response is "NO", the following Medicare IM given date fields will be blank) Date Medicare IM given:   Date Additional Medicare IM given:    Discharge Disposition:  HOME W HOME HEALTH SERVICES  Per UR Regulation:  Reviewed for med. necessity/level of care/duration of stay  If discussed at Long Length of Stay Meetings, dates discussed:    Comments:  ContactMirian Capuchin [phone]  10/05/12 Aleyssa Pike,RN,BSN [phone] NOTIFIED [facility] OF DC HOME TODAY.  REFERRAL TO [facility] FOR DME NEEDS.  09/28/12 Hessie Varone,RN,BSN [phone] SPOKE WITH PT''S FRIEND WHO LIVES WITH HIM, Bobby Bobby [phone]):  MR Joseph CONFIRMS THAT HE CAN PROVIDE 24HR CARE FOR PT AND THAT HE DOES NOT WANT HIS FRIEND TO GO TO SNF.  WILL ARRANGE HOME CARE AS ORDERED.  NOTIFIED [facility] OF RESUMPTION OF CARE ORDERS.  ANTICIPATE DC 1-2 DAYS.  09/25/12 Skai Lickteig,RN,BSN [phone] PT CONFUSED, HALLUCINATING, HAS SITTER.  P.T. RECOMMENDING POSSIBLE SNF.  WILL CONSULT CSW TO FACILITATE DC TO POSSIBLE SNF WHEN MEDICALLY STABLE.  09/23/12 Aubrey Voong,RN,BSN  [phone] MD:  WILL NEED ORDERS FOR RESUMPTION OF HH SERVICES PRIOR TO DC.  PLEASE ENTER IN EPIC.  [facility]!  09-21-12 11:20am Avie Arenas, RNBSN - [phone] Talked with patient and friend in room.  States lives at home usually but friend that is in the room now stays with him and assists.  Since last admission has had [facility] RN and PT with [facility]. has Bobby Bobby at home and uses CPap at night. Both feel this arrangement has worked well.  Patient states the only thing he would like when he goes home is Bobby W/C if possible.

## 2012-09-23 NOTE — Progress Notes (Signed)
SUBJECTIVE:  Feels much better today.  Converted to normal sinus rhythm on IV amiodarone yesterday, but reverted back to atrial fib earlier today.  He had been on oral Cardizem.  This was increased and his beta blocker was increased.  He converted back to normal sinus rhythm.    OBJECTIVE:   Vitals:   Filed Vitals:   09/23/12 0415 09/23/12 1145 09/23/12 1244 09/23/12 1418  BP: 138/83 153/80  126/85  Pulse: 110 130  100  Temp: 97.8 F (36.6 C)   98 F (36.7 C)  TempSrc: Oral   Oral  Resp: 18   18  Height:      Weight: 101.4 kg (223 lb 8.7 oz)     SpO2: 99%  99% 98%   I&O's:    Intake/Output Summary (Last 24 hours) at 09/23/12 1624 Last data filed at 09/23/12 1244  Gross per 24 hour  Intake    720 ml  Output   2075 ml  Net  -1355 ml   TELEMETRY: Reviewed telemetry pt in normal sinus rhythm with bundle branch block:     PHYSICAL EXAM General: Well developed, well nourished, in no acute distress Head: Normal cephalic and atramatic  Lungs:   Clear bilaterally to auscultation and percussion. Heart:  HRRR S1 S2  Abdomen: Bowel sounds are positive, abdomen soft and non-tender Msk:  . Normal strength and tone for age. Extremities:   No  edema.   Neuro: Alert and oriented X 3. Psych:  Normal affect, responds appropriately   LABS: Basic Metabolic Panel:  Recent Labs  04/54/09 0345 09/23/12 0435  NA 139 139  K 3.6 4.7  CL 102 106  CO2 29 26  GLUCOSE 183* 147*  BUN 8 12  CREATININE 1.26 1.25  CALCIUM 8.4 8.7   Liver Function Tests: No results found for this basename: AST, ALT, ALKPHOS, BILITOT, PROT, ALBUMIN,  in the last 72 hours No results found for this basename: LIPASE, AMYLASE,  in the last 72 hours CBC:  Recent Labs  09/22/12 0345 09/23/12 0435  WBC 5.0 6.4  HGB 9.1* 9.2*  HCT 28.7* 29.4*  MCV 74.9* 75.6*  PLT 214 234   Cardiac Enzymes: No results found for this basename: CKTOTAL, CKMB, CKMBINDEX, TROPONINI,  in the last 72 hours BNP: No  components found with this basename: POCBNP,  D-Dimer: No results found for this basename: DDIMER,  in the last 72 hours Hemoglobin A1C: No results found for this basename: HGBA1C,  in the last 72 hours Fasting Lipid Panel: No results found for this basename: CHOL, HDL, LDLCALC, TRIG, CHOLHDL, LDLDIRECT,  in the last 72 hours Thyroid Function Tests: No results found for this basename: TSH, T4TOTAL, FREET3, T3FREE, THYROIDAB,  in the last 72 hours Anemia Panel: No results found for this basename: VITAMINB12, FOLATE, FERRITIN, TIBC, IRON, RETICCTPCT,  in the last 72 hours Coag Panel:   Lab Results  Component Value Date   INR 2.63* 09/23/2012   INR 2.67* 09/22/2012   INR 2.38* 09/21/2012    09/19/2012  *RADIOLOGY REPORT*  Clinical Data: Altered level of consciousness.  Atrial fibrillation.  Insulin dependent diabetes.  Hypertension.  CT HEAD WITHOUT CONTRAST  Technique:  Contiguous axial images were obtained from the base of the skull through the vertex without contrast.  Comparison: 08/25/2012.  Findings: Mild atrophy and chronic microvascular ischemic change. No acute cortical infarction or hemorrhage.  No mass lesion or hydrocephalus.  Right  thalamic and right putaminal lacunes.  Left middle cranial  fossa arachnoid cyst. Vascular calcifications. Nasopharyngeal adenoidal hypertrophy or dependent nasopharyngeal fluid.  Calvarium intact.  No acute sinus or mastoid disease.  IMPRESSION: Chronic changes as described.  No acute intracranial findings. Stable appearance from priors.   Original Report Authenticated By: Davonna Belling, M.D.    Ct Head Wo Contrast  08/25/2012  *RADIOLOGY REPORT*  Clinical Data: Evaluate for bleed or infarct.  On Coumadin. Altered mental status.  History high blood pressure.  CT HEAD WITHOUT CONTRAST  Technique:  Contiguous axial images were obtained from the base of the skull through the vertex without contrast.  Comparison: 04/26/2007.  Findings: No intracranial hemorrhage.   Remote infarct right lenticular nucleus.  Small vessel disease type changes without CT evidence of large acute infarct.  Global atrophy without hydrocephalus.  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  No other intracranial mass lesion noted on this unenhanced exam.  Vascular calcifications.  Mastoid air cells, middle ear cavities and visualized paranasal sinuses are clear.  Mild exophthalmos.  IMPRESSION: No intracranial hemorrhage or CT evidence of large acute infarct. Please see above.   Original Report Authenticated By: Lacy Duverney, M.D.    US Renal  08/26/2012  *RADIOLOGY REPORT*  Clinical Data: Renal insufficiency. 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  RENAL/URINARY TRACT ULTRASOUND COMPLETE  Comparison:  CT scan dated 03/09/2012  Findings:  Right Kidney:  Removed.  Left Kidney:  14.0 cm in length.  1 cm cyst on the lateral aspect of the lower pole, unchanged.  No hydronephrosis or renal cortical thinning.  Bladder:  The bladder is empty with a Foley catheter in place.  IMPRESSION: Normal appearing left kidney.  Stable tiny cyst in the lower pole.   Original Report Authenticated By: Francene Boyers, M.D.    Dg Chest Port 1 View  09/20/2012  *RADIOLOGY REPORT*  Clinical Data: Ventilator dependent respiratory failure.  Follow up CHF.  PORTABLE CHEST - 1 VIEW 09/20/2012 0537 hours:  Comparison: Portable chest x-ray yesterday and 09/01/2012  Findings: Endotracheal tube tip in satisfactory position approximately 5 cm above carina.  Nasogastric tube courses below the diaphragm into the stomach.  Cardiac silhouette enlarged but stable.  Interval slight improvement in the interstitial and airspace pulmonary edema, though moderate edema persist.  New dense atelectasis in both lower lobes.  IMPRESSION: Support apparatus satisfactory.  Slight improvement in CHF, though moderate interstitial and airspace edema persists.  New dense bilateral lower lobe atelectasis.  Cough,   Original  Report Authenticated By: Hulan Saas, M.D.    Dg Chest Portable 1 View  09/19/2012  *RADIOLOGY REPORT*  Clinical Data: Shortness of breath and post intubation.  PORTABLE CHEST - 1 VIEW  Comparison: 09/04/2012  Findings: Endotracheal tube is 6.3 cm above the carina. Nasogastric tube extends into the abdomen.  Again noted are diffuse interstitial densities and possibly airspace disease in the right lower lung.  There is a cardiac pad overlying the left upper chest. Stable appearance of the cardiomediastinal silhouette.  IMPRESSION: Support apparatuses as described.  Diffuse interstitial lung densities.  Findings could represent interstitial pulmonary edema but infection cannot be excluded.   Original Report Authenticated By: Richarda Overlie, M.D.    Dg Chest Port 1 View  09/01/2012  *RADIOLOGY REPORT*  Clinical Data: PICC line placement.  PORTABLE CHEST - 1 VIEW  Comparison: Same date.  Findings: Interval placement of right-sided PICC line with distal tip in the expected position of the right atrium.  Stable mild cardiomegaly is noted.  Bilateral airspace opacities  are noted concerning for pneumonia or edema.  Bony thorax is intact.  IMPRESSION: Continued presence of bilateral lung opacities concerning for pneumonia or edema.  Right-sided PICC line has been placed with distal tip in expected position of right atrium; it is recommended to be withdrawn at least 2 cm.   Original Report Authenticated By: Lupita Raider.,  M.D.    [facility] 1 View  09/01/2012  *RADIOLOGY REPORT*  Clinical Data: Pneumonia.  PORTABLE CHEST - 1 VIEW  Comparison: Chest x-ray 08/31/2012.  Findings: Lung volumes are low.  Severe multifocal interstitial and airspace disease is noted throughout all aspects of the lungs bilaterally, increased compared with yesterday's examination. There may be small bilateral pleural effusions, however, the costophrenic sulci are excluded from the lower margin of the image. Cardiomegaly.  Cephalization  of the pulmonary vasculature. The patient is rotated to the right on today's exam, resulting in distortion of the mediastinal contours and reduced diagnostic sensitivity and specificity for mediastinal pathology. Atherosclerosis of the thoracic aorta.  IMPRESSION: 1.  Persistent multifocal bilateral interstitial and airspace disease.  Given the cardiomegaly and cephalization of the pulmonary vasculature, findings are favored to reflect severe pulmonary edema related to congestive heart failure, however, superimposed multilobar pneumonia is difficult to exclude.   Original Report Authenticated By: Trudie Reed, M.D.    Dg Chest Port 1 View  08/31/2012  *RADIOLOGY REPORT*  Clinical Data: Pneumonia  PORTABLE CHEST - 1 VIEW  Comparison: 08/28/2012  Findings: Cardiac shadow remains mildly enlarged.  Patchy bilateral infiltrative changes are again seen.  Given some technical variations of the films there likely is stable.  Abnormality is seen.  IMPRESSION: Stable bilateral infiltrates.   Original Report Authenticated By: Alcide Clever, M.D.    Dg Chest Port 1 View  08/28/2012  *RADIOLOGY REPORT*  Clinical Data: 72 year old male with respiratory failure.  PORTABLE CHEST - 1 VIEW  Comparison: 08/27/2012 and earlier.  Findings: Portable semi upright AP view 0645 hours.  Interval regression of multilobar are and bilateral (right greater than left) fluffy pulmonary opacity. Stable cardiomegaly and mediastinal contours.  No large effusion.  No pneumothorax.  No areas of worsening ventilation.  IMPRESSION: Interval regressed but not resolved multilobar bilateral pulmonary opacity.  I favor asymmetric pulmonary edema over pneumonia. Cardiomegaly.   Original Report Authenticated By: Erskine Speed, M.D.    Dg Chest Port 1 View  08/27/2012  *RADIOLOGY REPORT*  Clinical Data: To torus failure  PORTABLE CHEST - 1 VIEW  Comparison: Portable chest x-ray of 08/26/2012  Findings: There is more patchy opacities bilaterally.   Although this could represent asymmetrical edema, pneumonia is a definite consideration.  Cardiomegaly is stable.  No bony abnormality is seen.  IMPRESSION: Some worsening of patchy airspace disease.  Possible asymmetrical edema but consider pneumonia.   Original Report Authenticated By: Dwyane Dee, M.D.       ASSESSMENT: Atrial fibrillation, coronary artery disease  PLAN:  Atrial fibrillation: I last saw the patient in the office in February 2013.  At that time he was on atenolol and Cardizem and maintaining sinus rhythm.  He gives a convoluted story about all of his medicines being changed by his doctor.  He had a friend who picked up these medicines.  He is concerned that the friend tried to overdose him.  It's very unclear to me what he was actually taking prior to coming into the hospital.  Regardless, it appears he will need an antiarrhythmic to maintain sinus rhythm.  He  definitely feels when he goes out of rhythm.  He states this has happened many times over the past year.  Continue amiodarone load.  At the time of discharge, would send home on amiodarone 400 mg daily.  He will likely need Cardizem CD 360 mg daily.  He'll need metoprolol 50 mg by mouth twice a day.  Anticoagulation: When I saw him in February of 2013, he was on aspirin and Plavix due to a drug-eluting stent that was placed a few years ago.  We did not put him on Coumadin.  Dr. Ricki Miller note from 2 days ago states that he has been on Coumadin.  We have not been following his INR.  His primary care doctor may have been doing this recently.  Coumadin is certainly reasonable for his risk of stroke.  I am also concerned that without proper monitoring, he could have a bleeding complication.  If good followup cannot be arranged, a novel oral anticoagulant such as Xarelto or Eliquis could be used if he is able to obtain this medication.  Will likely need social work help to figure out his home situation.  I am okay with him using warfarin.   Would discontinue Plavix at the time of discharge.  The importance of close monitoring of his INR is very important.  It would likely be easier for him have Coumadin monitored with his primary care doctor since he is not living [place].  Today, oral Cardizem was increased to 90 mg every 6 hours.  Metoprolol was increased to 50 mg twice a day.  He converted back to normal sinus rhythm.    He previously was on atenolol.  He brought this medication to the hospital to show [place]. This apparently has been switched to metoprolol as an outpatient.  Either medication would be fine.   Of note, I reviewed the medications that he brought to the hospital with him.  He states that he has other medicines that his primary care doctor prescribed at home.  Corky Crafts., MD  09/23/2012  4:24 PM

## 2012-09-24 ENCOUNTER — Inpatient Hospital Stay (HOSPITAL_COMMUNITY): Payer: Medicare Other

## 2012-09-24 LAB — CBC
HCT: 29.1 % — ABNORMAL LOW (ref 39.0–52.0)
Hemoglobin: 9.2 g/dL — ABNORMAL LOW (ref 13.0–17.0)
MCH: 23.6 pg — ABNORMAL LOW (ref 26.0–34.0)
MCV: 74.6 fL — ABNORMAL LOW (ref 78.0–100.0)
Platelets: 243 10*3/uL (ref 150–400)
RBC: 3.9 MIL/uL — ABNORMAL LOW (ref 4.22–5.81)
WBC: 7.1 10*3/uL (ref 4.0–10.5)

## 2012-09-24 LAB — GLUCOSE, CAPILLARY
Glucose-Capillary: 139 mg/dL — ABNORMAL HIGH (ref 70–99)
Glucose-Capillary: 123 mg/dL — ABNORMAL HIGH (ref 70–99)
Glucose-Capillary: 126 mg/dL — ABNORMAL HIGH (ref 70–99)

## 2012-09-24 LAB — CULTURE, BLOOD (ROUTINE X 2): Culture: NO GROWTH

## 2012-09-24 LAB — BASIC METABOLIC PANEL
BUN: 15 mg/dL (ref 6–23)
CO2: 25 mEq/L (ref 19–32)
Calcium: 8.8 mg/dL (ref 8.4–10.5)
Chloride: 104 mEq/L (ref 96–112)
Creatinine, Ser: 1.17 mg/dL (ref 0.50–1.35)
Glucose, Bld: 163 mg/dL — ABNORMAL HIGH (ref 70–99)

## 2012-09-24 MED ORDER — CYANOCOBALAMIN 1000 MCG/ML IJ SOLN
1000.0000 ug | Freq: Once | INTRAMUSCULAR | Status: AC
  Administered 2012-09-24: 1000 ug via INTRAMUSCULAR
  Filled 2012-09-24: qty 1

## 2012-09-24 MED ORDER — QUETIAPINE FUMARATE 25 MG PO TABS
25.0000 mg | ORAL_TABLET | Freq: Every day | ORAL | Status: DC
  Administered 2012-09-24: 25 mg via ORAL
  Filled 2012-09-24 (×2): qty 1

## 2012-09-24 NOTE — Progress Notes (Signed)
Patient is experiencing hallucinations that he is riding in an ambulance to the hospital.  He states that he sees cars driving by and mentions bushes that he sees outside.  He occasionally sees people who are not present.  I oriented the patient to his surroundings, but he stated that I am trying to trick him.  I notified the physician on-call for [facility] to make them aware of the situation.  Will continue to monitor and re-orient patient.  Arva Chafe

## 2012-09-24 NOTE — Progress Notes (Signed)
SUBJECTIVE:  Confused.  Says someone called him to meet him at [facility].  Maintaining normal sinus rhythm on amiodarone, Cardizem, metoprolol.  He assures me he will stay in the bed.    OBJECTIVE:   Vitals:   Filed Vitals:   09/23/12 1418 09/23/12 2011 09/24/12 0308 09/24/12 0525  BP: 126/85 152/75  140/71  Pulse: 100 81  78  Temp: 98 F (36.7 C) 98.5 F (36.9 C)  98.6 F (37 C)  TempSrc: Oral Oral  Oral  Resp: 18 18  18   Height:      Weight:   101.1 kg (222 lb 14.2 oz)   SpO2: 98% 93%  93%   I&O's:    Intake/Output Summary (Last 24 hours) at 09/24/12 0845 Last data filed at 09/24/12 0810  Gross per 24 hour  Intake   2360 ml  Output   1500 ml  Net    860 ml   TELEMETRY: Reviewed telemetry pt in normal sinus rhythm with bundle branch block:     PHYSICAL EXAM General: Well developed, well nourished, sweating Head: Normal cephalic and atramatic  Lungs:   Clear bilaterally to auscultation and percussion. Heart:  HRRR S1 S2  Abdomen: Bowel sounds are positive, abdomen soft and non-tender Msk:  . Normal strength and tone for age. Extremities:   No  edema.   Neuro: Alert and oriented X 3. Psych:  Normal affect, responds appropriately   LABS: Basic Metabolic Panel:  Recent Labs  16/10/96 0435 09/24/12 0435  NA 139 138  K 4.7 4.2  CL 106 104  CO2 26 25  GLUCOSE 147* 163*  BUN 12 15  CREATININE 1.25 1.17  CALCIUM 8.7 8.8   Liver Function Tests: No results found for this basename: AST, ALT, ALKPHOS, BILITOT, PROT, ALBUMIN,  in the last 72 hours No results found for this basename: LIPASE, AMYLASE,  in the last 72 hours CBC:  Recent Labs  09/23/12 0435 09/24/12 0435  WBC 6.4 7.1  HGB 9.2* 9.2*  HCT 29.4* 29.1*  MCV 75.6* 74.6*  PLT 234 243   Cardiac Enzymes: No results found for this basename: CKTOTAL, CKMB, CKMBINDEX, TROPONINI,  in the last 72 hours BNP: No components found with this basename: POCBNP,  D-Dimer: No results found for this basename:  DDIMER,  in the last 72 hours Hemoglobin A1C: No results found for this basename: HGBA1C,  in the last 72 hours Fasting Lipid Panel: No results found for this basename: CHOL, HDL, LDLCALC, TRIG, CHOLHDL, LDLDIRECT,  in the last 72 hours Thyroid Function Tests: No results found for this basename: TSH, T4TOTAL, FREET3, T3FREE, THYROIDAB,  in the last 72 hours Anemia Panel: No results found for this basename: VITAMINB12, FOLATE, FERRITIN, TIBC, IRON, RETICCTPCT,  in the last 72 hours Coag Panel:   Lab Results  Component Value Date   INR 2.67* 09/24/2012   INR 2.63* 09/23/2012   INR 2.67* 09/22/2012    09/19/2012  *RADIOLOGY REPORT*  Clinical Data: Altered level of consciousness.  Atrial fibrillation.  Insulin dependent diabetes.  Hypertension.  CT HEAD WITHOUT CONTRAST  Technique:  Contiguous axial images were obtained from the base of the skull through the vertex without contrast.  Comparison: 08/25/2012.  Findings: Mild atrophy and chronic microvascular ischemic change. No acute cortical infarction or hemorrhage.  No mass lesion or hydrocephalus.  Right  thalamic and right putaminal lacunes.  Left middle cranial fossa arachnoid cyst. Vascular calcifications. Nasopharyngeal adenoidal hypertrophy or dependent nasopharyngeal fluid.  Calvarium intact.  No acute sinus or mastoid disease.  IMPRESSION: Chronic changes as described.  No acute intracranial findings. Stable appearance from priors.   Original Report Authenticated By: Davonna Belling, M.D.    Ct Head Wo Contrast  08/25/2012  *RADIOLOGY REPORT*  Clinical Data: Evaluate for bleed or infarct.  On Coumadin. Altered mental status.  History high blood pressure.  CT HEAD WITHOUT CONTRAST  Technique:  Contiguous axial images were obtained from the base of the skull through the vertex without contrast.  Comparison: 04/26/2007.  Findings: No intracranial hemorrhage.  Remote infarct right lenticular nucleus.  Small vessel disease type changes without CT  evidence of large acute infarct.  Global atrophy without hydrocephalus.  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  No other intracranial mass lesion noted on this unenhanced exam.  Vascular calcifications.  Mastoid air cells, middle ear cavities and visualized paranasal sinuses are clear.  Mild exophthalmos.  IMPRESSION: No intracranial hemorrhage or CT evidence of large acute infarct. Please see above.   Original Report Authenticated By: Lacy Duverney, M.D.    US Renal  08/26/2012  *RADIOLOGY REPORT*  Clinical Data: Renal insufficiency. 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  RENAL/URINARY TRACT ULTRASOUND COMPLETE  Comparison:  CT scan dated 03/09/2012  Findings:  Right Kidney:  Removed.  Left Kidney:  14.0 cm in length.  1 cm cyst on the lateral aspect of the lower pole, unchanged.  No hydronephrosis or renal cortical thinning.  Bladder:  The bladder is empty with a Foley catheter in place.  IMPRESSION: Normal appearing left kidney.  Stable tiny cyst in the lower pole.   Original Report Authenticated By: Francene Boyers, M.D.    Dg Chest Port 1 View  09/20/2012  *RADIOLOGY REPORT*  Clinical Data: Ventilator dependent respiratory failure.  Follow up CHF.  PORTABLE CHEST - 1 VIEW 09/20/2012 0537 hours:  Comparison: Portable chest x-ray yesterday and 09/01/2012  Findings: Endotracheal tube tip in satisfactory position approximately 5 cm above carina.  Nasogastric tube courses below the diaphragm into the stomach.  Cardiac silhouette enlarged but stable.  Interval slight improvement in the interstitial and airspace pulmonary edema, though moderate edema persist.  New dense atelectasis in both lower lobes.  IMPRESSION: Support apparatus satisfactory.  Slight improvement in CHF, though moderate interstitial and airspace edema persists.  New dense bilateral lower lobe atelectasis.  Cough,   Original Report Authenticated By: Hulan Saas, M.D.    Dg Chest Portable 1  View  09/19/2012  *RADIOLOGY REPORT*  Clinical Data: Shortness of breath and post intubation.  PORTABLE CHEST - 1 VIEW  Comparison: 09/04/2012  Findings: Endotracheal tube is 6.3 cm above the carina. Nasogastric tube extends into the abdomen.  Again noted are diffuse interstitial densities and possibly airspace disease in the right lower lung.  There is a cardiac pad overlying the left upper chest. Stable appearance of the cardiomediastinal silhouette.  IMPRESSION: Support apparatuses as described.  Diffuse interstitial lung densities.  Findings could represent interstitial pulmonary edema but infection cannot be excluded.   Original Report Authenticated By: Richarda Overlie, M.D.    Dg Chest Port 1 View  09/01/2012  *RADIOLOGY REPORT*  Clinical Data: PICC line placement.  PORTABLE CHEST - 1 VIEW  Comparison: Same date.  Findings: Interval placement of right-sided PICC line with distal tip in the expected position of the right atrium.  Stable mild cardiomegaly is noted.  Bilateral airspace opacities are noted concerning for pneumonia or edema.  Bony thorax is intact.  IMPRESSION: Continued presence  of bilateral lung opacities concerning for pneumonia or edema.  Right-sided PICC line has been placed with distal tip in expected position of right atrium; it is recommended to be withdrawn at least 2 cm.   Original Report Authenticated By: Lupita Raider.,  M.D.    [facility] 1 View  09/01/2012  *RADIOLOGY REPORT*  Clinical Data: Pneumonia.  PORTABLE CHEST - 1 VIEW  Comparison: Chest x-ray 08/31/2012.  Findings: Lung volumes are low.  Severe multifocal interstitial and airspace disease is noted throughout all aspects of the lungs bilaterally, increased compared with yesterday's examination. There may be small bilateral pleural effusions, however, the costophrenic sulci are excluded from the lower margin of the image. Cardiomegaly.  Cephalization of the pulmonary vasculature. The patient is rotated to the right on  today's exam, resulting in distortion of the mediastinal contours and reduced diagnostic sensitivity and specificity for mediastinal pathology. Atherosclerosis of the thoracic aorta.  IMPRESSION: 1.  Persistent multifocal bilateral interstitial and airspace disease.  Given the cardiomegaly and cephalization of the pulmonary vasculature, findings are favored to reflect severe pulmonary edema related to congestive heart failure, however, superimposed multilobar pneumonia is difficult to exclude.   Original Report Authenticated By: Trudie Reed, M.D.    Dg Chest Port 1 View  08/31/2012  *RADIOLOGY REPORT*  Clinical Data: Pneumonia  PORTABLE CHEST - 1 VIEW  Comparison: 08/28/2012  Findings: Cardiac shadow remains mildly enlarged.  Patchy bilateral infiltrative changes are again seen.  Given some technical variations of the films there likely is stable.  Abnormality is seen.  IMPRESSION: Stable bilateral infiltrates.   Original Report Authenticated By: Alcide Clever, M.D.    Dg Chest Port 1 View  08/28/2012  *RADIOLOGY REPORT*  Clinical Data: 72 year old male with respiratory failure.  PORTABLE CHEST - 1 VIEW  Comparison: 08/27/2012 and earlier.  Findings: Portable semi upright AP view 0645 hours.  Interval regression of multilobar are and bilateral (right greater than left) fluffy pulmonary opacity. Stable cardiomegaly and mediastinal contours.  No large effusion.  No pneumothorax.  No areas of worsening ventilation.  IMPRESSION: Interval regressed but not resolved multilobar bilateral pulmonary opacity.  I favor asymmetric pulmonary edema over pneumonia. Cardiomegaly.   Original Report Authenticated By: Erskine Speed, M.D.    Dg Chest Port 1 View  08/27/2012  *RADIOLOGY REPORT*  Clinical Data: To torus failure  PORTABLE CHEST - 1 VIEW  Comparison: Portable chest x-ray of 08/26/2012  Findings: There is more patchy opacities bilaterally.  Although this could represent asymmetrical edema, pneumonia is a definite  consideration.  Cardiomegaly is stable.  No bony abnormality is seen.  IMPRESSION: Some worsening of patchy airspace disease.  Possible asymmetrical edema but consider pneumonia.   Original Report Authenticated By: Dwyane Dee, M.D.       ASSESSMENT: Atrial fibrillation, coronary artery disease  PLAN:  Atrial fibrillation: I last saw the patient in the office in February 2013.  At that time he was on atenolol and Cardizem and maintaining sinus rhythm.  He gives a convoluted story about all of his medicines being changed by his doctor.  He had a friend who picked up these medicines.  He is concerned that the friend tried to overdose him.  It's very unclear to me what he was actually taking prior to coming into the hospital.  Regardless, it appears he will need an antiarrhythmic to maintain sinus rhythm.  He definitely feels when he goes out of rhythm.  He states this has happened many times  over the past year.  Continue amiodarone load.  At the time of discharge, would send home on amiodarone 400 mg daily.  He will likely need Cardizem CD 360 mg daily.  He'll need metoprolol 50 mg by mouth twice a day.  Anticoagulation: When I saw him in February of 2013, he was on aspirin and Plavix due to a drug-eluting stent that was placed a few years ago.  We did not put him on Coumadin.  Dr. Ricki Miller note from 2 days ago states that he has been on Coumadin.  We have not been following his INR.  His primary care doctor may have been doing this recently.  Coumadin is certainly reasonable for his risk of stroke.  I am also concerned that without proper monitoring, he could have a bleeding complication.  If good followup cannot be arranged, a novel oral anticoagulant such as Xarelto or Eliquis could be used if he is able to obtain this medication.  Will likely need social work help to figure out his home situation.  I am okay with him using warfarin if his mental status improves.  Would discontinue Plavix at the time of  discharge.  The importance of close monitoring of his INR is very important.  It would likely be easier for him have Coumadin monitored with his primary care doctor since he is not living [facility].   He previously was on atenolol.  He brought this medication to the hospital to show [place]. This apparently has been switched to metoprolol as an outpatient.  Either medication would be fine.   Of note, I reviewed the medications that he brought to the hospital with him.  He states that he has other medicines that his primary care doctor prescribed at home.  His confusion is definitely different from baseline.  The concerns from the first part of the plan may indicate some chronic paranoia.  His mental status will have to be cleared up before I would send him home on Coumadin.  ? Psych consult?   Corky Crafts., MD  09/24/2012  8:45 AM

## 2012-09-24 NOTE — Progress Notes (Signed)
ANTICOAGULATION CONSULT NOTE - Follow Up Consult  Pharmacy Consult for Coumadin Indication: atrial fibrillation  No Known Allergies  Patient Measurements: Height: 5\' 7"  (170.2 cm) Weight: 222 lb 14.2 oz (101.1 kg) IBW/kg (Calculated) : 66.1  Vital Signs: Temp: 98.6 F (37 C) (04/17 0525) Temp src: Oral (04/17 0525) BP: 140/71 mmHg (04/17 0525) Pulse Rate: 78 (04/17 0525)  Labs:  Recent Labs  09/22/12 0345 09/23/12 0435 09/24/12 0435  HGB 9.1* 9.2* 9.2*  HCT 28.7* 29.4* 29.1*  PLT 214 234 243  LABPROT 27.1* 26.8* 27.1*  INR 2.67* 2.63* 2.67*  CREATININE 1.26 1.25 1.17    Estimated Creatinine Clearance: 67.5 ml/min (by C-G formula based on Cr of 1.17).   Medications:  Scheduled:  . amiodarone  400 mg Oral BID  . antiseptic oral rinse  15 mL Mouth Rinse QID  . [COMPLETED] cefTRIAXone (ROCEPHIN)  IV  1 g Intravenous Q24H  . chlorhexidine  15 mL Mouth Rinse BID  . [COMPLETED] diltiazem  30 mg Oral Once  . diltiazem  90 mg Oral Q6H  . insulin aspart  0-20 Units Subcutaneous TID AC & HS  . metoprolol tartrate  50 mg Oral BID  . pantoprazole  40 mg Oral Daily  . warfarin  5 mg Oral q1800  . Warfarin - Pharmacist Dosing Inpatient   Does not apply q1800  . [DISCONTINUED] diltiazem  60 mg Oral Q6H  . [DISCONTINUED] metoprolol tartrate  25 mg Oral BID    Assessment: [name] on Coumadin PTA for afib.  Presented with INR>6 (no bleeding) and received 5mg  Vit K.  INR now therapeutic and Coumadin restarted 4/14.  Home Coumadin dose of 7.5 mg daily was too high based on presenting INR and new addition of Amiodarone can also elevate INR.  Will attempt Coumadin 5mg  daily as maintenance regimen and continue to monitor daily INR while in hospital.  Noted increased confusion and concern for discharge home on anticoagulation.  Will continue to follow with you until a decision is made.  Goal of Therapy:  INR 2-3   Plan:  Coumadin 5mg  PO daily. Daily INR.  [employer],  Pharm.D., BCPS Clinical Pharmacist Pager [phone] 09/24/2012 10:41 AM

## 2012-09-24 NOTE — Progress Notes (Addendum)
RN responded to bed alarm, Pt very fast out of bed, unsteady, out of breath.  States "somebody called" him and told him "needs to be someplace at 3:00."  RN assisted back to bed, placed O2 and bed alarm on, attempted to reorient.   Pt then insisted that RN "check out the bathroom" stating "he's dug a whole in there and I don't know what it is but it might be poison."  MD Eldridge Dace rounding immediately after this interaction, made aware.

## 2012-09-24 NOTE — Progress Notes (Signed)
Pt found on floor. MD, [facility], family and Department director notified. VSS stable and CBG was 128. MD ordered a CT scan of head and also requested a sitter. Pt is alert but confused , can move all extremities, no apparent injuries visible.No other orders given. Will continue to monitor.    [name] M

## 2012-09-24 NOTE — Progress Notes (Signed)
Occupational Therapy Evaluation Patient Details Name: [name] MRN: [id number] DOB: Nov 29, 1940 Today's Date: 09/24/2012 Time: 8295-6213 OT Time Calculation (min): 31 min  OT Assessment / Plan / Recommendation Clinical Impression  pt adm with AMS.  Pt recently adm with PNA and arib with RVR., Now afib recently converted and pt suffering from Resp distress during to pulmonary effusions.  On eval, pt is confused/hallucinating, weak with little tolerance for activity and has decr balance/gait instability.  Pt can benefit from OT to maximize ADL I and safety    OT Assessment  Patient needs continued OT Services    Follow Up Recommendations  SNF;Home health OT    Barriers to Discharge Decreased caregiver support Pt has roommate but unclear how much assistance the roommate is able to provide  Equipment Recommendations  Other (comment) (TBD)    Frequency  Min 2X/week    Precautions / Restrictions Precautions Precautions: Fall Restrictions Weight Bearing Restrictions: No    ADL  Eating/Feeding: Set up Where Assessed - Eating/Feeding: Chair Grooming: Performed;Wash/dry hands;Wash/dry face;Brushing hair;Moderate assistance Where Assessed - Grooming: Unsupported sitting;Unsupported standing Upper Body Bathing: Minimal assistance Where Assessed - Upper Body Bathing: Unsupported sitting Lower Body Bathing: Performed;Minimal assistance Where Assessed - Lower Body Bathing: Unsupported sitting Upper Body Dressing: Minimal assistance Where Assessed - Upper Body Dressing: Unsupported sitting Lower Body Dressing: Minimal assistance Where Assessed - Lower Body Dressing: Unsupported sit to stand Toilet Transfer: Minimal assistance Toilet Transfer Method: Sit to stand Toileting - Clothing Manipulation and Hygiene: Minimal assistance Equipment Used: Other (comment) (O2 at 2L via [place]) Transfers/Ambulation Related to ADLs: Patient performed bed to chair transfer with min A overall. ADL  Comments: Patient limited by decreased balance, decreased endurance.    OT Diagnosis: Generalized weakness;Altered mental status  OT Problem List: Decreased strength;Decreased activity tolerance;Decreased cognition;Decreased safety awareness OT Treatment Interventions: Self-care/ADL training;Energy conservation;DME and/or AE instruction;Therapeutic activities;Patient/family education;Cognitive remediation/compensation   OT Goals Acute Rehab OT Goals OT Goal Formulation: With patient Time For Goal Achievement: 10/08/12 Potential to Achieve Goals: Good ADL Goals Pt Will Perform Grooming: with modified independence;Standing at sink ADL Goal: Grooming - Progress: Goal set today Pt Will Perform Upper Body Bathing: with modified independence;Sit to stand from chair ADL Goal: Upper Body Bathing - Progress: Goal set today Pt Will Perform Lower Body Bathing: with modified independence;Sit to stand from chair ADL Goal: Lower Body Bathing - Progress: Goal set today Pt Will Perform Upper Body Dressing: with modified independence;Sitting, chair ADL Goal: Upper Body Dressing - Progress: Goal set today Pt Will Perform Lower Body Dressing: with modified independence;Sit to stand from chair ADL Goal: Lower Body Dressing - Progress: Goal set today Pt Will Transfer to Toilet: with modified independence ADL Goal: Toilet Transfer - Progress: Goal set today Pt Will Perform Toileting - Clothing Manipulation: with modified independence ADL Goal: Toileting - Clothing Manipulation - Progress: Goal set today Pt Will Perform Toileting - Hygiene: with modified independence  Visit Information  Last OT Received On: 09/24/12 Assistance Needed: +1 PT/OT Co-Evaluation/Treatment: Yes    Subjective Data  Subjective: Pt hallucinating, thinks his roommate brought the hospital bed here Patient Stated Goal: none stated   Prior Functioning     Home Living Lives With: Friend(s) Available Help at Discharge:  Friend(s);Available 24 hours/day Type of Home: House Home Access: Stairs to enter [employer] of Steps: 4 Entrance Stairs-Rails: Right;Left;Can reach both Home Layout: One level Bathroom Shower/Tub: Engineer, manufacturing systems: Standard Home Adaptive Equipment: Shower chair with back;Walker -  rolling Prior Function Level of Independence: Independent Able to Take Stairs?: Yes Driving: Yes Vocation: Full time employment Communication Communication: No difficulties         Vision/Perception Vision - History Baseline Vision: No visual deficits Patient Visual Report: No change from baseline   Cognition  Cognition Arousal/Alertness: Awake/alert Behavior During Therapy: Impulsive Overall Cognitive Status: Impaired/Different from baseline Area of Impairment: Orientation;Safety/judgement Orientation Level: Disoriented to;Place;Situation Safety/Judgement: Decreased awareness of safety General Comments: Patient hallucinating throughout session. Reports black cat in window, a tall visitor whose head goes through the roof, and trying to drive his hospital bed like a car.     Extremity/Trunk Assessment Right Upper Extremity Assessment RUE ROM/Strength/Tone: Within functional levels (shoulder ROM limitations due to old injury) RUE Coordination: WFL - gross/fine motor (mild tremor BUE) Left Upper Extremity Assessment LUE ROM/Strength/Tone: Within functional levels LUE Coordination: WFL - gross/fine motor (mild tremor BUE) Right Lower Extremity Assessment RLE ROM/Strength/Tone: WFL for tasks assessed;Deficits RLE ROM/Strength/Tone Deficits: bil grossly weak at 4/5 RLE Sensation: History of peripheral neuropathy Left Lower Extremity Assessment LLE ROM/Strength/Tone: [facility] for tasks assessed;Deficits LLE ROM/Strength/Tone Deficits: see RLE LLE Sensation: History of peripheral neuropathy Trunk Assessment Trunk Assessment: Normal     Mobility Bed Mobility Bed Mobility:  Supine to Sit;Sitting - Scoot to Edge of Bed Supine to Sit: 4: Min guard;With rails;HOB flat Sitting - Scoot to Edge of Bed: 4: Min guard Sit to Supine: 4: Min guard;HOB elevated Details for Bed Mobility Assistance: Needed use of rails for safety, no assist needed Transfers Transfers: Sit to Stand;Stand to Sit Sit to Stand: 4: Min guard Stand to Sit: 4: Min guard Details for Transfer Assistance: min guard for safety     Balance Balance Balance Assessed: Yes Static Standing Balance Static Standing - Balance Support: During functional activity;Left upper extremity supported;Right upper extremity supported Static Standing - Level of Assistance: 4: Min assist Static Standing - Comment/# of Minutes: 3-4 minutes Dynamic Standing Balance Dynamic Standing - Balance Support: During functional activity;Left upper extremity supported;Right upper extremity supported Dynamic Standing - Level of Assistance: 4: Min assist   End of Session OT - End of Session Equipment Utilized During Treatment: Gait belt (2L O2 via [place]) Activity Tolerance: Patient tolerated treatment well;Patient limited by fatigue Patient left: in chair;with chair alarm set;with nursing in room;with call bell/phone within reach Nurse Communication: Mobility status  GO     Cerissa Zeiger A 09/24/2012, 5:54 PM

## 2012-09-24 NOTE — Progress Notes (Signed)
Pt with severe worsening hallucinations that he is in a moving vehicle out of control.  MD Abrol notified, verbal order for 25mg  of seroquel given.  Will report thoroughly to night RN.

## 2012-09-24 NOTE — Progress Notes (Signed)
[facility] PROGRESS NOTE  FLORA RATZ ZOX:[id number] DOB: May 16, 1941 DOA: 09/19/2012 PCP: No primary provider on file.  Assessment/Plan:  BRIEF PATIENT DESCRIPTION:  72 yo male brought to [facility] ED with altered mental status. Intubated for airway protection. Transferred to [facility] for further Tx under PCCM service. Had recent admit for aspiration PNA, A fib with RVR. 4/12 Transfer to [facility]. Went into another episode A fib with RVR on 4/14.  STUDIES:  CT chest 4/12 >> negative for acute bleed  LINES / TUBES:  ETT 4/12 >> 4/13  CULTURES:  Blood 4/12 >> Pending  Urine 4/12 >> No growth  Sputum 4/12 >> Pending  ANTIBIOTICS:  Vancomycin 4/12 >>4/14  Zosyn 4/12 >>4/12  Ceftriaxone 4/14>>>plan stop 4/16    Atrial fibrillation   Continue amiodarone load. At the time of discharge, would send home on amiodarone 400 mg daily. He will likely need Cardizem CD 360 mg daily. He'll need metoprolol 50 mg by mouth twice a day. Continue Coumadin, INR to be followed by PCP   Acute respiratory failure 2nd to pulmonary edema +/- PNA. Improving.  Hx of OSA.  Extubated, stable oxygen requirements    Creatinine improved 1.48 > 1.27.  S/p nephrectomy.  Creatinine is stable    Hx of Hepatis. Normal liver enzymes  Anemia of chronic disease. Hgb 8.3. INR improving 6.73 >>>3.1 >2.8.  Coagulopathy 2nd to coumadin for A fib.  Monitor INR, may need to be switched to xarelto , if affordable    Recent therapy for aspiration PNA with persistent infiltrate on CXR. Respiratory and blood cultures - Pending  D/ced Vancomycin, zosyn  Continue with ceftriaxone, stop date 4/17,   Diabetes mellitus type 2, continue SSI   Acute encephalopathy - resolved. ? Cause >> may be from opiates, HTN encephalopathy, hypoxia. CT head negative for acute findings , given  Persistent confusion, we'll order an MRI of the brain, if negative will consider psychiatric consultation, will check a vitamin B 12 and a TSH       Code Status: full  Family Communication: family updated about patient's clinical progress  Disposition Plan: As above  Consultants:  Cardiology  Critical care   Subjective-tangential speech and confused  Objective: Filed Vitals:   09/23/12 1418 09/23/12 2011 09/24/12 0308 09/24/12 0525  BP: 126/85 152/75  140/71  Pulse: 100 81  78  Temp: 98 F (36.7 C) 98.5 F (36.9 C)  98.6 F (37 C)  TempSrc: Oral Oral  Oral  Resp: 18 18  18   Height:      Weight:   101.1 kg (222 lb 14.2 oz)   SpO2: 98% 93%  93%    Intake/Output Summary (Last 24 hours) at 09/24/12 1207 Last data filed at 09/24/12 0810  Gross per 24 hour  Intake   2360 ml  Output   1200 ml  Net   1160 ml    Exam:  HENT:  Head: Atraumatic.  Nose: Nose normal.  Mouth/Throat: Oropharynx is clear and moist.  Eyes: Conjunctivae are normal. Pupils are equal, round, and reactive to light. No scleral icterus.  Neck: Neck supple. No tracheal deviation present.  Cardiovascular: Normal rate, regular rhythm, normal heart sounds and intact distal pulses.  Pulmonary/Chest: Effort normal and breath sounds normal. No respiratory distress.  Abdominal: Soft. Normal appearance and bowel sounds are normal. She exhibits no distension. There is no tenderness.  Musculoskeletal: She exhibits no edema and no tenderness.  Neurological: She is alert. No cranial nerve deficit.  Data Reviewed: Basic Metabolic Panel:  Recent Labs Lab 09/20/12 0527 09/21/12 0450 09/22/12 0345 09/23/12 0435 09/24/12 0435  NA 143 140 139 139 138  K 4.0 4.1 3.6 4.7 4.2  CL 102 101 102 106 104  CO2 34* 32 29 26 25   GLUCOSE 66* 145* 183* 147* 163*  BUN 13 12 8 12 15   CREATININE 1.30 1.48* 1.26 1.25 1.17  CALCIUM 8.7 8.4 8.4 8.7 8.8    Liver Function Tests:  Recent Labs Lab 09/19/12 1300 09/20/12 0527  AST 12 11  ALT 16 11  ALKPHOS 108 82  BILITOT 0.4 0.5  PROT 7.9 6.2  ALBUMIN 3.4* 2.6*   No results found for this basename: LIPASE,  AMYLASE,  in the last 168 hours No results found for this basename: AMMONIA,  in the last 168 hours  CBC:  Recent Labs Lab 09/19/12 1300 09/20/12 0527 09/21/12 0450 09/22/12 0345 09/23/12 0435 09/24/12 0435  WBC 4.0 2.8* 4.0 5.0 6.4 7.1  NEUTROABS 2.8  --   --   --   --   --   HGB 9.3* 8.3* 8.7* 9.1* 9.2* 9.2*  HCT 30.0* 25.5* 27.1* 28.7* 29.4* 29.1*  MCV 78.3 75.2* 75.3* 74.9* 75.6* 74.6*  PLT 275 227 234 214 234 243    Cardiac Enzymes:  Recent Labs Lab 09/19/12 1300  TROPONINI <0.30   BNP (last 3 results)  Recent Labs  08/21/12 1219 09/19/12 1300 09/20/12 0527  PROBNP 516.5* 681.2* 796.8*     CBG:  Recent Labs Lab 09/23/12 1107 09/23/12 1604 09/23/12 2133 09/24/12 0624 09/24/12 1130  GLUCAP 91 138* 99 139* 123*    Recent Results (from the past 240 hour(s))  CULTURE, BLOOD (ROUTINE X 2)     Status: None   Collection Time    09/19/12  1:00 PM      Result Value Range Status   Specimen Description BLOOD RIGHT ANTECUBITAL   Final   Special Requests BOTTLES DRAWN AEROBIC AND ANAEROBIC 4CC   Final   Culture NO GROWTH 5 DAYS   Final   Report Status 09/24/2012 FINAL   Final  CULTURE, BLOOD (ROUTINE X 2)     Status: None   Collection Time    09/19/12  1:30 PM      Result Value Range Status   Specimen Description BLOOD RIGHT ANTECUBITAL   Final   Special Requests BOTTLES DRAWN AEROBIC AND ANAEROBIC 4CC   Final   Culture NO GROWTH 5 DAYS   Final   Report Status 09/24/2012 FINAL   Final  URINE CULTURE     Status: None   Collection Time    09/19/12  2:07 PM      Result Value Range Status   Specimen Description URINE, CATHETERIZED   Final   Special Requests NONE   Final   Culture  Setup Time 09/19/2012 20:51   Final   Colony Count NO GROWTH   Final   Culture NO GROWTH   Final   Report Status 09/20/2012 FINAL   Final  MRSA PCR SCREENING     Status: None   Collection Time    09/19/12  5:41 PM      Result Value Range Status   MRSA by PCR NEGATIVE   NEGATIVE Final   Comment:            The GeneXpert MRSA Assay (FDA     approved for NASAL specimens     only), is one component of a  comprehensive MRSA colonization     surveillance program. It is not     intended to diagnose MRSA     infection nor to guide or     monitor treatment for     MRSA infections.     Studies: Dg Chest 1 View  08/26/2012  *RADIOLOGY REPORT*  Clinical Data: Aspiration.  Patient undergoing CPAP.  CHEST - 1 VIEW  Comparison: Portable chest x-ray 08/21/2012, 06/11/2011.  Two-view chest x-ray 02/08/2011.  Findings: Suboptimal inspiration accounts for crowded bronchovascular markings, especially in the lung bases, and accentuates the cardiac silhouette.  Taking this into account, cardiac silhouette moderately enlarged but stable.  Interval development of airspace consolidation in the right upper lobe and pulmonary venous hypertension since the examination 5 days ago.  No overt pulmonary edema.  IMPRESSION: Right upper lobe pneumonia.  Pulmonary venous hypertension without overt edema.  Stable cardiomegaly.   Original Report Authenticated By: Hulan Saas, M.D.    Dg Chest 2 View  09/04/2012  *RADIOLOGY REPORT*  Clinical Data: Assess infiltrates  CHEST - 2 VIEW  Comparison: 09/03/2012  Findings: Right-sided PICC line is again seen.  Bilateral infiltrates are again noted and stable.  Cardiac shadow is stable.  IMPRESSION: Stable bilateral infiltrative change   Original Report Authenticated By: Alcide Clever, M.D.    Dg Chest 2 View  09/03/2012  *RADIOLOGY REPORT*  Clinical Data: Previously seen infiltrate, check for resolution  CHEST - 2 VIEW  Comparison: 09/01/2012  Findings: The cardiac shadow is again enlarged.  Bilateral infiltrates are again seen and stable.  The right-sided PICC line is again noted.  No new focal abnormality is seen.  IMPRESSION: Stable bilateral infiltrates   Original Report Authenticated By: Alcide Clever, M.D.    Ct Head Wo Contrast  09/19/2012   *RADIOLOGY REPORT*  Clinical Data: Altered level of consciousness.  Atrial fibrillation.  Insulin dependent diabetes.  Hypertension.  CT HEAD WITHOUT CONTRAST  Technique:  Contiguous axial images were obtained from the base of the skull through the vertex without contrast.  Comparison: 08/25/2012.  Findings: Mild atrophy and chronic microvascular ischemic change. No acute cortical infarction or hemorrhage.  No mass lesion or hydrocephalus.  Right  thalamic and right putaminal lacunes.  Left middle cranial fossa arachnoid cyst. Vascular calcifications. Nasopharyngeal adenoidal hypertrophy or dependent nasopharyngeal fluid.  Calvarium intact.  No acute sinus or mastoid disease.  IMPRESSION: Chronic changes as described.  No acute intracranial findings. Stable appearance from priors.   Original Report Authenticated By: Davonna Belling, M.D.    Ct Head Wo Contrast  08/25/2012  *RADIOLOGY REPORT*  Clinical Data: Evaluate for bleed or infarct.  On Coumadin. Altered mental status.  History high blood pressure.  CT HEAD WITHOUT CONTRAST  Technique:  Contiguous axial images were obtained from the base of the skull through the vertex without contrast.  Comparison: 04/26/2007.  Findings: No intracranial hemorrhage.  Remote infarct right lenticular nucleus.  Small vessel disease type changes without CT evidence of large acute infarct.  Global atrophy without hydrocephalus.  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  No other intracranial mass lesion noted on this unenhanced exam.  Vascular calcifications.  Mastoid air cells, middle ear cavities and visualized paranasal sinuses are clear.  Mild exophthalmos.  IMPRESSION: No intracranial hemorrhage or CT evidence of large acute infarct. Please see above.   Original Report Authenticated By: Lacy Duverney, M.D.    US Renal  08/26/2012  *RADIOLOGY REPORT*  Clinical Data: Renal insufficiency. 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  RENAL/URINARY TRACT  ULTRASOUND COMPLETE  Comparison:  CT scan dated 03/09/2012  Findings:  Right Kidney:  Removed.  Left Kidney:  14.0 cm in length.  1 cm cyst on the lateral aspect of the lower pole, unchanged.  No hydronephrosis or renal cortical thinning.  Bladder:  The bladder is empty with a Foley catheter in place.  IMPRESSION: Normal appearing left kidney.  Stable tiny cyst in the lower pole.   Original Report Authenticated By: Francene Boyers, M.D.    Dg Chest Port 1 View  09/20/2012  *RADIOLOGY REPORT*  Clinical Data: Ventilator dependent respiratory failure.  Follow up CHF.  PORTABLE CHEST - 1 VIEW 09/20/2012 0537 hours:  Comparison: Portable chest x-ray yesterday and 09/01/2012  Findings: Endotracheal tube tip in satisfactory position approximately 5 cm above carina.  Nasogastric tube courses below the diaphragm into the stomach.  Cardiac silhouette enlarged but stable.  Interval slight improvement in the interstitial and airspace pulmonary edema, though moderate edema persist.  New dense atelectasis in both lower lobes.  IMPRESSION: Support apparatus satisfactory.  Slight improvement in CHF, though moderate interstitial and airspace edema persists.  New dense bilateral lower lobe atelectasis.  Cough,   Original Report Authenticated By: Hulan Saas, M.D.    Dg Chest Portable 1 View  09/19/2012  *RADIOLOGY REPORT*  Clinical Data: Shortness of breath and post intubation.  PORTABLE CHEST - 1 VIEW  Comparison: 09/04/2012  Findings: Endotracheal tube is 6.3 cm above the carina. Nasogastric tube extends into the abdomen.  Again noted are diffuse interstitial densities and possibly airspace disease in the right lower lung.  There is a cardiac pad overlying the left upper chest. Stable appearance of the cardiomediastinal silhouette.  IMPRESSION: Support apparatuses as described.  Diffuse interstitial lung densities.  Findings could represent interstitial pulmonary edema but infection cannot be excluded.   Original Report  Authenticated By: Richarda Overlie, M.D.    Dg Chest Port 1 View  09/01/2012  *RADIOLOGY REPORT*  Clinical Data: PICC line placement.  PORTABLE CHEST - 1 VIEW  Comparison: Same date.  Findings: Interval placement of right-sided PICC line with distal tip in the expected position of the right atrium.  Stable mild cardiomegaly is noted.  Bilateral airspace opacities are noted concerning for pneumonia or edema.  Bony thorax is intact.  IMPRESSION: Continued presence of bilateral lung opacities concerning for pneumonia or edema.  Right-sided PICC line has been placed with distal tip in expected position of right atrium; it is recommended to be withdrawn at least 2 cm.   Original Report Authenticated By: Lupita Raider.,  M.D.    [facility] 1 View  09/01/2012  *RADIOLOGY REPORT*  Clinical Data: Pneumonia.  PORTABLE CHEST - 1 VIEW  Comparison: Chest x-ray 08/31/2012.  Findings: Lung volumes are low.  Severe multifocal interstitial and airspace disease is noted throughout all aspects of the lungs bilaterally, increased compared with yesterday's examination. There may be small bilateral pleural effusions, however, the costophrenic sulci are excluded from the lower margin of the image. Cardiomegaly.  Cephalization of the pulmonary vasculature. The patient is rotated to the right on today's exam, resulting in distortion of the mediastinal contours and reduced diagnostic sensitivity and specificity for mediastinal pathology. Atherosclerosis of the thoracic aorta.  IMPRESSION: 1.  Persistent multifocal bilateral interstitial and airspace disease.  Given the cardiomegaly and cephalization of the pulmonary vasculature, findings are favored to reflect severe pulmonary edema related to congestive heart failure, however, superimposed multilobar pneumonia is  difficult to exclude.   Original Report Authenticated By: Trudie Reed, M.D.    Dg Chest Port 1 View  08/31/2012  *RADIOLOGY REPORT*  Clinical Data: Pneumonia  PORTABLE  CHEST - 1 VIEW  Comparison: 08/28/2012  Findings: Cardiac shadow remains mildly enlarged.  Patchy bilateral infiltrative changes are again seen.  Given some technical variations of the films there likely is stable.  Abnormality is seen.  IMPRESSION: Stable bilateral infiltrates.   Original Report Authenticated By: Alcide Clever, M.D.    Dg Chest Port 1 View  08/28/2012  *RADIOLOGY REPORT*  Clinical Data: 72 year old male with respiratory failure.  PORTABLE CHEST - 1 VIEW  Comparison: 08/27/2012 and earlier.  Findings: Portable semi upright AP view 0645 hours.  Interval regression of multilobar are and bilateral (right greater than left) fluffy pulmonary opacity. Stable cardiomegaly and mediastinal contours.  No large effusion.  No pneumothorax.  No areas of worsening ventilation.  IMPRESSION: Interval regressed but not resolved multilobar bilateral pulmonary opacity.  I favor asymmetric pulmonary edema over pneumonia. Cardiomegaly.   Original Report Authenticated By: Erskine Speed, M.D.    Dg Chest Port 1 View  08/27/2012  *RADIOLOGY REPORT*  Clinical Data: To torus failure  PORTABLE CHEST - 1 VIEW  Comparison: Portable chest x-ray of 08/26/2012  Findings: There is more patchy opacities bilaterally.  Although this could represent asymmetrical edema, pneumonia is a definite consideration.  Cardiomegaly is stable.  No bony abnormality is seen.  IMPRESSION: Some worsening of patchy airspace disease.  Possible asymmetrical edema but consider pneumonia.   Original Report Authenticated By: Dwyane Dee, M.D.     Scheduled Meds: . amiodarone  400 mg Oral BID  . antiseptic oral rinse  15 mL Mouth Rinse QID  . chlorhexidine  15 mL Mouth Rinse BID  . diltiazem  90 mg Oral Q6H  . insulin aspart  0-20 Units Subcutaneous TID AC & HS  . metoprolol tartrate  50 mg Oral BID  . pantoprazole  40 mg Oral Daily  . warfarin  5 mg Oral q1800  . Warfarin - Pharmacist Dosing Inpatient   Does not apply q1800   Continuous  Infusions: . sodium chloride 50 mL/hr at 09/24/12 1610    Active Problems:   * No active hospital problems. *    Time spent: 40 minutes   [place]  [facility] Pager [phone]. If 8PM-8AM, please contact night-coverage at [email or web address], password [facility] 09/24/2012, 12:07 PM  LOS: 5 days

## 2012-09-24 NOTE — Evaluation (Signed)
Physical Therapy Evaluation Patient Details Name: [name] MRN: [id number] DOB: [birth year] Today's Date: 09/24/2012 Time: 2725-3664 PT Time Calculation (min): 31 min  PT Assessment / Plan / Recommendation Clinical Impression  pt adm with AMS.  Pt recently adm with PNA and arib with RVR., Now afib recently converted and pt suffering from Resp distress during to pulmonary effusions.  On eval, pt is weak with little tolerance for activity and has decr balance/gait instability.  Pt can benefit from PT to maximize I    PT Assessment  Patient needs continued PT services    Follow Up Recommendations  SNF;Other (comment);Home health PT;Supervision for mobility/OOB (Should go to rehab, but will likely choose home)    Does the patient have the potential to tolerate intense rehabilitation      Barriers to Discharge        Equipment Recommendations  None recommended by PT    Recommendations for Other Services     Frequency Min 3X/week    Precautions / Restrictions Precautions Precautions: Fall Restrictions Weight Bearing Restrictions: No   Pertinent Vitals/Pain sats on RA dropped to 89/90%, EHR 91      Mobility  Bed Mobility Bed Mobility: Supine to Sit;Sitting - Scoot to Edge of Bed Supine to Sit: 4: Min guard;With rails;HOB flat Sitting - Scoot to Edge of Bed: 4: Min guard Details for Bed Mobility Assistance: Needed use of rails for safety, no assist needed Transfers Transfers: Sit to Stand;Stand to Sit Sit to Stand: 4: Min guard Stand to Sit: 4: Min guard Details for Transfer Assistance: min guard for safety Ambulation/Gait Ambulation/Gait Assistance: 4: Min assist Ambulation Distance (Feet): 20 Feet Assistive device: None Ambulation/Gait Assistance Details: mildly unsteady and tremulous gait Gait Pattern: Step-through pattern Stairs: No Wheelchair Mobility Wheelchair Mobility: No    Exercises     PT Diagnosis: Difficulty walking;Generalized weakness  PT  Problem List: Decreased strength;Decreased activity tolerance;Decreased balance;Decreased mobility;Decreased knowledge of use of DME;Cardiopulmonary status limiting activity PT Treatment Interventions: Gait training;Stair training;Therapeutic activities;Functional mobility training;Balance training;Patient/family education   PT Goals Acute Rehab PT Goals PT Goal Formulation: With patient Time For Goal Achievement: 10/07/12 Potential to Achieve Goals: Fair Pt will go Supine/Side to Sit: with supervision;with HOB 0 degrees PT Goal: Supine/Side to Sit - Progress: Goal set today Pt will go Sit to Supine/Side: with supervision;with HOB 0 degrees PT Goal: Sit to Supine/Side - Progress: Goal set today Pt will go Sit to Stand: with supervision;with upper extremity assist PT Goal: Sit to Stand - Progress: Goal set today Pt will go Stand to Sit: with supervision;with upper extremity assist PT Goal: Stand to Sit - Progress: Goal set today Pt will Ambulate: 51 - 150 feet;with supervision;with least restrictive assistive device PT Goal: Ambulate - Progress: Goal set today Pt will Go Up / Down Stairs: 3-5 stairs;with min assist;with rail(s) PT Goal: Up/Down Stairs - Progress: Goal set today  Visit Information  Last PT Received On: 09/24/12 Assistance Needed: +1    Subjective Data  Subjective: Did you not see the 2 men that were in here when you came in Patient Stated Goal: Home   Prior Functioning  Home Living Lives With: Friend(s) Available Help at Discharge: Friend(s);Available 24 hours/day Type of Home: House Home Access: Stairs to enter [employer] of Steps: 4 Entrance Stairs-Rails: Right;Left;Can reach both Home Layout: One level Bathroom Shower/Tub: Engineer, manufacturing systems: Standard Home Adaptive Equipment: Shower chair with back;Walker - rolling Prior Function Level of Independence: Independent Able to  Take Stairs?: Yes Driving: Yes Vocation: Full time  employment Communication Communication: No difficulties    Cognition  Cognition Arousal/Alertness: Awake/alert Overall Cognitive Status: Impaired/Different from baseline Area of Impairment: Orientation;Safety/judgement    Extremity/Trunk Assessment Right Lower Extremity Assessment RLE ROM/Strength/Tone: [facility] for tasks assessed;Deficits RLE ROM/Strength/Tone Deficits: bil grossly weak at 4/5 RLE Sensation: History of peripheral neuropathy Left Lower Extremity Assessment LLE ROM/Strength/Tone: [facility] for tasks assessed;Deficits LLE ROM/Strength/Tone Deficits: see RLE LLE Sensation: History of peripheral neuropathy Trunk Assessment Trunk Assessment: Normal   Balance Balance Balance Assessed: Yes Static Standing Balance Static Standing - Balance Support: During functional activity;Left upper extremity supported;Right upper extremity supported Static Standing - Level of Assistance: 4: Min assist (washing face at the sink) Static Standing - Comment/# of Minutes: 3-4 min Dynamic Standing Balance Dynamic Standing - Balance Support: During functional activity;Left upper extremity supported;Right upper extremity supported Dynamic Standing - Level of Assistance: 4: Min assist  End of Session PT - End of Session Patient left: in chair;with call bell/phone within reach;with chair alarm set;with nursing in room;with family/visitor present Nurse Communication: Mobility status  GP     [name], Eliseo Gum 09/24/2012, 5:32 PM 09/24/2012  Hilbert Bing, PT [phone] (pager)

## 2012-09-25 DIAGNOSIS — R404 Transient alteration of awareness: Secondary | ICD-10-CM

## 2012-09-25 DIAGNOSIS — R4182 Altered mental status, unspecified: Secondary | ICD-10-CM

## 2012-09-25 DIAGNOSIS — H5316 Psychophysical visual disturbances: Secondary | ICD-10-CM

## 2012-09-25 LAB — BASIC METABOLIC PANEL
CO2: 27 mEq/L (ref 19–32)
Calcium: 9 mg/dL (ref 8.4–10.5)
Chloride: 106 mEq/L (ref 96–112)
Creatinine, Ser: 1.27 mg/dL (ref 0.50–1.35)
Glucose, Bld: 94 mg/dL (ref 70–99)

## 2012-09-25 LAB — CBC
HCT: 26.5 % — ABNORMAL LOW (ref 39.0–52.0)
Hemoglobin: 8.7 g/dL — ABNORMAL LOW (ref 13.0–17.0)
MCH: 24.2 pg — ABNORMAL LOW (ref 26.0–34.0)
MCV: 73.6 fL — ABNORMAL LOW (ref 78.0–100.0)
RBC: 3.6 MIL/uL — ABNORMAL LOW (ref 4.22–5.81)

## 2012-09-25 LAB — GLUCOSE, CAPILLARY: Glucose-Capillary: 101 mg/dL — ABNORMAL HIGH (ref 70–99)

## 2012-09-25 MED ORDER — QUETIAPINE FUMARATE 50 MG PO TABS
50.0000 mg | ORAL_TABLET | Freq: Every day | ORAL | Status: DC
  Administered 2012-09-25 – 2012-10-04 (×10): 50 mg via ORAL
  Filled 2012-09-25 (×11): qty 1

## 2012-09-25 NOTE — Progress Notes (Signed)
Clinical Social Work Department BRIEF PSYCHOSOCIAL ASSESSMENT 09/25/2012  Patient:  [name], [name]     Account Number:  [id number]     Admit date:  09/19/2012  Clinical Social Worker:  Lourdes Sledge  Date/Time:  09/25/2012 11:33 AM  Referred by:  Care Management  Date Referred:  09/25/2012 Referred for  SNF Placement   Other Referral:   Interview type:  Family Other interview type:   Herbie Drape [phone] and Lilian Coma [phone]    PSYCHOSOCIAL DATA Living Status:  ALONE Admitted from facility:   Level of care:   Primary support name:  Herbie Drape [phone] and Lilian Coma [phone] Primary support relationship to patient:  FRIEND Degree of support available:    CURRENT CONCERNS Current Concerns  Post-Acute Placement   Other Concerns:    SOCIAL WORK ASSESSMENT / PLAN Covering CSW informed that PT is recommending SNF for pt.    Pt currently presents disoriented, on restraints and with a Recruitment consultant. CSW observed that no friends or family are present. CSW contacted pt main contact Mr. Irving Burton and intoduced herself and role. Mr. Irving Burton confirmed that pt lives alone however Mr. Irving Burton has been living with him providing support. CSW informed Mr. Irving Burton of PT recommendations and pt need for extensive support at discharge. Mr. Irving Burton agreed that pt would need extensive support at dc and possibly more than he could provide for pt however Mr. Irving Burton prefers for pt not to dc to a SNF. Mr. Irving Burton requested that CSW speak with pt other friend Ms. Lilian Coma.    CSW contacted pt friend Ms. Manson Passey who understood concerns with pt returning home at dc. Ms. Manson Passey stated that she and Mr. Irving Burton would discuss SNF placement and contact CSW back at 2:30p today.    CSW to await a SNF search until confirming with pt support.   Assessment/plan status:  Psychosocial Support/Ongoing Assessment of Needs Other assessment/ plan:   Information/referral to community resources:   CSW will  provide a SNF list when appropriate.    PATIENT'S/FAMILY'S RESPONSE TO PLAN OF CARE: Pt presents disoriented and combative. CSW completed assessment with pt friends Herbie Drape [phone] and Lilian Coma [phone]. CSW to remain following and will do a SNF search if pt supports are agreaable.        Theresia Bough, MSW, Theresia Majors [phone]

## 2012-09-25 NOTE — Progress Notes (Signed)
[facility] PROGRESS NOTE  [name] WJX:[id number] DOB: 05/15/1941 DOA: 09/19/2012 PCP: No primary provider on file.  Assessment/Plan:  BRIEF PATIENT DESCRIPTION:  72 yo male brought to [facility] ED with altered mental status. Intubated for airway protection. Transferred to [facility] for further Tx under PCCM service. Had recent admit for aspiration PNA, A fib with RVR. 4/12 Transfer to [facility]. Went into another episode A fib with RVR on 4/14.  STUDIES:  CT chest 4/12 >> negative for acute bleed  LINES / TUBES:  ETT 4/12 >> 4/13  CULTURES:  Blood 4/12 >> Pending  Urine 4/12 >> No growth  Sputum 4/12 >> Pending  ANTIBIOTICS:  Vancomycin 4/12 >>4/14  Zosyn 4/12 >>4/12  Ceftriaxone 4/14>>>plan stop 4/16    Paroxysmal  Atrial fibrillation  Continue amiodarone load. At the time of discharge, would send home on amiodarone 400 mg daily. He will likely need Cardizem CD 360 mg daily. He'll need metoprolol 50 mg by mouth twice a day.  Given poor cognition , not a good candidate for coumadin   Acute respiratory failure 2nd to pulmonary edema +/- PNA. Improving.  Hx of OSA.  Extubated, stable oxygen requirements    Creatinine improved 1.48 > 1.27.  S/p nephrectomy.  Creatinine is stable    Hx of Hepatis. Normal liver enzymes  Anemia of chronic disease. Hgb 8.3. INR improving 6.73 >>>3.1 >2.8.  Coagulopathy 2nd to coumadin for A fib.  Monitor INR, may need to be switched to xarelto , if affordable    Recent therapy for aspiration PNA with persistent infiltrate on CXR. Respiratory and blood cultures - Pending  D/ced Vancomycin, zosyn  Continue with ceftriaxone, stop date 4/17,   Diabetes mellitus type 2, continue SSI   Acute encephalopathy - resolved. ? Cause >> may be from opiates, HTN encephalopathy, hypoxia. CT head negative for acute findings , given Persistent confusion, negative MRI of the brain,called for  psychiatric consultation, started seroquel Started B12 as mildly low  during previous admit    Code Status: full  Family Communication: family updated about patient's clinical progress  Disposition Plan: As above  Consultants:  Cardiology  Critical care      HPI/Subjective: Confused ,hallucinating   Objective: Filed Vitals:   09/24/12 2016 09/24/12 2142 09/25/12 0515 09/25/12 0950  BP: 165/83 157/81 165/81 157/74  Pulse: 89 88 86 100  Temp: 97.3 F (36.3 C) 98.2 F (36.8 C) 98.2 F (36.8 C) 97.8 F (36.6 C)  TempSrc: Oral Oral Oral Oral  Resp: 20 20 20    Height:      Weight:   100.9 kg (222 lb 7.1 oz)   SpO2: 100% 99% 100% 98%    Intake/Output Summary (Last 24 hours) at 09/25/12 1115 Last data filed at 09/25/12 0726  Gross per 24 hour  Intake   1150 ml  Output      0 ml  Net   1150 ml    Exam:  HENT:  Head: Atraumatic.  Nose: Nose normal.  Mouth/Throat: Oropharynx is clear and moist.  Eyes: Conjunctivae are normal. Pupils are equal, round, and reactive to light. No scleral icterus.  Neck: Neck supple. No tracheal deviation present.  Cardiovascular: Normal rate, regular rhythm, normal heart sounds and intact distal pulses.  Pulmonary/Chest: Effort normal and breath sounds normal. No respiratory distress.  Abdominal: Soft. Normal appearance and bowel sounds are normal. She exhibits no distension. There is no tenderness.  Musculoskeletal: She exhibits no edema and no tenderness.  Neurological: She is  alert. No cranial nerve deficit.    Data Reviewed: Basic Metabolic Panel:  Recent Labs Lab 09/21/12 0450 09/22/12 0345 09/23/12 0435 09/24/12 0435 09/25/12 0656  NA 140 139 139 138 141  K 4.1 3.6 4.7 4.2 3.8  CL 101 102 106 104 106  CO2 32 29 26 25 27   GLUCOSE 145* 183* 147* 163* 94  BUN 12 8 12 15 14   CREATININE 1.48* 1.26 1.25 1.17 1.27  CALCIUM 8.4 8.4 8.7 8.8 9.0    Liver Function Tests:  Recent Labs Lab 09/19/12 1300 09/20/12 0527  AST 12 11  ALT 16 11  ALKPHOS 108 82  BILITOT 0.4 0.5  PROT 7.9 6.2   ALBUMIN 3.4* 2.6*   No results found for this basename: LIPASE, AMYLASE,  in the last 168 hours No results found for this basename: AMMONIA,  in the last 168 hours  CBC:  Recent Labs Lab 09/19/12 1300  09/21/12 0450 09/22/12 0345 09/23/12 0435 09/24/12 0435 09/25/12 0656  WBC 4.0  < > 4.0 5.0 6.4 7.1 5.4  NEUTROABS 2.8  --   --   --   --   --   --   HGB 9.3*  < > 8.7* 9.1* 9.2* 9.2* 8.7*  HCT 30.0*  < > 27.1* 28.7* 29.4* 29.1* 26.5*  MCV 78.3  < > 75.3* 74.9* 75.6* 74.6* 73.6*  PLT 275  < > 234 214 234 243 227  < > = values in this interval not displayed.  Cardiac Enzymes:  Recent Labs Lab 09/19/12 1300  TROPONINI <0.30   BNP (last 3 results)  Recent Labs  08/21/12 1219 09/19/12 1300 09/20/12 0527  PROBNP 516.5* 681.2* 796.8*     CBG:  Recent Labs Lab 09/24/12 1130 09/24/12 1621 09/24/12 2008 09/24/12 2147 09/25/12 0638  GLUCAP 123* 113* 128* 126* 98    Recent Results (from the past 240 hour(s))  CULTURE, BLOOD (ROUTINE X 2)     Status: None   Collection Time    09/19/12  1:00 PM      Result Value Range Status   Specimen Description BLOOD RIGHT ANTECUBITAL   Final   Special Requests BOTTLES DRAWN AEROBIC AND ANAEROBIC 4CC   Final   Culture NO GROWTH 5 DAYS   Final   Report Status 09/24/2012 FINAL   Final  CULTURE, BLOOD (ROUTINE X 2)     Status: None   Collection Time    09/19/12  1:30 PM      Result Value Range Status   Specimen Description BLOOD RIGHT ANTECUBITAL   Final   Special Requests BOTTLES DRAWN AEROBIC AND ANAEROBIC 4CC   Final   Culture NO GROWTH 5 DAYS   Final   Report Status 09/24/2012 FINAL   Final  URINE CULTURE     Status: None   Collection Time    09/19/12  2:07 PM      Result Value Range Status   Specimen Description URINE, CATHETERIZED   Final   Special Requests NONE   Final   Culture  Setup Time 09/19/2012 20:51   Final   Colony Count NO GROWTH   Final   Culture NO GROWTH   Final   Report Status 09/20/2012 FINAL    Final  MRSA PCR SCREENING     Status: None   Collection Time    09/19/12  5:41 PM      Result Value Range Status   MRSA by PCR NEGATIVE  NEGATIVE Final   Comment:  The GeneXpert MRSA Assay (FDA     approved for NASAL specimens     only), is one component of a     comprehensive MRSA colonization     surveillance program. It is not     intended to diagnose MRSA     infection nor to guide or     monitor treatment for     MRSA infections.     Studies: Dg Chest 2 View  09/04/2012  *RADIOLOGY REPORT*  Clinical Data: Assess infiltrates  CHEST - 2 VIEW  Comparison: 09/03/2012  Findings: Right-sided PICC line is again seen.  Bilateral infiltrates are again noted and stable.  Cardiac shadow is stable.  IMPRESSION: Stable bilateral infiltrative change   Original Report Authenticated By: Alcide Clever, M.D.    Dg Chest 2 View  09/03/2012  *RADIOLOGY REPORT*  Clinical Data: Previously seen infiltrate, check for resolution  CHEST - 2 VIEW  Comparison: 09/01/2012  Findings: The cardiac shadow is again enlarged.  Bilateral infiltrates are again seen and stable.  The right-sided PICC line is again noted.  No new focal abnormality is seen.  IMPRESSION: Stable bilateral infiltrates   Original Report Authenticated By: Alcide Clever, M.D.    Ct Head Wo Contrast  09/24/2012  *RADIOLOGY REPORT*  Clinical Data: Head trauma secondary to a fall.  Scalp contusion.  CT HEAD WITHOUT CONTRAST  Technique:  Contiguous axial images were obtained from the base of the skull through the vertex without contrast.  Comparison: CT scan dated 09/19/2012  Findings: There is no acute intracranial hemorrhage, infarction, or mass lesion.  There are old lacunar infarcts in the right basal ganglia.  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  Osseous structures are normal.  IMPRESSION: No acute abnormalities.   Original Report Authenticated By: Francene Boyers, M.D.    Ct Head Wo Contrast  09/19/2012  *RADIOLOGY  REPORT*  Clinical Data: Altered level of consciousness.  Atrial fibrillation.  Insulin dependent diabetes.  Hypertension.  CT HEAD WITHOUT CONTRAST  Technique:  Contiguous axial images were obtained from the base of the skull through the vertex without contrast.  Comparison: 08/25/2012.  Findings: Mild atrophy and chronic microvascular ischemic change. No acute cortical infarction or hemorrhage.  No mass lesion or hydrocephalus.  Right  thalamic and right putaminal lacunes.  Left middle cranial fossa arachnoid cyst. Vascular calcifications. Nasopharyngeal adenoidal hypertrophy or dependent nasopharyngeal fluid.  Calvarium intact.  No acute sinus or mastoid disease.  IMPRESSION: Chronic changes as described.  No acute intracranial findings. Stable appearance from priors.   Original Report Authenticated By: Davonna Belling, M.D.    Mr Brain Wo Contrast  09/24/2012  *RADIOLOGY REPORT*  Clinical Data: Confusion.  Encephalopathy.  MRI HEAD WITHOUT CONTRAST  Technique:  Multiplanar, multiecho pulse sequences of the brain and surrounding structures were obtained according to standard protocol without intravenous contrast.  Comparison: CT 09/19/2012  Findings: Negative for acute infarct.  Generalized atrophy.  Chronic ischemic changes are present in the white matter.  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  Small chronic infarct right posterior cerebellum.  Arachnoid cyst anterior to the left temporal lobe measuring 25 x 30 mm.  Negative for intracranial hemorrhage.  Negative for mass lesion.  IMPRESSION: Chronic ischemic changes.  No acute infarct.   Original Report Authenticated By: Janeece Riggers, M.D.    US Renal  08/26/2012  *RADIOLOGY REPORT*  Clinical Data: Renal insufficiency. 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  RENAL/URINARY TRACT  ULTRASOUND COMPLETE  Comparison:  CT scan dated 03/09/2012  Findings:  Right Kidney:  Removed.  Left Kidney:  14.0 cm in length.  1 cm cyst on the lateral  aspect of the lower pole, unchanged.  No hydronephrosis or renal cortical thinning.  Bladder:  The bladder is empty with a Foley catheter in place.  IMPRESSION: Normal appearing left kidney.  Stable tiny cyst in the lower pole.   Original Report Authenticated By: Francene Boyers, M.D.    Dg Chest Port 1 View  09/20/2012  *RADIOLOGY REPORT*  Clinical Data: Ventilator dependent respiratory failure.  Follow up CHF.  PORTABLE CHEST - 1 VIEW 09/20/2012 0537 hours:  Comparison: Portable chest x-ray yesterday and 09/01/2012  Findings: Endotracheal tube tip in satisfactory position approximately 5 cm above carina.  Nasogastric tube courses below the diaphragm into the stomach.  Cardiac silhouette enlarged but stable.  Interval slight improvement in the interstitial and airspace pulmonary edema, though moderate edema persist.  New dense atelectasis in both lower lobes.  IMPRESSION: Support apparatus satisfactory.  Slight improvement in CHF, though moderate interstitial and airspace edema persists.  New dense bilateral lower lobe atelectasis.  Cough,   Original Report Authenticated By: Hulan Saas, M.D.    Dg Chest Portable 1 View  09/19/2012  *RADIOLOGY REPORT*  Clinical Data: Shortness of breath and post intubation.  PORTABLE CHEST - 1 VIEW  Comparison: 09/04/2012  Findings: Endotracheal tube is 6.3 cm above the carina. Nasogastric tube extends into the abdomen.  Again noted are diffuse interstitial densities and possibly airspace disease in the right lower lung.  There is a cardiac pad overlying the left upper chest. Stable appearance of the cardiomediastinal silhouette.  IMPRESSION: Support apparatuses as described.  Diffuse interstitial lung densities.  Findings could represent interstitial pulmonary edema but infection cannot be excluded.   Original Report Authenticated By: Richarda Overlie, M.D.    Dg Chest Port 1 View  09/01/2012  *RADIOLOGY REPORT*  Clinical Data: PICC line placement.  PORTABLE CHEST - 1 VIEW   Comparison: Same date.  Findings: Interval placement of right-sided PICC line with distal tip in the expected position of the right atrium.  Stable mild cardiomegaly is noted.  Bilateral airspace opacities are noted concerning for pneumonia or edema.  Bony thorax is intact.  IMPRESSION: Continued presence of bilateral lung opacities concerning for pneumonia or edema.  Right-sided PICC line has been placed with distal tip in expected position of right atrium; it is recommended to be withdrawn at least 2 cm.   Original Report Authenticated By: Lupita Raider.,  M.D.    [facility] 1 View  09/01/2012  *RADIOLOGY REPORT*  Clinical Data: Pneumonia.  PORTABLE CHEST - 1 VIEW  Comparison: Chest x-ray 08/31/2012.  Findings: Lung volumes are low.  Severe multifocal interstitial and airspace disease is noted throughout all aspects of the lungs bilaterally, increased compared with yesterday's examination. There may be small bilateral pleural effusions, however, the costophrenic sulci are excluded from the lower margin of the image. Cardiomegaly.  Cephalization of the pulmonary vasculature. The patient is rotated to the right on today's exam, resulting in distortion of the mediastinal contours and reduced diagnostic sensitivity and specificity for mediastinal pathology. Atherosclerosis of the thoracic aorta.  IMPRESSION: 1.  Persistent multifocal bilateral interstitial and airspace disease.  Given the cardiomegaly and cephalization of the pulmonary vasculature, findings are favored to reflect severe pulmonary edema related to congestive heart failure, however, superimposed multilobar pneumonia is difficult to exclude.   Original  Report Authenticated By: Trudie Reed, M.D.    Dg Chest Port 1 View  08/31/2012  *RADIOLOGY REPORT*  Clinical Data: Pneumonia  PORTABLE CHEST - 1 VIEW  Comparison: 08/28/2012  Findings: Cardiac shadow remains mildly enlarged.  Patchy bilateral infiltrative changes are again seen.  Given some  technical variations of the films there likely is stable.  Abnormality is seen.  IMPRESSION: Stable bilateral infiltrates.   Original Report Authenticated By: Alcide Clever, M.D.    Dg Chest Port 1 View  08/28/2012  *RADIOLOGY REPORT*  Clinical Data: 72 year old male with respiratory failure.  PORTABLE CHEST - 1 VIEW  Comparison: 08/27/2012 and earlier.  Findings: Portable semi upright AP view 0645 hours.  Interval regression of multilobar are and bilateral (right greater than left) fluffy pulmonary opacity. Stable cardiomegaly and mediastinal contours.  No large effusion.  No pneumothorax.  No areas of worsening ventilation.  IMPRESSION: Interval regressed but not resolved multilobar bilateral pulmonary opacity.  I favor asymmetric pulmonary edema over pneumonia. Cardiomegaly.   Original Report Authenticated By: Erskine Speed, M.D.    Dg Chest Port 1 View  08/27/2012  *RADIOLOGY REPORT*  Clinical Data: To torus failure  PORTABLE CHEST - 1 VIEW  Comparison: Portable chest x-ray of 08/26/2012  Findings: There is more patchy opacities bilaterally.  Although this could represent asymmetrical edema, pneumonia is a definite consideration.  Cardiomegaly is stable.  No bony abnormality is seen.  IMPRESSION: Some worsening of patchy airspace disease.  Possible asymmetrical edema but consider pneumonia.   Original Report Authenticated By: Dwyane Dee, M.D.     Scheduled Meds: . amiodarone  400 mg Oral BID  . antiseptic oral rinse  15 mL Mouth Rinse QID  . chlorhexidine  15 mL Mouth Rinse BID  . diltiazem  90 mg Oral Q6H  . insulin aspart  0-20 Units Subcutaneous TID AC & HS  . metoprolol tartrate  50 mg Oral BID  . pantoprazole  40 mg Oral Daily  . QUEtiapine  25 mg Oral QHS  . warfarin  5 mg Oral q1800  . Warfarin - Pharmacist Dosing Inpatient   Does not apply q1800   Continuous Infusions: . sodium chloride 50 mL/hr at 09/24/12 1610    Active Problems:   * No active hospital problems. *    Time  spent: 40 minutes   [facility]  [facility] Pager [phone]. If 8PM-8AM, please contact night-coverage at [email or web address], password [facility] 09/25/2012, 11:15 AM  LOS: 6 days

## 2012-09-25 NOTE — Progress Notes (Signed)
RN observes on tele Pt converted to NSR with HR in the 70's.  RN conferred with central monitor tech who reports noting NSR at 1500.  Pt sleeping comfortably with sitter at bedside.  Will con't plan of care.

## 2012-09-25 NOTE — Progress Notes (Signed)
PT Cancellation Note  Patient Details Name: CHINEDU AGUSTIN MRN: [id number] DOB: Oct 12, 1940   Cancelled Treatment: Pt back in afib with hallucinations over night. Sleeping soundly at this time with HR ranging from 90-120's. Will hold today and follow up a later date as pt is more alert and medically stable for therapy.  Sallyanne Kuster 09/25/2012, 12:27 PM  Sallyanne Kuster, PTA Office- [phone]

## 2012-09-25 NOTE — Progress Notes (Addendum)
Subjective:  Very sleepy but will awaken to talk.  Still confused.  Not SOB, no chest pain.  Objective:  Vital Signs in the last 24 hours: BP 165/81  Pulse 86  Temp(Src) 98.2 F (36.8 C) (Oral)  Resp 20  Ht 5\' 7"  (1.702 m)  Wt 100.9 kg (222 lb 7.1 oz)  BMI 34.83 kg/m2  SpO2 100%  Physical Exam: Obese somnolent WM twitching but is able to arouse Lungs:  Clear Cardiac:  Regular rhythm, normal S1 and S2, no S3 Extremities:  No edema present  Intake/Output from previous day: 04/17 0701 - 04/18 0700 In: 1060 [P.O.:1060] Out: -  Weight Filed Weights   09/23/12 0415 09/24/12 0308 09/25/12 0515  Weight: 101.4 kg (223 lb 8.7 oz) 101.1 kg (222 lb 14.2 oz) 100.9 kg (222 lb 7.1 oz)    Lab Results: Basic Metabolic Panel:  Recent Labs  45/40/98 0435 09/25/12 0656  NA 138 141  K 4.2 3.8  CL 104 106  CO2 25 27  GLUCOSE 163* 94  BUN 15 14  CREATININE 1.17 1.27    CBC:  Recent Labs  09/24/12 0435 09/25/12 0656  WBC 7.1 5.4  HGB 9.2* 8.7*  HCT 29.1* 26.5*  MCV 74.6* 73.6*  PLT 243 227    BNP    Component Value Date/Time   PROBNP 796.8* 09/20/2012 0527    PROTIME: Lab Results  Component Value Date   INR 2.72* 09/25/2012   INR 2.67* 09/24/2012   INR 2.63* 09/23/2012    Telemetry: Sinus with LBBB initially but having episodes of PAF and some rapid response.  Assessment/Plan: 1. Atrial fibrillation intermittent NSR. 2. LBBB 3. CAD with stent 4. delerium  Rec:  Having paroxysmal a fib.  He was in sinus when I went in room  He is somnolent and twitching.  Plan outlined by Dr. Eldridge Dace. Continue amiodarone. Social issues and followup being looked.      Darden Palmer  MD [facility] Card[facility]  09/25/2012, 9:38 AM

## 2012-09-25 NOTE — Clinical Social Work Placement (Signed)
     Clinical Social Work Department CLINICAL SOCIAL WORK PLACEMENT NOTE 09/25/2012  Patient:  LANKFORD, GUTZMER  Account Number:  [id number] Admit date:  09/19/2012  Clinical Social Worker:  Theresia Bough, Theresia Majors  Date/time:  09/25/2012 04:03 PM  Clinical Social Work is seeking post-discharge placement for this patient at the following level of care:   SKILLED NURSING   (*CSW will update this form in Epic as items are completed)   09/25/2012  Patient/family provided with [facility] Department of Clinical Social Works list of facilities offering this level of care within the geographic area requested by the patient (or if unable, by the patients family).  09/25/2012  Patient/family informed of their freedom to choose among providers that offer the needed level of care, that participate in Medicare, Medicaid or managed care program needed by the patient, have an available bed and are willing to accept the patient.  09/25/2012  Patient/family informed of [facility] ownership interest in [facility], as well as of the fact that they are under no obligation to receive care at this facility.  PASARR submitted to EDS on 09/25/2012 PASARR number received from EDS on   FL2 transmitted to all facilities in geographic area requested by pt/family on  09/25/2012 FL2 transmitted to all facilities within larger geographic area on   Patient informed that his/her managed care company has contracts with or will negotiate with  certain facilities, including the following:     Patient/family informed of bed offers received:   Patient chooses bed at  Physician recommends and patient chooses bed at    Patient to be transferred to  on   Patient to be transferred to facility by   The following physician request were entered in Epic:   Additional Comments:

## 2012-09-25 NOTE — Progress Notes (Signed)
Pt back in Afib HR 80-120.  MD Donnie Aho aware.  No new orders.  Pt without CP or SOB.  Will con't plan of care.

## 2012-09-25 NOTE — Progress Notes (Signed)
ANTICOAGULATION CONSULT NOTE - Follow Up Consult  Pharmacy Consult for Coumadin Indication: atrial fibrillation  No Known Allergies  Patient Measurements: Height: 5\' 7"  (170.2 cm) Weight: 222 lb 7.1 oz (100.9 kg) IBW/kg (Calculated) : 66.1  Vital Signs: Temp: 97.8 F (36.6 C) (04/18 0950) Temp src: Oral (04/18 0950) BP: 157/74 mmHg (04/18 0950) Pulse Rate: 100 (04/18 0950)  Labs:  Recent Labs  09/23/12 0435 09/24/12 0435 09/25/12 0656  HGB 9.2* 9.2* 8.7*  HCT 29.4* 29.1* 26.5*  PLT 234 243 227  LABPROT 26.8* 27.1* 27.5*  INR 2.63* 2.67* 2.72*  CREATININE 1.25 1.17 1.27    Estimated Creatinine Clearance: 62.1 ml/min (by C-G formula based on Cr of 1.27).   Medications:  Scheduled:  . amiodarone  400 mg Oral BID  . antiseptic oral rinse  15 mL Mouth Rinse QID  . chlorhexidine  15 mL Mouth Rinse BID  . [COMPLETED] cyanocobalamin  1,000 mcg Intramuscular Once  . diltiazem  90 mg Oral Q6H  . insulin aspart  0-20 Units Subcutaneous TID AC & HS  . metoprolol tartrate  50 mg Oral BID  . pantoprazole  40 mg Oral Daily  . QUEtiapine  25 mg Oral QHS  . warfarin  5 mg Oral q1800  . Warfarin - Pharmacist Dosing Inpatient   Does not apply q1800    Assessment: [name] on Coumadin PTA for afib.  Presented with INR>6 (no bleeding) and received 5mg  Vit K.  INR now therapeutic 2.72 and Coumadin restarted 4/14.  Home Coumadin dose of 7.5 mg daily was too high based on presenting INR and new addition of Amiodarone can also elevate INR.  Coumadin 5mg  daily as maintenance regimen has been holding pt steady, continue to monitor daily INR while in hospital.  Noted increased confusion and concern for discharge home on anticoagulation.  Will continue to follow with you until a decision is made.  Goal of Therapy:  INR 2-3   Plan:  Coumadin 5mg  PO daily. Daily INR.  Leota Sauers Pharm.D. CPP, BCPS Clinical Pharmacist [phone] 09/25/2012 9:57 AM

## 2012-09-25 NOTE — Progress Notes (Signed)
Clinical Child psychotherapist (CSW) received a returned call from pt friend Ms. Manson Passey requesting that CSW do a SNF search for [place] and [place]. CSW will request weekend CSW to provide bed offers to pt friend over the weekend.  Theresia Bough, MSW, Theresia Majors [phone]

## 2012-09-25 NOTE — Consult Note (Signed)
Reason for Consult: Altered mental status, Delirium  Referring Physician: Dr. Jenny Reichmann is an 72 y.o. male.  HPI: Patient was seen and chart reviewed. Patient has been struggling with altered mental status and visual hallucinations of flies infront of the eyes and denied auditory and tactile hallucinations. He was initially brought to [facility] ED with altered mental statu and required  Intubated for airway protection. He was transferred to [facility] for further Tx under PCCM service. Patient denied history of psychiatric hospitalization and medication management. He denied substance abuse. He has no safety concerns.  Had recent admit for aspiration PNA, A fib with RVR. 4/12 Transfer to [facility]. Went into another episode A fib with RVR on 4/14   MSE: Patient appeared very tired and sleepy but able to participate on evaluation. He has been dishelveld with unshaven beard and using continuous oxygen supply by canula. He has normal speech and denied SI/HI and not responding to internal stimuli.  Past Medical History  Diagnosis Date  . Coronary artery disease   . Hypertension   . Cancer   . Pneumonia   . CHF (congestive heart failure)   . High cholesterol   . Diabetes mellitus   . Hepatitis   . Renal disorder     kidney cancer  . LBBB (left bundle branch block)   . Paroxysmal atrial fibrillation   . Sleep apnea     Past Surgical History  Procedure Laterality Date  . Back surgery    . Coronary angioplasty with stent placement    . Nephrectomy      Family History  Problem Relation Age of Onset  . Hyperlipidemia Mother   . Heart attack Father   . Diabetes Mother     Social History:  reports that he has quit smoking. He does not have any smokeless tobacco history on file. He reports that he does not drink alcohol or use illicit drugs.  Allergies: No Known Allergies  Medications: I have reviewed the patient's current medications.  Results for orders placed during the hospital  encounter of 09/19/12 (from the past 48 hour(s))  GLUCOSE, CAPILLARY     Status: Abnormal   Collection Time    09/23/12  4:04 PM      Result Value Range   Glucose-Capillary 138 (*) 70 - 99 mg/dL  GLUCOSE, CAPILLARY     Status: None   Collection Time    09/23/12  9:33 PM      Result Value Range   Glucose-Capillary 99  70 - 99 mg/dL  CBC     Status: Abnormal   Collection Time    09/24/12  4:35 AM      Result Value Range   WBC 7.1  4.0 - 10.5 K/uL   RBC 3.90 (*) 4.22 - 5.81 MIL/uL   Hemoglobin 9.2 (*) 13.0 - 17.0 g/dL   HCT 16.1 (*) 09.6 - 04.5 %   MCV 74.6 (*) 78.0 - 100.0 fL   MCH 23.6 (*) 26.0 - 34.0 pg   MCHC 31.6  30.0 - 36.0 g/dL   RDW 40.9 (*) 81.1 - 91.4 %   Platelets 243  150 - 400 K/uL  BASIC METABOLIC PANEL     Status: Abnormal   Collection Time    09/24/12  4:35 AM      Result Value Range   Sodium 138  135 - 145 mEq/L   Potassium 4.2  3.5 - 5.1 mEq/L   Chloride 104  96 - 112  mEq/L   CO2 25  19 - 32 mEq/L   Glucose, Bld 163 (*) 70 - 99 mg/dL   BUN 15  6 - 23 mg/dL   Creatinine, Ser 7.82  0.50 - 1.35 mg/dL   Calcium 8.8  8.4 - 95.6 mg/dL   GFR calc non Af Amer 62 (*) >90 mL/min   GFR calc Af Amer 72 (*) >90 mL/min   Comment:            The eGFR has been calculated     using the CKD EPI equation.     This calculation has not been     validated in all clinical     situations.     eGFR's persistently     <90 mL/min signify     possible Chronic Kidney Disease.  PROTIME-INR     Status: Abnormal   Collection Time    09/24/12  4:35 AM      Result Value Range   Prothrombin Time 27.1 (*) 11.6 - 15.2 seconds   INR 2.67 (*) 0.00 - 1.49  GLUCOSE, CAPILLARY     Status: Abnormal   Collection Time    09/24/12  6:24 AM      Result Value Range   Glucose-Capillary 139 (*) 70 - 99 mg/dL  GLUCOSE, CAPILLARY     Status: Abnormal   Collection Time    09/24/12 11:30 AM      Result Value Range   Glucose-Capillary 123 (*) 70 - 99 mg/dL   Comment 1 Documented in Chart     TSH     Status: None   Collection Time    09/24/12  1:19 PM      Result Value Range   TSH 3.080  0.350 - 4.500 uIU/mL  GLUCOSE, CAPILLARY     Status: Abnormal   Collection Time    09/24/12  4:21 PM      Result Value Range   Glucose-Capillary 113 (*) 70 - 99 mg/dL   Comment 1 Documented in Chart     Comment 2 Notify RN    GLUCOSE, CAPILLARY     Status: Abnormal   Collection Time    09/24/12  8:08 PM      Result Value Range   Glucose-Capillary 128 (*) 70 - 99 mg/dL  GLUCOSE, CAPILLARY     Status: Abnormal   Collection Time    09/24/12  9:47 PM      Result Value Range   Glucose-Capillary 126 (*) 70 - 99 mg/dL   Comment 1 Documented in Chart     Comment 2 Notify RN    GLUCOSE, CAPILLARY     Status: None   Collection Time    09/25/12  6:38 AM      Result Value Range   Glucose-Capillary 98  70 - 99 mg/dL  CBC     Status: Abnormal   Collection Time    09/25/12  6:56 AM      Result Value Range   WBC 5.4  4.0 - 10.5 K/uL   RBC 3.60 (*) 4.22 - 5.81 MIL/uL   Hemoglobin 8.7 (*) 13.0 - 17.0 g/dL   HCT 21.3 (*) 08.6 - 57.8 %   MCV 73.6 (*) 78.0 - 100.0 fL   MCH 24.2 (*) 26.0 - 34.0 pg   MCHC 32.8  30.0 - 36.0 g/dL   RDW 46.9 (*) 62.9 - 52.8 %   Platelets 227  150 - 400 K/uL  BASIC METABOLIC PANEL  Status: Abnormal   Collection Time    09/25/12  6:56 AM      Result Value Range   Sodium 141  135 - 145 mEq/L   Potassium 3.8  3.5 - 5.1 mEq/L   Chloride 106  96 - 112 mEq/L   CO2 27  19 - 32 mEq/L   Glucose, Bld 94  70 - 99 mg/dL   BUN 14  6 - 23 mg/dL   Creatinine, Ser 0.98  0.50 - 1.35 mg/dL   Calcium 9.0  8.4 - 11.9 mg/dL   GFR calc non Af Amer 56 (*) >90 mL/min   GFR calc Af Amer 65 (*) >90 mL/min   Comment:            The eGFR has been calculated     using the CKD EPI equation.     This calculation has not been     validated in all clinical     situations.     eGFR's persistently     <90 mL/min signify     possible Chronic Kidney Disease.  PROTIME-INR      Status: Abnormal   Collection Time    09/25/12  6:56 AM      Result Value Range   Prothrombin Time 27.5 (*) 11.6 - 15.2 seconds   INR 2.72 (*) 0.00 - 1.49    Ct Head Wo Contrast  09/24/2012  *RADIOLOGY REPORT*  Clinical Data: Head trauma secondary to a fall.  Scalp contusion.  CT HEAD WITHOUT CONTRAST  Technique:  Contiguous axial images were obtained from the base of the skull through the vertex without contrast.  Comparison: CT scan dated 09/19/2012  Findings: There is no acute intracranial hemorrhage, infarction, or mass lesion.  There are old lacunar infarcts in the right basal ganglia.  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  Osseous structures are normal.  IMPRESSION: No acute abnormalities.   Original Report Authenticated By: Francene Boyers, M.D.    Mr Brain Wo Contrast  09/24/2012  *RADIOLOGY REPORT*  Clinical Data: Confusion.  Encephalopathy.  MRI HEAD WITHOUT CONTRAST  Technique:  Multiplanar, multiecho pulse sequences of the brain and surrounding structures were obtained according to standard protocol without intravenous contrast.  Comparison: CT 09/19/2012  Findings: Negative for acute infarct.  Generalized atrophy.  Chronic ischemic changes are present in the white matter.  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  Small chronic infarct right posterior cerebellum.  Arachnoid cyst anterior to the left temporal lobe measuring 25 x 30 mm.  Negative for intracranial hemorrhage.  Negative for mass lesion.  IMPRESSION: Chronic ischemic changes.  No acute infarct.   Original Report Authenticated By: Janeece Riggers, M.D.     Positive for sleep disturbance and visual hallucination and delirium Blood pressure 157/74, pulse 100, temperature 97.8 F (36.6 C), temperature source Oral, resp. rate 20, height 5\' 7"  (1.702 m), weight 222 lb 7.1 oz (100.9 kg), SpO2 98.00%.   Assessment/Plan: Altered mental status Delirium with visual hallucination - sees  flies  Recommendation: Will increase seroquel to 50 mg Qhs which may be repeated for insomnia, irritability and agitation. Pt has intact cognition and seems like meet criteria of capacity at this time. Expand database and will follow up as needed. Appreciate psych consult.  Dasja Brase,JANARDHAHA R. 09/25/2012, 12:04 PM

## 2012-09-26 LAB — CBC
MCH: 23.9 pg — ABNORMAL LOW (ref 26.0–34.0)
Platelets: 230 10*3/uL (ref 150–400)
RBC: 3.97 MIL/uL — ABNORMAL LOW (ref 4.22–5.81)
RDW: 18.1 % — ABNORMAL HIGH (ref 11.5–15.5)
WBC: 9.1 10*3/uL (ref 4.0–10.5)

## 2012-09-26 LAB — BASIC METABOLIC PANEL
CO2: 28 mEq/L (ref 19–32)
Calcium: 9.1 mg/dL (ref 8.4–10.5)
Chloride: 105 mEq/L (ref 96–112)
Creatinine, Ser: 1.37 mg/dL — ABNORMAL HIGH (ref 0.50–1.35)
GFR calc Af Amer: 59 mL/min — ABNORMAL LOW (ref >90)
Sodium: 143 mEq/L (ref 135–145)

## 2012-09-26 LAB — GLUCOSE, CAPILLARY: Glucose-Capillary: 166 mg/dL — ABNORMAL HIGH (ref 70–99)

## 2012-09-26 LAB — PROTIME-INR
INR: 2.6 — ABNORMAL HIGH (ref 0.00–1.49)
Prothrombin Time: 26.6 seconds — ABNORMAL HIGH (ref 11.6–15.2)

## 2012-09-26 MED ORDER — METHADONE HCL 10 MG PO TABS
5.0000 mg | ORAL_TABLET | Freq: Every day | ORAL | Status: DC
  Administered 2012-09-26 – 2012-10-05 (×8): 5 mg via ORAL
  Filled 2012-09-26 (×7): qty 1
  Filled 2012-09-26: qty 2

## 2012-09-26 NOTE — Progress Notes (Signed)
[facility] PROGRESS NOTE  DAIL LEREW NGE:[id number] DOB: 06/08/1941 DOA: 09/19/2012 PCP: No primary provider on file.  Assessment/Plan:  BRIEF PATIENT DESCRIPTION:  72 yo male brought to [facility] ED with altered mental status. Intubated for airway protection. Transferred to [facility] for further Tx under PCCM service. Had recent admit for aspiration PNA, A fib with RVR. 4/12 Transfer to [facility]. Went into another episode A fib with RVR on 4/14.  STUDIES:  CT chest 4/12 >> negative for acute bleed  LINES / TUBES:  ETT 4/12 >> 4/13  CULTURES:  Blood 4/12 >> Pending  Urine 4/12 >> No growth  Sputum 4/12 >> Pending  ANTIBIOTICS:  Vancomycin 4/12 >>4/14  Zosyn 4/12 >>4/12  Ceftriaxone 4/14>>>plan stop 4/16    Paroxysmal Atrial fibrillation  Continue amiodarone load. At the time of discharge, would send home on amiodarone 400 mg daily. He will likely need Cardizem CD 360 mg daily. He'll need metoprolol 50 mg by mouth twice a day.  Given poor cognition , not a good candidate for coumadin    AMS he used to be on methadone which was discontinued during last admission, will restart  increase seroquel to 50 mg Qhs which may be repeated for insomnia, irritability and agitation. Pt has intact cognition and seems like meet criteria of capacity at this time.   Acute respiratory failure 2nd to pulmonary edema +/- PNA. Improving.  Hx of OSA.  Extubated, stable oxygen requirements    Creatinine improved 1.48 > 1.27.  S/p nephrectomy.  Creatinine is stable   Hx of Hepatis. Normal liver enzymes  Anemia of chronic disease. Hgb 8.3. INR improving 6.73 >>>3.1 >2.8.  Coagulopathy 2nd to coumadin for A fib.  Monitor INR, may need to be switched to xarelto , if affordable    Recent therapy for aspiration PNA with persistent infiltrate on CXR. Respiratory and blood cultures - Pending  D/ced Vancomycin, zosyn  Continue with ceftriaxone, stop date 4/17,    Diabetes mellitus type 2, continue SSI     Acute encephalopathy - resolved. ? Cause >> may be from opiates, HTN encephalopathy, hypoxia. CT head negative for acute findings , given Persistent confusion, negative MRI of the brain,called for psychiatric consultation, started seroquel  Started B12 as mildly low during previous admit     Code Status: full  Family Communication: family updated about patient's clinical progress  Disposition Plan: As above  Consultants:  Cardiology  Critical care psychiatry      HPI/Subjective: Much more alert today and is now sitting up eating breakfast   Objective: Filed Vitals:   09/25/12 1411 09/25/12 1700 09/25/12 2136 09/26/12 0407  BP: 152/89  136/70 130/58  Pulse: 87 73 74 78  Temp: 98.2 F (36.8 C)  98.2 F (36.8 C) 97.7 F (36.5 C)  TempSrc: Oral  Axillary Axillary  Resp: 20  20 20   Height:      Weight:    101.8 kg (224 lb 6.9 oz)  SpO2: 100%  100% 95%    Intake/Output Summary (Last 24 hours) at 09/26/12 1122 Last data filed at 09/26/12 1007  Gross per 24 hour  Intake   1040 ml  Output    350 ml  Net    690 ml    Exam:  HENT:  Head: Atraumatic.  Nose: Nose normal.  Mouth/Throat: Oropharynx is clear and moist.  Eyes: Conjunctivae are normal. Pupils are equal, round, and reactive to light. No scleral icterus.  Neck: Neck supple. No tracheal deviation present.  Cardiovascular: Normal rate, regular rhythm, normal heart sounds and intact distal pulses.  Pulmonary/Chest: Effort normal and breath sounds normal. No respiratory distress.  Abdominal: Soft. Normal appearance and bowel sounds are normal. She exhibits no distension. There is no tenderness.  Musculoskeletal: She exhibits no edema and no tenderness.  Neurological: She is alert. No cranial nerve deficit.    Data Reviewed: Basic Metabolic Panel:  Recent Labs Lab 09/22/12 0345 09/23/12 0435 09/24/12 0435 09/25/12 0656 09/26/12 0645  NA 139 139 138 141 143  K 3.6 4.7 4.2 3.8 4.0  CL 102 106 104  106 105  CO2 29 26 25 27 28   GLUCOSE 183* 147* 163* 94 108*  BUN 8 12 15 14 17   CREATININE 1.26 1.25 1.17 1.27 1.37*  CALCIUM 8.4 8.7 8.8 9.0 9.1    Liver Function Tests:  Recent Labs Lab 09/19/12 1300 09/20/12 0527  AST 12 11  ALT 16 11  ALKPHOS 108 82  BILITOT 0.4 0.5  PROT 7.9 6.2  ALBUMIN 3.4* 2.6*   No results found for this basename: LIPASE, AMYLASE,  in the last 168 hours No results found for this basename: AMMONIA,  in the last 168 hours  CBC:  Recent Labs Lab 09/19/12 1300  09/22/12 0345 09/23/12 0435 09/24/12 0435 09/25/12 0656 09/26/12 0645  WBC 4.0  < > 5.0 6.4 7.1 5.4 9.1  NEUTROABS 2.8  --   --   --   --   --   --   HGB 9.3*  < > 9.1* 9.2* 9.2* 8.7* 9.5*  HCT 30.0*  < > 28.7* 29.4* 29.1* 26.5* 30.3*  MCV 78.3  < > 74.9* 75.6* 74.6* 73.6* 76.3*  PLT 275  < > 214 234 243 227 230  < > = values in this interval not displayed.  Cardiac Enzymes:  Recent Labs Lab 09/19/12 1300  TROPONINI <0.30   BNP (last 3 results)  Recent Labs  08/21/12 1219 09/19/12 1300 09/20/12 0527  PROBNP 516.5* 681.2* 796.8*     CBG:  Recent Labs Lab 09/25/12 0638 09/25/12 1640 09/25/12 2137 09/26/12 0020 09/26/12 0633  GLUCAP 98 180* 101* 121* 107*    Recent Results (from the past 240 hour(s))  CULTURE, BLOOD (ROUTINE X 2)     Status: None   Collection Time    09/19/12  1:00 PM      Result Value Range Status   Specimen Description BLOOD RIGHT ANTECUBITAL   Final   Special Requests BOTTLES DRAWN AEROBIC AND ANAEROBIC 4CC   Final   Culture NO GROWTH 5 DAYS   Final   Report Status 09/24/2012 FINAL   Final  CULTURE, BLOOD (ROUTINE X 2)     Status: None   Collection Time    09/19/12  1:30 PM      Result Value Range Status   Specimen Description BLOOD RIGHT ANTECUBITAL   Final   Special Requests BOTTLES DRAWN AEROBIC AND ANAEROBIC 4CC   Final   Culture NO GROWTH 5 DAYS   Final   Report Status 09/24/2012 FINAL   Final  URINE CULTURE     Status: None    Collection Time    09/19/12  2:07 PM      Result Value Range Status   Specimen Description URINE, CATHETERIZED   Final   Special Requests NONE   Final   Culture  Setup Time 09/19/2012 20:51   Final   Colony Count NO GROWTH   Final   Culture NO GROWTH  Final   Report Status 09/20/2012 FINAL   Final  MRSA PCR SCREENING     Status: None   Collection Time    09/19/12  5:41 PM      Result Value Range Status   MRSA by PCR NEGATIVE  NEGATIVE Final   Comment:            The GeneXpert MRSA Assay (FDA     approved for NASAL specimens     only), is one component of a     comprehensive MRSA colonization     surveillance program. It is not     intended to diagnose MRSA     infection nor to guide or     monitor treatment for     MRSA infections.     Studies: Dg Chest 2 View  09/04/2012  *RADIOLOGY REPORT*  Clinical Data: Assess infiltrates  CHEST - 2 VIEW  Comparison: 09/03/2012  Findings: Right-sided PICC line is again seen.  Bilateral infiltrates are again noted and stable.  Cardiac shadow is stable.  IMPRESSION: Stable bilateral infiltrative change   Original Report Authenticated By: Alcide Clever, M.D.    Dg Chest 2 View  09/03/2012  *RADIOLOGY REPORT*  Clinical Data: Previously seen infiltrate, check for resolution  CHEST - 2 VIEW  Comparison: 09/01/2012  Findings: The cardiac shadow is again enlarged.  Bilateral infiltrates are again seen and stable.  The right-sided PICC line is again noted.  No new focal abnormality is seen.  IMPRESSION: Stable bilateral infiltrates   Original Report Authenticated By: Alcide Clever, M.D.    Ct Head Wo Contrast  09/24/2012  *RADIOLOGY REPORT*  Clinical Data: Head trauma secondary to a fall.  Scalp contusion.  CT HEAD WITHOUT CONTRAST  Technique:  Contiguous axial images were obtained from the base of the skull through the vertex without contrast.  Comparison: CT scan dated 09/19/2012  Findings: There is no acute intracranial hemorrhage, infarction, or mass  lesion.  There are old lacunar infarcts in the right basal ganglia.  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  Osseous structures are normal.  IMPRESSION: No acute abnormalities.   Original Report Authenticated By: Francene Boyers, M.D.    Ct Head Wo Contrast  09/19/2012  *RADIOLOGY REPORT*  Clinical Data: Altered level of consciousness.  Atrial fibrillation.  Insulin dependent diabetes.  Hypertension.  CT HEAD WITHOUT CONTRAST  Technique:  Contiguous axial images were obtained from the base of the skull through the vertex without contrast.  Comparison: 08/25/2012.  Findings: Mild atrophy and chronic microvascular ischemic change. No acute cortical infarction or hemorrhage.  No mass lesion or hydrocephalus.  Right  thalamic and right putaminal lacunes.  Left middle cranial fossa arachnoid cyst. Vascular calcifications. Nasopharyngeal adenoidal hypertrophy or dependent nasopharyngeal fluid.  Calvarium intact.  No acute sinus or mastoid disease.  IMPRESSION: Chronic changes as described.  No acute intracranial findings. Stable appearance from priors.   Original Report Authenticated By: Davonna Belling, M.D.    Mr Brain Wo Contrast  09/24/2012  *RADIOLOGY REPORT*  Clinical Data: Confusion.  Encephalopathy.  MRI HEAD WITHOUT CONTRAST  Technique:  Multiplanar, multiecho pulse sequences of the brain and surrounding structures were obtained according to standard protocol without intravenous contrast.  Comparison: CT 09/19/2012  Findings: Negative for acute infarct.  Generalized atrophy.  Chronic ischemic changes are present in the white matter.  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  Small chronic infarct right posterior cerebellum.  Arachnoid cyst  anterior to the left temporal lobe measuring 25 x 30 mm.  Negative for intracranial hemorrhage.  Negative for mass lesion.  IMPRESSION: Chronic ischemic changes.  No acute infarct.   Original Report Authenticated By: Janeece Riggers, M.D.     Dg Chest Port 1 View  09/20/2012  *RADIOLOGY REPORT*  Clinical Data: Ventilator dependent respiratory failure.  Follow up CHF.  PORTABLE CHEST - 1 VIEW 09/20/2012 0537 hours:  Comparison: Portable chest x-ray yesterday and 09/01/2012  Findings: Endotracheal tube tip in satisfactory position approximately 5 cm above carina.  Nasogastric tube courses below the diaphragm into the stomach.  Cardiac silhouette enlarged but stable.  Interval slight improvement in the interstitial and airspace pulmonary edema, though moderate edema persist.  New dense atelectasis in both lower lobes.  IMPRESSION: Support apparatus satisfactory.  Slight improvement in CHF, though moderate interstitial and airspace edema persists.  New dense bilateral lower lobe atelectasis.  Cough,   Original Report Authenticated By: Hulan Saas, M.D.    Dg Chest Portable 1 View  09/19/2012  *RADIOLOGY REPORT*  Clinical Data: Shortness of breath and post intubation.  PORTABLE CHEST - 1 VIEW  Comparison: 09/04/2012  Findings: Endotracheal tube is 6.3 cm above the carina. Nasogastric tube extends into the abdomen.  Again noted are diffuse interstitial densities and possibly airspace disease in the right lower lung.  There is a cardiac pad overlying the left upper chest. Stable appearance of the cardiomediastinal silhouette.  IMPRESSION: Support apparatuses as described.  Diffuse interstitial lung densities.  Findings could represent interstitial pulmonary edema but infection cannot be excluded.   Original Report Authenticated By: Richarda Overlie, M.D.    Dg Chest Port 1 View  09/01/2012  *RADIOLOGY REPORT*  Clinical Data: PICC line placement.  PORTABLE CHEST - 1 VIEW  Comparison: Same date.  Findings: Interval placement of right-sided PICC line with distal tip in the expected position of the right atrium.  Stable mild cardiomegaly is noted.  Bilateral airspace opacities are noted concerning for pneumonia or edema.  Bony thorax is intact.  IMPRESSION:  Continued presence of bilateral lung opacities concerning for pneumonia or edema.  Right-sided PICC line has been placed with distal tip in expected position of right atrium; it is recommended to be withdrawn at least 2 cm.   Original Report Authenticated By: Lupita Raider.,  M.D.    [facility] 1 View  09/01/2012  *RADIOLOGY REPORT*  Clinical Data: Pneumonia.  PORTABLE CHEST - 1 VIEW  Comparison: Chest x-ray 08/31/2012.  Findings: Lung volumes are low.  Severe multifocal interstitial and airspace disease is noted throughout all aspects of the lungs bilaterally, increased compared with yesterday's examination. There may be small bilateral pleural effusions, however, the costophrenic sulci are excluded from the lower margin of the image. Cardiomegaly.  Cephalization of the pulmonary vasculature. The patient is rotated to the right on today's exam, resulting in distortion of the mediastinal contours and reduced diagnostic sensitivity and specificity for mediastinal pathology. Atherosclerosis of the thoracic aorta.  IMPRESSION: 1.  Persistent multifocal bilateral interstitial and airspace disease.  Given the cardiomegaly and cephalization of the pulmonary vasculature, findings are favored to reflect severe pulmonary edema related to congestive heart failure, however, superimposed multilobar pneumonia is difficult to exclude.   Original Report Authenticated By: Trudie Reed, M.D.    Dg Chest Port 1 View  08/31/2012  *RADIOLOGY REPORT*  Clinical Data: Pneumonia  PORTABLE CHEST - 1 VIEW  Comparison: 08/28/2012  Findings: Cardiac shadow remains mildly enlarged.  Patchy  bilateral infiltrative changes are again seen.  Given some technical variations of the films there likely is stable.  Abnormality is seen.  IMPRESSION: Stable bilateral infiltrates.   Original Report Authenticated By: Alcide Clever, M.D.    Dg Chest Port 1 View  08/28/2012  *RADIOLOGY REPORT*  Clinical Data: 72 year old male with respiratory  failure.  PORTABLE CHEST - 1 VIEW  Comparison: 08/27/2012 and earlier.  Findings: Portable semi upright AP view 0645 hours.  Interval regression of multilobar are and bilateral (right greater than left) fluffy pulmonary opacity. Stable cardiomegaly and mediastinal contours.  No large effusion.  No pneumothorax.  No areas of worsening ventilation.  IMPRESSION: Interval regressed but not resolved multilobar bilateral pulmonary opacity.  I favor asymmetric pulmonary edema over pneumonia. Cardiomegaly.   Original Report Authenticated By: Erskine Speed, M.D.     Scheduled Meds: . amiodarone  400 mg Oral BID  . antiseptic oral rinse  15 mL Mouth Rinse QID  . chlorhexidine  15 mL Mouth Rinse BID  . diltiazem  90 mg Oral Q6H  . insulin aspart  0-20 Units Subcutaneous TID AC & HS  . metoprolol tartrate  50 mg Oral BID  . pantoprazole  40 mg Oral Daily  . QUEtiapine  50 mg Oral QHS  . warfarin  5 mg Oral q1800  . Warfarin - Pharmacist Dosing Inpatient   Does not apply q1800   Continuous Infusions: . sodium chloride 50 mL/hr at 09/24/12 4098    Active Problems:   * No active hospital problems. *    Time spent: 40 minutes   [facility]  [facility] Pager [phone]. If 8PM-8AM, please contact night-coverage at [email or web address], password [facility] 09/26/2012, 11:22 AM  LOS: 7 days

## 2012-09-26 NOTE — Progress Notes (Signed)
Subjective:  Much more alert today and is now sitting up eating breakfast. No complaints of shortness of breath or chest pain. He knew he was in the hospital but thought he was in [facility].   Objective:  Vital Signs in the last 24 hours: BP 130/58  Pulse 78  Temp(Src) 97.7 F (36.5 C) (Axillary)  Resp 20  Ht 5\' 7"  (1.702 m)  Wt 101.8 kg (224 lb 6.9 oz)  BMI 35.14 kg/m2  SpO2 95%  Physical Exam: Obese white male in no acute distress Lungs:  Clear Cardiac:  Regular rhythm, normal S1 and S2, no S3 Extremities:  No edema present  Intake/Output from previous day: 04/18 0701 - 04/19 0700 In: 1160 [P.O.:1160] Out: 350 [Urine:350] Weight Filed Weights   09/24/12 0308 09/25/12 0515 09/26/12 0407  Weight: 101.1 kg (222 lb 14.2 oz) 100.9 kg (222 lb 7.1 oz) 101.8 kg (224 lb 6.9 oz)    Lab Results: Basic Metabolic Panel:  Recent Labs  16/10/96 0656 09/26/12 0645  NA 141 143  K 3.8 4.0  CL 106 105  CO2 27 28  GLUCOSE 94 108*  BUN 14 17  CREATININE 1.27 1.37*    CBC:  Recent Labs  09/25/12 0656 09/26/12 0645  WBC 5.4 9.1  HGB 8.7* 9.5*  HCT 26.5* 30.3*  MCV 73.6* 76.3*  PLT 227 230    BNP    Component Value Date/Time   PROBNP 796.8* 09/20/2012 0527    PROTIME: Lab Results  Component Value Date   INR 2.60* 09/26/2012   INR 2.72* 09/25/2012   INR 2.67* 09/24/2012    Telemetry: He is currently in sinus rhythm with a left bundle branch block and having less atrial fibrillation then yesterday.  Assessment/Plan: 1. Atrial fibrillation intermittent NSR. 2. LBBB 3. CAD with stent 4. delerium  Rec:  Major issue is disposition. Concerned about anticoagulation in him unless his overall social status and followup can be determined.  Darden Palmer  MD [facility]  09/26/2012, 10:11 AM

## 2012-09-26 NOTE — Progress Notes (Signed)
ANTICOAGULATION CONSULT NOTE - Follow Up Consult  Pharmacy Consult for Coumadin Indication: atrial fibrillation  No Known Allergies  Patient Measurements: Height: 5\' 7"  (170.2 cm) Weight: 224 lb 6.9 oz (101.8 kg) IBW/kg (Calculated) : 66.1  Vital Signs: Temp: 97.7 F (36.5 C) (04/19 0407) Temp src: Axillary (04/19 0407) BP: 130/58 mmHg (04/19 0407) Pulse Rate: 78 (04/19 0407)  Labs:  Recent Labs  09/24/12 0435 09/25/12 0656 09/26/12 0645  HGB 9.2* 8.7* 9.5*  HCT 29.1* 26.5* 30.3*  PLT 243 227 230  LABPROT 27.1* 27.5* 26.6*  INR 2.67* 2.72* 2.60*  CREATININE 1.17 1.27 1.37*    Estimated Creatinine Clearance: 57.9 ml/min (by C-G formula based on Cr of 1.37).   Medications:  Scheduled:  . amiodarone  400 mg Oral BID  . antiseptic oral rinse  15 mL Mouth Rinse QID  . chlorhexidine  15 mL Mouth Rinse BID  . diltiazem  90 mg Oral Q6H  . insulin aspart  0-20 Units Subcutaneous TID AC & HS  . methadone  5 mg Oral Daily  . metoprolol tartrate  50 mg Oral BID  . pantoprazole  40 mg Oral Daily  . QUEtiapine  50 mg Oral QHS  . warfarin  5 mg Oral q1800  . Warfarin - Pharmacist Dosing Inpatient   Does not apply q1800  . [DISCONTINUED] QUEtiapine  25 mg Oral QHS    Assessment: [name] on Coumadin PTA for afib.  Presented with INR>6 (no bleeding) and received 5mg  Vit K.  INR now therapeutic 2.72 and Coumadin restarted 4/14.  Home Coumadin dose of 7.5 mg daily was too high based on presenting INR and new addition of Amiodarone which can also elevate INR.  Coumadin 5mg  daily as maintenance regimen has been holding pt steady, continue to monitor daily INR while in hospital.  Noted increased confusion and concern for discharge home on anticoagulation.  Will continue to follow with you until a decision is made.  Goal of Therapy:  INR 2-3   Plan:  Coumadin 5mg  PO daily. Daily INR.  Leota Sauers Pharm.D. CPP, BCPS Clinical Pharmacist [phone] 09/26/2012 11:45 AM

## 2012-09-27 LAB — CBC
MCH: 24 pg — ABNORMAL LOW (ref 26.0–34.0)
MCHC: 31.4 g/dL (ref 30.0–36.0)
MCV: 76.3 fL — ABNORMAL LOW (ref 78.0–100.0)
Platelets: 219 10*3/uL (ref 150–400)
RDW: 18 % — ABNORMAL HIGH (ref 11.5–15.5)
WBC: 6.5 10*3/uL (ref 4.0–10.5)

## 2012-09-27 LAB — GLUCOSE, CAPILLARY
Glucose-Capillary: 138 mg/dL — ABNORMAL HIGH (ref 70–99)
Glucose-Capillary: 167 mg/dL — ABNORMAL HIGH (ref 70–99)

## 2012-09-27 LAB — BASIC METABOLIC PANEL
BUN: 20 mg/dL (ref 6–23)
Calcium: 8.6 mg/dL (ref 8.4–10.5)
Chloride: 104 mEq/L (ref 96–112)
Creatinine, Ser: 1.44 mg/dL — ABNORMAL HIGH (ref 0.50–1.35)
GFR calc Af Amer: 56 mL/min — ABNORMAL LOW (ref >90)
GFR calc non Af Amer: 48 mL/min — ABNORMAL LOW (ref >90)

## 2012-09-27 LAB — PROTIME-INR: Prothrombin Time: 28.6 seconds — ABNORMAL HIGH (ref 11.6–15.2)

## 2012-09-27 MED ORDER — RIVAROXABAN 20 MG PO TABS
20.0000 mg | ORAL_TABLET | Freq: Every day | ORAL | Status: DC
  Administered 2012-09-27 – 2012-09-28 (×2): 20 mg via ORAL
  Filled 2012-09-27 (×3): qty 1

## 2012-09-27 MED ORDER — SODIUM CHLORIDE 0.9 % IV SOLN
INTRAVENOUS | Status: DC
  Administered 2012-09-27: 75 mL/h via INTRAVENOUS

## 2012-09-27 MED ORDER — DILTIAZEM HCL ER COATED BEADS 360 MG PO CP24
360.0000 mg | ORAL_CAPSULE | Freq: Every day | ORAL | Status: DC
  Administered 2012-09-27 – 2012-10-05 (×9): 360 mg via ORAL
  Filled 2012-09-27 (×9): qty 1

## 2012-09-27 NOTE — Progress Notes (Signed)
Subjective:  Still some intermittent confusion. He is not short of breath and not currently having any chest pain.  Objective:  Vital Signs in the last 24 hours: BP 118/59  Pulse 66  Temp(Src) 98.9 F (37.2 C) (Oral)  Resp 18  Ht 5\' 7"  (1.702 m)  Wt 101.5 kg (223 lb 12.3 oz)  BMI 35.04 kg/m2  SpO2 99%  Physical Exam: Obese white male in no acute distress Lungs:  Clear Cardiac:  Regular rhythm, normal S1 and S2, no S3 Extremities:  No edema present  Intake/Output from previous day: 04/19 0701 - 04/20 0700 In: 840 [P.O.:840] Out: 725 [Urine:725] Weight Filed Weights   09/25/12 0515 09/26/12 0407 09/27/12 0433  Weight: 100.9 kg (222 lb 7.1 oz) 101.8 kg (224 lb 6.9 oz) 101.5 kg (223 lb 12.3 oz)    Lab Results: Basic Metabolic Panel:  Recent Labs  40/98/11 0645 09/27/12 0505  NA 143 139  K 4.0 4.3  CL 105 104  CO2 28 28  GLUCOSE 108* 113*  BUN 17 20  CREATININE 1.37* 1.44*    CBC:  Recent Labs  09/26/12 0645 09/27/12 0505  WBC 9.1 6.5  HGB 9.5* 8.1*  HCT 30.3* 25.8*  MCV 76.3* 76.3*  PLT 230 219    BNP    Component Value Date/Time   PROBNP 796.8* 09/20/2012 0527    PROTIME: Lab Results  Component Value Date   INR 2.87* 09/27/2012   INR 2.60* 09/26/2012   INR 2.72* 09/25/2012    Telemetry: He is currently in sinus rhythm with a left bundle branch block and having less atrial fibrillation then yesterday.  Assessment/Plan: 1. Atrial fibrillation intermittent NSR. 2. LBBB 3. CAD with stent 4. delerium  Rec:  Major issue is disposition. Evidently decision made to place him on XARELTO. This was not unreasonable however his mental status is social situation makes him high risk for complications of anticoagulation.  Darden Palmer  MD [facility]  09/27/2012, 10:53 AM

## 2012-09-27 NOTE — Progress Notes (Signed)
Pt. Noted to be in A-fib from NSR.  Asymptomatic.  V/S stable.  MD notified.

## 2012-09-27 NOTE — Progress Notes (Signed)
[facility] PROGRESS NOTE  [name] RUE:[id number] DOB: [birth year] DOA: 09/19/2012 PCP: No primary provider on file.  Assessment/Plan:  BRIEF PATIENT DESCRIPTION:  72 yo male brought to [facility] ED with altered mental status. Intubated for airway protection. Transferred to [facility] for further Tx under PCCM service. Had recent admit for aspiration PNA, A fib with RVR. 4/12 Transfer to [facility]. Went into another episode A fib with RVR on 4/14.  STUDIES:  CT chest 4/12 >> negative for acute bleed  LINES / TUBES:  ETT 4/12 >> 4/13  CULTURES:  Blood 4/12 >> Pending  Urine 4/12 >> No growth  Sputum 4/12 >> Pending  ANTIBIOTICS:  Vancomycin 4/12 >>4/14  Zosyn 4/12 >>4/12  Ceftriaxone 4/14>>>plan stop 4/16    Paroxysmal Atrial fibrillation  Continue amiodarone load. At the time of discharge, would send home on amiodarone 400 mg daily. He will likely need Cardizem CD 360 mg daily. He'll need metoprolol 50 mg by mouth twice a day.  Given poor cognition , not a good candidate for coumadin ,switch to xarelto, notified Dr Donnie Aho   AMS  he used to be on methadone which was discontinued during last admission, will restart prn as pt apparently was taking it PRN  increase seroquel to 50 mg Qhs which may be repeated for insomnia, irritability and agitation. Pt has intact cognition and seems like meets criteria of capacity at this time.    Acute respiratory failure 2nd to pulmonary edema +/- PNA. Improving.  Hx of OSA.  Extubated, stable oxygen requirements    Creatinine improved 1.48 > 1.27. Trending slightly up again ,start  IVF    S/p nephrectomy.  Creatinine is stable    Hx of Hepatis. Normal liver enzymes  Anemia of chronic disease. Hgb 8.3. INR improving 6.73 >>>3.1 >2.8.  Coagulopathy 2nd to coumadin for A fib.  Monitor INR, may need to be switched to xarelto , if affordable     Recent therapy for aspiration PNA with persistent infiltrate on CXR. Respiratory and blood cultures  - Pending  D/ced Vancomycin, zosyn  Continue with ceftriaxone, stop date 4/17,    Diabetes mellitus type 2, continue SSI     Acute encephalopathy - resolved. ? Cause >> may be from opiates, HTN encephalopathy, hypoxia. CT head negative for acute findings , given Persistent confusion, negative MRI of the brain,called for psychiatric consultation, started seroquel  Started B12 as mildly low during previous admit     Code Status: full  Family Communication: family updated about patient's clinical progress  Disposition Plan: As above  Consultants:  Cardiology  Critical care  psychiatry    HPI/Subjective:  More alert and conversive today  Objective: Filed Vitals:   09/26/12 0407 09/26/12 1415 09/26/12 2030 09/27/12 0433  BP: 130/58 151/60 126/54 118/59  Pulse: 78 84 77 66  Temp: 97.7 F (36.5 C) 98.4 F (36.9 C) 98.8 F (37.1 C) 98.9 F (37.2 C)  TempSrc: Axillary Oral Oral Oral  Resp: 20 20 18 18   Height:      Weight: 101.8 kg (224 lb 6.9 oz)   101.5 kg (223 lb 12.3 oz)  SpO2: 95% 100% 99% 99%    Intake/Output Summary (Last 24 hours) at 09/27/12 1050 Last data filed at 09/27/12 0300  Gross per 24 hour  Intake    480 ml  Output    725 ml  Net   -245 ml    Exam:  HENT:  Head: Atraumatic.  Nose: Nose normal.  Mouth/Throat: Oropharynx  is clear and moist.  Eyes: Conjunctivae are normal. Pupils are equal, round, and reactive to light. No scleral icterus.  Neck: Neck supple. No tracheal deviation present.  Cardiovascular: Normal rate, regular rhythm, normal heart sounds and intact distal pulses.  Pulmonary/Chest: Effort normal and breath sounds normal. No respiratory distress.  Abdominal: Soft. Normal appearance and bowel sounds are normal. She exhibits no distension. There is no tenderness.  Musculoskeletal: She exhibits no edema and no tenderness.  Neurological: She is alert. No cranial nerve deficit.    Data Reviewed: Basic Metabolic Panel:  Recent  Labs Lab 09/23/12 0435 09/24/12 0435 09/25/12 0656 09/26/12 0645 09/27/12 0505  NA 139 138 141 143 139  K 4.7 4.2 3.8 4.0 4.3  CL 106 104 106 105 104  CO2 26 25 27 28 28   GLUCOSE 147* 163* 94 108* 113*  BUN 12 15 14 17 20   CREATININE 1.25 1.17 1.27 1.37* 1.44*  CALCIUM 8.7 8.8 9.0 9.1 8.6    Liver Function Tests: No results found for this basename: AST, ALT, ALKPHOS, BILITOT, PROT, ALBUMIN,  in the last 168 hours No results found for this basename: LIPASE, AMYLASE,  in the last 168 hours No results found for this basename: AMMONIA,  in the last 168 hours  CBC:  Recent Labs Lab 09/23/12 0435 09/24/12 0435 09/25/12 0656 09/26/12 0645 09/27/12 0505  WBC 6.4 7.1 5.4 9.1 6.5  HGB 9.2* 9.2* 8.7* 9.5* 8.1*  HCT 29.4* 29.1* 26.5* 30.3* 25.8*  MCV 75.6* 74.6* 73.6* 76.3* 76.3*  PLT 234 243 227 230 219    Cardiac Enzymes: No results found for this basename: CKTOTAL, CKMB, CKMBINDEX, TROPONINI,  in the last 168 hours BNP (last 3 results)  Recent Labs  08/21/12 1219 09/19/12 1300 09/20/12 0527  PROBNP 516.5* 681.2* 796.8*     CBG:  Recent Labs Lab 09/26/12 0633 09/26/12 1133 09/26/12 1622 09/26/12 2103 09/27/12 0606  GLUCAP 107* 166* 154* 160* 109*    Recent Results (from the past 240 hour(s))  CULTURE, BLOOD (ROUTINE X 2)     Status: None   Collection Time    09/19/12  1:00 PM      Result Value Range Status   Specimen Description BLOOD RIGHT ANTECUBITAL   Final   Special Requests BOTTLES DRAWN AEROBIC AND ANAEROBIC 4CC   Final   Culture NO GROWTH 5 DAYS   Final   Report Status 09/24/2012 FINAL   Final  CULTURE, BLOOD (ROUTINE X 2)     Status: None   Collection Time    09/19/12  1:30 PM      Result Value Range Status   Specimen Description BLOOD RIGHT ANTECUBITAL   Final   Special Requests BOTTLES DRAWN AEROBIC AND ANAEROBIC 4CC   Final   Culture NO GROWTH 5 DAYS   Final   Report Status 09/24/2012 FINAL   Final  URINE CULTURE     Status: None    Collection Time    09/19/12  2:07 PM      Result Value Range Status   Specimen Description URINE, CATHETERIZED   Final   Special Requests NONE   Final   Culture  Setup Time 09/19/2012 20:51   Final   Colony Count NO GROWTH   Final   Culture NO GROWTH   Final   Report Status 09/20/2012 FINAL   Final  MRSA PCR SCREENING     Status: None   Collection Time    09/19/12  5:41 PM  Result Value Range Status   MRSA by PCR NEGATIVE  NEGATIVE Final   Comment:            The GeneXpert MRSA Assay (FDA     approved for NASAL specimens     only), is one component of a     comprehensive MRSA colonization     surveillance program. It is not     intended to diagnose MRSA     infection nor to guide or     monitor treatment for     MRSA infections.     Studies: Dg Chest 2 View  09/04/2012  *RADIOLOGY REPORT*  Clinical Data: Assess infiltrates  CHEST - 2 VIEW  Comparison: 09/03/2012  Findings: Right-sided PICC line is again seen.  Bilateral infiltrates are again noted and stable.  Cardiac shadow is stable.  IMPRESSION: Stable bilateral infiltrative change   Original Report Authenticated By: Alcide Clever, M.D.    Dg Chest 2 View  09/03/2012  *RADIOLOGY REPORT*  Clinical Data: Previously seen infiltrate, check for resolution  CHEST - 2 VIEW  Comparison: 09/01/2012  Findings: The cardiac shadow is again enlarged.  Bilateral infiltrates are again seen and stable.  The right-sided PICC line is again noted.  No new focal abnormality is seen.  IMPRESSION: Stable bilateral infiltrates   Original Report Authenticated By: Alcide Clever, M.D.    Ct Head Wo Contrast  09/24/2012  *RADIOLOGY REPORT*  Clinical Data: Head trauma secondary to a fall.  Scalp contusion.  CT HEAD WITHOUT CONTRAST  Technique:  Contiguous axial images were obtained from the base of the skull through the vertex without contrast.  Comparison: CT scan dated 09/19/2012  Findings: There is no acute intracranial hemorrhage, infarction, or mass  lesion.  There are old lacunar infarcts in the right basal ganglia.  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  Osseous structures are normal.  IMPRESSION: No acute abnormalities.   Original Report Authenticated By: Francene Boyers, M.D.    Ct Head Wo Contrast  09/19/2012  *RADIOLOGY REPORT*  Clinical Data: Altered level of consciousness.  Atrial fibrillation.  Insulin dependent diabetes.  Hypertension.  CT HEAD WITHOUT CONTRAST  Technique:  Contiguous axial images were obtained from the base of the skull through the vertex without contrast.  Comparison: 08/25/2012.  Findings: Mild atrophy and chronic microvascular ischemic change. No acute cortical infarction or hemorrhage.  No mass lesion or hydrocephalus.  Right  thalamic and right putaminal lacunes.  Left middle cranial fossa arachnoid cyst. Vascular calcifications. Nasopharyngeal adenoidal hypertrophy or dependent nasopharyngeal fluid.  Calvarium intact.  No acute sinus or mastoid disease.  IMPRESSION: Chronic changes as described.  No acute intracranial findings. Stable appearance from priors.   Original Report Authenticated By: Davonna Belling, M.D.    Mr Brain Wo Contrast  09/24/2012  *RADIOLOGY REPORT*  Clinical Data: Confusion.  Encephalopathy.  MRI HEAD WITHOUT CONTRAST  Technique:  Multiplanar, multiecho pulse sequences of the brain and surrounding structures were obtained according to standard protocol without intravenous contrast.  Comparison: CT 09/19/2012  Findings: Negative for acute infarct.  Generalized atrophy.  Chronic ischemic changes are present in the white matter.  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  Small chronic infarct right posterior cerebellum.  Arachnoid cyst anterior to the left temporal lobe measuring 25 x 30 mm.  Negative for intracranial hemorrhage.  Negative for mass lesion.  IMPRESSION: Chronic ischemic changes.  No acute infarct.   Original Report Authenticated By:  Janeece Riggers, M.D.     Dg Chest Port 1 View  09/20/2012  *RADIOLOGY REPORT*  Clinical Data: Ventilator dependent respiratory failure.  Follow up CHF.  PORTABLE CHEST - 1 VIEW 09/20/2012 0537 hours:  Comparison: Portable chest x-ray yesterday and 09/01/2012  Findings: Endotracheal tube tip in satisfactory position approximately 5 cm above carina.  Nasogastric tube courses below the diaphragm into the stomach.  Cardiac silhouette enlarged but stable.  Interval slight improvement in the interstitial and airspace pulmonary edema, though moderate edema persist.  New dense atelectasis in both lower lobes.  IMPRESSION: Support apparatus satisfactory.  Slight improvement in CHF, though moderate interstitial and airspace edema persists.  New dense bilateral lower lobe atelectasis.  Cough,   Original Report Authenticated By: Hulan Saas, M.D.    Dg Chest Portable 1 View  09/19/2012  *RADIOLOGY REPORT*  Clinical Data: Shortness of breath and post intubation.  PORTABLE CHEST - 1 VIEW  Comparison: 09/04/2012  Findings: Endotracheal tube is 6.3 cm above the carina. Nasogastric tube extends into the abdomen.  Again noted are diffuse interstitial densities and possibly airspace disease in the right lower lung.  There is a cardiac pad overlying the left upper chest. Stable appearance of the cardiomediastinal silhouette.  IMPRESSION: Support apparatuses as described.  Diffuse interstitial lung densities.  Findings could represent interstitial pulmonary edema but infection cannot be excluded.   Original Report Authenticated By: Richarda Overlie, M.D.    Dg Chest Port 1 View  09/01/2012  *RADIOLOGY REPORT*  Clinical Data: PICC line placement.  PORTABLE CHEST - 1 VIEW  Comparison: Same date.  Findings: Interval placement of right-sided PICC line with distal tip in the expected position of the right atrium.  Stable mild cardiomegaly is noted.  Bilateral airspace opacities are noted concerning for pneumonia or edema.  Bony thorax is intact.  IMPRESSION:  Continued presence of bilateral lung opacities concerning for pneumonia or edema.  Right-sided PICC line has been placed with distal tip in expected position of right atrium; it is recommended to be withdrawn at least 2 cm.   Original Report Authenticated By: Lupita Raider.,  M.D.    [facility] 1 View  09/01/2012  *RADIOLOGY REPORT*  Clinical Data: Pneumonia.  PORTABLE CHEST - 1 VIEW  Comparison: Chest x-ray 08/31/2012.  Findings: Lung volumes are low.  Severe multifocal interstitial and airspace disease is noted throughout all aspects of the lungs bilaterally, increased compared with yesterday's examination. There may be small bilateral pleural effusions, however, the costophrenic sulci are excluded from the lower margin of the image. Cardiomegaly.  Cephalization of the pulmonary vasculature. The patient is rotated to the right on today's exam, resulting in distortion of the mediastinal contours and reduced diagnostic sensitivity and specificity for mediastinal pathology. Atherosclerosis of the thoracic aorta.  IMPRESSION: 1.  Persistent multifocal bilateral interstitial and airspace disease.  Given the cardiomegaly and cephalization of the pulmonary vasculature, findings are favored to reflect severe pulmonary edema related to congestive heart failure, however, superimposed multilobar pneumonia is difficult to exclude.   Original Report Authenticated By: Trudie Reed, M.D.    Dg Chest Port 1 View  08/31/2012  *RADIOLOGY REPORT*  Clinical Data: Pneumonia  PORTABLE CHEST - 1 VIEW  Comparison: 08/28/2012  Findings: Cardiac shadow remains mildly enlarged.  Patchy bilateral infiltrative changes are again seen.  Given some technical variations of the films there likely is stable.  Abnormality is seen.  IMPRESSION: Stable bilateral infiltrates.   Original Report Authenticated By: Alcide Clever, M.D.  Scheduled Meds: . amiodarone  400 mg Oral BID  . antiseptic oral rinse  15 mL Mouth Rinse QID  .  chlorhexidine  15 mL Mouth Rinse BID  . diltiazem  90 mg Oral Q6H  . insulin aspart  0-20 Units Subcutaneous TID AC & HS  . methadone  5 mg Oral Daily  . metoprolol tartrate  50 mg Oral BID  . pantoprazole  40 mg Oral Daily  . QUEtiapine  50 mg Oral QHS  . warfarin  5 mg Oral q1800  . Warfarin - Pharmacist Dosing Inpatient   Does not apply q1800   Continuous Infusions: . sodium chloride 50 mL/hr at 09/24/12 4540    Active Problems:   * No active hospital problems. *    Time spent: 40 minutes   [facility]  [facility] Pager [phone]. If 8PM-8AM, please contact night-coverage at [email or web address], password [facility] 09/27/2012, 10:50 AM  LOS: 8 days

## 2012-09-27 NOTE — Progress Notes (Addendum)
ANTICOAGULATION CONSULT NOTE - Follow Up Consult  Pharmacy Consult for Coumadin Indication: atrial fibrillation  No Known Allergies  Patient Measurements: Height: 5\' 7"  (170.2 cm) Weight: 223 lb 12.3 oz (101.5 kg) IBW/kg (Calculated) : 66.1  Vital Signs: Temp: 98.9 F (37.2 C) (04/20 0433) Temp src: Oral (04/20 0433) BP: 118/59 mmHg (04/20 0433) Pulse Rate: 66 (04/20 0433)  Labs:  Recent Labs  09/25/12 0656 09/26/12 0645 09/27/12 0505  HGB 8.7* 9.5* 8.1*  HCT 26.5* 30.3* 25.8*  PLT 227 230 219  LABPROT 27.5* 26.6* 28.6*  INR 2.72* 2.60* 2.87*  CREATININE 1.27 1.37* 1.44*    Estimated Creatinine Clearance: 55 ml/min (by C-G formula based on Cr of 1.44).   Medications:  Scheduled:  . amiodarone  400 mg Oral BID  . antiseptic oral rinse  15 mL Mouth Rinse QID  . chlorhexidine  15 mL Mouth Rinse BID  . diltiazem  90 mg Oral Q6H  . insulin aspart  0-20 Units Subcutaneous TID AC & HS  . methadone  5 mg Oral Daily  . metoprolol tartrate  50 mg Oral BID  . pantoprazole  40 mg Oral Daily  . QUEtiapine  50 mg Oral QHS  . warfarin  5 mg Oral q1800  . Warfarin - Pharmacist Dosing Inpatient   Does not apply q1800    Assessment: [name] on Coumadin PTA for afib.  Presented with INR>6 (no bleeding) and received 5mg  Vit K.  INR now therapeutic 2.87 and Coumadin restarted 4/14.  Home Coumadin dose of 7.5 mg daily was too high based on presenting INR and new addition of Amiodarone which can also elevate INR.  Coumadin 5mg  daily as maintenance regimen has been holding pt steady, continue to monitor daily INR while in hospital.  Noted increased confusion and concern for discharge home on anticoagulation.  Will continue to follow with you until a decision is made. Possible SNF at d/c Concern for compliance with f/u INR visits - change to Xarelto 20mg  daily as CrCl > 10ml/min Goal of Therapy:     Plan:  Xarelto 20mg  daily with supper - as this increases absorption   Leota Sauers Pharm.D. CPP, BCPS Clinical Pharmacist [phone] 09/27/2012 10:39 AM

## 2012-09-28 LAB — GLUCOSE, CAPILLARY
Glucose-Capillary: 123 mg/dL — ABNORMAL HIGH (ref 70–99)
Glucose-Capillary: 108 mg/dL — ABNORMAL HIGH (ref 70–99)

## 2012-09-28 LAB — BASIC METABOLIC PANEL
CO2: 28 mEq/L (ref 19–32)
Calcium: 8.4 mg/dL (ref 8.4–10.5)
Chloride: 105 mEq/L (ref 96–112)
Creatinine, Ser: 1.35 mg/dL (ref 0.50–1.35)
Glucose, Bld: 105 mg/dL — ABNORMAL HIGH (ref 70–99)

## 2012-09-28 LAB — CBC
Hemoglobin: 7.9 g/dL — ABNORMAL LOW (ref 13.0–17.0)
MCH: 24 pg — ABNORMAL LOW (ref 26.0–34.0)
MCV: 76.6 fL — ABNORMAL LOW (ref 78.0–100.0)
RBC: 3.29 MIL/uL — ABNORMAL LOW (ref 4.22–5.81)
WBC: 6.7 10*3/uL (ref 4.0–10.5)

## 2012-09-28 LAB — RETICULOCYTES: Retic Ct Pct: 1.5 % (ref 0.4–3.1)

## 2012-09-28 NOTE — Progress Notes (Addendum)
[facility] PROGRESS NOTE  [name]:[id number] DOB: [birth year] DOA: 09/19/2012 PCP: No primary provider on file.  Assessment/Plan:  BRIEF PATIENT DESCRIPTION:  72 yo male brought to [facility] ED with altered mental status. Intubated for airway protection. Transferred to [facility] for further Tx under PCCM service. Had recent admit for aspiration PNA, A fib with RVR. 4/12 Transfer to [facility]. Went into another episode A fib with RVR on 4/14.  STUDIES:  CT chest 4/12 >> negative for acute bleed  LINES / TUBES:  ETT 4/12 >> 4/13  CULTURES:  Blood 4/12 >> Pending  Urine 4/12 >> No growth  Sputum 4/12 >> Pending  ANTIBIOTICS:  Vancomycin 4/12 >>4/14  Zosyn 4/12 >>4/12  Ceftriaxone 4/14>>>plan stop 4/16    Paroxysmal Atrial fibrillation  Continue amiodarone load. At the time of discharge, would send home on amiodarone 400 mg daily. He will likely need Cardizem CD 360 mg daily. He'll need metoprolol 50 mg by mouth twice a day.  Given poor cognition , not a good candidate for coumadin ,switch to xarelto,  hematocrit has dropped steadily over the past few days. Anemia needs to be sorted out prior to this patient taking prolonged anticoagulation. 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 Pt has intact cognition and seems like meets criteria of capacity at this time.     Acute respiratory failure 2nd to pulmonary edema +/- PNA. Improving.  Hx of OSA.  Extubated, stable oxygen requirements   Creatinine improved 1.48 > 1.27. Trending slightly up again ,    S/p nephrectomy.  Creatinine fluctuating   Hx of Hepatis. Normal liver enzymes  Anemia of chronic disease. Hgb 8.3. INR improving 6.73 >>>3.1 >2.8.  Coagulopathy 2nd to coumadin for A fib.  Monitor INR, may need to be switched  to xarelto , if affordable    Recent therapy for aspiration PNA with persistent infiltrate on CXR. Respiratory and blood cultures - negative   D/ced Vancomycin, zosyn  Continue with ceftriaxone, stop date 4/17,    Diabetes mellitus type 2, continue SSI    Acute encephalopathy - resolved. ? Cause >> may be from opiates, HTN encephalopathy, hypoxia. CT head negative for acute findings , given Persistent confusion, negative MRI of the brain,called for psychiatric consultation, started seroquel  Started B12 as mildly low during previous admit , continue weekly    Code Status: full  Family Communication: family updated about patient's clinical progress  Disposition Plan: refusing snf, home health when ready  e  Consultants:  Cardiology  Critical care  psychiatry    HPI/Subjective: Intermittently Confused      Objective: Filed Vitals:   09/27/12 1236 09/27/12 1710 09/27/12 1941 09/28/12 0502  BP: 129/61 142/49 132/67 121/61  Pulse: 76 82 88 73  Temp: 98.8 F (37.1 C)  98.4 F (36.9 C) 97.1 F (36.2 C)  TempSrc: Oral  Oral Oral  Resp: 18 22 18 18   Height:      Weight:    101.2 kg (223 lb 1.7 oz)  SpO2: 98% 96% 98% 97%    Intake/Output Summary (Last 24 hours) at 09/28/12 1213 Last data filed at 09/28/12 1036  Gross per 24 hour  Intake  567.5 ml  Output   1550 ml  Net -982.5 ml    Exam:  HENT:  Head: Atraumatic.  Nose: Nose normal.  Mouth/Throat: Oropharynx is clear and moist.  Eyes: Conjunctivae are normal. Pupils are equal, round, and reactive to light. No scleral icterus.  Neck: Neck supple. No tracheal deviation present.  Cardiovascular: Normal rate, regular rhythm, normal heart sounds and intact distal pulses.  Pulmonary/Chest: Effort normal and breath sounds normal. No respiratory distress.  Abdominal: Soft. Normal appearance and bowel sounds are normal. She exhibits no distension. There is no tenderness.  Musculoskeletal: She exhibits no edema and no  tenderness.  Neurological: She is alert. No cranial nerve deficit.    Data Reviewed: Basic Metabolic Panel:  Recent Labs Lab 09/24/12 0435 09/25/12 0656 09/26/12 0645 09/27/12 0505 09/28/12 0530  NA 138 141 143 139 138  K 4.2 3.8 4.0 4.3 4.8  CL 104 106 105 104 105  CO2 25 27 28 28 28   GLUCOSE 163* 94 108* 113* 105*  BUN 15 14 17 20 18   CREATININE 1.17 1.27 1.37* 1.44* 1.35  CALCIUM 8.8 9.0 9.1 8.6 8.4    Liver Function Tests: No results found for this basename: AST, ALT, ALKPHOS, BILITOT, PROT, ALBUMIN,  in the last 168 hours No results found for this basename: LIPASE, AMYLASE,  in the last 168 hours No results found for this basename: AMMONIA,  in the last 168 hours  CBC:  Recent Labs Lab 09/24/12 0435 09/25/12 0656 09/26/12 0645 09/27/12 0505 09/28/12 0530  WBC 7.1 5.4 9.1 6.5 6.7  HGB 9.2* 8.7* 9.5* 8.1* 7.9*  HCT 29.1* 26.5* 30.3* 25.8* 25.2*  MCV 74.6* 73.6* 76.3* 76.3* 76.6*  PLT 243 227 230 219 214    Cardiac Enzymes: No results found for this basename: CKTOTAL, CKMB, CKMBINDEX, TROPONINI,  in the last 168 hours BNP (last 3 results)  Recent Labs  08/21/12 1219 09/19/12 1300 09/20/12 0527  PROBNP 516.5* 681.2* 796.8*     CBG:  Recent Labs Lab 09/27/12 1638 09/27/12 2048 09/27/12 2226 09/28/12 0613 09/28/12 1137  GLUCAP 122* 138* 167* 93 123*    Recent Results (from the past 240 hour(s))  CULTURE, BLOOD (ROUTINE X 2)     Status: None   Collection Time    09/19/12  1:00 PM      Result Value Range Status   Specimen Description BLOOD RIGHT ANTECUBITAL   Final   Special Requests BOTTLES DRAWN AEROBIC AND ANAEROBIC 4CC   Final   Culture NO GROWTH 5 DAYS   Final   Report Status 09/24/2012 FINAL   Final  CULTURE, BLOOD (ROUTINE X 2)     Status: None   Collection Time    09/19/12  1:30 PM      Result Value Range Status   Specimen Description BLOOD RIGHT ANTECUBITAL   Final   Special Requests BOTTLES DRAWN AEROBIC AND ANAEROBIC 4CC    Final   Culture NO GROWTH 5 DAYS   Final   Report Status 09/24/2012 FINAL   Final  URINE CULTURE     Status: None   Collection Time    09/19/12  2:07 PM      Result Value Range Status   Specimen Description URINE, CATHETERIZED   Final   Special Requests NONE   Final   Culture  Setup Time 09/19/2012 20:51   Final   Colony Count NO GROWTH   Final   Culture NO GROWTH   Final   Report Status 09/20/2012 FINAL   Final  MRSA PCR SCREENING     Status: None  Collection Time    09/19/12  5:41 PM      Result Value Range Status   MRSA by PCR NEGATIVE  NEGATIVE Final   Comment:            The GeneXpert MRSA Assay (FDA     approved for NASAL specimens     only), is one component of a     comprehensive MRSA colonization     surveillance program. It is not     intended to diagnose MRSA     infection nor to guide or     monitor treatment for     MRSA infections.     Studies: Dg Chest 2 View  09/04/2012  *RADIOLOGY REPORT*  Clinical Data: Assess infiltrates  CHEST - 2 VIEW  Comparison: 09/03/2012  Findings: Right-sided PICC line is again seen.  Bilateral infiltrates are again noted and stable.  Cardiac shadow is stable.  IMPRESSION: Stable bilateral infiltrative change   Original Report Authenticated By: Alcide Clever, M.D.    Dg Chest 2 View  09/03/2012  *RADIOLOGY REPORT*  Clinical Data: Previously seen infiltrate, check for resolution  CHEST - 2 VIEW  Comparison: 09/01/2012  Findings: The cardiac shadow is again enlarged.  Bilateral infiltrates are again seen and stable.  The right-sided PICC line is again noted.  No new focal abnormality is seen.  IMPRESSION: Stable bilateral infiltrates   Original Report Authenticated By: Alcide Clever, M.D.    Ct Head Wo Contrast  09/24/2012  *RADIOLOGY REPORT*  Clinical Data: Head trauma secondary to a fall.  Scalp contusion.  CT HEAD WITHOUT CONTRAST  Technique:  Contiguous axial images were obtained from the base of the skull through the vertex without  contrast.  Comparison: CT scan dated 09/19/2012  Findings: There is no acute intracranial hemorrhage, infarction, or mass lesion.  There are old lacunar infarcts in the right basal ganglia.  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  Osseous structures are normal.  IMPRESSION: No acute abnormalities.   Original Report Authenticated By: Francene Boyers, M.D.    Ct Head Wo Contrast  09/19/2012  *RADIOLOGY REPORT*  Clinical Data: Altered level of consciousness.  Atrial fibrillation.  Insulin dependent diabetes.  Hypertension.  CT HEAD WITHOUT CONTRAST  Technique:  Contiguous axial images were obtained from the base of the skull through the vertex without contrast.  Comparison: 08/25/2012.  Findings: Mild atrophy and chronic microvascular ischemic change. No acute cortical infarction or hemorrhage.  No mass lesion or hydrocephalus.  Right  thalamic and right putaminal lacunes.  Left middle cranial fossa arachnoid cyst. Vascular calcifications. Nasopharyngeal adenoidal hypertrophy or dependent nasopharyngeal fluid.  Calvarium intact.  No acute sinus or mastoid disease.  IMPRESSION: Chronic changes as described.  No acute intracranial findings. Stable appearance from priors.   Original Report Authenticated By: Davonna Belling, M.D.    Mr Brain Wo Contrast  09/24/2012  *RADIOLOGY REPORT*  Clinical Data: Confusion.  Encephalopathy.  MRI HEAD WITHOUT CONTRAST  Technique:  Multiplanar, multiecho pulse sequences of the brain and surrounding structures were obtained according to standard protocol without intravenous contrast.  Comparison: CT 09/19/2012  Findings: Negative for acute infarct.  Generalized atrophy.  Chronic ischemic changes are present in the white matter.  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  Small chronic infarct right posterior cerebellum.  Arachnoid cyst anterior to the left temporal lobe measuring 25 x 30 mm.  Negative for intracranial hemorrhage.  Negative for mass  lesion.  IMPRESSION: Chronic ischemic changes.  No acute infarct.   Original Report Authenticated By: Janeece Riggers, M.D.    Dg Chest Port 1 View  09/20/2012  *RADIOLOGY REPORT*  Clinical Data: Ventilator dependent respiratory failure.  Follow up CHF.  PORTABLE CHEST - 1 VIEW 09/20/2012 0537 hours:  Comparison: Portable chest x-ray yesterday and 09/01/2012  Findings: Endotracheal tube tip in satisfactory position approximately 5 cm above carina.  Nasogastric tube courses below the diaphragm into the stomach.  Cardiac silhouette enlarged but stable.  Interval slight improvement in the interstitial and airspace pulmonary edema, though moderate edema persist.  New dense atelectasis in both lower lobes.  IMPRESSION: Support apparatus satisfactory.  Slight improvement in CHF, though moderate interstitial and airspace edema persists.  New dense bilateral lower lobe atelectasis.  Cough,   Original Report Authenticated By: Hulan Saas, M.D.    Dg Chest Portable 1 View  09/19/2012  *RADIOLOGY REPORT*  Clinical Data: Shortness of breath and post intubation.  PORTABLE CHEST - 1 VIEW  Comparison: 09/04/2012  Findings: Endotracheal tube is 6.3 cm above the carina. Nasogastric tube extends into the abdomen.  Again noted are diffuse interstitial densities and possibly airspace disease in the right lower lung.  There is a cardiac pad overlying the left upper chest. Stable appearance of the cardiomediastinal silhouette.  IMPRESSION: Support apparatuses as described.  Diffuse interstitial lung densities.  Findings could represent interstitial pulmonary edema but infection cannot be excluded.   Original Report Authenticated By: Richarda Overlie, M.D.    Dg Chest Port 1 View  09/01/2012  *RADIOLOGY REPORT*  Clinical Data: PICC line placement.  PORTABLE CHEST - 1 VIEW  Comparison: Same date.  Findings: Interval placement of right-sided PICC line with distal tip in the expected position of the right atrium.  Stable mild cardiomegaly  is noted.  Bilateral airspace opacities are noted concerning for pneumonia or edema.  Bony thorax is intact.  IMPRESSION: Continued presence of bilateral lung opacities concerning for pneumonia or edema.  Right-sided PICC line has been placed with distal tip in expected position of right atrium; it is recommended to be withdrawn at least 2 cm.   Original Report Authenticated By: Lupita Raider.,  M.D.    [facility] 1 View  09/01/2012  *RADIOLOGY REPORT*  Clinical Data: Pneumonia.  PORTABLE CHEST - 1 VIEW  Comparison: Chest x-ray 08/31/2012.  Findings: Lung volumes are low.  Severe multifocal interstitial and airspace disease is noted throughout all aspects of the lungs bilaterally, increased compared with yesterday's examination. There may be small bilateral pleural effusions, however, the costophrenic sulci are excluded from the lower margin of the image. Cardiomegaly.  Cephalization of the pulmonary vasculature. The patient is rotated to the right on today's exam, resulting in distortion of the mediastinal contours and reduced diagnostic sensitivity and specificity for mediastinal pathology. Atherosclerosis of the thoracic aorta.  IMPRESSION: 1.  Persistent multifocal bilateral interstitial and airspace disease.  Given the cardiomegaly and cephalization of the pulmonary vasculature, findings are favored to reflect severe pulmonary edema related to congestive heart failure, however, superimposed multilobar pneumonia is difficult to exclude.   Original Report Authenticated By: Trudie Reed, M.D.    Dg Chest Port 1 View  08/31/2012  *RADIOLOGY REPORT*  Clinical Data: Pneumonia  PORTABLE CHEST - 1 VIEW  Comparison: 08/28/2012  Findings: Cardiac shadow remains mildly enlarged.  Patchy bilateral infiltrative changes are again seen.  Given some technical variations of the films there likely is stable.  Abnormality is seen.  IMPRESSION: Stable bilateral infiltrates.   Original Report Authenticated By: Alcide Clever, M.D.     Scheduled Meds: . amiodarone  400 mg Oral BID  . antiseptic oral rinse  15 mL Mouth Rinse QID  . chlorhexidine  15 mL Mouth Rinse BID  . diltiazem  360 mg Oral Daily  . insulin aspart  0-20 Units Subcutaneous TID AC & HS  . methadone  5 mg Oral Daily  . metoprolol tartrate  50 mg Oral BID  . pantoprazole  40 mg Oral Daily  . QUEtiapine  50 mg Oral QHS  . rivaroxaban  20 mg Oral Q supper   Continuous Infusions:   Active Problems:   * No active hospital problems. *    Time spent: 40 minutes   [facility]  [facility] Pager [phone]. If 8PM-8AM, please contact night-coverage at [email or web address], password [facility] 09/28/2012, 12:13 PM  LOS: 9 days

## 2012-09-28 NOTE — Progress Notes (Signed)
Clinical Child psychotherapist (CSW) left a message for pt friend to provide bed offers. CSW also placed pt FL2 and 30 day note on pt shadow chart.   Theresia Bough, MSW, Theresia Majors [phone]

## 2012-09-28 NOTE — Progress Notes (Signed)
Clinical Social Worker (CSW) spoke with pt friend Mr. Irving Burton to provide bed offers as pt is ready for dc today. Mr. Irving Burton informed CSW that he will be taking pt home at dc. Mr. Irving Burton stated that pt is "horrified" of nursing homes and that he would like to follow pt wishes and take him home. CSW informed Mr. Irving Burton of concerns associated with taking pt home, he stated he understood and would care for pt along with [facility] services. CSW informed RNCM of pt dc'ing home and need for [facility]. This CSW signing off.  Theresia Bough, MSW, Theresia Majors [phone]

## 2012-09-28 NOTE — Progress Notes (Signed)
SUBJECTIVE:  Intermittently Confused.  Intermittent atrial fibrillation.  Now there is controlled response and no symptoms of palpitations per the nurses note.  Currently, Maintaining normal sinus rhythm on amiodarone, Cardizem, metoprolol.   OBJECTIVE:   Vitals:   Filed Vitals:   09/27/12 1236 09/27/12 1710 09/27/12 1941 09/28/12 0502  BP: 129/61 142/49 132/67 121/61  Pulse: 76 82 88 73  Temp: 98.8 F (37.1 C)  98.4 F (36.9 C) 97.1 F (36.2 C)  TempSrc: Oral  Oral Oral  Resp: 18 22 18 18   Height:      Weight:    101.2 kg (223 lb 1.7 oz)  SpO2: 98% 96% 98% 97%   I&O's:    Intake/Output Summary (Last 24 hours) at 09/28/12 1026 Last data filed at 09/28/12 0500  Gross per 24 hour  Intake  567.5 ml  Output   1150 ml  Net -582.5 ml   TELEMETRY: Reviewed telemetry pt in normal sinus rhythm with bundle branch block:     PHYSICAL EXAM General: Well developed, well nourished, sweating Head: Normal cephalic and atramatic  Lungs:   Clear bilaterally to auscultation and percussion. Heart:  HRRR S1 S2  Abdomen: Bowel sounds are positive, abdomen soft and non-tender Msk:  . Normal strength and tone for age. Extremities:   No  edema.   Neuro: Alert and oriented X 3. Psych:  Normal affect, responds appropriately   LABS: Basic Metabolic Panel:  Recent Labs  16/10/96 0505 09/28/12 0530  NA 139 138  K 4.3 4.8  CL 104 105  CO2 28 28  GLUCOSE 113* 105*  BUN 20 18  CREATININE 1.44* 1.35  CALCIUM 8.6 8.4   Liver Function Tests: No results found for this basename: AST, ALT, ALKPHOS, BILITOT, PROT, ALBUMIN,  in the last 72 hours No results found for this basename: LIPASE, AMYLASE,  in the last 72 hours CBC:  Recent Labs  09/27/12 0505 09/28/12 0530  WBC 6.5 6.7  HGB 8.1* 7.9*  HCT 25.8* 25.2*  MCV 76.3* 76.6*  PLT 219 214   Cardiac Enzymes: No results found for this basename: CKTOTAL, CKMB, CKMBINDEX, TROPONINI,  in the last 72 hours BNP: No components found with  this basename: POCBNP,  D-Dimer: No results found for this basename: DDIMER,  in the last 72 hours Hemoglobin A1C: No results found for this basename: HGBA1C,  in the last 72 hours Fasting Lipid Panel: No results found for this basename: CHOL, HDL, LDLCALC, TRIG, CHOLHDL, LDLDIRECT,  in the last 72 hours Thyroid Function Tests: No results found for this basename: TSH, T4TOTAL, FREET3, T3FREE, THYROIDAB,  in the last 72 hours Anemia Panel: No results found for this basename: VITAMINB12, FOLATE, FERRITIN, TIBC, IRON, RETICCTPCT,  in the last 72 hours Coag Panel:   Lab Results  Component Value Date   INR 2.87* 09/27/2012   INR 2.60* 09/26/2012   INR 2.72* 09/25/2012    09/19/2012  *RADIOLOGY REPORT*  Clinical Data: Altered level of consciousness.  Atrial fibrillation.  Insulin dependent diabetes.  Hypertension.  CT HEAD WITHOUT CONTRAST  Technique:  Contiguous axial images were obtained from the base of the skull through the vertex without contrast.  Comparison: 08/25/2012.  Findings: Mild atrophy and chronic microvascular ischemic change. No acute cortical infarction or hemorrhage.  No mass lesion or hydrocephalus.  Right  thalamic and right putaminal lacunes.  Left middle cranial fossa arachnoid cyst. Vascular calcifications. Nasopharyngeal adenoidal hypertrophy or dependent nasopharyngeal fluid.  Calvarium intact.  No acute sinus or  mastoid disease.  IMPRESSION: Chronic changes as described.  No acute intracranial findings. Stable appearance from priors.   Original Report Authenticated By: Davonna Belling, M.D.       ASSESSMENT: Atrial fibrillation, coronary artery disease  PLAN:  Atrial fibrillation:It appears he will need an antiarrhythmic to maintain sinus rhythm.  He previously felt when he went out of rhythm.  He stated this has happened many times over the past year.  Continue amiodarone load.  At the time of discharge, would send home on amiodarone 400 mg daily.  He will likely need Cardizem  CD 360 mg daily.  He'll need metoprolol 50 mg by mouth twice a day.  Anticoagulation: When I saw him in February of 2013, he was on aspirin and Plavix due to a drug-eluting stent that was placed a few years ago.  We did not put him on Coumadin. I was okay with him using warfarin if his mental status improved.  However, now he has a significant anemia.  Unclear what the source of this is.  Xarelto was started, but his hematocrit has dropped steadily over the past few days.  Anemia needs to be sorted out prior to this patient taking prolonged anticoagulation.    He previously was on atenolol.  He brought this medication to the hospital to show [place]. This apparently has been switched to metoprolol as an outpatient.  Either medication would be fine.     His confusion is definitely different from the previous few times that I've seen him. His mental status will have to be cleared up before I would send him home on anticoagulation.  Psych consult reveals that he has intact cognition.  Corky Crafts., MD  09/28/2012  10:26 AM

## 2012-09-29 DIAGNOSIS — J810 Acute pulmonary edema: Secondary | ICD-10-CM | POA: Diagnosis present

## 2012-09-29 LAB — IRON AND TIBC
Saturation Ratios: 4 % — ABNORMAL LOW (ref 20–55)
TIBC: 321 ug/dL (ref 215–435)

## 2012-09-29 LAB — FOLATE: Folate: 10.8 ng/mL

## 2012-09-29 LAB — CBC
MCH: 23.9 pg — ABNORMAL LOW (ref 26.0–34.0)
MCV: 75.5 fL — ABNORMAL LOW (ref 78.0–100.0)
Platelets: 206 10*3/uL (ref 150–400)
RBC: 3.14 MIL/uL — ABNORMAL LOW (ref 4.22–5.81)
RDW: 17.6 % — ABNORMAL HIGH (ref 11.5–15.5)
WBC: 5.5 10*3/uL (ref 4.0–10.5)

## 2012-09-29 LAB — GLUCOSE, CAPILLARY
Glucose-Capillary: 86 mg/dL (ref 70–99)
Glucose-Capillary: 139 mg/dL — ABNORMAL HIGH (ref 70–99)
Glucose-Capillary: 134 mg/dL — ABNORMAL HIGH (ref 70–99)

## 2012-09-29 LAB — PROTIME-INR: INR: 3.47 — ABNORMAL HIGH (ref 0.00–1.49)

## 2012-09-29 LAB — VITAMIN B12: Vitamin B-12: 685 pg/mL (ref 211–911)

## 2012-09-29 MED ORDER — POLYETHYLENE GLYCOL 3350 17 G PO PACK
17.0000 g | PACK | Freq: Every day | ORAL | Status: DC
  Administered 2012-09-29 – 2012-10-04 (×4): 17 g via ORAL
  Filled 2012-09-29 (×7): qty 1

## 2012-09-29 NOTE — Progress Notes (Signed)
PT Cancellation Note  Patient Details Name: [name] MRN: [id number] DOB: 1940/06/20   Cancelled Treatment:    Reason Eval/Treat Not Completed: Fatigue/lethargy limiting ability to participate.  Pt states he just got back from bathroom and needs to rest.  Declining any mobility at this time.     [name], [place] [phone] 09/29/2012, 8:54 AM

## 2012-09-29 NOTE — Progress Notes (Signed)
Occupational Therapy Treatment Patient Details Name: [name] MRN: [id number] DOB: 03/14/1941 Today's Date: 09/29/2012 Time: 4098-1191 OT Time Calculation (min): 23 min  OT Assessment / Plan / Recommendation Comments on Treatment Session Pt seems improved from a cognitive standpoint today.  Friend to take home which should be fine AS LONG AS 24 HR S IS PROVIDED.    Follow Up Recommendations  Home health OT    Barriers to Discharge       Equipment Recommendations  None recommended by OT    Recommendations for Other Services    Frequency Min 2X/week   Plan Discharge plan needs to be updated    Precautions / Restrictions Precautions Precautions: Fall Restrictions Weight Bearing Restrictions: No   Pertinent Vitals/Pain Pt with no reports of pain.    ADL  Grooming: Performed;Wash/dry hands;Wash/dry face;Teeth care;Supervision/safety Where Assessed - Grooming: Unsupported standing Upper Body Bathing: Simulated;Set up Where Assessed - Upper Body Bathing: Unsupported sitting Lower Body Bathing: Performed;Set up Where Assessed - Lower Body Bathing: Unsupported sit to stand Upper Body Dressing: Simulated;Set up Where Assessed - Upper Body Dressing: Unsupported sitting Lower Body Dressing: Performed;Set up Where Assessed - Lower Body Dressing: Unsupported sit to stand Toilet Transfer: Performed;Supervision/safety Toilet Transfer Method: Sit to Barista: Raised toilet seat with arms (or 3-in-1 over toilet);Grab bars Toileting - Clothing Manipulation and Hygiene: Performed;Supervision/safety Where Assessed - Toileting Clothing Manipulation and Hygiene: Standing Transfers/Ambulation Related to ADLs: Pt walked in room with S and no LOB without assistive device. ADL Comments: Pt with better balance today reaching feet w/o assist.    OT Diagnosis:    OT Problem List:   OT Treatment Interventions:     OT Goals Acute Rehab OT Goals OT Goal Formulation:  With patient Time For Goal Achievement: 10/08/12 Potential to Achieve Goals: Good ADL Goals Pt Will Perform Grooming: with modified independence;Standing at sink ADL Goal: Grooming - Progress: Progressing toward goals Pt Will Perform Upper Body Bathing: with modified independence;Sit to stand from chair ADL Goal: Upper Body Bathing - Progress: Progressing toward goals Pt Will Perform Lower Body Bathing: with modified independence;Sit to stand from chair ADL Goal: Lower Body Bathing - Progress: Progressing toward goals Pt Will Perform Upper Body Dressing: with modified independence;Sitting, chair ADL Goal: Upper Body Dressing - Progress: Progressing toward goals Pt Will Perform Lower Body Dressing: with modified independence;Sit to stand from chair ADL Goal: Lower Body Dressing - Progress: Progressing toward goals Pt Will Transfer to Toilet: with modified independence ADL Goal: Toilet Transfer - Progress: Progressing toward goals Pt Will Perform Toileting - Clothing Manipulation: with modified independence ADL Goal: Toileting - Clothing Manipulation - Progress: Progressing toward goals Pt Will Perform Toileting - Hygiene: with modified independence ADL Goal: Toileting - Hygiene - Progress: Progressing toward goals  Visit Information  Last OT Received On: 09/29/12 Assistance Needed: +1    Subjective Data      Prior Functioning       Cognition  Cognition Arousal/Alertness: Awake/alert Behavior During Therapy: Impulsive Overall Cognitive Status: Within Functional Limits for tasks assessed Orientation Level:  (intact today.) General Comments: Pt's cognition seemed improved today compared to chart reports of previous days.  Pt was oriented and answered all safety questions Ily.  Pt was aware that sometimes he does hallucinate but was not during this session.     Mobility  Bed Mobility Bed Mobility: Supine to Sit;Sitting - Scoot to Edge of Bed Supine to Sit: 5: Supervision Sitting  - Scoot to  Edge of Bed: 5: Supervision Sit to Supine: 5: Supervision Details for Bed Mobility Assistance: Needed use of rails for safety, no assist needed Transfers Transfers: Sit to Stand;Stand to Sit Sit to Stand: 5: Supervision Stand to Sit: 5: Supervision Details for Transfer Assistance: S since this was first time with this patient.    Exercises      Balance Balance Balance Assessed: Yes Static Standing Balance Static Standing - Balance Support: During functional activity;Left upper extremity supported;Right upper extremity supported Static Standing - Level of Assistance: 5: Stand by assistance Static Standing - Comment/# of Minutes: 5   End of Session OT - End of Session Activity Tolerance: Patient tolerated treatment well;Patient limited by fatigue Patient left: in bed;with call bell/phone within reach Nurse Communication: Mobility status  GO     [name] 09/29/2012, 10:30 AM [phone]

## 2012-09-29 NOTE — Progress Notes (Signed)
[facility] PROGRESS NOTE  RUHAAN NORDAHL ZOX:[id number] DOB: [birth year] DOA: 09/19/2012 PCP: No primary provider on file.  Brief narrative: PHINNEAS SHAKOOR is an 72 y.o. male with a past medical history of recent aspiration pneumonia, atrial fibrillation with rapid ventricular response, COPD, and diabetes who initially presented to [facility] on 09/19/2012 with altered mental status requiring intubation for airway protection. He subsequently was transferred to [facility] under the care of the critical care service. He was successfully extubated on 09/20/2012 after being pan cultured and treated with broad-spectrum antibiotics.  Assessment/Plan: Principal Problem:   Acute respiratory failure secondary to pulmonary edema and community-acquired pneumonia / history of obstructive sleep apnea and COPD -Intubated 4/12-4/13. -Continue CPAP each bedtime. Continue albuterol as needed. -Continue oxygen to keep sats greater than 92%. -Finished a course of empiric antibiotics which were narrowed to Rocephin. Initially on vancomycin/Zosyn. -Diuresis as tolerated. Active Problems:   Atrial fibrillation with rapid ventricular response -Cardiology following and adjusting rate/rhythm control medications. -Continue amiodarone , Cardizem, and metoprolol. -Anticoagulation on hold while evaluating source of anemia.   CAD (coronary artery disease), native coronary artery -No evidence of acute coronary syndrome.   IDDM (insulin dependent diabetes mellitus) -Continue SSI.   Hypertension associated with diabetes / hypertensive emergency with acute pulmonary edema -Blood pressure currently controlled on antihypertensives.   HCAP (healthcare-associated pneumonia) -Initially treated with broad-spectrum antibiotics, antibiotics subsequently narrowed to Rocephin and discontinued.   COPD (chronic obstructive pulmonary disease) -Respiratory status stable. Continue albuterol when necessary.   Coagulopathy -Secondary to  Coumadin for chronic atrial fibrillation.   History of hepatitis -No elevation of LFTs noted.    Status post nephrectomy   Acute encephalopathy -Continue to monitor his mental status. -Status post psychiatric evaluation on 09/25/2012: Delirium with visual hallucinations. 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 Monitor. -CT of the head was negative for acute findings. MRI showed chronic ischemic changes. -Felt to have capacity for psychiatric evaluation.   Acute kidney injury -Creatinine improved with hydration.   Normocytic anemia -Hemoglobin has dropped steadily over the course of his hospital stay. -B12 685, folic acid 10.8, ferritin 48, iron low at 13 with a total iron binding capacity of 321 and a urine iron binding capacity of 308. Reticulocyte count is low for degree of anemia. These findings are most suggestive of anemia of chronic disease. -Continue B12 supplementation therapy. -Followup fecal occult blood testing. -Continue PPI therapy.  Code Status: Full. Family Communication: No family at bedside today. Disposition Plan: Home when stable.   Medical Consultants:  Dr. Lance Muss, Cardiology  Dr. Darrol Jump, Psychiatry  Dr. Rory Percy, PCCM  Other Consultants:  Occupational therapy: Home OT  Anti-infectives:  Vancomycin 4/12 >>4/14   Zosyn 4/12 >>4/12   Ceftriaxone 4/14>>>4/16    HPI/Subjective: Jess Barters is without complaint today. He denies chest pain, dyspnea, cough. Bowels are moving. Appetite is good. Feels very close to his baseline level of functioning.  Objective: Filed Vitals:   09/28/12 1335 09/28/12 2029 09/29/12 0507 09/29/12 1052  BP: 127/67 126/63 123/62 144/62  Pulse: 69 77 72 85  Temp: 98.9 F (37.2 C) 98.1 F (36.7 C) 98 F (36.7 C)   TempSrc: Oral Oral Oral   Resp: 20 18 19    Height:      Weight:   103.1 kg (227  lb 4.7 oz)   SpO2: 94% 92% 92%     Intake/Output  Summary (Last 24 hours) at 09/29/12 1108 Last data filed at 09/29/12 0820  Gross per 24 hour  Intake    840 ml  Output   1476 ml  Net   -636 ml    Exam: Gen:  NAD Cardiovascular:  RRR, No M/R/G Respiratory:  Lungs CTAB Gastrointestinal:  Abdomen soft, NT/ND, + BS Extremities:  Trace edema  Data Reviewed: Basic Metabolic Panel:  Recent Labs Lab 09/24/12 0435 09/25/12 0656 09/26/12 0645 09/27/12 0505 09/28/12 0530  NA 138 141 143 139 138  K 4.2 3.8 4.0 4.3 4.8  CL 104 106 105 104 105  CO2 25 27 28 28 28   GLUCOSE 163* 94 108* 113* 105*  BUN 15 14 17 20 18   CREATININE 1.17 1.27 1.37* 1.44* 1.35  CALCIUM 8.8 9.0 9.1 8.6 8.4   GFR Estimated Creatinine Clearance: 59.1 ml/min (by C-G formula based on Cr of 1.35).  Coagulation profile  Recent Labs Lab 09/24/12 0435 09/25/12 0656 09/26/12 0645 09/27/12 0505 09/29/12 1006  INR 2.67* 2.72* 2.60* 2.87* 3.47*    CBC:  Recent Labs Lab 09/25/12 0656 09/26/12 0645 09/27/12 0505 09/28/12 0530 09/29/12 0515  WBC 5.4 9.1 6.5 6.7 5.5  HGB 8.7* 9.5* 8.1* 7.9* 7.5*  HCT 26.5* 30.3* 25.8* 25.2* 23.7*  MCV 73.6* 76.3* 76.3* 76.6* 75.5*  PLT 227 230 219 214 206   BNP (last 3 results)  Recent Labs  08/21/12 1219 09/19/12 1300 09/20/12 0527  PROBNP 516.5* 681.2* 796.8*   CBG:  Recent Labs Lab 09/28/12 0613 09/28/12 1137 09/28/12 1643 09/28/12 2116 09/29/12 0611  GLUCAP 93 123* 108* 148* 86   Anemia work up  Recent Labs  09/28/12 2225  VITAMINB12 685  FOLATE 10.8  FERRITIN 48  TIBC 321  IRON 13*  RETICCTPCT 1.5   Microbiology Recent Results (from the past 240 hour(s))  CULTURE, BLOOD (ROUTINE X 2)     Status: None   Collection Time    09/19/12  1:00 PM      Result Value Range Status   Specimen Description BLOOD RIGHT ANTECUBITAL   Final   Special Requests BOTTLES DRAWN AEROBIC AND ANAEROBIC 4CC   Final   Culture NO GROWTH 5 DAYS   Final    Report Status 09/24/2012 FINAL   Final  CULTURE, BLOOD (ROUTINE X 2)     Status: None   Collection Time    09/19/12  1:30 PM      Result Value Range Status   Specimen Description BLOOD RIGHT ANTECUBITAL   Final   Special Requests BOTTLES DRAWN AEROBIC AND ANAEROBIC 4CC   Final   Culture NO GROWTH 5 DAYS   Final   Report Status 09/24/2012 FINAL   Final  URINE CULTURE     Status: None   Collection Time    09/19/12  2:07 PM      Result Value Range Status   Specimen Description URINE, CATHETERIZED   Final   Special Requests NONE   Final   Culture  Setup Time 09/19/2012 20:51   Final   Colony Count NO GROWTH   Final   Culture NO GROWTH   Final   Report Status 09/20/2012 FINAL   Final  MRSA PCR SCREENING     Status: None   Collection Time    09/19/12  5:41 PM      Result Value Range Status   MRSA by PCR NEGATIVE  NEGATIVE Final   Comment:  The GeneXpert MRSA Assay (FDA     approved for NASAL specimens     only), is one component of a     comprehensive MRSA colonization     surveillance program. It is not     intended to diagnose MRSA     infection nor to guide or     monitor treatment for     MRSA infections.     Procedures and Diagnostic Studies: Dg Chest 2 View  09/04/2012  *RADIOLOGY REPORT*  Clinical Data: Assess infiltrates  CHEST - 2 VIEW  Comparison: 09/03/2012  Findings: Right-sided PICC line is again seen.  Bilateral infiltrates are again noted and stable.  Cardiac shadow is stable.  IMPRESSION: Stable bilateral infiltrative change   Original Report Authenticated By: Alcide Clever, M.D.    Dg Chest 2 View  09/03/2012  *RADIOLOGY REPORT*  Clinical Data: Previously seen infiltrate, check for resolution  CHEST - 2 VIEW  Comparison: 09/01/2012  Findings: The cardiac shadow is again enlarged.  Bilateral infiltrates are again seen and stable.  The right-sided PICC line is again noted.  No new focal abnormality is seen.  IMPRESSION: Stable bilateral infiltrates    Original Report Authenticated By: Alcide Clever, M.D.    Ct Head Wo Contrast  09/24/2012  *RADIOLOGY REPORT*  Clinical Data: Head trauma secondary to a fall.  Scalp contusion.  CT HEAD WITHOUT CONTRAST  Technique:  Contiguous axial images were obtained from the base of the skull through the vertex without contrast.  Comparison: CT scan dated 09/19/2012  Findings: There is no acute intracranial hemorrhage, infarction, or mass lesion.  There are old lacunar infarcts in the right basal ganglia.  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  Osseous structures are normal.  IMPRESSION: No acute abnormalities.   Original Report Authenticated By: Francene Boyers, M.D.    Ct Head Wo Contrast  09/19/2012  *RADIOLOGY REPORT*  Clinical Data: Altered level of consciousness.  Atrial fibrillation.  Insulin dependent diabetes.  Hypertension.  CT HEAD WITHOUT CONTRAST  Technique:  Contiguous axial images were obtained from the base of the skull through the vertex without contrast.  Comparison: 08/25/2012.  Findings: Mild atrophy and chronic microvascular ischemic change. No acute cortical infarction or hemorrhage.  No mass lesion or hydrocephalus.  Right  thalamic and right putaminal lacunes.  Left middle cranial fossa arachnoid cyst. Vascular calcifications. Nasopharyngeal adenoidal hypertrophy or dependent nasopharyngeal fluid.  Calvarium intact.  No acute sinus or mastoid disease.  IMPRESSION: Chronic changes as described.  No acute intracranial findings. Stable appearance from priors.   Original Report Authenticated By: Davonna Belling, M.D.    Mr Brain Wo Contrast  09/24/2012  *RADIOLOGY REPORT*  Clinical Data: Confusion.  Encephalopathy.  MRI HEAD WITHOUT CONTRAST  Technique:  Multiplanar, multiecho pulse sequences of the brain and surrounding structures were obtained according to standard protocol without intravenous contrast.  Comparison: CT 09/19/2012  Findings: Negative for acute infarct.  Generalized  atrophy.  Chronic ischemic changes are present in the white matter.  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  Small chronic infarct right posterior cerebellum.  Arachnoid cyst anterior to the left temporal lobe measuring 25 x 30 mm.  Negative for intracranial hemorrhage.  Negative for mass lesion.  IMPRESSION: Chronic ischemic changes.  No acute infarct.   Original Report Authenticated By: Janeece Riggers, M.D.    Dg Chest Port 1 View  09/20/2012  *RADIOLOGY REPORT*  Clinical Data: Ventilator dependent respiratory failure.  Follow up CHF.  PORTABLE CHEST - 1 VIEW 09/20/2012 0537 hours:  Comparison: Portable chest x-ray yesterday and 09/01/2012  Findings: Endotracheal tube tip in satisfactory position approximately 5 cm above carina.  Nasogastric tube courses below the diaphragm into the stomach.  Cardiac silhouette enlarged but stable.  Interval slight improvement in the interstitial and airspace pulmonary edema, though moderate edema persist.  New dense atelectasis in both lower lobes.  IMPRESSION: Support apparatus satisfactory.  Slight improvement in CHF, though moderate interstitial and airspace edema persists.  New dense bilateral lower lobe atelectasis.  Cough,   Original Report Authenticated By: Hulan Saas, M.D.    Dg Chest Portable 1 View  09/19/2012  *RADIOLOGY REPORT*  Clinical Data: Shortness of breath and post intubation.  PORTABLE CHEST - 1 VIEW  Comparison: 09/04/2012  Findings: Endotracheal tube is 6.3 cm above the carina. Nasogastric tube extends into the abdomen.  Again noted are diffuse interstitial densities and possibly airspace disease in the right lower lung.  There is a cardiac pad overlying the left upper chest. Stable appearance of the cardiomediastinal silhouette.  IMPRESSION: Support apparatuses as described.  Diffuse interstitial lung densities.  Findings could represent interstitial pulmonary edema but infection cannot be excluded.   Original Report Authenticated  By: Richarda Overlie, M.D.    Dg Chest Port 1 View  09/01/2012  *RADIOLOGY REPORT*  Clinical Data: PICC line placement.  PORTABLE CHEST - 1 VIEW  Comparison: Same date.  Findings: Interval placement of right-sided PICC line with distal tip in the expected position of the right atrium.  Stable mild cardiomegaly is noted.  Bilateral airspace opacities are noted concerning for pneumonia or edema.  Bony thorax is intact.  IMPRESSION: Continued presence of bilateral lung opacities concerning for pneumonia or edema.  Right-sided PICC line has been placed with distal tip in expected position of right atrium; it is recommended to be withdrawn at least 2 cm.   Original Report Authenticated By: Lupita Raider.,  M.D.    [facility] 1 View  09/01/2012  *RADIOLOGY REPORT*  Clinical Data: Pneumonia.  PORTABLE CHEST - 1 VIEW  Comparison: Chest x-ray 08/31/2012.  Findings: Lung volumes are low.  Severe multifocal interstitial and airspace disease is noted throughout all aspects of the lungs bilaterally, increased compared with yesterday's examination. There may be small bilateral pleural effusions, however, the costophrenic sulci are excluded from the lower margin of the image. Cardiomegaly.  Cephalization of the pulmonary vasculature. The patient is rotated to the right on today's exam, resulting in distortion of the mediastinal contours and reduced diagnostic sensitivity and specificity for mediastinal pathology. Atherosclerosis of the thoracic aorta.  IMPRESSION: 1.  Persistent multifocal bilateral interstitial and airspace disease.  Given the cardiomegaly and cephalization of the pulmonary vasculature, findings are favored to reflect severe pulmonary edema related to congestive heart failure, however, superimposed multilobar pneumonia is difficult to exclude.   Original Report Authenticated By: Trudie Reed, M.D.    Dg Chest Port 1 View  08/31/2012  *RADIOLOGY REPORT*  Clinical Data: Pneumonia  PORTABLE CHEST - 1 VIEW   Comparison: 08/28/2012  Findings: Cardiac shadow remains mildly enlarged.  Patchy bilateral infiltrative changes are again seen.  Given some technical variations of the films there likely is stable.  Abnormality is seen.  IMPRESSION: Stable bilateral infiltrates.   Original Report Authenticated By: Alcide Clever, M.D.     Scheduled Meds: . amiodarone  400 mg Oral BID  . antiseptic oral rinse  15 mL Mouth Rinse QID  .  chlorhexidine  15 mL Mouth Rinse BID  . diltiazem  360 mg Oral Daily  . insulin aspart  0-20 Units Subcutaneous TID AC & HS  . methadone  5 mg Oral Daily  . metoprolol tartrate  50 mg Oral BID  . pantoprazole  40 mg Oral Daily  . QUEtiapine  50 mg Oral QHS   Continuous Infusions:   Time spent: 35 minutes.   LOS: 10 days   Worthington Cruzan  [facility] Pager [phone].  If 8PM-8AM, please contact night-coverage at [email or web address], password [email or web address] 09/29/2012, 11:08 AM

## 2012-09-29 NOTE — Progress Notes (Signed)
SUBJECTIVE:  Intermittently Confused.  Intermittent atrial fibrillation.  Now there is controlled response and no symptoms of palpitations per the nurses note 2 days ago.  Currently, Maintaining normal sinus rhythm on amiodarone, Cardizem, metoprolol.   OBJECTIVE:   Vitals:   Filed Vitals:   09/28/12 0502 09/28/12 1335 09/28/12 2029 09/29/12 0507  BP: 121/61 127/67 126/63 123/62  Pulse: 73 69 77 72  Temp: 97.1 F (36.2 C) 98.9 F (37.2 C) 98.1 F (36.7 C) 98 F (36.7 C)  TempSrc: Oral Oral Oral Oral  Resp: 18 20 18 19   Height:      Weight: 101.2 kg (223 lb 1.7 oz)   103.1 kg (227 lb 4.7 oz)  SpO2: 97% 94% 92% 92%   I&O's:    Intake/Output Summary (Last 24 hours) at 09/29/12 1610 Last data filed at 09/29/12 0820  Gross per 24 hour  Intake    840 ml  Output   1876 ml  Net  -1036 ml   TELEMETRY: Reviewed telemetry pt in normal sinus rhythm with bundle branch block:     PHYSICAL EXAM General: Well developed, well nourished, sweating Head: Normal cephalic and atramatic  Lungs:   Clear bilaterally to auscultation and percussion. Heart:  HRRR S1 S2  Abdomen: Bowel sounds are positive, abdomen soft and non-tender Msk:  . Normal strength and tone for age. Extremities:   No  edema.   Neuro: Alert and oriented X 3. Psych:  Normal affect, responds appropriately   LABS: Basic Metabolic Panel:  Recent Labs  96/04/54 0505 09/28/12 0530  NA 139 138  K 4.3 4.8  CL 104 105  CO2 28 28  GLUCOSE 113* 105*  BUN 20 18  CREATININE 1.44* 1.35  CALCIUM 8.6 8.4   Liver Function Tests: No results found for this basename: AST, ALT, ALKPHOS, BILITOT, PROT, ALBUMIN,  in the last 72 hours No results found for this basename: LIPASE, AMYLASE,  in the last 72 hours CBC:  Recent Labs  09/28/12 0530 09/29/12 0515  WBC 6.7 5.5  HGB 7.9* 7.5*  HCT 25.2* 23.7*  MCV 76.6* 75.5*  PLT 214 206   Cardiac Enzymes: No results found for this basename: CKTOTAL, CKMB, CKMBINDEX, TROPONINI,   in the last 72 hours BNP: No components found with this basename: POCBNP,  D-Dimer: No results found for this basename: DDIMER,  in the last 72 hours Hemoglobin A1C: No results found for this basename: HGBA1C,  in the last 72 hours Fasting Lipid Panel: No results found for this basename: CHOL, HDL, LDLCALC, TRIG, CHOLHDL, LDLDIRECT,  in the last 72 hours Thyroid Function Tests: No results found for this basename: TSH, T4TOTAL, FREET3, T3FREE, THYROIDAB,  in the last 72 hours Anemia Panel:  Recent Labs  09/28/12 2225  RETICCTPCT 1.5   Coag Panel:   Lab Results  Component Value Date   INR 2.87* 09/27/2012   INR 2.60* 09/26/2012   INR 2.72* 09/25/2012    09/19/2012  *RADIOLOGY REPORT*  Clinical Data: Altered level of consciousness.  Atrial fibrillation.  Insulin dependent diabetes.  Hypertension.  CT HEAD WITHOUT CONTRAST  Technique:  Contiguous axial images were obtained from the base of the skull through the vertex without contrast.  Comparison: 08/25/2012.  Findings: Mild atrophy and chronic microvascular ischemic change. No acute cortical infarction or hemorrhage.  No mass lesion or hydrocephalus.  Right  thalamic and right putaminal lacunes.  Left middle cranial fossa arachnoid cyst. Vascular calcifications. Nasopharyngeal adenoidal hypertrophy or dependent nasopharyngeal fluid.  Calvarium intact.  No acute sinus or mastoid disease.  IMPRESSION: Chronic changes as described.  No acute intracranial findings. Stable appearance from priors.   Original Report Authenticated By: Davonna Belling, M.D.       ASSESSMENT: Atrial fibrillation, coronary artery disease  PLAN:  Atrial fibrillation:It appears he will need an antiarrhythmic to maintain sinus rhythm.  He previously felt when he went out of rhythm.  He stated this has happened many times over the past year.  Continue amiodarone load.  At the time of discharge, would send home on amiodarone 400 mg daily.  He will likely need Cardizem CD 360  mg daily.  He'll need metoprolol 50 mg by mouth twice a day.  Anticoagulation: When I saw him in February of 2013, he was on aspirin and Plavix due to a drug-eluting stent that was placed a few years ago.  We did not put him on Coumadin. I was okay with him using warfarin if his mental status improved.  However, now he has a significant anemia.  Unclear what the source of this is.  Xarelto was started, but his hematocrit has dropped steadily over the past few days.  Anemia needs to be sorted out prior to this patient taking prolonged anticoagulation.  It appears that he may have been therapeutic on his Coumadin when the Xarelto was started.  I have stopped the Xarelto.  We'll see what INR is this morning.  Could also consider Eliquis given that it may have a slightly better risk profile.  Downside is that it has to be taken twice a day.   He previously was on atenolol.  He brought this medication to the hospital to show [place]. This apparently has been switched to metoprolol as an outpatient.  Either medication would be fine.     His confusion is definitely different from the previous few times that I've seen him. His mental status will have to be cleared up before I would send him home on anticoagulation.  Psych consult reveals that he has intact cognition.  Mental status significantly better today.  Corky Crafts., MD  09/29/2012  9:07 AM

## 2012-09-30 LAB — GLUCOSE, CAPILLARY: Glucose-Capillary: 115 mg/dL — ABNORMAL HIGH (ref 70–99)

## 2012-09-30 LAB — CBC
MCH: 23.4 pg — ABNORMAL LOW (ref 26.0–34.0)
MCHC: 31 g/dL (ref 30.0–36.0)
Platelets: 257 10*3/uL (ref 150–400)

## 2012-09-30 MED ORDER — PHYTONADIONE 1 MG/0.5 ML ORAL SOLUTION
1.0000 mg | Freq: Once | ORAL | Status: AC
  Administered 2012-09-30: 1 mg via ORAL
  Filled 2012-09-30: qty 0.5

## 2012-09-30 NOTE — Progress Notes (Signed)
Physical Therapy Treatment Patient Details Name: [name] MRN: [id number] DOB: [birth year] Today's Date: 09/30/2012 Time: 1036-1100 PT Time Calculation (min): 24 min  PT Assessment / Plan / Recommendation Comments on Treatment Session  Pt making steady progress towards PT goals, Will continue to work with patient and progress activity as tolerated. Will attempt further ambulation without assistive device next treatment session.    Follow Up Recommendations  Home health PT;Supervision for mobility/OOB;Other (comment) (Should go to rehab, but will likely choose home)     Does the patient have the potential to tolerate intense rehabilitation     Barriers to Discharge        Equipment Recommendations  None recommended by PT    Recommendations for Other Services    Frequency Min 3X/week   Plan Discharge plan remains appropriate;Frequency remains appropriate    Precautions / Restrictions Precautions Precautions: Fall Restrictions Weight Bearing Restrictions: No   Pertinent Vitals/Pain No pain; SpO2 >95% on rm air    Mobility  Bed Mobility Bed Mobility: Not assessed Transfers Transfers: Sit to Stand;Stand to Sit Sit to Stand: 5: Supervision Stand to Sit: 5: Supervision Details for Transfer Assistance: VCs for hand placement Ambulation/Gait Ambulation/Gait Assistance: 4: Min guard Ambulation Distance (Feet): 200 Feet (30 without assistive device) Assistive device: Rolling walker Ambulation/Gait Assistance Details: modestly unsteady with rw; VCs for upright posture; fatigued quickly when ambulating without device Gait Pattern: Step-through pattern Gait velocity: WNL General Gait Details: ambulated on room air; SpO2 remained >95% Stairs: No    Exercises General Exercises - Lower Extremity Ankle Circles/Pumps: AROM;Both;10 reps Long Arc Quad: AROM;Both;10 reps Hip Flexion/Marching: AROM;Both;10 reps Mini-Sqauts: AROM;Both;10 reps     PT Goals Acute Rehab PT  Goals PT Goal Formulation: With patient Time For Goal Achievement: 10/07/12 Potential to Achieve Goals: Fair Pt will go Sit to Stand: with supervision;with upper extremity assist PT Goal: Sit to Stand - Progress: Met Pt will go Stand to Sit: with supervision;with upper extremity assist PT Goal: Stand to Sit - Progress: Met Pt will Ambulate: 51 - 150 feet;with supervision;with least restrictive assistive device PT Goal: Ambulate - Progress: Progressing toward goal Pt will Go Up / Down Stairs: 3-5 stairs;with min assist;with rail(s)  Visit Information  Last PT Received On: 09/30/12 Assistance Needed: +1    Subjective Data  Subjective: i'm feeling pretty good today Patient Stated Goal: to go home   Cognition  Cognition Arousal/Alertness: Awake/alert Behavior During Therapy: WFL for tasks assessed/performed General Comments: Pt demonstrates significant improvement sin cognition today    Balance  Static Sitting Balance Static Sitting - Balance Support: Feet supported Static Sitting - Level of Assistance: 7: Independent Static Sitting - Comment/# of Minutes: 6 minutes during ther-ex Static Standing Balance Static Standing - Balance Support: During functional activity;Left upper extremity supported;Right upper extremity supported Static Standing - Level of Assistance: 5: Stand by assistance Static Standing - Comment/# of Minutes: 4 minutes during ther-ex activity  End of Session PT - End of Session Equipment Utilized During Treatment: Gait belt Activity Tolerance: Patient limited by fatigue Patient left: in chair;with call bell/phone within reach;with chair alarm set;with nursing in room;with family/visitor present Nurse Communication: Mobility status   GP     Fabio Asa 09/30/2012, 11:34 AM Charlotte Crumb, PT DPT  [phone]

## 2012-09-30 NOTE — Progress Notes (Signed)
[facility] PROGRESS NOTE  ZACKARY MCKEONE WUJ:[id number] DOB: [birth year] DOA: 09/19/2012 PCP: No primary provider on file.  Brief narrative: [name] is an 72 y.o. male with a past medical history of recent aspiration pneumonia, atrial fibrillation with rapid ventricular response, COPD, and diabetes who initially presented to [facility] on 09/19/2012 with altered mental status requiring intubation for airway protection. He subsequently was transferred to [facility] under the care of the critical care service. He was successfully extubated on 09/20/2012 after being pan cultured and treated with broad-spectrum antibiotics.  Assessment/Plan: Principal Problem:   Acute respiratory failure secondary to pulmonary edema and community-acquired pneumonia / history of obstructive sleep apnea and COPD -Intubated 4/12-4/13. -Continue CPAP each bedtime. Continue albuterol as needed. -Continue oxygen to keep sats greater than 92%. -Finished a course of empiric antibiotics which were narrowed to Rocephin. Initially on vancomycin/Zosyn. -Diuresis as tolerated. Active Problems:   Atrial fibrillation with rapid ventricular response -Cardiology following and adjusting rate/rhythm control medications. -Continue amiodarone , Cardizem, and metoprolol. -Anticoagulation on hold while evaluating source of anemia.   CAD (coronary artery disease), native coronary artery -No evidence of acute coronary syndrome.   IDDM (insulin dependent diabetes mellitus) -Continue SSI. CBGs 93-158.   Hypertension associated with diabetes / hypertensive emergency with acute pulmonary edema -Blood pressure currently controlled on antihypertensives.   HCAP (healthcare-associated pneumonia) -Initially treated with broad-spectrum antibiotics, antibiotics subsequently narrowed to Rocephin and discontinued.   COPD (chronic obstructive pulmonary disease) -Respiratory status stable. Continue albuterol when necessary.    Coagulopathy -Secondary to Coumadin for chronic atrial fibrillation. -INR persistently elevated despite Coumadin being on hold. -May need to reverse this with vitamin K for any further drop in hemoglobin.   History of hepatitis -No elevation of LFTs noted.    Status post nephrectomy -Renal function stable.   Acute encephalopathy -Continue to monitor his mental status. -Status post psychiatric evaluation on 09/25/2012: Delirium with visual hallucinations. 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 Monitor. -CT of the head was negative for acute findings. MRI showed chronic ischemic changes. -Felt to have capacity per psychiatric evaluation.   Acute kidney injury -Creatinine improved with hydration.   Normocytic anemia / Hemoccult + stools (in the setting of supratherapeutic INR) -Hemoglobin has dropped steadily over the course of his hospital stay. -B12 685, folic acid 10.8, ferritin 48, iron low at 13 with a total iron binding capacity of 321 and a urine iron binding capacity of 308. Reticulocyte count is low for degree of anemia. These findings are most suggestive of anemia of chronic disease. -Continue B12 supplementation therapy. -Fecal occult blood testing was positive.  -Continue PPI therapy. -Will ask GI to evaluate.  Code Status: Full. Family Communication: No family at bedside today. Disposition Plan: Home when stable.   Medical Consultants:  Dr. Lance Muss, Cardiology  Dr. Darrol Jump, Psychiatry  Dr. Rory Percy, PCCM  Other Consultants:  Occupational therapy: Home OT  Anti-infectives:  Vancomycin 4/12 >>4/14   Zosyn 4/12 >>4/12   Ceftriaxone 4/14>>>4/16    HPI/Subjective: Jess Barters is without complaint today. He denies chest pain, dyspnea, cough. Bowels are moving. Appetite is good. Feels very close to his baseline level of functioning.  No  black or bloody stools.    Objective: Filed Vitals:   09/29/12 0507 09/29/12 1052 09/29/12 2010 09/30/12 0516  BP: 123/62 144/62 147/72 129/75  Pulse: 72 85 75 69  Temp:  98 F (36.7 C)  98.2 F (36.8 C) 97.8 F (36.6 C)  TempSrc: Oral  Oral Oral  Resp: 19  18 18   Height:      Weight: 103.1 kg (227 lb 4.7 oz)   102.5 kg (225 lb 15.5 oz)  SpO2: 92%  97% 94%    Intake/Output Summary (Last 24 hours) at 09/30/12 1012 Last data filed at 09/30/12 0300  Gross per 24 hour  Intake    240 ml  Output   1350 ml  Net  -1110 ml    Exam: Gen:  NAD Cardiovascular:  RRR, No M/R/G Respiratory:  Lungs CTAB, diminished Gastrointestinal:  Abdomen soft, NT/ND, + BS Extremities:  Trace edema  Data Reviewed: Basic Metabolic Panel:  Recent Labs Lab 09/24/12 0435 09/25/12 0656 09/26/12 0645 09/27/12 0505 09/28/12 0530  NA 138 141 143 139 138  K 4.2 3.8 4.0 4.3 4.8  CL 104 106 105 104 105  CO2 25 27 28 28 28   GLUCOSE 163* 94 108* 113* 105*  BUN 15 14 17 20 18   CREATININE 1.17 1.27 1.37* 1.44* 1.35  CALCIUM 8.8 9.0 9.1 8.6 8.4   GFR Estimated Creatinine Clearance: 58.9 ml/min (by C-G formula based on Cr of 1.35).  Coagulation profile  Recent Labs Lab 09/24/12 0435 09/25/12 0656 09/26/12 0645 09/27/12 0505 09/29/12 1006  INR 2.67* 2.72* 2.60* 2.87* 3.47*    CBC:  Recent Labs Lab 09/26/12 0645 09/27/12 0505 09/28/12 0530 09/29/12 0515 09/30/12 0550  WBC 9.1 6.5 6.7 5.5 5.2  HGB 9.5* 8.1* 7.9* 7.5* 7.5*  HCT 30.3* 25.8* 25.2* 23.7* 24.2*  MCV 76.3* 76.3* 76.6* 75.5* 75.4*  PLT 230 219 214 206 257   BNP (last 3 results)  Recent Labs  08/21/12 1219 09/19/12 1300 09/20/12 0527  PROBNP 516.5* 681.2* 796.8*   CBG:  Recent Labs Lab 09/29/12 1105 09/29/12 1603 09/29/12 2059 09/30/12 0608 09/30/12 0633  GLUCAP 139* 140* 134* 93 158*   Anemia work up  Recent Labs  09/28/12 2225  VITAMINB12 685  FOLATE 10.8  FERRITIN 48  TIBC 321  IRON 13*   RETICCTPCT 1.5   Microbiology No results found for this or any previous visit (from the past 240 hour(s)).   Procedures and Diagnostic Studies: Dg Chest 2 View  09/04/2012  *RADIOLOGY REPORT*  Clinical Data: Assess infiltrates  CHEST - 2 VIEW  Comparison: 09/03/2012  Findings: Right-sided PICC line is again seen.  Bilateral infiltrates are again noted and stable.  Cardiac shadow is stable.  IMPRESSION: Stable bilateral infiltrative change   Original Report Authenticated By: Alcide Clever, M.D.    Dg Chest 2 View  09/03/2012  *RADIOLOGY REPORT*  Clinical Data: Previously seen infiltrate, check for resolution  CHEST - 2 VIEW  Comparison: 09/01/2012  Findings: The cardiac shadow is again enlarged.  Bilateral infiltrates are again seen and stable.  The right-sided PICC line is again noted.  No new focal abnormality is seen.  IMPRESSION: Stable bilateral infiltrates   Original Report Authenticated By: Alcide Clever, M.D.    Ct Head Wo Contrast  09/24/2012  *RADIOLOGY REPORT*  Clinical Data: Head trauma secondary to a fall.  Scalp contusion.  CT HEAD WITHOUT CONTRAST  Technique:  Contiguous axial images were obtained from the base of the skull through the vertex without contrast.  Comparison: CT scan dated 09/19/2012  Findings: There is no acute intracranial hemorrhage, infarction, or mass lesion.  There are old lacunar infarcts in the right basal ganglia.  There is  a stable arachnoid cyst in the left temporal fossa.  Osseous structures are normal.  IMPRESSION: No acute abnormalities.   Original Report Authenticated By: Francene Boyers, M.D.    Ct Head Wo Contrast  09/19/2012  *RADIOLOGY REPORT*  Clinical Data: Altered level of consciousness.  Atrial fibrillation.  Insulin dependent diabetes.  Hypertension.  CT HEAD WITHOUT CONTRAST  Technique:  Contiguous axial images were obtained from the base of the skull through the vertex without contrast.  Comparison: 08/25/2012.  Findings: Mild atrophy and chronic  microvascular ischemic change. No acute cortical infarction or hemorrhage.  No mass lesion or hydrocephalus.  Right  thalamic and right putaminal lacunes.  Left middle cranial fossa arachnoid cyst. Vascular calcifications. Nasopharyngeal adenoidal hypertrophy or dependent nasopharyngeal fluid.  Calvarium intact.  No acute sinus or mastoid disease.  IMPRESSION: Chronic changes as described.  No acute intracranial findings. Stable appearance from priors.   Original Report Authenticated By: Davonna Belling, M.D.    Mr Brain Wo Contrast  09/24/2012  *RADIOLOGY REPORT*  Clinical Data: Confusion.  Encephalopathy.  MRI HEAD WITHOUT CONTRAST  Technique:  Multiplanar, multiecho pulse sequences of the brain and surrounding structures were obtained according to standard protocol without intravenous contrast.  Comparison: CT 09/19/2012  Findings: Negative for acute infarct.  Generalized atrophy.  Chronic ischemic changes are present in the white matter.  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  Small chronic infarct right posterior cerebellum.  Arachnoid cyst anterior to the left temporal lobe measuring 25 x 30 mm.  Negative for intracranial hemorrhage.  Negative for mass lesion.  IMPRESSION: Chronic ischemic changes.  No acute infarct.   Original Report Authenticated By: Janeece Riggers, M.D.    Dg Chest Port 1 View  09/20/2012  *RADIOLOGY REPORT*  Clinical Data: Ventilator dependent respiratory failure.  Follow up CHF.  PORTABLE CHEST - 1 VIEW 09/20/2012 0537 hours:  Comparison: Portable chest x-ray yesterday and 09/01/2012  Findings: Endotracheal tube tip in satisfactory position approximately 5 cm above carina.  Nasogastric tube courses below the diaphragm into the stomach.  Cardiac silhouette enlarged but stable.  Interval slight improvement in the interstitial and airspace pulmonary edema, though moderate edema persist.  New dense atelectasis in both lower lobes.  IMPRESSION: Support apparatus  satisfactory.  Slight improvement in CHF, though moderate interstitial and airspace edema persists.  New dense bilateral lower lobe atelectasis.  Cough,   Original Report Authenticated By: Hulan Saas, M.D.    Dg Chest Portable 1 View  09/19/2012  *RADIOLOGY REPORT*  Clinical Data: Shortness of breath and post intubation.  PORTABLE CHEST - 1 VIEW  Comparison: 09/04/2012  Findings: Endotracheal tube is 6.3 cm above the carina. Nasogastric tube extends into the abdomen.  Again noted are diffuse interstitial densities and possibly airspace disease in the right lower lung.  There is a cardiac pad overlying the left upper chest. Stable appearance of the cardiomediastinal silhouette.  IMPRESSION: Support apparatuses as described.  Diffuse interstitial lung densities.  Findings could represent interstitial pulmonary edema but infection cannot be excluded.   Original Report Authenticated By: Richarda Overlie, M.D.    Dg Chest Port 1 View  09/01/2012  *RADIOLOGY REPORT*  Clinical Data: PICC line placement.  PORTABLE CHEST - 1 VIEW  Comparison: Same date.  Findings: Interval placement of right-sided PICC line with distal tip in the expected position of the right atrium.  Stable mild cardiomegaly is noted.  Bilateral airspace opacities are noted concerning for pneumonia or edema.  Bony thorax is  intact.  IMPRESSION: Continued presence of bilateral lung opacities concerning for pneumonia or edema.  Right-sided PICC line has been placed with distal tip in expected position of right atrium; it is recommended to be withdrawn at least 2 cm.   Original Report Authenticated By: Lupita Raider.,  M.D.    [facility] 1 View  09/01/2012  *RADIOLOGY REPORT*  Clinical Data: Pneumonia.  PORTABLE CHEST - 1 VIEW  Comparison: Chest x-ray 08/31/2012.  Findings: Lung volumes are low.  Severe multifocal interstitial and airspace disease is noted throughout all aspects of the lungs bilaterally, increased compared with yesterday's  examination. There may be small bilateral pleural effusions, however, the costophrenic sulci are excluded from the lower margin of the image. Cardiomegaly.  Cephalization of the pulmonary vasculature. The patient is rotated to the right on today's exam, resulting in distortion of the mediastinal contours and reduced diagnostic sensitivity and specificity for mediastinal pathology. Atherosclerosis of the thoracic aorta.  IMPRESSION: 1.  Persistent multifocal bilateral interstitial and airspace disease.  Given the cardiomegaly and cephalization of the pulmonary vasculature, findings are favored to reflect severe pulmonary edema related to congestive heart failure, however, superimposed multilobar pneumonia is difficult to exclude.   Original Report Authenticated By: Trudie Reed, M.D.    Dg Chest Port 1 View  08/31/2012  *RADIOLOGY REPORT*  Clinical Data: Pneumonia  PORTABLE CHEST - 1 VIEW  Comparison: 08/28/2012  Findings: Cardiac shadow remains mildly enlarged.  Patchy bilateral infiltrative changes are again seen.  Given some technical variations of the films there likely is stable.  Abnormality is seen.  IMPRESSION: Stable bilateral infiltrates.   Original Report Authenticated By: Alcide Clever, M.D.     Scheduled Meds: . amiodarone  400 mg Oral BID  . antiseptic oral rinse  15 mL Mouth Rinse QID  . chlorhexidine  15 mL Mouth Rinse BID  . diltiazem  360 mg Oral Daily  . insulin aspart  0-20 Units Subcutaneous TID AC & HS  . methadone  5 mg Oral Daily  . metoprolol tartrate  50 mg Oral BID  . pantoprazole  40 mg Oral Daily  . polyethylene glycol  17 g Oral Daily  . QUEtiapine  50 mg Oral QHS   Continuous Infusions:   Time spent: 25 minutes.   LOS: 11 days   Kaislyn Gulas  [facility] Pager [phone].  If 8PM-8AM, please contact night-coverage at [email or web address], password [facility] 09/30/2012, 10:12 AM

## 2012-09-30 NOTE — Consult Note (Signed)
[facility] Consultation Note  Referring Provider: Dr. Hillery Aldo [facility]) Primary Care Physician:  No primary provider on file.  Reason for Consultation:  Anemia, hemoccult-positive stool.  HPI: [name] is a 72 y.o. male admitted from [facility] for altered mental status, with history of atrial fibrillation with rapid ventricular response, Diabetes, COPD.  He has been extubated and managed for his volume overload and community acquired anemia and atrial fibrillation.  He has had slow downhill trajectory of his hemoglobin, on anticoagulation, with hemoccult-positive stool.  He noticed some dark black-colored stool a couple weeks ago, thought it was from foods he was eating.  Denies oral iron.  No abdominal pain, hematemesis, hematochezia.  Had endoscopy and colonoscopy, ? Indication, about 6-7 years ago in [place], patient unclear of results.   Past Medical History  Diagnosis Date  . Coronary artery disease   . Hypertension   . Cancer   . Pneumonia   . CHF (congestive heart failure)   . High cholesterol   . Diabetes mellitus   . Hepatitis   . Renal disorder     kidney cancer  . LBBB (left bundle branch block)   . Paroxysmal atrial fibrillation   . Sleep apnea     Past Surgical History  Procedure Laterality Date  . Back surgery    . Coronary angioplasty with stent placement    . Nephrectomy      Prior to Admission medications   Medication Sig Start Date End Date Taking? Authorizing Provider  cloNIDine (CATAPRES) 0.1 MG tablet Take 2 tablets (0.2 mg total) by mouth 3 (three) times daily. 09/07/12  Yes Isabella Stalling, MD  diltiazem (CARDIZEM SR) 120 MG 12 hr capsule Take 1 capsule (120 mg total) by mouth every 12 (twelve) hours. 09/07/12  Yes Isabella Stalling, MD  HYDROcodone-acetaminophen [facility]) 10-325 MG per tablet Take 1 tablet by mouth every 6 (six) hours as needed for pain.   Yes Historical Provider, MD  insulin glargine (LANTUS) 100 UNIT/ML injection Inject  40 Units into the skin at bedtime.   Yes Historical Provider, MD  ipratropium (ATROVENT) 0.02 % nebulizer solution Take 0.5 mg by nebulization every 6 (six) hours as needed. Shortness of breath 09/07/12  Yes Isabella Stalling, MD  iron polysaccharides (NIFEREX) 150 MG capsule Take 1 capsule (150 mg total) by mouth 2 (two) times daily. 09/07/12  Yes Isabella Stalling, MD  methadone (DOLOPHINE) 5 MG tablet Take 5 mg by mouth daily. 09/07/12  Yes Isabella Stalling, MD  metoprolol (LOPRESSOR) 50 MG tablet Take 1 tablet (50 mg total) by mouth 2 (two) times daily. 09/07/12  Yes Isabella Stalling, MD  pantoprazole (PROTONIX) 40 MG tablet Take 40 mg by mouth daily.   Yes Historical Provider, MD  tamsulosin (FLOMAX) 0.4 MG CAPS Take 1 capsule (0.4 mg total) by mouth daily after supper. 09/07/12  Yes Isabella Stalling, MD  warfarin (COUMADIN) 7.5 MG tablet Take 1 tablet (7.5 mg total) by mouth daily. 09/07/12  Yes Isabella Stalling, MD  nitroGLYCERIN (NITROSTAT) 0.4 MG SL tablet Place 0.4 mg under the tongue every 5 (five) minutes as needed. Chest pain     Historical Provider, MD    Current Facility-Administered Medications  Medication Dose Route Frequency Provider Last Rate Last Dose  . albuterol (PROVENTIL) (5 MG/ML) 0.5% nebulizer solution 2.5 mg  2.5 mg Nebulization Q3H PRN Coralyn Helling, MD      . amiodarone (PACERONE) tablet 400 mg  400 mg  Oral BID Nelda Bucks, MD   400 mg at 09/30/12 1056  . antiseptic oral rinse (BIOTENE) solution 15 mL  15 mL Mouth Rinse QID Coralyn Helling, MD   15 mL at 09/30/12 0528  . chlorhexidine (PERIDEX) 0.12 % solution 15 mL  15 mL Mouth Rinse BID Coralyn Helling, MD   15 mL at 09/29/12 2145  . diltiazem (CARDIZEM CD) 24 hr capsule 360 mg  360 mg Oral Daily Richarda Overlie, MD   360 mg at 09/30/12 1057  . fentaNYL (SUBLIMAZE) injection 25-50 mcg  25-50 mcg Intravenous Q2H PRN Coralyn Helling, MD      . insulin aspart (novoLOG) injection 0-20 Units  0-20 Units Subcutaneous TID AC &  HS Nelda Bucks, MD   4 Units at 09/30/12 1203  . ipratropium (ATROVENT) nebulizer solution 0.5 mg  0.5 mg Nebulization Q3H PRN Coralyn Helling, MD      . methadone (DOLOPHINE) tablet 5 mg  5 mg Oral Daily Richarda Overlie, MD   5 mg at 09/30/12 1058  . metoprolol (LOPRESSOR) tablet 50 mg  50 mg Oral BID Corky Crafts, MD   50 mg at 09/30/12 1056  . nitroGLYCERIN (NITROSTAT) SL tablet 0.4 mg  0.4 mg Sublingual Q5 min PRN Ky Barban, MD   0.4 mg at 09/21/12 1455  . pantoprazole (PROTONIX) EC tablet 40 mg  40 mg Oral Daily Drake Leach Rumbarger, RPH   40 mg at 09/30/12 1057  . polyethylene glycol (MIRALAX / GLYCOLAX) packet 17 g  17 g Oral Daily Maryruth Bun Rama, MD   17 g at 09/30/12 1058  . QUEtiapine (SEROQUEL) tablet 50 mg  50 mg Oral QHS Nehemiah Settle, MD   50 mg at 09/29/12 2145    Allergies as of 09/19/2012  . (No Known Allergies)    Family History  Problem Relation Age of Onset  . Hyperlipidemia Mother   . Heart attack Father   . Diabetes Mother     History   Social History  . Marital Status: Legally Separated    Spouse Name: N/A    Number of Children: N/A  . Years of Education: N/A   Occupational History  . Not on file.   Social History Main Topics  . Smoking status: Former Games developer  . Smokeless tobacco: Not on file  . Alcohol Use: No  . Drug Use: No  . Sexually Active: Not Currently   Other Topics Concern  . Not on file   Social History Narrative  . No narrative on file    Review of Systems: As per HPI, all others negative  Physical Exam: Vital signs in last 24 hours: Temp:  [97.8 F (36.6 C)-98.7 F (37.1 C)] 98.7 F (37.1 C) (04/23 1345) Pulse Rate:  [69-80] 77 (04/23 1345) Resp:  [18] 18 (04/23 1345) BP: (129-147)/(72-78) 146/78 mmHg (04/23 1345) SpO2:  [93 %-97 %] 93 % (04/23 1345) Weight:  [102.5 kg (225 lb 15.5 oz)] 102.5 kg (225 lb 15.5 oz) (04/23 0516) Last BM Date: 09/30/12 General:   Overweight, chronically  ill-appearing, older-appearing than stated age, alert,  Well-developed, well-nourished, pleasant and cooperative in NAD Head:  Normocephalic and atraumatic. Eyes:  Sclera clear, no icterus.   Conjunctiva somewhat pale. Ears:  Normal auditory acuity. Nose:  No deformity, discharge,  or lesions. Mouth:  No deformity or lesions.  Oropharynx pink & moist. Neck:  Supple; no masses or thyromegaly. Lungs:  Diminished breath sounds, L > R base; otherwise clear  throughout to auscultation.   No wheezes, crackles, or rhonchi. No acute distress. Heart:  Regular rate and rhythm; no murmurs, clicks, rubs,  or gallops. Abdomen:  Soft, nontender and nondistended. No masses, hepatosplenomegaly or hernias noted. Normal bowel sounds, without guarding, and without rebound.     Msk:  Symmetrical without gross deformities. Normal posture. Pulses:  Normal pulses noted. Extremities:  Without clubbing or edema. Neurologic:  Alert and  oriented x4;  grossly normal neurologically. Skin:  Pale-appearing, scattered ecchymoses, otherwise intact without significant lesions or rashes. Psych:  Alert and cooperative. Normal mood and affect.  Lab Results:  Recent Labs  09/28/12 0530 09/29/12 0515 09/30/12 0550  WBC 6.7 5.5 5.2  HGB 7.9* 7.5* 7.5*  HCT 25.2* 23.7* 24.2*  PLT 214 206 257   BMET  Recent Labs  09/28/12 0530  NA 138  K 4.8  CL 105  CO2 28  GLUCOSE 105*  BUN 18  CREATININE 1.35  CALCIUM 8.4   LFT No results found for this basename: PROT, ALBUMIN, AST, ALT, ALKPHOS, BILITOT, BILIDIR, IBILI,  in the last 72 hours PT/INR  Recent Labs  09/29/12 1006  LABPROT 32.9*  INR 3.47*    Studies/Results: No results found.  Impression:  1.  Anemia (microcytic with equivocal iron-deficiency indices) with hemoccult-positive stool, in setting medication-related coagulopathy. 2.  Dark stools, ? Melena, for the past few weeks. 3.  Chronic atrial fibrillation, refractory, on anticoagulation (but  currently on hold).  Plan:  1.  Endoscopy once INR < 2 (hopefully by Friday); anticoagulants are on hold. If endoscopy unrevealing, would then need to consider colonoscopy. 2.  Pantoprazole.  3.  Patient aware of rationale of endoscopy and colonoscopy, and is agreeable to proceed as felt indicated, once coagulopathy has resolved. 4.  Will revisit tomorrow.  Thank you for the consult.   LOS: 11 days   Asyria Kolander M  09/30/2012, 2:50 PM

## 2012-09-30 NOTE — Progress Notes (Signed)
SUBJECTIVE:    Currently, Maintaining normal sinus rhythm on amiodarone, Cardizem, metoprolol.  No bleeding that he has noticed.  No melena or BRBPR.  OBJECTIVE:   Vitals:   Filed Vitals:   09/29/12 0507 09/29/12 1052 09/29/12 2010 09/30/12 0516  BP: 123/62 144/62 147/72 129/75  Pulse: 72 85 75 69  Temp: 98 F (36.7 C)  98.2 F (36.8 C) 97.8 F (36.6 C)  TempSrc: Oral  Oral Oral  Resp: 19  18 18   Height:      Weight: 103.1 kg (227 lb 4.7 oz)   102.5 kg (225 lb 15.5 oz)  SpO2: 92%  97% 94%   I&O's:    Intake/Output Summary (Last 24 hours) at 09/30/12 1610 Last data filed at 09/30/12 0300  Gross per 24 hour  Intake    240 ml  Output   1350 ml  Net  -1110 ml   TELEMETRY: Reviewed telemetry pt in normal sinus rhythm with bundle branch block:     PHYSICAL EXAM General: Well developed, well nourished, sweating Head: Normal cephalic and atramatic  Lungs:   Clear bilaterally to auscultation and percussion. Heart:  HRRR S1 S2  Abdomen: Mild left sided abdominal tenderness. Msk:  . Normal strength and tone for age. Extremities:   No  edema.   Neuro: Alert and oriented X 3. Psych:  Normal affect, responds appropriately   LABS: Basic Metabolic Panel:  Recent Labs  96/04/54 0530  NA 138  K 4.8  CL 105  CO2 28  GLUCOSE 105*  BUN 18  CREATININE 1.35  CALCIUM 8.4   Liver Function Tests: No results found for this basename: AST, ALT, ALKPHOS, BILITOT, PROT, ALBUMIN,  in the last 72 hours No results found for this basename: LIPASE, AMYLASE,  in the last 72 hours CBC:  Recent Labs  09/29/12 0515 09/30/12 0550  WBC 5.5 5.2  HGB 7.5* 7.5*  HCT 23.7* 24.2*  MCV 75.5* 75.4*  PLT 206 257   Cardiac Enzymes: No results found for this basename: CKTOTAL, CKMB, CKMBINDEX, TROPONINI,  in the last 72 hours BNP: No components found with this basename: POCBNP,  D-Dimer: No results found for this basename: DDIMER,  in the last 72 hours Hemoglobin A1C: No results found  for this basename: HGBA1C,  in the last 72 hours Fasting Lipid Panel: No results found for this basename: CHOL, HDL, LDLCALC, TRIG, CHOLHDL, LDLDIRECT,  in the last 72 hours Thyroid Function Tests: No results found for this basename: TSH, T4TOTAL, FREET3, T3FREE, THYROIDAB,  in the last 72 hours Anemia Panel:  Recent Labs  09/28/12 2225  VITAMINB12 685  FOLATE 10.8  FERRITIN 48  TIBC 321  IRON 13*  RETICCTPCT 1.5   Coag Panel:   Lab Results  Component Value Date   INR 3.47* 09/29/2012   INR 2.87* 09/27/2012   INR 2.60* 09/26/2012    09/19/2012  *RADIOLOGY REPORT*  Clinical Data: Altered level of consciousness.  Atrial fibrillation.  Insulin dependent diabetes.  Hypertension.  CT HEAD WITHOUT CONTRAST  Technique:  Contiguous axial images were obtained from the base of the skull through the vertex without contrast.  Comparison: 08/25/2012.  Findings: Mild atrophy and chronic microvascular ischemic change. No acute cortical infarction or hemorrhage.  No mass lesion or hydrocephalus.  Right  thalamic and right putaminal lacunes.  Left middle cranial fossa arachnoid cyst. Vascular calcifications. Nasopharyngeal adenoidal hypertrophy or dependent nasopharyngeal fluid.  Calvarium intact.  No acute sinus or mastoid disease.  IMPRESSION: Chronic changes  as described.  No acute intracranial findings. Stable appearance from priors.   Original Report Authenticated By: Davonna Belling, M.D.       ASSESSMENT: Atrial fibrillation, coronary artery disease  PLAN:  Atrial fibrillation:It appears he will need an antiarrhythmic to maintain sinus rhythm.  He previously felt when he went out of rhythm.  He stated this has happened many times over the past year.  Continue amiodarone load.  At the time of discharge, would send home on amiodarone 400 mg daily.  He will likely need Cardizem CD 360 mg daily.  He'll need metoprolol 50 mg by mouth twice a day.  Anticoagulation: When I saw him in February of 2013, he  was on aspirin and Plavix due to a drug-eluting stent that was placed a few years ago.  We did not put him on Coumadin. I was okay with him using warfarin if his mental status improved.  However, now he has a significant anemia.  Unclear what the source of this is.  Xarelto was started, but his hematocrit has dropped steadily over the past few days.  Anemia needs to be sorted out prior to this patient taking prolonged anticoagulation.  Could also consider Eliquis given that it may have a slightly better risk profile.  Downside is that it has to be taken twice a day.  Still supratherapeutic INR at this time.   He previously was on atenolol.  He brought this medication to the hospital to show [place]. This apparently has been switched to metoprolol as an outpatient.  Either medication would be fine.   Left sided abdominal pain.  No rebound or guarding.  Watch for any sign of bleeding there.  Have to consider rectus sheath hematoma given increased INR if no other source of anemia discovered.   Psych consult reveals that he has intact cognition.  Mental status significantly better today.  Corky Crafts., MD  09/30/2012  9:03 AM

## 2012-10-01 LAB — CBC
MCV: 75.5 fL — ABNORMAL LOW (ref 78.0–100.0)
Platelets: 317 10*3/uL (ref 150–400)
RDW: 17.1 % — ABNORMAL HIGH (ref 11.5–15.5)
WBC: 5.4 10*3/uL (ref 4.0–10.5)

## 2012-10-01 LAB — GLUCOSE, CAPILLARY: Glucose-Capillary: 121 mg/dL — ABNORMAL HIGH (ref 70–99)

## 2012-10-01 LAB — PROTIME-INR: Prothrombin Time: 16.7 seconds — ABNORMAL HIGH (ref 11.6–15.2)

## 2012-10-01 NOTE — Progress Notes (Signed)
Physical Therapy Treatment Patient Details Name: [name] MRN: [id number] DOB: 20-Jul-1940 Today's Date: 10/01/2012 Time: 1150-1205 PT Time Calculation (min): 15 min  PT Assessment / Plan / Recommendation Comments on Treatment Session  Pt with better stability using rw today. Still very unsteady without. Rec continued use of rw. SpO2 remained >94% on rm air, but pt DOE increased and session limited by work of breathing. Patient sat EOB with some SOB and shaking for approx 2 minutes before resolved.  Will continue to see and progress activity as tolerated.    Follow Up Recommendations  Home health PT;Supervision for mobility/OOB;Other (comment) (Should go to rehab, but will likely choose home)           Equipment Recommendations  None recommended by PT       Frequency Min 3X/week   Plan Discharge plan remains appropriate;Frequency remains appropriate    Precautions / Restrictions Precautions Precautions: Fall Restrictions Weight Bearing Restrictions: No   Pertinent Vitals/Pain No pain at this time; SpO2 >93%    Mobility  Bed Mobility Bed Mobility: Not assessed Supine to Sit: 6: Modified independent (Device/Increase time) Transfers Transfers: Sit to Stand;Stand to Sit Sit to Stand: 5: Supervision Stand to Sit: 5: Supervision Details for Transfer Assistance: VCs for hand placement Ambulation/Gait Ambulation/Gait Assistance: 5: Supervision Ambulation Distance (Feet): 200 Feet Assistive device: Rolling walker Ambulation/Gait Assistance Details: better stability with rw rec continued use Gait Pattern: Step-through pattern Gait velocity: WNL General Gait Details: ambulated on room air; SpO2 remained >95% Stairs: No      PT Goals Acute Rehab PT Goals PT Goal Formulation: With patient Time For Goal Achievement: 10/07/12 Potential to Achieve Goals: Fair Pt will go Sit to Stand: with supervision;with upper extremity assist PT Goal: Sit to Stand - Progress: Met Pt  will go Stand to Sit: with supervision;with upper extremity assist PT Goal: Stand to Sit - Progress: Met Pt will Ambulate: 51 - 150 feet;with supervision;with least restrictive assistive device PT Goal: Ambulate - Progress: Met Pt will Go Up / Down Stairs: 3-5 stairs;with min assist;with rail(s)  Visit Information  Last PT Received On: 10/01/12 Assistance Needed: +1    Subjective Data  Subjective: I have a black belt Patient Stated Goal: to go home   Cognition  Cognition Arousal/Alertness: Awake/alert Behavior During Therapy: WFL for tasks assessed/performed Overall Cognitive Status:  (apparent STM deficits) General Comments: Pt demonstrates significant improvement sin cognition today    Balance  Static Standing Balance Static Standing - Balance Support: During functional activity;Left upper extremity supported;Right upper extremity supported Static Standing - Level of Assistance: 5: Stand by assistance  End of Session PT - End of Session Equipment Utilized During Treatment: Gait belt Activity Tolerance: Patient limited by fatigue Patient left: in chair;with call bell/phone within reach;with chair alarm set;with nursing in room;with family/visitor present Nurse Communication: Mobility status   GP     Fabio Asa 10/01/2012, 12:16 PM Charlotte Crumb, PT DPT  [phone]

## 2012-10-01 NOTE — Progress Notes (Signed)
SUBJECTIVE:    Currently, Maintaining normal sinus rhythm on amiodarone, Cardizem, metoprolol.  Heme positive stool.  OBJECTIVE:   Vitals:   Filed Vitals:   09/30/12 2057 09/30/12 2250 10/01/12 0447 10/01/12 1028  BP: 137/67  128/65 137/63  Pulse: 70 72 69 75  Temp: 98.4 F (36.9 C)  97.2 F (36.2 C)   TempSrc: Oral  Oral   Resp: 18 18 20    Height:      Weight:   101.696 kg (224 lb 3.2 oz)   SpO2: 96% 97% 96%    I&O's:    Intake/Output Summary (Last 24 hours) at 10/01/12 1336 Last data filed at 10/01/12 0900  Gross per 24 hour  Intake    840 ml  Output   1601 ml  Net   -761 ml   TELEMETRY: Reviewed telemetry pt in normal sinus rhythm with bundle branch block:     PHYSICAL EXAM General: Well developed, well nourished, sweating Head: Normal cephalic and atramatic  Lungs:   Clear bilaterally to auscultation and percussion. Heart:  HRRR S1 S2  Abdomen: Mild left sided abdominal tenderness. Msk:  . Normal strength and tone for age. Extremities:   No  edema.   Neuro: Alert and oriented X 3. Psych:  Normal affect, responds appropriately   LABS: Basic Metabolic Panel: No results found for this basename: NA, K, CL, CO2, GLUCOSE, BUN, CREATININE, CALCIUM, MG, PHOS,  in the last 72 hours Liver Function Tests: No results found for this basename: AST, ALT, ALKPHOS, BILITOT, PROT, ALBUMIN,  in the last 72 hours No results found for this basename: LIPASE, AMYLASE,  in the last 72 hours CBC:  Recent Labs  09/30/12 0550 10/01/12 0510  WBC 5.2 5.4  HGB 7.5* 8.4*  HCT 24.2* 27.1*  MCV 75.4* 75.5*  PLT 257 317   Cardiac Enzymes: No results found for this basename: CKTOTAL, CKMB, CKMBINDEX, TROPONINI,  in the last 72 hours BNP: No components found with this basename: POCBNP,  D-Dimer: No results found for this basename: DDIMER,  in the last 72 hours Hemoglobin A1C: No results found for this basename: HGBA1C,  in the last 72 hours Fasting Lipid Panel: No results found  for this basename: CHOL, HDL, LDLCALC, TRIG, CHOLHDL, LDLDIRECT,  in the last 72 hours Thyroid Function Tests: No results found for this basename: TSH, T4TOTAL, FREET3, T3FREE, THYROIDAB,  in the last 72 hours Anemia Panel:  Recent Labs  09/28/12 2225  VITAMINB12 685  FOLATE 10.8  FERRITIN 48  TIBC 321  IRON 13*  RETICCTPCT 1.5   Coag Panel:   Lab Results  Component Value Date   INR 1.39 10/01/2012   INR 3.47* 09/29/2012   INR 2.87* 09/27/2012    09/19/2012  *RADIOLOGY REPORT*  Clinical Data: Altered level of consciousness.  Atrial fibrillation.  Insulin dependent diabetes.  Hypertension.  CT HEAD WITHOUT CONTRAST  Technique:  Contiguous axial images were obtained from the base of the skull through the vertex without contrast.  Comparison: 08/25/2012.  Findings: Mild atrophy and chronic microvascular ischemic change. No acute cortical infarction or hemorrhage.  No mass lesion or hydrocephalus.  Right  thalamic and right putaminal lacunes.  Left middle cranial fossa arachnoid cyst. Vascular calcifications. Nasopharyngeal adenoidal hypertrophy or dependent nasopharyngeal fluid.  Calvarium intact.  No acute sinus or mastoid disease.  IMPRESSION: Chronic changes as described.  No acute intracranial findings. Stable appearance from priors.   Original Report Authenticated By: Davonna Belling, M.D.  ASSESSMENT: Atrial fibrillation, coronary artery disease  PLAN:  Atrial fibrillation:It appears he will need an antiarrhythmic to maintain sinus rhythm.  He previously felt when he went out of rhythm.  He stated this has happened many times over the past year.  Continue amiodarone load.  At the time of discharge, would send home on amiodarone 400 mg daily.  He will likely need Cardizem CD 360 mg daily.  He'll need metoprolol 50 mg by mouth twice a day.  Anticoagulation: GI workup in progress.  Anticoagulation will depend on results from EGD.     Left sided abdominal pain.  No rebound or  guarding.  Watch for any sign of bleeding there.  Have to consider rectus sheath hematoma given increased INR if no other source of anemia discovered.   Psych consult reveals that he has intact cognition.  Mental status significantly better today.  Corky Crafts., MD  10/01/2012  1:36 PM

## 2012-10-01 NOTE — Progress Notes (Signed)
Ordering INR today.  EGD for evaluation of anemia, hemoccult-positive stool, dark stool, when INR < 2, hopefully tomorrow.

## 2012-10-01 NOTE — Progress Notes (Signed)
Patient given [facility] Consult notes on signs and symptoms of hyperglycemia and hypoglycemia. Will continue to monitor.

## 2012-10-01 NOTE — Progress Notes (Signed)
[facility] PROGRESS NOTE  [name]:[id number] DOB: [birth year] DOA: 09/19/2012 PCP: No primary provider on file.  Brief narrative: DHILAN Joseph is an 72 y.o. male with a past medical history of recent aspiration pneumonia, atrial fibrillation with rapid ventricular response, COPD, and diabetes who initially presented to [facility] on 09/19/2012 with altered mental status requiring intubation for airway protection. He subsequently was transferred to [facility] under the care of the critical care service. He was successfully extubated on 09/20/2012 after being pan cultured and treated with broad-spectrum antibiotics.  Assessment/Plan: Principal Problem:   Acute respiratory failure secondary to pulmonary edema and community-acquired pneumonia / history of obstructive sleep apnea and COPD -Intubated 4/12-4/13. -Continue CPAP each bedtime. Continue albuterol as needed. -Continue oxygen to keep sats greater than 92%. -Finished a course of empiric antibiotics which were narrowed to Rocephin. Initially on vancomycin/Zosyn. -Diuresis as tolerated. Active Problems:   Atrial fibrillation with rapid ventricular response -Cardiology following and adjusting rate/rhythm control medications. -Continue amiodarone , Cardizem, and metoprolol. -Anticoagulation on hold while evaluating source of anemia.   CAD (coronary artery disease), native coronary artery -No evidence of acute coronary syndrome.   IDDM (insulin dependent diabetes mellitus) -Continue SSI. CBGs 115-158.   Hypertension associated with diabetes / hypertensive emergency with acute pulmonary edema -Blood pressure currently controlled on antihypertensives.   HCAP (healthcare-associated pneumonia) -Initially treated with broad-spectrum antibiotics, antibiotics subsequently narrowed to Rocephin and discontinued.   COPD (chronic obstructive pulmonary disease) -Respiratory status stable. Continue albuterol when necessary.    Coagulopathy -Secondary to Coumadin for chronic atrial fibrillation. -INR persistently elevated despite Coumadin being on hold. Reversed with 1 mg of vitamin K given 09/30/2012.   History of hepatitis -No elevation of LFTs noted.    Status post nephrectomy -Renal function stable.   Acute encephalopathy -Continue to monitor his mental status, which remains at baseline. -Status post psychiatric evaluation on 09/25/2012: Delirium with visual hallucinations. 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 Monitor. -CT of the head was negative for acute findings. MRI showed chronic ischemic changes. -Felt to have capacity per psychiatric evaluation.   Acute kidney injury -Creatinine improved with hydration.   Normocytic anemia / Hemoccult + stools (in the setting of supratherapeutic INR) -Hemoglobin has dropped steadily over the course of his hospital stay. -B12 685, folic acid 10.8, ferritin 48, iron low at 13 with a total iron binding capacity of 321 and a urine iron binding capacity of 308. Reticulocyte count is low for degree of anemia. These findings are most suggestive of anemia of chronic disease. -Continue B12 supplementation therapy. -Fecal occult blood testing was positive.  -Continue PPI therapy. -Seen by Dr. Dulce Sellar on 09/30/2012 with plans to pursue EGD 10/02/12.  Code Status: Full. Family Communication: Visitor at bedside today. Disposition Plan: Home when stable.   Medical Consultants:  Dr. Lance Muss, Cardiology  Dr. Darrol Jump, Psychiatry  Dr. Rory Percy, PCCM  Other Consultants:  Occupational therapy: Home OT  Anti-infectives:  Vancomycin 4/12 >>4/14   Zosyn 4/12 >>4/12   Ceftriaxone 4/14>>>4/16    HPI/Subjective: Jess Barters continues to be without complaint today. He denies chest pain, dyspnea, cough. Bowels are moving. Appetite is good.  No  black or bloody stools.  Did have some left lower heartburn abdominal pain to palpation, but otherwise no pain.  Objective: Filed Vitals:   09/30/12 1345 09/30/12 2057 09/30/12 2250 10/01/12 0447  BP: 146/78  137/67  128/65  Pulse: 77 70 72 69  Temp: 98.7 F (37.1 C) 98.4 F (36.9 C)  97.2 F (36.2 C)  TempSrc: Oral Oral  Oral  Resp: 18 18 18 20   Height:      Weight:    101.696 kg (224 lb 3.2 oz)  SpO2: 93% 96% 97% 96%    Intake/Output Summary (Last 24 hours) at 10/01/12 0951 Last data filed at 10/01/12 4098  Gross per 24 hour  Intake    840 ml  Output   1301 ml  Net   -461 ml    Exam: Gen:  NAD Cardiovascular:  RRR, No M/R/G Respiratory:  Lungs CTAB, diminished Gastrointestinal:  Abdomen soft, NT/ND, + BS Extremities:  Trace edema  Data Reviewed: Basic Metabolic Panel:  Recent Labs Lab 09/25/12 0656 09/26/12 0645 09/27/12 0505 09/28/12 0530  NA 141 143 139 138  K 3.8 4.0 4.3 4.8  CL 106 105 104 105  CO2 27 28 28 28   GLUCOSE 94 108* 113* 105*  BUN 14 17 20 18   CREATININE 1.27 1.37* 1.44* 1.35  CALCIUM 9.0 9.1 8.6 8.4   GFR Estimated Creatinine Clearance: 58.7 ml/min (by C-G formula based on Cr of 1.35).  Coagulation profile  Recent Labs Lab 09/25/12 0656 09/26/12 0645 09/27/12 0505 09/29/12 1006 10/01/12 0900  INR 2.72* 2.60* 2.87* 3.47* 1.39    CBC:  Recent Labs Lab 09/27/12 0505 09/28/12 0530 09/29/12 0515 09/30/12 0550 10/01/12 0510  WBC 6.5 6.7 5.5 5.2 5.4  HGB 8.1* 7.9* 7.5* 7.5* 8.4*  HCT 25.8* 25.2* 23.7* 24.2* 27.1*  MCV 76.3* 76.6* 75.5* 75.4* 75.5*  PLT 219 214 206 257 317   BNP (last 3 results)  Recent Labs  08/21/12 1219 09/19/12 1300 09/20/12 0527  PROBNP 516.5* 681.2* 796.8*   CBG:  Recent Labs Lab 09/30/12 0633 09/30/12 1122 09/30/12 1628 09/30/12 2053 10/01/12 0556  GLUCAP 158* 151* 115* 116* 121*   Anemia work up  Recent Labs  09/28/12 2225  VITAMINB12 685  FOLATE 10.8  FERRITIN 48  TIBC 321   IRON 13*  RETICCTPCT 1.5   Microbiology No results found for this or any previous visit (from the past 240 hour(s)).   Procedures and Diagnostic Studies: Dg Chest 2 View  09/04/2012  *RADIOLOGY REPORT*  Clinical Data: Assess infiltrates  CHEST - 2 VIEW  Comparison: 09/03/2012  Findings: Right-sided PICC line is again seen.  Bilateral infiltrates are again noted and stable.  Cardiac shadow is stable.  IMPRESSION: Stable bilateral infiltrative change   Original Report Authenticated By: Alcide Clever, M.D.    Dg Chest 2 View  09/03/2012  *RADIOLOGY REPORT*  Clinical Data: Previously seen infiltrate, check for resolution  CHEST - 2 VIEW  Comparison: 09/01/2012  Findings: The cardiac shadow is again enlarged.  Bilateral infiltrates are again seen and stable.  The right-sided PICC line is again noted.  No new focal abnormality is seen.  IMPRESSION: Stable bilateral infiltrates   Original Report Authenticated By: Alcide Clever, M.D.    Ct Head Wo Contrast  09/24/2012  *RADIOLOGY REPORT*  Clinical Data: Head trauma secondary to a fall.  Scalp contusion.  CT HEAD WITHOUT CONTRAST  Technique:  Contiguous axial images were obtained from the base of the skull through the vertex without contrast.  Comparison: CT scan dated 09/19/2012  Findings: There is no acute intracranial hemorrhage, infarction, or mass lesion.  There are old lacunar infarcts in the right basal ganglia.  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  Osseous structures are normal.  IMPRESSION: No acute abnormalities.   Original Report Authenticated By: Francene Boyers, M.D.    Ct Head Wo Contrast  09/19/2012  *RADIOLOGY REPORT*  Clinical Data: Altered level of consciousness.  Atrial fibrillation.  Insulin dependent diabetes.  Hypertension.  CT HEAD WITHOUT CONTRAST  Technique:  Contiguous axial images were obtained from the base of the skull through the vertex without contrast.  Comparison: 08/25/2012.  Findings: Mild atrophy and  chronic microvascular ischemic change. No acute cortical infarction or hemorrhage.  No mass lesion or hydrocephalus.  Right  thalamic and right putaminal lacunes.  Left middle cranial fossa arachnoid cyst. Vascular calcifications. Nasopharyngeal adenoidal hypertrophy or dependent nasopharyngeal fluid.  Calvarium intact.  No acute sinus or mastoid disease.  IMPRESSION: Chronic changes as described.  No acute intracranial findings. Stable appearance from priors.   Original Report Authenticated By: Davonna Belling, M.D.    Mr Brain Wo Contrast  09/24/2012  *RADIOLOGY REPORT*  Clinical Data: Confusion.  Encephalopathy.  MRI HEAD WITHOUT CONTRAST  Technique:  Multiplanar, multiecho pulse sequences of the brain and surrounding structures were obtained according to standard protocol without intravenous contrast.  Comparison: CT 09/19/2012  Findings: Negative for acute infarct.  Generalized atrophy.  Chronic ischemic changes are present in the white matter.  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  Small chronic infarct right posterior cerebellum.  Arachnoid cyst anterior to the left temporal lobe measuring 25 x 30 mm.  Negative for intracranial hemorrhage.  Negative for mass lesion.  IMPRESSION: Chronic ischemic changes.  No acute infarct.   Original Report Authenticated By: Janeece Riggers, M.D.    Dg Chest Port 1 View  09/20/2012  *RADIOLOGY REPORT*  Clinical Data: Ventilator dependent respiratory failure.  Follow up CHF.  PORTABLE CHEST - 1 VIEW 09/20/2012 0537 hours:  Comparison: Portable chest x-ray yesterday and 09/01/2012  Findings: Endotracheal tube tip in satisfactory position approximately 5 cm above carina.  Nasogastric tube courses below the diaphragm into the stomach.  Cardiac silhouette enlarged but stable.  Interval slight improvement in the interstitial and airspace pulmonary edema, though moderate edema persist.  New dense atelectasis in both lower lobes.  IMPRESSION: Support apparatus  satisfactory.  Slight improvement in CHF, though moderate interstitial and airspace edema persists.  New dense bilateral lower lobe atelectasis.  Cough,   Original Report Authenticated By: Hulan Saas, M.D.    Dg Chest Portable 1 View  09/19/2012  *RADIOLOGY REPORT*  Clinical Data: Shortness of breath and post intubation.  PORTABLE CHEST - 1 VIEW  Comparison: 09/04/2012  Findings: Endotracheal tube is 6.3 cm above the carina. Nasogastric tube extends into the abdomen.  Again noted are diffuse interstitial densities and possibly airspace disease in the right lower lung.  There is a cardiac pad overlying the left upper chest. Stable appearance of the cardiomediastinal silhouette.  IMPRESSION: Support apparatuses as described.  Diffuse interstitial lung densities.  Findings could represent interstitial pulmonary edema but infection cannot be excluded.   Original Report Authenticated By: Richarda Overlie, M.D.    Dg Chest Port 1 View  09/01/2012  *RADIOLOGY REPORT*  Clinical Data: PICC line placement.  PORTABLE CHEST - 1 VIEW  Comparison: Same date.  Findings: Interval placement of right-sided PICC line with distal tip in the expected position of the right atrium.  Stable mild cardiomegaly is noted.  Bilateral airspace opacities are noted concerning for pneumonia or edema.  Bony thorax is intact.  IMPRESSION:  Continued presence of bilateral lung opacities concerning for pneumonia or edema.  Right-sided PICC line has been placed with distal tip in expected position of right atrium; it is recommended to be withdrawn at least 2 cm.   Original Report Authenticated By: Lupita Raider.,  M.D.    [facility] 1 View  09/01/2012  *RADIOLOGY REPORT*  Clinical Data: Pneumonia.  PORTABLE CHEST - 1 VIEW  Comparison: Chest x-ray 08/31/2012.  Findings: Lung volumes are low.  Severe multifocal interstitial and airspace disease is noted throughout all aspects of the lungs bilaterally, increased compared with yesterday's  examination. There may be small bilateral pleural effusions, however, the costophrenic sulci are excluded from the lower margin of the image. Cardiomegaly.  Cephalization of the pulmonary vasculature. The patient is rotated to the right on today's exam, resulting in distortion of the mediastinal contours and reduced diagnostic sensitivity and specificity for mediastinal pathology. Atherosclerosis of the thoracic aorta.  IMPRESSION: 1.  Persistent multifocal bilateral interstitial and airspace disease.  Given the cardiomegaly and cephalization of the pulmonary vasculature, findings are favored to reflect severe pulmonary edema related to congestive heart failure, however, superimposed multilobar pneumonia is difficult to exclude.   Original Report Authenticated By: Trudie Reed, M.D.    Dg Chest Port 1 View  08/31/2012  *RADIOLOGY REPORT*  Clinical Data: Pneumonia  PORTABLE CHEST - 1 VIEW  Comparison: 08/28/2012  Findings: Cardiac shadow remains mildly enlarged.  Patchy bilateral infiltrative changes are again seen.  Given some technical variations of the films there likely is stable.  Abnormality is seen.  IMPRESSION: Stable bilateral infiltrates.   Original Report Authenticated By: Alcide Clever, M.D.     Scheduled Meds: . amiodarone  400 mg Oral BID  . diltiazem  360 mg Oral Daily  . insulin aspart  0-20 Units Subcutaneous TID AC & HS  . methadone  5 mg Oral Daily  . metoprolol tartrate  50 mg Oral BID  . pantoprazole  40 mg Oral Daily  . polyethylene glycol  17 g Oral Daily  . QUEtiapine  50 mg Oral QHS   Continuous Infusions:   Time spent: 25 minutes.   LOS: 12 days   Attikus Bartoszek  [facility] Pager [phone].  If 8PM-8AM, please contact night-coverage at [email or web address], password [facility] 10/01/2012, 9:51 AM

## 2012-10-01 NOTE — Progress Notes (Signed)
Occupational Therapy Treatment Patient Details Name: [name] MRN: [id number] DOB: Feb 28, 1941 Today's Date: 10/01/2012 Time: 4098-1191 OT Time Calculation (min): 17 min  OT Assessment / Plan / Recommendation Comments on Treatment Session Pt making steady improvement. Feel pt will be able to D/C home with S. Pt will benefit from tub bench to use at home to decrease falls. O2 sats >93 during activity,. dyspnea 2/4. Pt will benefit from [facility] after D/C.    Follow Up Recommendations  Home health OT    Barriers to Discharge       Equipment Recommendations  Tub/shower bench    Recommendations for Other Services  REC HHSW  Frequency Min 2X/week   Plan Discharge plan remains appropriate    Precautions / Restrictions Precautions Precautions: Fall (Simultaneous filing. User may not have seen previous data.) Restrictions Weight Bearing Restrictions: No   Pertinent Vitals/Pain O2 SATS >93 on RA during activity. Dyspnea 2/4 no c/o pain    ADL  Grooming: Supervision/safety Where Assessed - Grooming: Unsupported standing Lower Body Dressing: Supervision/safety Where Assessed - Lower Body Dressing: Unsupported sit to stand Toilet Transfer: Supervision/safety Toilet Transfer Method: Other (comment) (ambulating) Equipment Used: Gait belt Transfers/Ambulation Related to ADLs: S ADL Comments: Makign progress. would benefit from E conservation    OT Diagnosis:    OT Problem List:   OT Treatment Interventions:     OT Goals Acute Rehab OT Goals OT Goal Formulation: With patient Time For Goal Achievement: 10/08/12 Potential to Achieve Goals: Good ADL Goals Pt Will Perform Grooming: with modified independence;Standing at sink ADL Goal: Grooming - Progress: Progressing toward goals Pt Will Perform Upper Body Bathing: with modified independence;Sit to stand from chair ADL Goal: Upper Body Bathing - Progress: Progressing toward goals Pt Will Perform Lower Body Bathing: with modified  independence;Sit to stand from chair ADL Goal: Lower Body Bathing - Progress: Progressing toward goals Pt Will Perform Upper Body Dressing: with modified independence;Sitting, chair ADL Goal: Upper Body Dressing - Progress: Progressing toward goals Pt Will Perform Lower Body Dressing: with modified independence;Sit to stand from chair ADL Goal: Lower Body Dressing - Progress: Progressing toward goals Pt Will Transfer to Toilet: with modified independence ADL Goal: Toilet Transfer - Progress: Progressing toward goals Pt Will Perform Toileting - Clothing Manipulation: with modified independence ADL Goal: Toileting - Clothing Manipulation - Progress: Progressing toward goals Pt Will Perform Toileting - Hygiene: with modified independence ADL Goal: Toileting - Hygiene - Progress: Progressing toward goals  Visit Information  Last OT Received On: 10/01/12 Assistance Needed: +1 (Simultaneous filing. User may not have seen previous data.)    Subjective Data      Prior Functioning       Cognition  Cognition Arousal/Alertness: Awake/alert (Simultaneous filing. User may not have seen previous data.) Behavior During Therapy: [facility] for tasks assessed/performed (Simultaneous filing. User may not have seen previous data.) Overall Cognitive Status:  (apparent STM deficits) General Comments: Pt demonstrates significant improvement sin cognition today (Simultaneous filing. User may not have seen previous data.)    Mobility  Bed Mobility Bed Mobility: Not assessed (Simultaneous filing. User may not have seen previous data.) Supine to Sit: 6: Modified independent (Device/Increase time) Transfers Transfers: Sit to Stand;Stand to Sit Sit to Stand: 5: Supervision (Simultaneous filing. User may not have seen previous data.) Stand to Sit: 5: Supervision (Simultaneous filing. User may not have seen previous data.) Details for Transfer Assistance: VCs for hand placement (Simultaneous filing. User may not  have seen previous data.)  Exercises      Balance Static Standing Balance Static Standing - Balance Support: During functional activity;Left upper extremity supported;Right upper extremity supported Static Standing - Level of Assistance: 5: Stand by assistance   End of Session OT - End of Session Equipment Utilized During Treatment: Gait belt Activity Tolerance: Patient tolerated treatment well;Patient limited by fatigue Patient left: in chair;with call bell/phone within reach Nurse Communication: Mobility status  GO     Kerby Borner,HILLARY 10/01/2012, 12:16 PM [facility], OTR/L  [phone] 10/01/2012

## 2012-10-02 ENCOUNTER — Encounter (HOSPITAL_COMMUNITY): Payer: Self-pay | Admitting: *Deleted

## 2012-10-02 ENCOUNTER — Encounter (HOSPITAL_COMMUNITY)
Admission: EM | Disposition: A | Discharge status: Home / Self Care | Payer: Self-pay | Source: Home / Self Care | Attending: Internal Medicine

## 2012-10-02 HISTORY — PX: ESOPHAGOGASTRODUODENOSCOPY: SHX5428

## 2012-10-02 LAB — CBC
HCT: 26.9 % — ABNORMAL LOW (ref 39.0–52.0)
MCV: 75.1 fL — ABNORMAL LOW (ref 78.0–100.0)
Platelets: 309 10*3/uL (ref 150–400)
RBC: 3.58 MIL/uL — ABNORMAL LOW (ref 4.22–5.81)
WBC: 5.8 10*3/uL (ref 4.0–10.5)

## 2012-10-02 LAB — GLUCOSE, CAPILLARY
Glucose-Capillary: 100 mg/dL — ABNORMAL HIGH (ref 70–99)
Glucose-Capillary: 113 mg/dL — ABNORMAL HIGH (ref 70–99)
Glucose-Capillary: 149 mg/dL — ABNORMAL HIGH (ref 70–99)
Glucose-Capillary: 152 mg/dL — ABNORMAL HIGH (ref 70–99)

## 2012-10-02 SURGERY — EGD (ESOPHAGOGASTRODUODENOSCOPY)
Anesthesia: Moderate Sedation | Laterality: Left

## 2012-10-02 MED ORDER — FENTANYL CITRATE 0.05 MG/ML IJ SOLN
INTRAMUSCULAR | Status: DC | PRN
  Administered 2012-10-02: 10 ug via INTRAVENOUS
  Administered 2012-10-02: 15 ug via INTRAVENOUS
  Administered 2012-10-02: 25 ug via INTRAVENOUS

## 2012-10-02 MED ORDER — PEG 3350-KCL-NA BICARB-NACL 420 G PO SOLR
4000.0000 mL | Freq: Once | ORAL | Status: AC
  Administered 2012-10-02: 4000 mL via ORAL
  Filled 2012-10-02: qty 4000

## 2012-10-02 MED ORDER — BISACODYL 5 MG PO TBEC
10.0000 mg | DELAYED_RELEASE_TABLET | Freq: Once | ORAL | Status: AC
  Administered 2012-10-02: 10 mg via ORAL
  Filled 2012-10-02 (×2): qty 2

## 2012-10-02 MED ORDER — FENTANYL CITRATE 0.05 MG/ML IJ SOLN
INTRAMUSCULAR | Status: AC
  Filled 2012-10-02: qty 2

## 2012-10-02 MED ORDER — MIDAZOLAM HCL 5 MG/ML IJ SOLN
INTRAMUSCULAR | Status: AC
  Filled 2012-10-02: qty 2

## 2012-10-02 MED ORDER — MIDAZOLAM HCL 10 MG/2ML IJ SOLN
INTRAMUSCULAR | Status: DC | PRN
  Administered 2012-10-02: 2 mg via INTRAVENOUS

## 2012-10-02 MED ORDER — SODIUM CHLORIDE 0.9 % IV SOLN
INTRAVENOUS | Status: DC
  Administered 2012-10-02: 10:00:00 via INTRAVENOUS

## 2012-10-02 MED ORDER — PEG 3350-KCL-NA BICARB-NACL 420 G PO SOLR
4000.0000 mL | Freq: Once | ORAL | Status: AC | PRN
  Filled 2012-10-02: qty 4000

## 2012-10-02 MED ORDER — ONDANSETRON HCL 4 MG/2ML IJ SOLN
4.0000 mg | Freq: Four times a day (QID) | INTRAMUSCULAR | Status: DC | PRN
  Administered 2012-10-02: 4 mg via INTRAVENOUS
  Filled 2012-10-02 (×2): qty 2

## 2012-10-02 MED ORDER — BUTAMBEN-TETRACAINE-BENZOCAINE 2-2-14 % EX AERO
INHALATION_SPRAY | CUTANEOUS | Status: DC | PRN
  Administered 2012-10-02: 2 via TOPICAL

## 2012-10-02 NOTE — Op Note (Signed)
[facility] [address] [place], [address]   ENDOSCOPY PROCEDURE REPORT  PATIENT: [name], [name]  MR#: [id number] BIRTHDATE: [birth year] , 71  yrs. old GENDER: Male ENDOSCOPIST: Willis Modena, MD REFERRED BY:  [facility] PROCEDURE DATE:  10/02/2012 PROCEDURE:  EGD, diagnostic ASA CLASS:     Class III INDICATIONS:  melena, anemia. MEDICATIONS: Fentanyl 50 mcg IV and Versed 2 mg IV TOPICAL ANESTHETIC: Cetacaine Spray  DESCRIPTION OF PROCEDURE: After the risks benefits and alternatives of the procedure were thoroughly explained, informed consent was obtained.  The diagnostic forward-viewing      endoscope was introduced through the mouth and advanced to the second portion of the duodenum. Without limitations.  The instrument was slowly withdrawn as the mucosa was fully examined.    Findings:  Normal esophagus.  Normal stomach and pylorus.  Normal duodenum to the second portion.  No ulcer, mass, AVM or other pathology seen.  No old or fresh blood was seen.            The scope was then withdrawn from the patient and the procedure completed.  ENDOSCOPIC IMPRESSION:     Normal endoscopy.  No source of anemia or melena was identified.  RECOMMENDATIONS:     1.  Watch for potential complications of procedure. 2.  Colonoscopy tomorrow. 3.  If colonoscopy is unrevealing, might consider capsule endoscopy. 4.  [facility] inpatient team will follow.  eSigned:  Willis Modena, MD 10/02/2012 11:14 AM   CC:

## 2012-10-02 NOTE — Interval H&P Note (Signed)
History and Physical Interval Note:  10/02/2012 10:47 AM  [name]  has presented today for surgery, with the diagnosis of Melena, anemia  The various methods of treatment have been discussed with the patient and family. After consideration of risks, benefits and other options for treatment, the patient has consented to  Procedure(s): ESOPHAGOGASTRODUODENOSCOPY (EGD) (Left) as a surgical intervention .  The patient's history has been reviewed, patient examined, no change in status, stable for surgery.  I have reviewed the patient's chart and labs.  Questions were answered to the patient's satisfaction.     Kahlyn Shippey M  Assessment:  1.  Anemia, melena, in setting of anticoagulation.  Plan:  1.  Endoscopy. 2.  Risks (bleeding, infection, bowel perforation that could require surgery, sedation-related changes in cardiopulmonary systems), benefits (identification and possible treatment of source of symptoms, exclusion of certain causes of symptoms), and alternatives (watchful waiting, radiographic imaging studies, empiric medical treatment) of upper endoscopy (EGD) were explained to patient in detail and patient wishes to proceed.

## 2012-10-02 NOTE — Progress Notes (Signed)
[facility]  PROGRESS NOTE  Brief narrative:  [name] is an 72 y.o. male with a past medical history of recent aspiration pneumonia, atrial fibrillation with RVR, COPD, and diabetes who initially presented to [facility] on 09/19/2012 with altered mental status requiring intubation for airway protection. He subsequently was transferred to [facility] [place] under the care of the critical care service. He was successfully extubated on 09/20/2012 after being pan cultured and treated with broad-spectrum antibiotics for pneumonia.   Assessment/Plan: Principal Problem:  Acute respiratory failure secondary to pulmonary edema and community-acquired pneumonia / history of obstructive sleep apnea and COPD  - He was Intubated 4/12-4/13.  - Continue CPAP each bedtime. Continue albuterol as needed. - Finished a course of empiric antibiotics which were narrowed to Rocephin. Initially on vancomycin/Zosyn.  Lungs are improved, he is not requiring oxygen  - Continue oxygen prn to keep sats greater than 92%.  - Likely resolved  Active Problems:  Atrial fibrillation with rapid ventricular response  - Cardiology following and adjusting rate/rhythm control medications.  - Continue PO amiodarone , Cardizem, and metoprolol. He is on suggested doses from Cardiology and will likely continue on these as outpatient - Anticoagulation on hold during workup for anemia, EGD today  Normocytic anemia / Hemoccult + stools (in the setting of supratherapeutic INR)  - Hemoglobin has dropped steadily over the course of his hospital stay, but has remained stable around 8 during the past 2 days  - Lab findings are most suggestive of anemia of chronic disease with concomitant iron deficiency.  - Continue B12 supplementation therapy.  - Fecal occult blood testing was positive.  - Continue PPI therapy.  - GI following; Seen by Dr. Dulce Sellar on 09/30/2012.  Plan for EGD today  CAD (coronary artery disease), native coronary artery  -No  evidence of ACS at this time, chest pain free  IDDM (insulin dependent diabetes mellitus)  -Continue SSI.  - He is on lantus, 40 units at bedtime at home.  Once he is eating regularly, can restart this medication.   Hypertension associated with diabetes / hypertensive emergency with acute pulmonary edema  - Blood pressure currently controlled on antihypertensives.  However, A little elevated today to 151/74 - He also has listed clonidine and tamsulosin on his home meds, will need to confirm before restarting, however, if his BP is stable on above regimen likely will not need to restart clonidine.    HCAP (healthcare-associated pneumonia)  -  Resolved - Initially treated with broad-spectrum antibiotics, antibiotics subsequently narrowed to Rocephin and discontinued.   COPD (chronic obstructive pulmonary disease)  - Stable, not requiring O2 at this time, no wheezing on exam. Continue albuterol when necessary.   Coagulopathy  - Secondary to Coumadin for chronic atrial fibrillation.  - INR persistently elevated despite Coumadin being on hold. Reversed with 1 mg of vitamin K given 09/30/2012.  - INR improved 10/02/12 for EGD  Status post nephrectomy/Acute kidney injury  -Renal function improved and stable  Acute encephalopathy  - Improved, alert and oriented today - Status post psychiatric evaluation on 09/25/2012: Delirium with visual hallucinations, patient felt to have capacity - Limit narcotics if possible - on methadone and fentanyl prn - Seroquel was increased was previously increased during this stay.     Code Status: Full.  Family Communication: Updated patient, no family at bedside.  Disposition Plan: Home when stable.   Medical Consultants:  Dr. Lance Muss, Cardiology  Dr. Darrol Jump, Psychiatry  Dr. Rory Percy, PCCM Dr.  Outlaw, GI Other Consultants:  Occupational therapy: Home OT Anti-infectives:  Vancomycin 4/12 >>4/14  Zosyn 4/12 >>4/12   Ceftriaxone 4/14>>>4/16    Subjective: [name] reports that he is doing well.  His breathing is the "best it's been in a long time."  He has no complaints.  He has no confusion, alert and oriented.  He tells me that he was told many years ago that he had a "hole in his stomach."  He was supposed to have it fixed around the time he was treated for a cancer (renal cancer?), however, he never had anything done about it.   Objective: Vital signs in last 24 hours: Temp:  [97.4 F (36.3 C)-98.6 F (37 C)] 97.4 F (36.3 C) (04/25 0620) Pulse Rate:  [67-75] 70 (04/25 0620) Resp:  [18] 18 (04/25 0620) BP: (112-139)/(62-69) 139/69 mmHg (04/25 0620) SpO2:  [96 %-98 %] 96 % (04/25 0620) Weight:  [224 lb 6.9 oz (101.8 kg)] 224 lb 6.9 oz (101.8 kg) (04/25 0620) Weight change: 3.7 oz (0.103 kg) Last BM Date: 10/02/12  Intake/Output from previous day: 04/24 0701 - 04/25 0700 In: 720 [P.O.:720] Out: 1926 [Urine:1925; Stool:1] Intake/Output this shift:    General appearance: alert, cooperative and appears stated age Head: Normocephalic, without obvious abnormality, atraumatic Eyes: EOMI, anicteric sclerae Back: symmetric, no ttp over back Resp: clear to auscultation bilaterally and minimal rhonchi at bases Cardio: irreg irreg, rate controlled, no murmur GI: soft, NT, ND, +BS Extremities: thin, no edema, missing digit on the left hand, fourth finger Pulses: 2+ and symmetric  Lab Results:  Recent Labs  10/01/12 0510 10/02/12 0510  WBC 5.4 5.8  HGB 8.4* 8.3*  HCT 27.1* 26.9*  PLT 317 309   BMET No results found for this basename: NA, K, CL, CO2, GLUCOSE, BUN, CREATININE, CALCIUM,  in the last 72 hours  Studies/Results: No results found.  Medications: I have reviewed the patient's current medications.    LOS: 13 days   [name] 10/02/2012, 8:16 AM

## 2012-10-02 NOTE — Progress Notes (Signed)
PT Cancellation Note  Patient Details Name: [name] MRN: [id number] DOB: 1940/08/31   Cancelled Treatment:    Reason Eval/Treat Not Completed: Medical issues which prohibited therapy;Fatigue/lethargy limiting ability to participate.  Attempted to see patient x3 today.  Patient first was at procedure, then c/o fatigue.  On last attempt, patient drinking prep for colonoscopy and declined ambulation.  Will attempt to see tomorrow.   Vena Austria 10/02/2012, 6:02 PM [phone]

## 2012-10-02 NOTE — Progress Notes (Signed)
SUBJECTIVE:    Currently, Maintaining normal sinus rhythm on amiodarone, Cardizem, metoprolol.  Heme positive stool.  Negative EGD today.  OBJECTIVE:   Vitals:   Filed Vitals:   10/02/12 1125 10/02/12 1130 10/02/12 1135 10/02/12 1300  BP: 147/87 147/81 147/81 135/62  Pulse:    75  Temp:    98.1 F (36.7 C)  TempSrc:    Oral  Resp: 21 19 13 18   Height:      Weight:      SpO2: 98% 94% 95% 99%   I&O's:    Intake/Output Summary (Last 24 hours) at 10/02/12 1356 Last data filed at 10/02/12 1230  Gross per 24 hour  Intake    600 ml  Output   1875 ml  Net  -1275 ml   TELEMETRY: Reviewed telemetry pt in normal sinus rhythm with bundle branch block:     PHYSICAL EXAM General: Well developed, well nourished, sweating Head: Normal cephalic and atramatic  Lungs:   Clear bilaterally to auscultation and percussion. Heart:  HRRR S1 S2  Abdomen: Mild left sided abdominal tenderness. Msk:  . Normal strength and tone for age. Extremities:   No  edema.   Neuro: Alert and oriented X 3. Psych:  Normal affect, responds appropriately   LABS: Basic Metabolic Panel: No results found for this basename: NA, K, CL, CO2, GLUCOSE, BUN, CREATININE, CALCIUM, MG, PHOS,  in the last 72 hours Liver Function Tests: No results found for this basename: AST, ALT, ALKPHOS, BILITOT, PROT, ALBUMIN,  in the last 72 hours No results found for this basename: LIPASE, AMYLASE,  in the last 72 hours CBC:  Recent Labs  10/01/12 0510 10/02/12 0510  WBC 5.4 5.8  HGB 8.4* 8.3*  HCT 27.1* 26.9*  MCV 75.5* 75.1*  PLT 317 309   Cardiac Enzymes: No results found for this basename: CKTOTAL, CKMB, CKMBINDEX, TROPONINI,  in the last 72 hours BNP: No components found with this basename: POCBNP,  D-Dimer: No results found for this basename: DDIMER,  in the last 72 hours Hemoglobin A1C: No results found for this basename: HGBA1C,  in the last 72 hours Fasting Lipid Panel: No results found for this basename:  CHOL, HDL, LDLCALC, TRIG, CHOLHDL, LDLDIRECT,  in the last 72 hours Thyroid Function Tests: No results found for this basename: TSH, T4TOTAL, FREET3, T3FREE, THYROIDAB,  in the last 72 hours Anemia Panel: No results found for this basename: VITAMINB12, FOLATE, FERRITIN, TIBC, IRON, RETICCTPCT,  in the last 72 hours Coag Panel:   Lab Results  Component Value Date   INR 1.39 10/01/2012   INR 3.47* 09/29/2012   INR 2.87* 09/27/2012    09/19/2012  *RADIOLOGY REPORT*  Clinical Data: Altered level of consciousness.  Atrial fibrillation.  Insulin dependent diabetes.  Hypertension.  CT HEAD WITHOUT CONTRAST  Technique:  Contiguous axial images were obtained from the base of the skull through the vertex without contrast.  Comparison: 08/25/2012.  Findings: Mild atrophy and chronic microvascular ischemic change. No acute cortical infarction or hemorrhage.  No mass lesion or hydrocephalus.  Right  thalamic and right putaminal lacunes.  Left middle cranial fossa arachnoid cyst. Vascular calcifications. Nasopharyngeal adenoidal hypertrophy or dependent nasopharyngeal fluid.  Calvarium intact.  No acute sinus or mastoid disease.  IMPRESSION: Chronic changes as described.  No acute intracranial findings. Stable appearance from priors.   Original Report Authenticated By: Davonna Belling, M.D.       ASSESSMENT: Atrial fibrillation, coronary artery disease  PLAN:  Atrial fibrillation:It appears  he will need an antiarrhythmic to maintain sinus rhythm.  He previously felt when he went out of rhythm.  He stated this has happened many times over the past year.  Continue amiodarone load.  At the time of discharge, would send home on amiodarone 400 mg daily.  He will likely need Cardizem CD 360 mg daily.  He'll need metoprolol 50 mg by mouth twice a day.  Anticoagulation: GI workup in progress of anemia.  Anticoagulation will depend on results from colonoscopy +/- capsule endoscopy.  EGD negative.   Left sided abdominal  pain a few days ago.  He is not complaining today.  He did Not have rebound or guarding.  Watch for any sign of bleeding there.  Have to consider rectus sheath hematoma given increased INR if no other source of anemia discovered.   Psych consult reveals that he has intact cognition.  Mental status significantly better today.  Corky Crafts., MD  10/02/2012  1:56 PM

## 2012-10-02 NOTE — H&P (View-Only) (Signed)
[facility]  PROGRESS NOTE  Brief narrative:  [name] is an 72 y.o. male with a past medical history of recent aspiration pneumonia, atrial fibrillation with RVR, COPD, and diabetes who initially presented to [facility] on 09/19/2012 with altered mental status requiring intubation for airway protection. He subsequently was transferred to [place] under the care of the critical care service. He was successfully extubated on 09/20/2012 after being pan cultured and treated with broad-spectrum antibiotics for pneumonia.   Assessment/Plan: Principal Problem:  Acute respiratory failure secondary to pulmonary edema and community-acquired pneumonia / history of obstructive sleep apnea and COPD  - He was Intubated 4/12-4/13.  - Continue CPAP each bedtime. Continue albuterol as needed. - Finished a course of empiric antibiotics which were narrowed to Rocephin. Initially on vancomycin/Zosyn.  Lungs are improved, he is not requiring oxygen  - Continue oxygen prn to keep sats greater than 92%.  - Likely resolved  Active Problems:  Atrial fibrillation with rapid ventricular response  - Cardiology following and adjusting rate/rhythm control medications.  - Continue PO amiodarone , Cardizem, and metoprolol. He is on suggested doses from Cardiology and will likely continue on these as outpatient - Anticoagulation on hold during workup for anemia, EGD today  Normocytic anemia / Hemoccult + stools (in the setting of supratherapeutic INR)  - Hemoglobin has dropped steadily over the course of his hospital stay, but has remained stable around 8 during the past 2 days  - Lab findings are most suggestive of anemia of chronic disease with concomitant iron deficiency.  - Continue B12 supplementation therapy.  - Fecal occult blood testing was positive.  - Continue PPI therapy.  - GI following; Seen by Dr. Outlaw on 09/30/2012.  Plan for EGD today  CAD (coronary artery disease), native coronary artery  -No  evidence of ACS at this time, chest pain free  IDDM (insulin dependent diabetes mellitus)  -Continue SSI.  - He is on lantus, 40 units at bedtime at home.  Once he is eating regularly, can restart this medication.   Hypertension associated with diabetes / hypertensive emergency with acute pulmonary edema  - Blood pressure currently controlled on antihypertensives.  However, A little elevated today to 151/74 - He also has listed clonidine and tamsulosin on his home meds, will need to confirm before restarting, however, if his BP is stable on above regimen likely will not need to restart clonidine.    HCAP (healthcare-associated pneumonia)  -  Resolved - Initially treated with broad-spectrum antibiotics, antibiotics subsequently narrowed to Rocephin and discontinued.   COPD (chronic obstructive pulmonary disease)  - Stable, not requiring O2 at this time, no wheezing on exam. Continue albuterol when necessary.   Coagulopathy  - Secondary to Coumadin for chronic atrial fibrillation.  - INR persistently elevated despite Coumadin being on hold. Reversed with 1 mg of vitamin K given 09/30/2012.  - INR improved 10/02/12 for EGD  Status post nephrectomy/Acute kidney injury  -Renal function improved and stable  Acute encephalopathy  - Improved, alert and oriented today - Status post psychiatric evaluation on 09/25/2012: Delirium with visual hallucinations, patient felt to have capacity - Limit narcotics if possible - on methadone and fentanyl prn - Seroquel was increased was previously increased during this stay.     Code Status: Full.  Family Communication: Updated patient, no family at bedside.  Disposition Plan: Home when stable.   Medical Consultants:  Dr. Jayadeep Varanasi, Cardiology  Dr. Janardhaha Jonnalagadda, Psychiatry  Dr. Daniel Feinstein, PCCM Dr.   Outlaw, GI Other Consultants:  Occupational therapy: Home OT Anti-infectives:  Vancomycin 4/12 >>4/14  Zosyn 4/12 >>4/12   Ceftriaxone 4/14>>>4/16    Subjective: [name] reports that he is doing well.  His breathing is the "best it's been in a long time."  He has no complaints.  He has no confusion, alert and oriented.  He tells me that he was told many years ago that he had a "hole in his stomach."  He was supposed to have it fixed around the time he was treated for a cancer (renal cancer?), however, he never had anything done about it.   Objective: Vital signs in last 24 hours: Temp:  [97.4 F (36.3 C)-98.6 F (37 C)] 97.4 F (36.3 C) (04/25 0620) Pulse Rate:  [67-75] 70 (04/25 0620) Resp:  [18] 18 (04/25 0620) BP: (112-139)/(62-69) 139/69 mmHg (04/25 0620) SpO2:  [96 %-98 %] 96 % (04/25 0620) Weight:  [224 lb 6.9 oz (101.8 kg)] 224 lb 6.9 oz (101.8 kg) (04/25 0620) Weight change: 3.7 oz (0.103 kg) Last BM Date: 10/02/12  Intake/Output from previous day: 04/24 0701 - 04/25 0700 In: 720 [P.O.:720] Out: 1926 [Urine:1925; Stool:1] Intake/Output this shift:    General appearance: alert, cooperative and appears stated age Head: Normocephalic, without obvious abnormality, atraumatic Eyes: EOMI, anicteric sclerae Back: symmetric, no ttp over back Resp: clear to auscultation bilaterally and minimal rhonchi at bases Cardio: irreg irreg, rate controlled, no murmur GI: soft, NT, ND, +BS Extremities: thin, no edema, missing digit on the left hand, fourth finger Pulses: 2+ and symmetric  Lab Results:  Recent Labs  10/01/12 0510 10/02/12 0510  WBC 5.4 5.8  HGB 8.4* 8.3*  HCT 27.1* 26.9*  PLT 317 309   BMET No results found for this basename: NA, K, CL, CO2, GLUCOSE, BUN, CREATININE, CALCIUM,  in the last 72 hours  Studies/Results: No results found.  Medications: I have reviewed the patient's current medications.    LOS: 13 days   Korryn Pancoast 10/02/2012, 8:16 AM   

## 2012-10-03 ENCOUNTER — Encounter (HOSPITAL_COMMUNITY)
Admission: EM | Disposition: A | Discharge status: Home / Self Care | Payer: Self-pay | Source: Home / Self Care | Attending: Internal Medicine

## 2012-10-03 ENCOUNTER — Encounter (HOSPITAL_COMMUNITY): Payer: Self-pay

## 2012-10-03 DIAGNOSIS — D649 Anemia, unspecified: Secondary | ICD-10-CM

## 2012-10-03 HISTORY — PX: COLONOSCOPY: SHX5424

## 2012-10-03 LAB — CBC
MCH: 23.2 pg — ABNORMAL LOW (ref 26.0–34.0)
MCV: 75.5 fL — ABNORMAL LOW (ref 78.0–100.0)
Platelets: 330 10*3/uL (ref 150–400)
RBC: 3.96 MIL/uL — ABNORMAL LOW (ref 4.22–5.81)
RDW: 17.1 % — ABNORMAL HIGH (ref 11.5–15.5)
WBC: 5.6 10*3/uL (ref 4.0–10.5)

## 2012-10-03 LAB — GLUCOSE, CAPILLARY: Glucose-Capillary: 171 mg/dL — ABNORMAL HIGH (ref 70–99)

## 2012-10-03 LAB — BASIC METABOLIC PANEL
CO2: 23 mEq/L (ref 19–32)
Calcium: 8.8 mg/dL (ref 8.4–10.5)
Creatinine, Ser: 1.27 mg/dL (ref 0.50–1.35)
GFR calc Af Amer: 65 mL/min — ABNORMAL LOW (ref >90)
Sodium: 138 mEq/L (ref 135–145)

## 2012-10-03 SURGERY — COLONOSCOPY
Anesthesia: Moderate Sedation | Laterality: Left

## 2012-10-03 MED ORDER — MIDAZOLAM HCL 5 MG/5ML IJ SOLN
INTRAMUSCULAR | Status: DC | PRN
  Administered 2012-10-03 (×2): 2 mg via INTRAVENOUS

## 2012-10-03 MED ORDER — FENTANYL CITRATE 0.05 MG/ML IJ SOLN
INTRAMUSCULAR | Status: AC
  Filled 2012-10-03: qty 4

## 2012-10-03 MED ORDER — MIDAZOLAM HCL 5 MG/ML IJ SOLN
INTRAMUSCULAR | Status: AC
  Filled 2012-10-03: qty 3

## 2012-10-03 MED ORDER — FENTANYL CITRATE 0.05 MG/ML IJ SOLN
INTRAMUSCULAR | Status: DC | PRN
  Administered 2012-10-03 (×2): 25 ug via INTRAVENOUS

## 2012-10-03 MED ORDER — DIPHENHYDRAMINE HCL 50 MG/ML IJ SOLN
INTRAMUSCULAR | Status: AC
  Filled 2012-10-03: qty 1

## 2012-10-03 NOTE — H&P (View-Only) (Signed)
[facility]  PROGRESS NOTE  Brief narrative:  [name] is an 72 y.o. male with a past medical history of recent aspiration pneumonia, atrial fibrillation with RVR, COPD, and diabetes who initially presented to [facility] on 09/19/2012 with altered mental status requiring intubation for airway protection. He subsequently was transferred to [place] under the care of the critical care service. He was successfully extubated on 09/20/2012 after being pan cultured and treated with broad-spectrum antibiotics for pneumonia.  He has Atrial fibrillation and needs to be placed on anticoagulation, however, he also has normocytic anemia and a work up for GIB is ongoing.  EGD showed no source.    Assessment/Plan: Principal Problem:  Acute respiratory failure secondary to pulmonary edema and community-acquired pneumonia / history of obstructive sleep apnea and COPD  - He was Intubated 4/12-4/13.  - Continue CPAP each bedtime. Continue albuterol as needed. - Finished a course of empiric antibiotics which were narrowed to Rocephin. Initially on vancomycin/Zosyn.  Pulmonary status is improved, he is not requiring oxygen  - Continue oxygen prn to keep sats greater than 92%.  - Resolved  Active Problems:  Atrial fibrillation with rapid ventricular response  - Continue PO amiodarone , Cardizem, and metoprolol. He is on suggested doses from Cardiology and will likely continue on these as outpatient - Anticoagulation on hold during workup for anemia, EGD without source of bleeding 4/26 - Colonoscopy planned for 4/27  Normocytic anemia / Hemoccult + stools (in the setting of supratherapeutic INR)  - Hemoglobin has dropped steadily over the course of his hospital stay, but has remained stable over the last few days, now 9.2 - Lab findings are most suggestive of anemia of chronic disease with concomitant iron deficiency.  - Continue B12 supplementation therapy.  - Fecal occult blood testing was positive.  -  Continue PPI therapy.  - GI following; Seen by Dr. Outlaw on 09/30/2012.   - EGD without source of bleeding discovered on 4/26 - Colonoscopy 4/27  H/O CAD (coronary artery disease), native coronary artery  -No acute issue  IDDM (insulin dependent diabetes mellitus)  -Continue SSI.  - He is on lantus 40 units at bedtime at home.   - Once eating more regularly, restart lantus  Hypertension associated with diabetes / hypertensive emergency with acute pulmonary edema  - Blood pressure currently controlled on antihypertensives.  However, A little elevated occasionally to 150s-160s systolic - Home meds still on hold are clonidine and tamsulosin.  No need to restart currently.    HCAP (healthcare-associated pneumonia)  -  Resolved - Initially treated with broad-spectrum antibiotics, antibiotics subsequently narrowed to Rocephin and discontinued.   COPD (chronic obstructive pulmonary disease)  - Stable, not requiring O2 at this time, no wheezing on exam. Continue albuterol when necessary.   Coagulopathy  - Secondary to Coumadin for chronic atrial fibrillation.  - INR improved 10/02/12 for EGD  Status post nephrectomy/Acute kidney injury  -Renal function improved and stable, resolved  Acute encephalopathy  - Improved, alert and oriented today - Status post psychiatric evaluation on 09/25/2012: Delirium with visual hallucinations, patient felt to have capacity - Limit narcotics if possible - on methadone and fentanyl prn - Seroquel was increased was previously increased during this stay.  - Resolved    Code Status: Full.  Family Communication: Updated patient, no family at bedside.  Disposition Plan: Home when stable.   Medical Consultants:  Dr. Jayadeep Varanasi, Cardiology  Dr. Janardhaha Jonnalagadda, Psychiatry  Dr. Daniel Feinstein, PCCM Dr. Outlaw,   GI Other Consultants:  Occupational therapy: Home OT Anti-infectives:  Vancomycin 4/12 >>4/14  Zosyn 4/12 >>4/12   Ceftriaxone 4/14>>>4/16    Subjective: Mr. Syverson sleepy this AM due to being up for colonoscopy prep.  He reports no new issues, no pain and his breathing is at baseline.   Objective: Vital signs in last 24 hours: Temp:  [97.2 F (36.2 C)-98.1 F (36.7 C)] 98 F (36.7 C) (04/26 1153) Pulse Rate:  [72-80] 72 (04/26 0407) Resp:  [12-18] 12 (04/26 1153) BP: (119-135)/(60-65) 119/65 mmHg (04/26 1153) SpO2:  [92 %-99 %] 92 % (04/26 1153) Weight:  [220 lb 0.3 oz (99.8 kg)] 220 lb 0.3 oz (99.8 kg) (04/26 0407) Weight change: -4 lb 6.5 oz (-2 kg) Last BM Date: 10/02/12  Intake/Output from previous day: 04/25 0701 - 04/26 0700 In: 360 [P.O.:360] Out: 1051 [Urine:1050; Stool:1] Intake/Output this shift:    General appearance: groggy by alert, cooperative and appears stated age Head: Normocephalic Eyes: EOMI, anicteric sclerae Resp: clear to auscultation anteriorly Cardio: RR, NR, no murmur noted GI: soft, NT, ND, hyperactive bowel sounds Extremities:no edema, missing digit on the left hand, fourth finger Pulses: 2+ and symmetric  Lab Results:  Recent Labs  10/02/12 0510 10/03/12 0535  WBC 5.8 5.6  HGB 8.3* 9.2*  HCT 26.9* 29.9*  PLT 309 330   BMET  Recent Labs  10/03/12 0535  NA 138  K 4.9  CL 100  CO2 23  GLUCOSE 120*  BUN 15  CREATININE 1.27  CALCIUM 8.8    Studies/Results: No results found.  Medications: I have reviewed the patient's current medications.    LOS: 14 days   Danelia Snodgrass 10/03/2012, 11:59 AM   

## 2012-10-03 NOTE — Progress Notes (Signed)
Patient ID: [name], male   DOB: 1940/10/20, 72 y.o.   MRN: [id number] Chart reviewed. Maintaining sinus rhythm. Plans per Dr Eldridge Dace. Call me if questions.

## 2012-10-03 NOTE — Progress Notes (Signed)
[facility]  PROGRESS NOTE  Brief narrative:  [name] is an 72 y.o. male with a past medical history of recent aspiration pneumonia, atrial fibrillation with RVR, COPD, and diabetes who initially presented to [facility] on 09/19/2012 with altered mental status requiring intubation for airway protection. He subsequently was transferred to [facility] under the care of the critical care service. He was successfully extubated on 09/20/2012 after being pan cultured and treated with broad-spectrum antibiotics for pneumonia.  He has Atrial fibrillation and needs to be placed on anticoagulation, however, he also has normocytic anemia and a work up for GIB is ongoing.  EGD showed no source.    Assessment/Plan: Principal Problem:  Acute respiratory failure secondary to pulmonary edema and community-acquired pneumonia / history of obstructive sleep apnea and COPD  - He was Intubated 4/12-4/13.  - Continue CPAP each bedtime. Continue albuterol as needed. - Finished a course of empiric antibiotics which were narrowed to Rocephin. Initially on vancomycin/Zosyn.  Pulmonary status is improved, he is not requiring oxygen  - Continue oxygen prn to keep sats greater than 92%.  - Resolved  Active Problems:  Atrial fibrillation with rapid ventricular response  - Continue PO amiodarone , Cardizem, and metoprolol. He is on suggested doses from Cardiology and will likely continue on these as outpatient - Anticoagulation on hold during workup for anemia, EGD without source of bleeding 4/26 - Colonoscopy planned for 4/27  Normocytic anemia / Hemoccult + stools (in the setting of supratherapeutic INR)  - Hemoglobin has dropped steadily over the course of his hospital stay, but has remained stable over the last few days, now 9.2 - Lab findings are most suggestive of anemia of chronic disease with concomitant iron deficiency.  - Continue B12 supplementation therapy.  - Fecal occult blood testing was positive.  -  Continue PPI therapy.  - GI following; Seen by Dr. Dulce Sellar on 09/30/2012.   - EGD without source of bleeding discovered on 4/26 - Colonoscopy 4/27  H/O CAD (coronary artery disease), native coronary artery  -No acute issue  IDDM (insulin dependent diabetes mellitus)  -Continue SSI.  - He is on lantus 40 units at bedtime at home.   - Once eating more regularly, restart lantus  Hypertension associated with diabetes / hypertensive emergency with acute pulmonary edema  - Blood pressure currently controlled on antihypertensives.  However, A little elevated occasionally to 150s-160s systolic - Home meds still on hold are clonidine and tamsulosin.  No need to restart currently.    HCAP (healthcare-associated pneumonia)  -  Resolved - Initially treated with broad-spectrum antibiotics, antibiotics subsequently narrowed to Rocephin and discontinued.   COPD (chronic obstructive pulmonary disease)  - Stable, not requiring O2 at this time, no wheezing on exam. Continue albuterol when necessary.   Coagulopathy  - Secondary to Coumadin for chronic atrial fibrillation.  - INR improved 10/02/12 for EGD  Status post nephrectomy/Acute kidney injury  -Renal function improved and stable, resolved  Acute encephalopathy  - Improved, alert and oriented today - Status post psychiatric evaluation on 09/25/2012: Delirium with visual hallucinations, patient felt to have capacity - Limit narcotics if possible - on methadone and fentanyl prn - Seroquel was increased was previously increased during this stay.  - Resolved    Code Status: Full.  Family Communication: Updated patient, no family at bedside.  Disposition Plan: Home when stable.   Medical Consultants:  Dr. Lance Muss, Cardiology  Dr. Darrol Jump, Psychiatry  Dr. Rory Percy, PCCM Dr. Dulce Sellar,  GI Other Consultants:  Occupational therapy: Home OT Anti-infectives:  Vancomycin 4/12 >>4/14  Zosyn 4/12 >>4/12   Ceftriaxone 4/14>>>4/16    Subjective: Mr. Lehenbauer sleepy this AM due to being up for colonoscopy prep.  He reports no new issues, no pain and his breathing is at baseline.   Objective: Vital signs in last 24 hours: Temp:  [97.2 F (36.2 C)-98.1 F (36.7 C)] 98 F (36.7 C) (04/26 1153) Pulse Rate:  [72-80] 72 (04/26 0407) Resp:  [12-18] 12 (04/26 1153) BP: (119-135)/(60-65) 119/65 mmHg (04/26 1153) SpO2:  [92 %-99 %] 92 % (04/26 1153) Weight:  [220 lb 0.3 oz (99.8 kg)] 220 lb 0.3 oz (99.8 kg) (04/26 0407) Weight change: -4 lb 6.5 oz (-2 kg) Last BM Date: 10/02/12  Intake/Output from previous day: 04/25 0701 - 04/26 0700 In: 360 [P.O.:360] Out: 1051 [Urine:1050; Stool:1] Intake/Output this shift:    General appearance: groggy by alert, cooperative and appears stated age Head: Normocephalic Eyes: EOMI, anicteric sclerae Resp: clear to auscultation anteriorly Cardio: RR, NR, no murmur noted GI: soft, NT, ND, hyperactive bowel sounds Extremities:no edema, missing digit on the left hand, fourth finger Pulses: 2+ and symmetric  Lab Results:  Recent Labs  10/02/12 0510 10/03/12 0535  WBC 5.8 5.6  HGB 8.3* 9.2*  HCT 26.9* 29.9*  PLT 309 330   BMET  Recent Labs  10/03/12 0535  NA 138  K 4.9  CL 100  CO2 23  GLUCOSE 120*  BUN 15  CREATININE 1.27  CALCIUM 8.8    Studies/Results: No results found.  Medications: I have reviewed the patient's current medications.    LOS: 14 days   MULLEN, EMILY 10/03/2012, 11:59 AM

## 2012-10-03 NOTE — Op Note (Signed)
[facility] [address] [place], [address]   COLONOSCOPY PROCEDURE REPORT  PATIENT: [name], [name]  MR#: [id number] BIRTHDATE: 1941-05-21 , 71  yrs. old GENDER: Male ENDOSCOPIST: Charlott Rakes, MD REFERRED [id number] team PROCEDURE DATE:  10/03/2012 PROCEDURE:   Colonoscopy with biopsy ASA CLASS:   Class III INDICATIONS:Anemia; heme positive stool. MEDICATIONS: Fentanyl 50 mcg IV and Versed 4 mg IV  DESCRIPTION OF PROCEDURE:   After the risks benefits and alternatives of the procedure were thoroughly explained, informed consent was obtained.  The Pentax Ped Colon [id number]  endoscope was introduced through the anus and advanced to the cecum, which was identified by both the appendix and ileocecal valve , limited by No adverse events experienced.   limited by Limited by poor preparation.   limited by Limited by a tortuous and redundant colon.   The quality of the prep was poor. .  The instrument was then slowly withdrawn as the colon was fully examined.     FINDINGS:  Rectal exam limited due to large amount of solid and semi-solid stool.  Pediatric colonoscope inserted into the colon and advanced to the cecum, where the appendiceal orifice and ileocecal valve were identified. Poor prep prevented adequate visualization of the colon. Excessive looping prevented intubation of the ICV. The ICV was enlarged and appeared to be lipomatous. Biopsies were taken. Unable to clear cecum due to solid and semi-solid stool. Retroflexion limited views of rectum due to solid stool.  COMPLICATIONS: None  IMPRESSION:     Poorly prepped colon prevented adequate evaluation of the colon Lipomatous ICV -s/p biopsies  RECOMMENDATIONS:  F/U on path; No repeat colon at this time; Advance diet; Supportive care    ______________________________ eSignedCharlott Rakes, MD 10/03/2012 2:06 PM   CC:

## 2012-10-03 NOTE — Interval H&P Note (Signed)
History and Physical Interval Note:  10/03/2012 1:02 PM  [name]  has presented today for surgery, with the diagnosis of anemia, melena, negative endoscopy  The various methods of treatment have been discussed with the patient and family. After consideration of risks, benefits and other options for treatment, the patient has consented to  Procedure(s): COLONOSCOPY (Left) as a surgical intervention .  The patient's history has been reviewed, patient examined, no change in status, stable for surgery.  I have reviewed the patient's chart and labs.  Questions were answered to the patient's satisfaction.     Danial Hlavac C.

## 2012-10-03 NOTE — Progress Notes (Signed)
Resp Care Note; Came to assist pt with CPAP,pt stated he was not ready to got to sleep yet and he would place he's self on when he was ready.

## 2012-10-04 DIAGNOSIS — D649 Anemia, unspecified: Secondary | ICD-10-CM

## 2012-10-04 LAB — CBC
HCT: 27.6 % — ABNORMAL LOW (ref 39.0–52.0)
Hemoglobin: 8.4 g/dL — ABNORMAL LOW (ref 13.0–17.0)
MCV: 76 fL — ABNORMAL LOW (ref 78.0–100.0)
RBC: 3.63 MIL/uL — ABNORMAL LOW (ref 4.22–5.81)
WBC: 6.7 10*3/uL (ref 4.0–10.5)

## 2012-10-04 LAB — GLUCOSE, CAPILLARY
Glucose-Capillary: 103 mg/dL — ABNORMAL HIGH (ref 70–99)
Glucose-Capillary: 140 mg/dL — ABNORMAL HIGH (ref 70–99)
Glucose-Capillary: 125 mg/dL — ABNORMAL HIGH (ref 70–99)
Glucose-Capillary: 143 mg/dL — ABNORMAL HIGH (ref 70–99)

## 2012-10-04 NOTE — Progress Notes (Signed)
[facility] PROGRESS NOTE  [name] ZOX:[id number] DOB: [birth year] DOA: 09/19/2012 PCP: No primary provider on file.  Assessment/Plan: Principal Problem:   Acute respiratory failure secondary to pulmonary edema and community-acquired pneumonia Active Problems:   Atrial fibrillation with rapid ventricular response   CAD (coronary artery disease), native coronary artery   IDDM (insulin dependent diabetes mellitus)   Hyperlipidemia LDL goal < 70   Hypertension associated with diabetes   HCAP (healthcare-associated pneumonia)   COPD (chronic obstructive pulmonary disease)   Acute pulmonary edema   Anemia    1. Acute respiratory failure secondary to pulmonary edema and community-acquired pneumonia: Patient was tranfered from [facility] with acte respiratory failure/VDRF on 09/19/12. This was multifactorial, secondary to CAP, pulmonary edema, in the setting of COPD/OSA. managed with initially with iv vancomycin/Zosyn, as well as supportive treatment, with satisfactory clinical response. Extubated successfyully on 09/20/12. antibiotics were subsequently narrowed to Rocephin monotherapy, and concluded on 09/23/12. Pulmonary status has improved, and he is currently saturating at 92% on RA.  2. COPD: Exacerbated by pneumonia. Managed with with bronchodilators. Now stable. 3. OSA: Stable on nocturnal CPAP.   4. Atrial fibrillation with rapid ventricular response: Patient was in rapid atrial fibrillation, in the first few hospital days. Dr Charlton Haws provided cardiology consultation and patient was initially managed with iv Cardizem. He was subsequently transitioned to oral Amiodarone and beta -blocker, with satisfactory rate-control. Will need to discuss the issue of continued anticoagulation, prior to discharge. Anticoagulation on hold during workup for anemia.  5. Normocytic anemia/Hemoccult positive stools (in the setting of supratherapeutic INR): Hemoglobin dropped steadily over the course of  his hospital stay. Lab findings are most suggestive of anemia of chronic disease with concomitant iron deficiency. Fecal occult blood testing was positive. Dr Willis Modena provided GI consultation and performed EGD on 10/03/12. This was negative. Dr Charlott Rakes performed colonoscopy on 10/03/12, which was limited by poor prep. Biopsies were done, but no overt bleeding source seen. No further GI work up planned. HB is stable. Following CBC.  6. H/O CAD (coronary artery disease), native coronary artery/ Stable/'No acute issue  7. IDDM (insulin dependent diabetes mellitus): Controlled on diet/SSI. Following CBGs. Will likely restart bedtime Lantus on discharge.  8. Hypertension/Hypertensive emergency with acute pulmonary edema: BP was markedly elevated on admission, but has responded to judicious use of antihypertensives, and is currently normal. Home meds still on hold are Clonidine and Tamsulosin. 9. Coagulopathy: Secondary to Coumadin for chronic atrial fibrillation. INR improved 10/02/12 for EGD. Will discuss continued anticoagulation with GI and cardiology, prior to discharge.   10. Status post nephrectomy/Acute kidney injury: Patient had a slight bump in creatinine up to 1.44 during his hospitalization. Renal function improved and stable, resolved. Creatinine is 1.27 today.   11. Acute encephalopathy: Patient has altered mental status/delirium in the initial part of his hospital stay, likely due to his acute medical issues. Status post psychiatric evaluation on 09/25/2012: Delirium with visual hallucinations, patient felt to have capacity. Narcotics were limited. Acute problems addresses successfully. AMS has resolved. Seroquel was increased was previously increased during this stay.      Code Status: Full Code.  Family Communication:  Disposition Plan: To be determined. Aiming discharge on 10/05/12.    Brief narrative: 72 y.o. male with a past medical history of recent aspiration pneumonia,  atrial fibrillation with RVR, COPD, and diabetes who initially presented to [facility] on 09/19/2012 with altered mental status requiring intubation for airway protection. He subsequently was transferred to  [facility] hospital  under the care of the critical care service. He was successfully extubated on 09/20/2012 after being pan cultured and treated with broad-spectrum antibiotics for pneumonia. He has Atrial fibrillation and needs to be placed on anticoagulation, however, he also has normocytic anemia and a work up for GIB is ongoing. EGD showed no source.    Consultants:  Dr Charlton Haws, cardiologist.  Dr Emilee Hero, psychiatrist.  Dr Mariel Aloe, GI.   Procedures:  See  Notes.   Antibiotics:  Rocephin. Completed 09/23/12.   HPI/Subjective: No new issues. Feels quite well.   Objective: Vital signs in last 24 hours: Temp:  [97.6 F (36.4 C)-98.5 F (36.9 C)] 98.5 F (36.9 C) (04/27 1422) Pulse Rate:  [56-80] 56 (04/27 1422) Resp:  [18] 18 (04/27 1422) BP: (102-125)/(55-77) 102/69 mmHg (04/27 1422) SpO2:  [92 %-100 %] 92 % (04/27 1422) Weight:  [101 kg (222 lb 10.6 oz)] 101 kg (222 lb 10.6 oz) (04/27 0500) Weight change: 1.2 kg (2 lb 10.3 oz) Last BM Date: 10/04/12  Intake/Output from previous day: 04/26 0701 - 04/27 0700 In: -  Out: 1000 [Urine:1000]     Physical Exam: General: Comfortable, alert, communicative, fully oriented, not short of breath at rest.  HEENT:  Moderate clinical pallor, no jaundice, no conjunctival injection or discharge. NECK:  Supple, JVP not seen, no carotid bruits, no palpable lymphadenopathy, no palpable goiter. CHEST:  Clinically clear to auscultation, no wheezes, no crackles. HEART:  Sounds 1 and 2 heard, normal, regular, no murmurs. ABDOMEN:  Moderately obese, soft, non-tender, no palpable organomegaly, no palpable masses, normal bowel sounds. GENITALIA:  Not examined. LOWER EXTREMITIES:  No pitting edema, palpable peripheral  pulses. MUSCULOSKELETAL SYSTEM:  Unremarkable. CENTRAL NERVOUS SYSTEM:  No focal neurologic deficit on gross examination.  Lab Results:  Recent Labs  10/03/12 0535 10/04/12 0530  WBC 5.6 6.7  HGB 9.2* 8.4*  HCT 29.9* 27.6*  PLT 330 344    Recent Labs  10/03/12 0535  NA 138  K 4.9  CL 100  CO2 23  GLUCOSE 120*  BUN 15  CREATININE 1.27  CALCIUM 8.8   No results found for this or any previous visit (from the past 240 hour(s)).   Studies/Results: No results found.  Medications: Scheduled Meds: . amiodarone  400 mg Oral BID  . diltiazem  360 mg Oral Daily  . insulin aspart  0-20 Units Subcutaneous TID AC & HS  . methadone  5 mg Oral Daily  . metoprolol tartrate  50 mg Oral BID  . pantoprazole  40 mg Oral Daily  . polyethylene glycol  17 g Oral Daily  . QUEtiapine  50 mg Oral QHS   Continuous Infusions:  PRN Meds:.albuterol, fentaNYL, ipratropium, nitroGLYCERIN, ondansetron    LOS: 15 days   [name],[name]  [facility] Pager [phone]. If 8PM-8AM, please contact night-coverage at [email or web address], password [facility] 10/04/2012, 6:56 PM  LOS: 15 days

## 2012-10-04 NOTE — Progress Notes (Signed)
Patient ID: [name], male   DOB: December 19, 1940, 72 y.o.   MRN: [id number]  Patient sitting in chair. Reports brown stool today. No complaints. On carb modified diet. No further inpt GI workup. Will sign off. Call if questions.

## 2012-10-05 ENCOUNTER — Encounter (HOSPITAL_COMMUNITY): Payer: Self-pay | Admitting: Gastroenterology

## 2012-10-05 DIAGNOSIS — J810 Acute pulmonary edema: Secondary | ICD-10-CM

## 2012-10-05 LAB — BASIC METABOLIC PANEL
CO2: 31 mEq/L (ref 19–32)
Calcium: 8.9 mg/dL (ref 8.4–10.5)
Creatinine, Ser: 1.54 mg/dL — ABNORMAL HIGH (ref 0.50–1.35)
GFR calc non Af Amer: 44 mL/min — ABNORMAL LOW (ref >90)

## 2012-10-05 LAB — CBC
MCH: 23.7 pg — ABNORMAL LOW (ref 26.0–34.0)
MCHC: 31.9 g/dL (ref 30.0–36.0)
MCV: 74.1 fL — ABNORMAL LOW (ref 78.0–100.0)
Platelets: 308 10*3/uL (ref 150–400)
RBC: 3.55 MIL/uL — ABNORMAL LOW (ref 4.22–5.81)
RDW: 16.7 % — ABNORMAL HIGH (ref 11.5–15.5)

## 2012-10-05 MED ORDER — AMIODARONE HCL 200 MG PO TABS: 400.0000 mg | ORAL_TABLET | Freq: Every day | ORAL | Status: DC

## 2012-10-05 MED ORDER — DILTIAZEM HCL ER COATED BEADS 360 MG PO CP24: 360.0000 mg | ORAL_CAPSULE | Freq: Every day | ORAL | Status: DC

## 2012-10-05 MED ORDER — ASPIRIN EC 81 MG PO TBEC
81.0000 mg | DELAYED_RELEASE_TABLET | Freq: Every day | ORAL | Status: DC
  Filled 2012-10-05: qty 1

## 2012-10-05 MED ORDER — AMIODARONE HCL 400 MG PO TABS: 400.0000 mg | ORAL_TABLET | Freq: Every day | ORAL | Status: DC

## 2012-10-05 MED ORDER — ASPIRIN 81 MG PO TBEC: 81.0000 mg | DELAYED_RELEASE_TABLET | Freq: Every day | ORAL | Status: DC

## 2012-10-05 MED ORDER — INSULIN GLARGINE 100 UNIT/ML ~~LOC~~ SOLN: 10.0000 [IU] | Freq: Every day | SUBCUTANEOUS | Status: DC

## 2012-10-05 MED ORDER — QUETIAPINE FUMARATE 50 MG PO TABS: 50.0000 mg | ORAL_TABLET | Freq: Every day | ORAL | Status: DC

## 2012-10-05 MED ORDER — TAMSULOSIN HCL 0.4 MG PO CAPS
0.4000 mg | ORAL_CAPSULE | Freq: Every day | ORAL | Status: DC
  Filled 2012-10-05: qty 1

## 2012-10-05 NOTE — Progress Notes (Addendum)
SUBJECTIVE:    Currently, Maintaining normal sinus rhythm on amiodarone, Cardizem, metoprolol.  Heme positive stool.  Negative EGD, colonoscopy.  Mention of capsule study in prior GI notes.  OBJECTIVE:   Vitals:   Filed Vitals:   10/04/12 1422 10/04/12 2012 10/04/12 2330 10/05/12 0544  BP: 102/69 120/61  121/70  Pulse: 56 60 66 70  Temp: 98.5 F (36.9 C) 98.7 F (37.1 C)  98.3 F (36.8 C)  TempSrc: Oral Oral  Oral  Resp: 18 16 16 16   Height:      Weight:    100.7 kg (222 lb 0.1 oz)  SpO2: 92% 95% 95% 92%   I&O's:    Intake/Output Summary (Last 24 hours) at 10/05/12 1006 Last data filed at 10/05/12 4782  Gross per 24 hour  Intake    480 ml  Output   1325 ml  Net   -845 ml   TELEMETRY: Reviewed telemetry pt in normal sinus rhythm with bundle branch block:     PHYSICAL EXAM General: Well developed, well nourished,  Head: Normal cephalic and atramatic  Lungs:   Clear bilaterally to auscultation and percussion. Heart:  HRRR S1 S2  Abdomen: Mild left sided abdominal tenderness. Msk:  . Normal strength and tone for age. Extremities:   No  edema.   Neuro: Alert and oriented X 3. Psych:  Normal affect, responds appropriately   LABS: Basic Metabolic Panel:  Recent Labs  95/62/13 0535 10/05/12 0500  NA 138 138  K 4.9 4.5  CL 100 103  CO2 23 31  GLUCOSE 120* 112*  BUN 15 18  CREATININE 1.27 1.54*  CALCIUM 8.8 8.9   Liver Function Tests: No results found for this basename: AST, ALT, ALKPHOS, BILITOT, PROT, ALBUMIN,  in the last 72 hours No results found for this basename: LIPASE, AMYLASE,  in the last 72 hours CBC:  Recent Labs  10/04/12 0530 10/05/12 0500  WBC 6.7 6.0  HGB 8.4* 8.4*  HCT 27.6* 26.3*  MCV 76.0* 74.1*  PLT 344 308   Cardiac Enzymes: No results found for this basename: CKTOTAL, CKMB, CKMBINDEX, TROPONINI,  in the last 72 hours BNP: No components found with this basename: POCBNP,  D-Dimer: No results found for this basename: DDIMER,  in  the last 72 hours Hemoglobin A1C: No results found for this basename: HGBA1C,  in the last 72 hours Fasting Lipid Panel: No results found for this basename: CHOL, HDL, LDLCALC, TRIG, CHOLHDL, LDLDIRECT,  in the last 72 hours Thyroid Function Tests: No results found for this basename: TSH, T4TOTAL, FREET3, T3FREE, THYROIDAB,  in the last 72 hours Anemia Panel: No results found for this basename: VITAMINB12, FOLATE, FERRITIN, TIBC, IRON, RETICCTPCT,  in the last 72 hours Coag Panel:   Lab Results  Component Value Date   INR 1.39 10/01/2012   INR 3.47* 09/29/2012   INR 2.87* 09/27/2012    09/19/2012  *RADIOLOGY REPORT*  Clinical Data: Altered level of consciousness.  Atrial fibrillation.  Insulin dependent diabetes.  Hypertension.  CT HEAD WITHOUT CONTRAST  Technique:  Contiguous axial images were obtained from the base of the skull through the vertex without contrast.  Comparison: 08/25/2012.  Findings: Mild atrophy and chronic microvascular ischemic change. No acute cortical infarction or hemorrhage.  No mass lesion or hydrocephalus.  Right  thalamic and right putaminal lacunes.  Left middle cranial fossa arachnoid cyst. Vascular calcifications. Nasopharyngeal adenoidal hypertrophy or dependent nasopharyngeal fluid.  Calvarium intact.  No acute sinus or mastoid disease.  IMPRESSION: Chronic changes as described.  No acute intracranial findings. Stable appearance from priors.   Original Report Authenticated By: Davonna Belling, M.D.       ASSESSMENT: Atrial fibrillation, coronary artery disease  PLAN:  Atrial fibrillation:It appears he will need an antiarrhythmic to maintain sinus rhythm.  He previously felt when he went out of rhythm.  He stated this has happened many times over the past year.  Continue amiodarone load.  At the time of discharge, would send home on amiodarone 400 mg daily.  He will likely need Cardizem CD 360 mg daily.  He'll need metoprolol 50 mg by mouth twice a  day.  Anticoagulation: GI workup in progress of anemia.  Anticoagulation will depend on results from possible  capsule endoscopy.  EGD/colon negative.  Would wait for Hbg to be stable before starting anticoagulation.  He would need reliable f/u as well.  He can see me back in 3-4 weeks.  If ok with GI, Would send home on ASA 81 mg daily.    He will see Dr. Janna Arch as his PMD.    Corky Crafts., MD  10/05/2012  10:06 AM

## 2012-10-05 NOTE — Progress Notes (Signed)
Dc instructions given to pt at this time re: f/u appts, meds, activity, diet, s/s of problems, mychart, and community resources.  Pt verbalized understanding of all instructions.  No s/s of any acute distress at this time.

## 2012-10-05 NOTE — Progress Notes (Signed)
Physical Therapy Treatment Patient Details Name: [name] MRN: [id number] DOB: 1941/05/08 Today's Date: 10/05/2012 Time: 4696-2952 PT Time Calculation (min): 16 min  PT Assessment / Plan / Recommendation Comments on Treatment Session  Pt with better stability using rw today. Still very unsteady without. Rec continued use of rw. SpO2 remained >94% on rm air, but pt DOE increased and session limited by work of breathing. Patient sat EOB with some SOB and shaking for approx 2 minutes before resolved.  Will continue to see and progress activity as tolerated.    Follow Up Recommendations  Home health PT;Supervision for mobility/OOB;Other (comment) (Should go to rehab, but will likely choose home)     Does the patient have the potential to tolerate intense rehabilitation     Barriers to Discharge        Equipment Recommendations  None recommended by PT    Recommendations for Other Services    Frequency Min 3X/week   Plan Discharge plan remains appropriate;Frequency remains appropriate    Precautions / Restrictions Precautions Precautions: Fall Restrictions Weight Bearing Restrictions: No   Pertinent Vitals/Pain No pain at this time    Mobility  Bed Mobility Bed Mobility: Not assessed Supine to Sit: 6: Modified independent (Device/Increase time) Transfers Transfers: Sit to Stand;Stand to Sit Sit to Stand: 5: Supervision Stand to Sit: 5: Supervision Details for Transfer Assistance: VCs for hand placement Ambulation/Gait Ambulation/Gait Assistance: 5: Supervision Ambulation Distance (Feet): 160 Feet Assistive device: Rolling walker Gait Pattern: Step-through pattern Gait velocity: WNL General Gait Details: ambulated on room air; SpO2 remained >95% Stairs: Yes Stairs Assistance: 4: Min guard Stair Management Technique: One rail Right;Forwards Number of Stairs: 12     PT Goals Acute Rehab PT Goals PT Goal Formulation: With patient Time For Goal Achievement:  10/07/12 Potential to Achieve Goals: Fair Pt will go Sit to Stand: with supervision;with upper extremity assist PT Goal: Sit to Stand - Progress: Met Pt will go Stand to Sit: with supervision;with upper extremity assist PT Goal: Stand to Sit - Progress: Met Pt will Ambulate: 51 - 150 feet;with supervision;with least restrictive assistive device PT Goal: Ambulate - Progress: Met Pt will Go Up / Down Stairs: 3-5 stairs;with min assist;with rail(s) PT Goal: Up/Down Stairs - Progress: Met  Visit Information  Last PT Received On: 10/05/12 Assistance Needed: +1    Subjective Data  Subjective: I don't wanna wear myself out too much Patient Stated Goal: to go home   Cognition  Cognition Arousal/Alertness: Awake/alert Behavior During Therapy: WFL for tasks assessed/performed Overall Cognitive Status:  (apparent STM deficits) General Comments: Pt demonstrates significant improvement sin cognition today    Balance  Static Standing Balance Static Standing - Balance Support: During functional activity;Left upper extremity supported;Right upper extremity supported Static Standing - Level of Assistance: 5: Stand by assistance  End of Session PT - End of Session Equipment Utilized During Treatment: Gait belt Activity Tolerance: Patient limited by fatigue Patient left: in chair;with call bell/phone within reach;with chair alarm set;with nursing in room;with family/visitor present Nurse Communication: Mobility status   GP     Fabio Asa 10/05/2012, 12:28 PM Charlotte Crumb, PT DPT  [phone]

## 2012-10-05 NOTE — Discharge Summary (Signed)
Physician Discharge Summary  [name] ZOX:[id number] DOB: 1940/09/20 DOA: 09/19/2012  PCP: No primary provider on file.  Admit date: 09/19/2012 Discharge date: 10/05/2012  Time spent: 40 minutes  Recommendations for Outpatient Follow-up:  1. Follow up with primary MD. 2. Follow up with primary cardiologist, Dr Eldridge Dace.  3. Follow up with Dr Willis Modena, gastroenterologist. 4. HHPT/Tub Bench.    Discharge Diagnoses:  Principal Problem:   Acute respiratory failure secondary to pulmonary edema and community-acquired pneumonia Active Problems:   Atrial fibrillation with rapid ventricular response   CAD (coronary artery disease), native coronary artery   IDDM (insulin dependent diabetes mellitus)   Hyperlipidemia LDL goal < 70   Hypertension associated with diabetes   HCAP (healthcare-associated pneumonia)   COPD (chronic obstructive pulmonary disease)   Acute pulmonary edema   Anemia   Discharge Condition: Satisfactory.   Diet recommendation: Heart-Healthy/Carbohydrate-Modified.   Filed Weights   10/03/12 0407 10/04/12 0500 10/05/12 0544  Weight: 99.8 kg (220 lb 0.3 oz) 101 kg (222 lb 10.6 oz) 100.7 kg (222 lb 0.1 oz)    History of present illness:  72 y.o. male with a past medical history of recent aspiration pneumonia, atrial fibrillation with RVR, COPD, and diabetes who initially presented to [facility] on 09/19/2012 with altered mental status requiring intubation for airway protection. He subsequently was transferred to [facility] under the care of the critical care service. He was successfully extubated on 09/20/2012 after being pan-cultured and treated with broad-spectrum antibiotics for pneumonia.     Hospital Course:  1. Acute respiratory failure secondary to pulmonary edema and community-acquired pneumonia: Patient was tranfered from [facility] with acte respiratory failure/VDRF on 09/19/12. This was multifactorial, secondary to CAP, pulmonary edema, in the setting of  COPD/OSA. managed with initially with iv vancomycin/Zosyn, as well as supportive treatment, with satisfactory clinical response. Extubated successfyully on 09/20/12. Antibiotics were subsequently narrowed to Rocephin monotherapy, and concluded on 09/23/12. Pulmonary status has improved, and he is currently saturating at 92%-95% on RA.  2. COPD: Exacerbated by pneumonia. Managed with with bronchodilators. Now stable.  3. OSA: Stable on nocturnal CPAP.  4. Atrial fibrillation with rapid ventricular response: Patient was in rapid atrial fibrillation in the first few hospital days. Dr Charlton Haws provided cardiology consultation and patient was initially managed with iv Cardizem. He was subsequently transitioned to oral Amiodarone, Cardizem CD and beta -blocker, with satisfactory rate-control.  5. Normocytic anemia/Hemoccult positive stools (in the setting of supratherapeutic INR): Hemoglobin dropped steadily over the course of his hospital stay. Lab findings are most suggestive of anemia of chronic disease with concomitant iron deficiency. However, fecal occult blood testing was positive, in the setting of Coumadin anticoagulation and supratherapeutic INR. Coumadin was placed on hold, Dr Willis Modena provided GI consultation and performed EGD on 10/03/12. This was negative. Dr Charlott Rakes performed colonoscopy on 10/03/12, which was limited by poor prep. Biopsies were done, but no overt bleeding source seen. No further GI work up planned. HB has remained stable. Patient will follow up with Dr Dulce Sellar, on discharge, for possible consideration of capsule endoscopy, if indicated.  6. H/O CAD (coronary artery disease), native coronary artery/ Stable/No acute issues.  7. IDDM (insulin dependent diabetes mellitus): Controlled on diet/SSI, during hosptialization. Will restart bedtime Lantus at 10 units, on discharge. Thereafter, dosage will be adjusted by PMD.  8. Hypertension/Hypertensive emergency with acute  pulmonary edema: BP was markedly elevated on admission, but has responded to judicious use of antihypertensives, and has since  remained normal. Clonidine remains on hold.  9. Coagulopathy: Secondary to Coumadin for chronic atrial fibrillation. INR improved 10/02/12 for EGD. Per my discussion with Dr Eldridge Dace, on 10/05/12, patient is to stay of anticoagulation, till re-evaluated on follow up. ASA 81 mg, has been commenced in lieu.  10. Status post nephrectomy/Acute kidney injury: Patient had a slight bump in creatinine up to 1.44 during his hospitalization. Renal function improved and stable, resolved. Creatinine was 1.27 on 10/04/12, and 1.54 on 10/05/12. PMD will have to keep a close eye on electrolytes.   11. Acute encephalopathy: Patient had altered mental status/delirium in the initial part of his hospital stay, likely due to his acute medical issues. Status post psychiatric evaluation on 09/25/2012, for delirium with visual hallucinations, and patient felt to have capacity. Narcotics were limited. Acute problems were addressed successfully and AMS has resolved. Seroquel was increased was previously increased during this stay.    Procedures:  See Below.   Consultations: Dr Charlton Haws, cardiologist.  Dr Emilee Hero, psychiatrist.  Dr Willis Modena, GI.  Dr Charlott Rakes, GI.   Discharge Exam: Filed Vitals:   10/04/12 1422 10/04/12 2012 10/04/12 2330 10/05/12 0544  BP: 102/69 120/61  121/70  Pulse: 56 60 66 70  Temp: 98.5 F (36.9 C) 98.7 F (37.1 C)  98.3 F (36.8 C)  TempSrc: Oral Oral  Oral  Resp: 18 16 16 16   Height:      Weight:    100.7 kg (222 lb 0.1 oz)  SpO2: 92% 95% 95% 92%    General: Comfortable, alert, communicative, fully oriented, not short of breath at rest.  HEENT: Moderate clinical pallor, no jaundice, no conjunctival injection or discharge.  NECK: Supple, JVP not seen, no carotid bruits, no palpable lymphadenopathy, no palpable goiter.  CHEST:  Clinically clear to auscultation, no wheezes, no crackles.  HEART: Sounds 1 and 2 heard, normal, regular, no murmurs.  ABDOMEN: Moderately obese, soft, non-tender, no palpable organomegaly, no palpable masses, normal bowel sounds.  GENITALIA: Not examined.  LOWER EXTREMITIES: No pitting edema, palpable peripheral pulses.  MUSCULOSKELETAL SYSTEM: Unremarkable.  CENTRAL NERVOUS SYSTEM: No focal neurologic deficit on gross examination.  Discharge Instructions      Discharge Orders   Future Orders Complete By Expires     Diet - low sodium heart healthy  As directed     Diet Carb Modified  As directed     Increase activity slowly  As directed         Medication List    STOP taking these medications       cloNIDine 0.1 MG tablet  Commonly known as:  CATAPRES     diltiazem 120 MG 12 hr capsule  Commonly known as:  CARDIZEM SR     HYDROcodone-acetaminophen 10-325 MG per tablet  Commonly known as:  NORCO     warfarin 7.5 MG tablet  Commonly known as:  COUMADIN      TAKE these medications       amiodarone 400 MG tablet  Commonly known as:  PACERONE  Take 1 tablet (400 mg total) by mouth daily.  Start taking on:  10/06/2012     aspirin 81 MG EC tablet  Take 1 tablet (81 mg total) by mouth daily.     diltiazem 360 MG 24 hr capsule  Commonly known as:  CARDIZEM CD  Take 1 capsule (360 mg total) by mouth daily.     insulin glargine 100 UNIT/ML injection  Commonly known as:  LANTUS  Inject 0.1 mLs (10 Units total) into the skin at bedtime.     ipratropium 0.02 % nebulizer solution  Commonly known as:  ATROVENT  Take 0.5 mg by nebulization every 6 (six) hours as needed. Shortness of breath     iron polysaccharides 150 MG capsule  Commonly known as:  NIFEREX  Take 1 capsule (150 mg total) by mouth 2 (two) times daily.     methadone 5 MG tablet  Commonly known as:  DOLOPHINE  Take 5 mg by mouth daily.     metoprolol 50 MG tablet  Commonly known as:  LOPRESSOR  Take 1  tablet (50 mg total) by mouth 2 (two) times daily.     nitroGLYCERIN 0.4 MG SL tablet  Commonly known as:  NITROSTAT  Place 0.4 mg under the tongue every 5 (five) minutes as needed. Chest pain     pantoprazole 40 MG tablet  Commonly known as:  PROTONIX  Take 40 mg by mouth daily.     QUEtiapine 50 MG tablet  Commonly known as:  SEROQUEL  Take 1 tablet (50 mg total) by mouth at bedtime.     tamsulosin 0.4 MG Caps  Commonly known as:  FLOMAX  Take 1 capsule (0.4 mg total) by mouth daily after supper.       Follow-up Information   Schedule an appointment as soon as possible for a visit with Isabella Stalling, MD.   Contact information:   [address] [place] [address] [phone]       Schedule an appointment as soon as possible for a visit with Freddy Jaksch, MD.   Contact information:   [address] [place] [address] [phone]       Schedule an appointment as soon as possible for a visit with Corky Crafts., MD.   Contact information:   [address] [place] [address] [phone]        The results of significant diagnostics from this hospitalization (including imaging, microbiology, ancillary and laboratory) are listed below for reference.    Significant Diagnostic Studies: Ct Head Wo Contrast  09/24/2012  *RADIOLOGY REPORT*  Clinical Data: Head trauma secondary to a fall.  Scalp contusion.  CT HEAD WITHOUT CONTRAST  Technique:  Contiguous axial images were obtained from the base of the skull through the vertex without contrast.  Comparison: CT scan dated 09/19/2012  Findings: There is no acute intracranial hemorrhage, infarction, or mass lesion.  There are old lacunar infarcts in the right basal ganglia.  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  Osseous structures are normal.  IMPRESSION: No acute abnormalities.   Original Report Authenticated By: Francene Boyers, M.D.    Ct Head Wo  Contrast  09/19/2012  *RADIOLOGY REPORT*  Clinical Data: Altered level of consciousness.  Atrial fibrillation.  Insulin dependent diabetes.  Hypertension.  CT HEAD WITHOUT CONTRAST  Technique:  Contiguous axial images were obtained from the base of the skull through the vertex without contrast.  Comparison: 08/25/2012.  Findings: Mild atrophy and chronic microvascular ischemic change. No acute cortical infarction or hemorrhage.  No mass lesion or hydrocephalus.  Right  thalamic and right putaminal lacunes.  Left middle cranial fossa arachnoid cyst. Vascular calcifications. Nasopharyngeal adenoidal hypertrophy or dependent nasopharyngeal fluid.  Calvarium intact.  No acute sinus or mastoid disease.  IMPRESSION: Chronic changes as described.  No acute intracranial findings. Stable appearance from priors.   Original Report Authenticated By: Jonny Ruiz  Curnes, M.D.    Mr Brain Wo Contrast  09/24/2012  *RADIOLOGY REPORT*  Clinical Data: Confusion.  Encephalopathy.  MRI HEAD WITHOUT CONTRAST  Technique:  Multiplanar, multiecho pulse sequences of the brain and surrounding structures were obtained according to standard protocol without intravenous contrast.  Comparison: CT 09/19/2012  Findings: Negative for acute infarct.  Generalized atrophy.  Chronic ischemic changes are present in the white matter.  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  Small chronic infarct right posterior cerebellum.  Arachnoid cyst anterior to the left temporal lobe measuring 25 x 30 mm.  Negative for intracranial hemorrhage.  Negative for mass lesion.  IMPRESSION: Chronic ischemic changes.  No acute infarct.   Original Report Authenticated By: Janeece Riggers, M.D.    Dg Chest Port 1 View  09/20/2012  *RADIOLOGY REPORT*  Clinical Data: Ventilator dependent respiratory failure.  Follow up CHF.  PORTABLE CHEST - 1 VIEW 09/20/2012 0537 hours:  Comparison: Portable chest x-ray yesterday and 09/01/2012  Findings: Endotracheal tube tip in  satisfactory position approximately 5 cm above carina.  Nasogastric tube courses below the diaphragm into the stomach.  Cardiac silhouette enlarged but stable.  Interval slight improvement in the interstitial and airspace pulmonary edema, though moderate edema persist.  New dense atelectasis in both lower lobes.  IMPRESSION: Support apparatus satisfactory.  Slight improvement in CHF, though moderate interstitial and airspace edema persists.  New dense bilateral lower lobe atelectasis.  Cough,   Original Report Authenticated By: Hulan Saas, M.D.    Dg Chest Portable 1 View  09/19/2012  *RADIOLOGY REPORT*  Clinical Data: Shortness of breath and post intubation.  PORTABLE CHEST - 1 VIEW  Comparison: 09/04/2012  Findings: Endotracheal tube is 6.3 cm above the carina. Nasogastric tube extends into the abdomen.  Again noted are diffuse interstitial densities and possibly airspace disease in the right lower lung.  There is a cardiac pad overlying the left upper chest. Stable appearance of the cardiomediastinal silhouette.  IMPRESSION: Support apparatuses as described.  Diffuse interstitial lung densities.  Findings could represent interstitial pulmonary edema but infection cannot be excluded.   Original Report Authenticated By: Richarda Overlie, M.D.     Microbiology: No results found for this or any previous visit (from the past 240 hour(s)).   Labs: Basic Metabolic Panel:  Recent Labs Lab 10/03/12 0535 10/05/12 0500  NA 138 138  K 4.9 4.5  CL 100 103  CO2 23 31  GLUCOSE 120* 112*  BUN 15 18  CREATININE 1.27 1.54*  CALCIUM 8.8 8.9   Liver Function Tests: No results found for this basename: AST, ALT, ALKPHOS, BILITOT, PROT, ALBUMIN,  in the last 168 hours No results found for this basename: LIPASE, AMYLASE,  in the last 168 hours No results found for this basename: AMMONIA,  in the last 168 hours CBC:  Recent Labs Lab 10/01/12 0510 10/02/12 0510 10/03/12 0535 10/04/12 0530 10/05/12 0500   WBC 5.4 5.8 5.6 6.7 6.0  HGB 8.4* 8.3* 9.2* 8.4* 8.4*  HCT 27.1* 26.9* 29.9* 27.6* 26.3*  MCV 75.5* 75.1* 75.5* 76.0* 74.1*  PLT 317 309 330 344 308   Cardiac Enzymes: No results found for this basename: CKTOTAL, CKMB, CKMBINDEX, TROPONINI,  in the last 168 hours BNP: BNP (last 3 results)  Recent Labs  08/21/12 1219 09/19/12 1300 09/20/12 0527  PROBNP 516.5* 681.2* 796.8*   CBG:  Recent Labs Lab 10/04/12 1107 10/04/12 1602 10/04/12 2039 10/05/12 0619 10/05/12 1131  GLUCAP 140* 125* 143* 112* 103*  Signed:  Rock Sobol,CHRISTOPHER  [facility] 10/05/2012, 2:12 PM

## 2012-10-05 NOTE — Progress Notes (Deleted)
Patient Name: [name] Date of Encounter: 10/05/2012    SUBJECTIVE:Maintaining NSR  TELEMETRY:  SB/NSR: Filed Vitals:   10/04/12 1422 10/04/12 2012 10/04/12 2330 10/05/12 0544  BP: 102/69 120/61  121/70  Pulse: 56 60 66 70  Temp: 98.5 F (36.9 C) 98.7 F (37.1 C)  98.3 F (36.8 C)  TempSrc: Oral Oral  Oral  Resp: 18 16 16 16   Height:      Weight:    100.7 kg (222 lb 0.1 oz)  SpO2: 92% 95% 95% 92%    Intake/Output Summary (Last 24 hours) at 10/05/12 0928 Last data filed at 10/05/12 0622  Gross per 24 hour  Intake    480 ml  Output   1325 ml  Net   -845 ml    LABS: Basic Metabolic Panel:  Recent Labs  11/91/47 0535 10/05/12 0500  NA 138 138  K 4.9 4.5  CL 100 103  CO2 23 31  GLUCOSE 120* 112*  BUN 15 18  CREATININE 1.27 1.54*  CALCIUM 8.8 8.9   CBC:  Recent Labs  10/04/12 0530 10/05/12 0500  WBC 6.7 6.0  HGB 8.4* 8.4*  HCT 27.6* 26.3*  MCV 76.0* 74.1*  PLT 344 308    Physical Exam: Blood pressure 121/70, pulse 70, temperature 98.3 F (36.8 C), temperature source Oral, resp. rate 16, height 5\' 7"  (1.702 m), weight 100.7 kg (222 lb 0.1 oz), SpO2 92.00%. Weight change: -0.3 kg (-10.6 oz)   Not examined  ASSESSMENT:  1. Maintaining NSR on Amio  Plan:  1. Will need f/u with Dr Shela Commons. Eldridge Dace. Whether or not to anticoagulate will be determined at later date. See Dr. Hedy Jacob note of 10/02/12/for d/c instructions.  Selinda Eon 10/05/2012, 9:28 AM

## 2012-10-05 NOTE — Progress Notes (Signed)
Occupational Therapy Treatment Patient Details Name: [name] MRN: [id number] DOB: 1940-11-16 Today's Date: 10/05/2012 Time: 1030-1053 OT Time Calculation (min): 23 min  OT Assessment / Plan / Recommendation Comments on Treatment Session Patient has made good progress toward ADL goals and is functioning at S level with ADL performance and mobility with RW. Patient has friend who will provide S at discharge and patient states he is supposed to go home today.    Follow Up Recommendations  Home health OT    Barriers to Discharge       Equipment Recommendations  Tub/shower bench    Recommendations for Other Services    Frequency Min 2X/week   Plan Discharge plan remains appropriate    Precautions / Restrictions Precautions Precautions: Fall Restrictions Weight Bearing Restrictions: No   Pertinent Vitals/Pain     ADL  Grooming: Performed;Supervision/safety Where Assessed - Grooming: Unsupported standing Upper Body Bathing: Simulated;Set up Where Assessed - Upper Body Bathing: Unsupported sitting Lower Body Bathing: Performed;Set up Where Assessed - Lower Body Bathing: Unsupported sit to stand Toilet Transfer: Supervision/safety Toilet Transfer Method:  (regular commode; RW to amb to/from bathroom) Toilet Transfer Equipment: Regular height toilet Toileting - Clothing Manipulation and Hygiene: Performed;Supervision/safety Where Assessed - Engineer, mining and Hygiene: Standing Tub/Shower Transfer: Engineer, manufacturing Method: Science writer: Walk in shower;Grab bars Equipment Used: Gait belt;Rolling walker Transfers/Ambulation Related to ADLs: Patient performed ADL transfers and ambulation with RW in room and hallway with S overall. ADL Comments: Patient making good progress with OT, performs ADLs at S level.    OT Diagnosis:    OT Problem List:   OT Treatment Interventions:     OT Goals ADL  Goals ADL Goal: Grooming - Progress: Progressing toward goals ADL Goal: Upper Body Bathing - Progress: Progressing toward goals ADL Goal: Lower Body Bathing - Progress: Progressing toward goals ADL Goal: Upper Body Dressing - Progress: Progressing toward goals ADL Goal: Lower Body Dressing - Progress: Progressing toward goals ADL Goal: Toilet Transfer - Progress: Progressing toward goals ADL Goal: Toileting - Clothing Manipulation - Progress: Progressing toward goals ADL Goal: Toileting - Hygiene - Progress: Progressing toward goals  Visit Information  Last OT Received On: 10/05/12 Assistance Needed: +1    Subjective Data      Prior Functioning       Cognition  Cognition Arousal/Alertness: Awake/alert Behavior During Therapy: WFL for tasks assessed/performed Overall Cognitive Status: Within Functional Limits for tasks assessed    Mobility  Transfers Sit to Stand: 5: Supervision Stand to Sit: 5: Supervision    Exercises      Balance     End of Session OT - End of Session Equipment Utilized During Treatment: Gait belt Activity Tolerance: Patient tolerated treatment well Patient left: Other (comment) (with PT in his room)  GO     [name] A 10/05/2012, 11:56 AM

## 2012-10-05 NOTE — Progress Notes (Deleted)
Patient Name: [name] Date of Encounter: 10/05/2012    SUBJECTIVE: No cardiac complaints this AM  TELEMETRY:  NSR Filed Vitals:   10/04/12 1422 10/04/12 2012 10/04/12 2330 10/05/12 0544  BP: 102/69 120/61  121/70  Pulse: 56 60 66 70  Temp: 98.5 F (36.9 C) 98.7 F (37.1 C)  98.3 F (36.8 C)  TempSrc: Oral Oral  Oral  Resp: 18 16 16 16   Height:      Weight:    100.7 kg (222 lb 0.1 oz)  SpO2: 92% 95% 95% 92%    Intake/Output Summary (Last 24 hours) at 10/05/12 0946 Last data filed at 10/05/12 0622  Gross per 24 hour  Intake    480 ml  Output   1325 ml  Net   -845 ml    LABS: Basic Metabolic Panel:  Recent Labs  16/10/96 0535 10/05/12 0500  NA 138 138  K 4.9 4.5  CL 100 103  CO2 23 31  GLUCOSE 120* 112*  BUN 15 18  CREATININE 1.27 1.54*  CALCIUM 8.8 8.9   CBC:  Recent Labs  10/04/12 0530 10/05/12 0500  WBC 6.7 6.0  HGB 8.4* 8.4*  HCT 27.6* 26.3*  MCV 76.0* 74.1*  PLT 344 308     Radiology/Studies:  No new  Physical Exam: Blood pressure 121/70, pulse 70, temperature 98.3 F (36.8 C), temperature source Oral, resp. rate 16, height 5\' 7"  (1.702 m), weight 100.7 kg (222 lb 0.1 oz), SpO2 92.00%. Weight change: -0.3 kg (-10.6 oz)   Normal CV exam  ASSESSMENT:  1. PAF, now in NSR  Plan:  1. Amio x 24 hours then convert to PO and maintain for 3-6 weeks and stop. 2. F/u per Dr. Mayford Knife.  Selinda Eon 10/05/2012, 9:46 AM

## 2012-10-08 DIAGNOSIS — J189 Pneumonia, unspecified organism: Secondary | ICD-10-CM

## 2012-10-08 HISTORY — DX: Pneumonia, unspecified organism: J18.9

## 2012-10-18 ENCOUNTER — Emergency Department (HOSPITAL_COMMUNITY)
Admission: EM | Admit: 2012-10-18 | Discharge: 2012-10-18 | Disposition: A | Discharge status: Home Health | Payer: Medicare Other | Attending: Emergency Medicine | Admitting: Emergency Medicine

## 2012-10-18 ENCOUNTER — Encounter (HOSPITAL_COMMUNITY): Payer: Self-pay

## 2012-10-18 ENCOUNTER — Emergency Department (HOSPITAL_COMMUNITY): Payer: Medicare Other

## 2012-10-18 DIAGNOSIS — I4891 Unspecified atrial fibrillation: Secondary | ICD-10-CM | POA: Insufficient documentation

## 2012-10-18 DIAGNOSIS — Z8701 Personal history of pneumonia (recurrent): Secondary | ICD-10-CM | POA: Insufficient documentation

## 2012-10-18 DIAGNOSIS — Z862 Personal history of diseases of the blood and blood-forming organs and certain disorders involving the immune mechanism: Secondary | ICD-10-CM | POA: Insufficient documentation

## 2012-10-18 DIAGNOSIS — E119 Type 2 diabetes mellitus without complications: Secondary | ICD-10-CM | POA: Insufficient documentation

## 2012-10-18 DIAGNOSIS — Z79899 Other long term (current) drug therapy: Secondary | ICD-10-CM | POA: Insufficient documentation

## 2012-10-18 DIAGNOSIS — Z7982 Long term (current) use of aspirin: Secondary | ICD-10-CM | POA: Insufficient documentation

## 2012-10-18 DIAGNOSIS — I10 Essential (primary) hypertension: Secondary | ICD-10-CM | POA: Insufficient documentation

## 2012-10-18 DIAGNOSIS — Z8589 Personal history of malignant neoplasm of other organs and systems: Secondary | ICD-10-CM | POA: Insufficient documentation

## 2012-10-18 DIAGNOSIS — Z8669 Personal history of other diseases of the nervous system and sense organs: Secondary | ICD-10-CM | POA: Insufficient documentation

## 2012-10-18 DIAGNOSIS — M7989 Other specified soft tissue disorders: Secondary | ICD-10-CM | POA: Insufficient documentation

## 2012-10-18 DIAGNOSIS — Z8679 Personal history of other diseases of the circulatory system: Secondary | ICD-10-CM | POA: Insufficient documentation

## 2012-10-18 DIAGNOSIS — Z8639 Personal history of other endocrine, nutritional and metabolic disease: Secondary | ICD-10-CM | POA: Insufficient documentation

## 2012-10-18 DIAGNOSIS — Z794 Long term (current) use of insulin: Secondary | ICD-10-CM | POA: Insufficient documentation

## 2012-10-18 DIAGNOSIS — R5381 Other malaise: Secondary | ICD-10-CM | POA: Insufficient documentation

## 2012-10-18 DIAGNOSIS — I2510 Atherosclerotic heart disease of native coronary artery without angina pectoris: Secondary | ICD-10-CM | POA: Insufficient documentation

## 2012-10-18 DIAGNOSIS — Z8719 Personal history of other diseases of the digestive system: Secondary | ICD-10-CM | POA: Insufficient documentation

## 2012-10-18 DIAGNOSIS — D649 Anemia, unspecified: Secondary | ICD-10-CM | POA: Insufficient documentation

## 2012-10-18 LAB — CBC WITH DIFFERENTIAL/PLATELET
Basophils Absolute: 0.1 10*3/uL (ref 0.0–0.1)
Eosinophils Relative: 5 % (ref 0–5)
Lymphocytes Relative: 34 % (ref 12–46)
MCV: 73.9 fL — ABNORMAL LOW (ref 78.0–100.0)
Monocytes Relative: 12 % (ref 3–12)
Neutrophils Relative %: 48 % (ref 43–77)
Platelets: 218 10*3/uL (ref 150–400)
RBC: 3.37 MIL/uL — ABNORMAL LOW (ref 4.22–5.81)
WBC: 5.4 10*3/uL (ref 4.0–10.5)

## 2012-10-18 LAB — COMPREHENSIVE METABOLIC PANEL
ALT: 12 U/L (ref 0–53)
AST: 12 U/L (ref 0–37)
Albumin: 3.1 g/dL — ABNORMAL LOW (ref 3.5–5.2)
CO2: 27 mEq/L (ref 19–32)
Chloride: 101 mEq/L (ref 96–112)
GFR calc non Af Amer: 50 mL/min — ABNORMAL LOW (ref >90)
Sodium: 137 mEq/L (ref 135–145)
Total Bilirubin: 0.2 mg/dL — ABNORMAL LOW (ref 0.3–1.2)

## 2012-10-18 MED ORDER — SODIUM CHLORIDE 0.9 % IV BOLUS (SEPSIS)
500.0000 mL | Freq: Once | INTRAVENOUS | Status: AC
  Administered 2012-10-18: 500 mL via INTRAVENOUS

## 2012-10-18 NOTE — ED Notes (Signed)
Pt states he has been having low BP. States it was checked by EMS and was told to get out of the sun.

## 2012-10-18 NOTE — ED Provider Notes (Signed)
History  This chart was scribed for Bobby Lennert, MD by Shari Heritage, ED Scribe. The patient was seen in room APA11/APA11. Patient's care was started at 1403.   CSN: [id number]  Arrival date & time 10/18/12  1353   First MD Initiated Contact with Patient 10/18/12 1403      Chief Complaint  Patient presents with  . Hypotension    Patient is a 72 y.o. male presenting with weakness. The history is provided by the patient. No language interpreter was used.  Weakness This is a new problem. The current episode started more than 2 days ago. The problem occurs constantly. The problem has not changed since onset.Pertinent negatives include no chest pain, no abdominal pain and no headaches. Nothing aggravates the symptoms. Nothing relieves the symptoms. He has tried nothing for the symptoms.    HPI Comments: [name] is a 72 y.o. male who presents to the Emergency Department complaining of hypotension and constant, unchanged generalized weakness for the past several days. Patient states that he had his BP checked earlier today and it was still low, so he decided to come to the ED for further evaluation. Pt also complains of blurred vision. Pt has baseline swelling in both legs bilaterally. He denies any other symptoms at this time. Pt has past medical history of hypertension, high cholesterol, coronary heart disease, CHF, diabetes, atrial fibrillation and kidney cancer. Pt is a former smoker.  Past Medical History  Diagnosis Date  . Coronary artery disease   . Hypertension   . Pneumonia   . CHF (congestive heart failure)   . High cholesterol   . Diabetes mellitus   . LBBB (left bundle branch block)   . Paroxysmal atrial fibrillation   . Sleep apnea   . Renal disorder     kidney cancer  . Cancer   . Hepatitis     Past Surgical History  Procedure Laterality Date  . Back surgery    . Coronary angioplasty with stent placement    . Nephrectomy    . Esophagogastroduodenoscopy Left  10/02/2012    Procedure: ESOPHAGOGASTRODUODENOSCOPY (EGD);  Surgeon: Willis Modena, MD;  Location: [facility] ENDOSCOPY;  Service: Endoscopy;  Laterality: Left;  . Colonoscopy Left 10/03/2012    Procedure: COLONOSCOPY;  Surgeon: Shirley Friar, MD;  Location: [facility] ENDOSCOPY;  Service: Endoscopy;  Laterality: Left;    Family History  Problem Relation Age of Onset  . Hyperlipidemia Mother   . Heart attack Father   . Diabetes Mother     History  Substance Use Topics  . Smoking status: Former Games developer  . Smokeless tobacco: Not on file  . Alcohol Use: No      Review of Systems  Constitutional: Positive for fatigue. Negative for appetite change.  HENT: Negative for congestion, sinus pressure and ear discharge.   Eyes: Negative for discharge.  Respiratory: Negative for cough.   Cardiovascular: Negative for chest pain.  Gastrointestinal: Negative for abdominal pain and diarrhea.  Genitourinary: Negative for frequency and hematuria.  Musculoskeletal: Negative for back pain.  Skin: Negative for rash.  Neurological: Positive for weakness. Negative for seizures and headaches.  Psychiatric/Behavioral: Negative for hallucinations.    Allergies  Review of patient's allergies indicates no known allergies.  Home Medications   Current Outpatient Rx  Name  Route  Sig  Dispense  Refill  . amiodarone (PACERONE) 400 MG tablet   Oral   Take 1 tablet (400 mg total) by mouth daily.   30 tablet  0   . aspirin EC 81 MG EC tablet   Oral   Take 1 tablet (81 mg total) by mouth daily.   100 tablet   0   . diltiazem (CARDIZEM CD) 360 MG 24 hr capsule   Oral   Take 1 capsule (360 mg total) by mouth daily.   30 capsule   0   . insulin glargine (LANTUS) 100 UNIT/ML injection   Subcutaneous   Inject 0.1 mLs (10 Units total) into the skin at bedtime.   10 mL   0   . ipratropium (ATROVENT) 0.02 % nebulizer solution   Nebulization   Take 0.5 mg by nebulization every 6 (six) hours as needed.  Shortness of breath         . iron polysaccharides (NIFEREX) 150 MG capsule   Oral   Take 1 capsule (150 mg total) by mouth 2 (two) times daily.   60 capsule   4   . methadone (DOLOPHINE) 5 MG tablet   Oral   Take 5 mg by mouth daily.         . metoprolol (LOPRESSOR) 50 MG tablet   Oral   Take 1 tablet (50 mg total) by mouth 2 (two) times daily.   60 tablet   3   . nitroGLYCERIN (NITROSTAT) 0.4 MG SL tablet   Sublingual   Place 0.4 mg under the tongue every 5 (five) minutes as needed. Chest pain          . pantoprazole (PROTONIX) 40 MG tablet   Oral   Take 40 mg by mouth daily.         . QUEtiapine (SEROQUEL) 50 MG tablet   Oral   Take 1 tablet (50 mg total) by mouth at bedtime.   30 tablet   0   . tamsulosin (FLOMAX) 0.4 MG CAPS   Oral   Take 1 capsule (0.4 mg total) by mouth daily after supper.   30 capsule   3     Triage Vitals: BP 113/54  Pulse 74  Temp(Src) 98.7 F (37.1 C)  Resp 18  Ht 5' 7.5" (1.715 m)  Wt 202 lb (91.627 kg)  BMI 31.15 kg/m2  SpO2 99%  Physical Exam  Constitutional: He is oriented to person, place, and time. He appears well-developed and well-nourished.  HENT:  Head: Normocephalic.  Eyes: Conjunctivae and EOM are normal. No scleral icterus.  Neck: Neck supple. No thyromegaly present.  Cardiovascular: Normal rate and regular rhythm.  Exam reveals no gallop and no friction rub.   No murmur heard. Pulmonary/Chest: No stridor. He has no wheezes. He has no rales. He exhibits no tenderness.  Abdominal: He exhibits no distension. There is no tenderness. There is no rebound.  Musculoskeletal: Normal range of motion. He exhibits edema.  +1 edema in ankles bilaterally.  Lymphadenopathy:    He has no cervical adenopathy.  Neurological: He is oriented to person, place, and time. Coordination normal.  Skin: No rash noted. No erythema.  Psychiatric: He has a normal mood and affect. His behavior is normal.    ED Course  Procedures  (including critical care time) DIAGNOSTIC STUDIES: Oxygen Saturation is 97% on room air, adequate by my interpretation.    COORDINATION OF CARE: 2:10 PM- Patient informed of current plan for treatment and evaluation and agrees with plan at this time.   Labs Reviewed  CBC WITH DIFFERENTIAL  COMPREHENSIVE METABOLIC PANEL  TROPONIN I    No results found.   No  diagnosis found.   Date: 10/18/2012  Rate: 73  Rhythm: normal sinus rhythm  QRS Axis: normal  Intervals: normal  ST/T Wave abnormalities: nonspecific ST changes  Conduction Disutrbances:left bundle branch block  Narrative Interpretation:   Old EKG Reviewed: unchanged    MDM  Pt states his bp was low today.  His bp has been normal for 4 hours in the er.  He has a hx of anemia and is seeing gi tomorrow.  He also saw cards last week and they stopped his aspirin and one bp med.       Bobby Lennert, MD 10/18/12 1759

## 2012-10-29 ENCOUNTER — Encounter (HOSPITAL_COMMUNITY): Payer: Self-pay | Admitting: *Deleted

## 2012-10-29 ENCOUNTER — Emergency Department (HOSPITAL_COMMUNITY)
Admission: EM | Admit: 2012-10-29 | Discharge: 2012-10-29 | Disposition: A | Discharge status: Home / Self Care | Payer: Medicare Other | Attending: Emergency Medicine | Admitting: Emergency Medicine

## 2012-10-29 ENCOUNTER — Emergency Department (HOSPITAL_COMMUNITY): Payer: Medicare Other

## 2012-10-29 DIAGNOSIS — Z862 Personal history of diseases of the blood and blood-forming organs and certain disorders involving the immune mechanism: Secondary | ICD-10-CM | POA: Insufficient documentation

## 2012-10-29 DIAGNOSIS — Z9981 Dependence on supplemental oxygen: Secondary | ICD-10-CM | POA: Insufficient documentation

## 2012-10-29 DIAGNOSIS — Z87891 Personal history of nicotine dependence: Secondary | ICD-10-CM | POA: Insufficient documentation

## 2012-10-29 DIAGNOSIS — Z79899 Other long term (current) drug therapy: Secondary | ICD-10-CM | POA: Insufficient documentation

## 2012-10-29 DIAGNOSIS — I4891 Unspecified atrial fibrillation: Secondary | ICD-10-CM | POA: Insufficient documentation

## 2012-10-29 DIAGNOSIS — G4730 Sleep apnea, unspecified: Secondary | ICD-10-CM | POA: Insufficient documentation

## 2012-10-29 DIAGNOSIS — I509 Heart failure, unspecified: Secondary | ICD-10-CM | POA: Insufficient documentation

## 2012-10-29 DIAGNOSIS — Z85528 Personal history of other malignant neoplasm of kidney: Secondary | ICD-10-CM | POA: Insufficient documentation

## 2012-10-29 DIAGNOSIS — I2510 Atherosclerotic heart disease of native coronary artery without angina pectoris: Secondary | ICD-10-CM | POA: Insufficient documentation

## 2012-10-29 DIAGNOSIS — Z794 Long term (current) use of insulin: Secondary | ICD-10-CM | POA: Insufficient documentation

## 2012-10-29 DIAGNOSIS — J441 Chronic obstructive pulmonary disease with (acute) exacerbation: Secondary | ICD-10-CM | POA: Insufficient documentation

## 2012-10-29 DIAGNOSIS — Z8679 Personal history of other diseases of the circulatory system: Secondary | ICD-10-CM | POA: Insufficient documentation

## 2012-10-29 DIAGNOSIS — E119 Type 2 diabetes mellitus without complications: Secondary | ICD-10-CM | POA: Insufficient documentation

## 2012-10-29 DIAGNOSIS — Z8619 Personal history of other infectious and parasitic diseases: Secondary | ICD-10-CM | POA: Insufficient documentation

## 2012-10-29 DIAGNOSIS — Z8701 Personal history of pneumonia (recurrent): Secondary | ICD-10-CM | POA: Insufficient documentation

## 2012-10-29 DIAGNOSIS — Z8639 Personal history of other endocrine, nutritional and metabolic disease: Secondary | ICD-10-CM | POA: Insufficient documentation

## 2012-10-29 DIAGNOSIS — Z9861 Coronary angioplasty status: Secondary | ICD-10-CM | POA: Insufficient documentation

## 2012-10-29 DIAGNOSIS — I10 Essential (primary) hypertension: Secondary | ICD-10-CM | POA: Insufficient documentation

## 2012-10-29 LAB — URINALYSIS, ROUTINE W REFLEX MICROSCOPIC
Bilirubin Urine: NEGATIVE
Glucose, UA: NEGATIVE mg/dL
Ketones, ur: NEGATIVE mg/dL
Leukocytes, UA: NEGATIVE
Protein, ur: 30 mg/dL — AB
pH: 6 (ref 5.0–8.0)

## 2012-10-29 LAB — CBC WITH DIFFERENTIAL/PLATELET
Eosinophils Absolute: 0.2 10*3/uL (ref 0.0–0.7)
Eosinophils Relative: 5 % (ref 0–5)
HCT: 25.6 % — ABNORMAL LOW (ref 39.0–52.0)
Lymphocytes Relative: 21 % (ref 12–46)
Lymphs Abs: 1.1 10*3/uL (ref 0.7–4.0)
MCH: 22.8 pg — ABNORMAL LOW (ref 26.0–34.0)
MCV: 76 fL — ABNORMAL LOW (ref 78.0–100.0)
Monocytes Absolute: 0.6 10*3/uL (ref 0.1–1.0)
Platelets: 223 10*3/uL (ref 150–400)
RBC: 3.37 MIL/uL — ABNORMAL LOW (ref 4.22–5.81)
RDW: 16.9 % — ABNORMAL HIGH (ref 11.5–15.5)

## 2012-10-29 LAB — BASIC METABOLIC PANEL
Calcium: 8.9 mg/dL (ref 8.4–10.5)
GFR calc Af Amer: 64 mL/min — ABNORMAL LOW (ref >90)
GFR calc non Af Amer: 55 mL/min — ABNORMAL LOW (ref >90)
Glucose, Bld: 187 mg/dL — ABNORMAL HIGH (ref 70–99)
Potassium: 4.3 mEq/L (ref 3.5–5.1)
Sodium: 142 mEq/L (ref 135–145)

## 2012-10-29 LAB — PRO B NATRIURETIC PEPTIDE: Pro B Natriuretic peptide (BNP): 584.2 pg/mL — ABNORMAL HIGH (ref 0–125)

## 2012-10-29 LAB — TROPONIN I: Troponin I: <0.3 ng/mL (ref <0.30)

## 2012-10-29 LAB — URINE MICROSCOPIC-ADD ON

## 2012-10-29 MED ORDER — FUROSEMIDE 10 MG/ML IJ SOLN
40.0000 mg | Freq: Once | INTRAMUSCULAR | Status: AC
  Administered 2012-10-29: 40 mg via INTRAVENOUS
  Filled 2012-10-29: qty 4

## 2012-10-29 NOTE — ED Provider Notes (Signed)
History    This chart was scribed for Flint Melter, MD by Quintella Reichert, ED scribe.  This patient was seen in room APA10/APA10 and the patient's care was started at 1:40 PM.   CSN: [id number]  Arrival date & time 10/29/12  1306      Chief Complaint  Patient presents with  . Shortness of Breath     HPI  HPI Comments: [name] is a 72 y.o. male with h/o bronchitis, COPD, CHF, LBBB, CAD and A-fib who presents to the Emergency Department complaining of SOB that began "a while" ago but became more severe in the past several days, with accompanying swelling to the feet and eyes.  Pt states that SOB comes on with walking and other forms of exertion.  He denies recent weight gains that may be related to swelling.  He denies over-eating.  Pt was discharged on 10/05/12 after being hospitalized for acute respiratory failure secondary to pulmonary edema and community-acquired pneumonia.  He states that his PCP recently advised a change in heart medicine.  He was prescribed Lasix, but it has not been delivered yet. He denies appetite change, fever, cough, CP, abdominal pain, dizziness, weakness.  Pt states he uses home oxygen day and night for the past couple days.  He denies h/o smoking.  He notes that he lives by himself but receives home care 3 times per week to have his blood checked.     Past Medical History  Diagnosis Date  . Coronary artery disease   . Hypertension   . Pneumonia   . CHF (congestive heart failure)   . High cholesterol   . Diabetes mellitus   . LBBB (left bundle branch block)   . Paroxysmal atrial fibrillation   . Sleep apnea   . Renal disorder     kidney cancer  . Cancer   . Hepatitis     Past Surgical History  Procedure Laterality Date  . Back surgery    . Coronary angioplasty with stent placement    . Nephrectomy    . Esophagogastroduodenoscopy Left 10/02/2012    Procedure: ESOPHAGOGASTRODUODENOSCOPY (EGD);  Surgeon: Willis Modena, MD;  Location: [facility]  ENDOSCOPY;  Service: Endoscopy;  Laterality: Left;  . Colonoscopy Left 10/03/2012    Procedure: COLONOSCOPY;  Surgeon: Shirley Friar, MD;  Location: [facility] ENDOSCOPY;  Service: Endoscopy;  Laterality: Left;    Family History  Problem Relation Age of Onset  . Hyperlipidemia Mother   . Heart attack Father   . Diabetes Mother     History  Substance Use Topics  . Smoking status: Former Games developer  . Smokeless tobacco: Not on file  . Alcohol Use: No      Review of Systems A complete 10 system review of systems was obtained and all systems are negative except as noted in the HPI and PMH.    Allergies  Review of patient's allergies indicates no known allergies.  Home Medications   Current Outpatient Rx  Name  Route  Sig  Dispense  Refill  . amiodarone (PACERONE) 200 MG tablet   Oral   Take 200 mg by mouth daily.         Marland Kitchen diltiazem (CARDIZEM CD) 360 MG 24 hr capsule   Oral   Take 1 capsule (360 mg total) by mouth daily.   30 capsule   0   . insulin glargine (LANTUS) 100 UNIT/ML injection   Subcutaneous   Inject 0.1 mLs (10 Units total) into the skin  at bedtime.   10 mL   0   . ipratropium (ATROVENT) 0.02 % nebulizer solution   Nebulization   Take 0.5 mg by nebulization every 6 (six) hours as needed. Shortness of breath         . methadone (DOLOPHINE) 5 MG tablet   Oral   Take 5 mg by mouth daily as needed for pain.          . metoprolol (LOPRESSOR) 50 MG tablet   Oral   Take 1 tablet (50 mg total) by mouth 2 (two) times daily.   60 tablet   3   . pantoprazole (PROTONIX) 40 MG tablet   Oral   Take 40 mg by mouth daily.         . QUEtiapine (SEROQUEL) 50 MG tablet   Oral   Take 1 tablet (50 mg total) by mouth at bedtime.   30 tablet   0   . tamsulosin (FLOMAX) 0.4 MG CAPS   Oral   Take 1 capsule (0.4 mg total) by mouth daily after supper.   30 capsule   3   . nitroGLYCERIN (NITROSTAT) 0.4 MG SL tablet   Sublingual   Place 0.4 mg under the  tongue every 5 (five) minutes as needed. Chest pain            BP 136/58  Pulse 83  Temp(Src) 98.3 F (36.8 C) (Oral)  Ht 5' 7.5" (1.715 m)  Wt 202 lb (91.627 kg)  BMI 31.15 kg/m2  SpO2 96%  Physical Exam  Nursing note and vitals reviewed. Constitutional: He is oriented to person, place, and time. He appears well-developed and well-nourished.  HENT:  Head: Normocephalic and atraumatic.  Right Ear: External ear normal.  Left Ear: External ear normal.  Mild periorbital edema No angioedema of the oropharynx  Eyes: Conjunctivae and EOM are normal. Pupils are equal, round, and reactive to light.  Neck: Normal range of motion and phonation normal. Neck supple.  Cardiovascular: Normal rate, regular rhythm, normal heart sounds and intact distal pulses.   No murmur heard. Pulmonary/Chest: Effort normal and breath sounds normal. He has no wheezes. He has no rales. He exhibits no tenderness and no bony tenderness.  Scattered rhonchi. Decreased air movement bilaterally.  Abdominal: Soft. Normal appearance. There is no tenderness.  Genitourinary: Penis normal.  Mild scrotal edema Testes normal  Musculoskeletal: Normal range of motion. He exhibits edema (3+ edema lower legs bilaterally) and tenderness (Mild left calf tenderness).  Legs are symmetric  Neurological: He is alert and oriented to person, place, and time. He has normal strength. No cranial nerve deficit or sensory deficit. He exhibits normal muscle tone. Coordination normal.  Skin: Skin is warm, dry and intact.  Psychiatric: He has a normal mood and affect. His behavior is normal. Judgment and thought content normal.    ED Course  Procedures (including critical care time)  Medications  furosemide (LASIX) injection 40 mg (40 mg Intravenous Given 10/29/12 1607)    Patient Vitals for the past 24 hrs:  BP Temp Temp src Pulse Resp SpO2 Height Weight  10/29/12 1648 163/75 mmHg - - 80 20 96 % - -  10/29/12 1512 171/116 mmHg -  - 79 18 96 % - -  10/29/12 1311 136/58 mmHg 98.3 F (36.8 C) Oral 83 - 96 % 5' 7.5" (1.715 m) 202 lb (91.627 kg)    DIAGNOSTIC STUDIES: Oxygen Saturation is 96% on room air, normal by my interpretation.    COORDINATION  OF CARE: 1:50 PM-Discussed treatment plan which includes labs and CXR with pt at bedside and pt agreed to plan.   3:35 PM: Pt notes that he was prescribed a diuretic medication by his cardiologist but has not begun medicating yet because of an issue getting the medication from the pharmacy.  Presently pt states that he is not feeling SOB.  Discussed treatment plan including administering Lasix 40 mg injection, to treat CHH and HTN, in ED and pt agreed to plan. BP: 171/117 O2Sat: 97% on 2 Liters [place], normal  5:08 PM Reevaluation with update and discussion. After initial assessment and treatment, an updated evaluation reveals he feels better. He has voided 750 cc of urine. Repeat BP improved. His Pharmacy delivered his Lasix Prescription here- 40 mg q day. Kaysi Ourada L     Date: 10/29/12  Rate: 79  Rhythm: normal sinus rhythm  QRS Axis: normal  PR and QT Intervals: normal  ST/T Wave abnormalities: normal  PR and QRS Conduction Disutrbances:left bundle branch block  Narrative Interpretation:   Old EKG Reviewed: unchanged    CRITICAL CARE Performed by: Mancel Bale L Total critical care time: 30 minutes Critical care time was exclusive of separately billable procedures and treating other patients. Critical care was necessary to treat or prevent imminent or life-threatening deterioration. Critical care was time spent personally by me on the following activities: development of treatment plan with patient and/or surrogate as well as nursing, discussions with consultants, evaluation of patient's response to treatment, examination of patient, obtaining history from patient or surrogate, ordering and performing treatments and interventions, ordering and review of laboratory  studies, ordering and review of radiographic studies, pulse oximetry and re-evaluation of patient's condition.  Labs Reviewed  CBC WITH DIFFERENTIAL - Abnormal; Notable for the following:    RBC 3.37 (*)    Hemoglobin 7.7 (*)    HCT 25.6 (*)    MCV 76.0 (*)    MCH 22.8 (*)    RDW 16.9 (*)    All other components within normal limits  URINALYSIS, ROUTINE W REFLEX MICROSCOPIC - Abnormal; Notable for the following:    Specific Gravity, Urine >1.030 (*)    Protein, ur 30 (*)    All other components within normal limits  BASIC METABOLIC PANEL - Abnormal; Notable for the following:    Glucose, Bld 187 (*)    GFR calc non Af Amer 55 (*)    GFR calc Af Amer 64 (*)    All other components within normal limits  PRO B NATRIURETIC PEPTIDE - Abnormal; Notable for the following:    Pro B Natriuretic peptide (BNP) 584.2 (*)    All other components within normal limits  URINE CULTURE  TROPONIN I  URINE MICROSCOPIC-ADD ON   Dg Chest Port 1 View  10/29/2012   *RADIOLOGY REPORT*  Clinical Data: Shortness of breath.  Prior history of CHF.  Recent pneumonia.  PORTABLE CHEST - 1 VIEW 10/29/2012 1357 hours:  Comparison: Portable chest x-ray 10/18/2012, 09/20/2012 and two- view chest x-ray 09/04/2012.  Findings: Cardiac silhouette moderately enlarged but stable.  Mild diffuse interstitial pulmonary edema, slightly increased since the examination 11 days ago.  No confluent airspace consolidation. Probable small bilateral pleural effusions.  IMPRESSION: Mild CHF, with stable cardiomegaly and mild diffuse interstitial pulmonary edema, increased since the examination 11 days ago.   Original Report Authenticated By: Hulan Saas, M.D.     1. CHF (congestive heart failure)   2. HTN (hypertension)  MDM  Shortness of breath with dyspnea on exertion, secondary to mild congestive heart failure. He's improved with treatment in the emergency department, which was to start his previously prescribed, Lasix.  Doubt ACS, PE, or pneumonia. No apparent metabolic instability, or suspected infection. He is stable for discharge.  Nursing Notes Reviewed/ Care Coordinated Applicable Imaging Reviewed Interpretation of Laboratory Data incorporated into ED treatment    Plan: Home Medications- usual, including Lasix; Home Treatments- rest; Recommended follow up- PCP and Cardiology 1 week  I personally performed the services described in this documentation, which was scribed in my presence. The recorded information has been reviewed and is accurate.       Flint Melter, MD 10/29/12 [phone]

## 2012-10-29 NOTE — ED Notes (Signed)
Pt dyspneic with minimal exertion (scooting self up in bed).

## 2012-10-29 NOTE — ED Notes (Signed)
Pt c/o SOB x2-3 days. Pt states this morning symptoms became worse. Pt denies any pain.

## 2012-10-29 NOTE — ED Notes (Signed)
C/o shortness of breath x 3 days; denies pain. Reports improved after being on oxygen 4L/min en route.

## 2012-10-31 LAB — URINE CULTURE
Colony Count: NO GROWTH
Culture: NO GROWTH

## 2013-01-06 ENCOUNTER — Encounter (HOSPITAL_COMMUNITY): Payer: Self-pay | Admitting: Pharmacy Technician

## 2013-01-06 ENCOUNTER — Encounter (HOSPITAL_COMMUNITY): Payer: Self-pay | Admitting: *Deleted

## 2013-01-11 ENCOUNTER — Encounter (HOSPITAL_COMMUNITY): Payer: Self-pay | Admitting: *Deleted

## 2013-01-18 NOTE — Progress Notes (Addendum)
Pt had requested leave pre op instructions with friend donna brown phone number [phone], left message with friend donna brown by phone on 01-07-2013, 01-11-2013, 01-12-2013, left message for pt on 01-14-13 to call back and get preop instructions, no phone calls returned by pt or friend donna brown, left pre op instructions for pt on voicemail of cell phone

## 2013-01-24 ENCOUNTER — Emergency Department (HOSPITAL_COMMUNITY): Payer: Medicare Other

## 2013-01-24 ENCOUNTER — Other Ambulatory Visit: Payer: Self-pay

## 2013-01-24 ENCOUNTER — Inpatient Hospital Stay (HOSPITAL_COMMUNITY): Payer: Medicare Other

## 2013-01-24 ENCOUNTER — Encounter (HOSPITAL_COMMUNITY): Payer: Self-pay | Admitting: *Deleted

## 2013-01-24 ENCOUNTER — Inpatient Hospital Stay (HOSPITAL_COMMUNITY)
Admission: EM | Admit: 2013-01-24 | Discharge: 2013-01-27 | DRG: 639 | Disposition: A | Discharge status: Home / Self Care | Payer: Medicare Other | Attending: Family Medicine | Admitting: Family Medicine

## 2013-01-24 DIAGNOSIS — K429 Umbilical hernia without obstruction or gangrene: Secondary | ICD-10-CM | POA: Diagnosis present

## 2013-01-24 DIAGNOSIS — IMO0001 Reserved for inherently not codable concepts without codable children: Principal | ICD-10-CM

## 2013-01-24 DIAGNOSIS — Z9861 Coronary angioplasty status: Secondary | ICD-10-CM

## 2013-01-24 DIAGNOSIS — I129 Hypertensive chronic kidney disease with stage 1 through stage 4 chronic kidney disease, or unspecified chronic kidney disease: Secondary | ICD-10-CM | POA: Diagnosis present

## 2013-01-24 DIAGNOSIS — I509 Heart failure, unspecified: Secondary | ICD-10-CM | POA: Diagnosis present

## 2013-01-24 DIAGNOSIS — Z905 Acquired absence of kidney: Secondary | ICD-10-CM

## 2013-01-24 DIAGNOSIS — G4730 Sleep apnea, unspecified: Secondary | ICD-10-CM | POA: Diagnosis present

## 2013-01-24 DIAGNOSIS — E860 Dehydration: Secondary | ICD-10-CM | POA: Diagnosis present

## 2013-01-24 DIAGNOSIS — E785 Hyperlipidemia, unspecified: Secondary | ICD-10-CM

## 2013-01-24 DIAGNOSIS — R109 Unspecified abdominal pain: Secondary | ICD-10-CM | POA: Diagnosis present

## 2013-01-24 DIAGNOSIS — Z794 Long term (current) use of insulin: Secondary | ICD-10-CM

## 2013-01-24 DIAGNOSIS — Z550 Illiteracy and low-level literacy: Secondary | ICD-10-CM

## 2013-01-24 DIAGNOSIS — I2510 Atherosclerotic heart disease of native coronary artery without angina pectoris: Secondary | ICD-10-CM | POA: Diagnosis present

## 2013-01-24 DIAGNOSIS — J4489 Other specified chronic obstructive pulmonary disease: Secondary | ICD-10-CM | POA: Diagnosis present

## 2013-01-24 DIAGNOSIS — D649 Anemia, unspecified: Secondary | ICD-10-CM | POA: Diagnosis present

## 2013-01-24 DIAGNOSIS — N183 Chronic kidney disease, stage 3 unspecified: Secondary | ICD-10-CM | POA: Diagnosis present

## 2013-01-24 DIAGNOSIS — E669 Obesity, unspecified: Secondary | ICD-10-CM | POA: Diagnosis present

## 2013-01-24 DIAGNOSIS — R7309 Other abnormal glucose: Secondary | ICD-10-CM

## 2013-01-24 DIAGNOSIS — I4891 Unspecified atrial fibrillation: Secondary | ICD-10-CM | POA: Diagnosis present

## 2013-01-24 DIAGNOSIS — N179 Acute kidney failure, unspecified: Secondary | ICD-10-CM

## 2013-01-24 DIAGNOSIS — J449 Chronic obstructive pulmonary disease, unspecified: Secondary | ICD-10-CM | POA: Diagnosis present

## 2013-01-24 DIAGNOSIS — J441 Chronic obstructive pulmonary disease with (acute) exacerbation: Secondary | ICD-10-CM

## 2013-01-24 DIAGNOSIS — R739 Hyperglycemia, unspecified: Secondary | ICD-10-CM | POA: Diagnosis present

## 2013-01-24 DIAGNOSIS — E7800 Pure hypercholesterolemia, unspecified: Secondary | ICD-10-CM | POA: Diagnosis present

## 2013-01-24 DIAGNOSIS — R112 Nausea with vomiting, unspecified: Secondary | ICD-10-CM | POA: Diagnosis present

## 2013-01-24 LAB — CBC
HCT: 38 % — ABNORMAL LOW (ref 39.0–52.0)
Hemoglobin: 11.9 g/dL — ABNORMAL LOW (ref 13.0–17.0)
MCH: 21.1 pg — ABNORMAL LOW (ref 26.0–34.0)
MCHC: 31.3 g/dL (ref 30.0–36.0)
RBC: 5.64 MIL/uL (ref 4.22–5.81)

## 2013-01-24 LAB — BASIC METABOLIC PANEL
BUN: 20 mg/dL (ref 6–23)
CO2: 30 mEq/L (ref 19–32)
Calcium: 9.4 mg/dL (ref 8.4–10.5)
GFR calc non Af Amer: 40 mL/min — ABNORMAL LOW (ref >90)
Glucose, Bld: 488 mg/dL — ABNORMAL HIGH (ref 70–99)
Sodium: 132 mEq/L — ABNORMAL LOW (ref 135–145)

## 2013-01-24 LAB — GLUCOSE, CAPILLARY
Glucose-Capillary: 440 mg/dL — ABNORMAL HIGH (ref 70–99)
Glucose-Capillary: 357 mg/dL — ABNORMAL HIGH (ref 70–99)
Glucose-Capillary: 280 mg/dL — ABNORMAL HIGH (ref 70–99)

## 2013-01-24 LAB — URINALYSIS, ROUTINE W REFLEX MICROSCOPIC
Bilirubin Urine: NEGATIVE
Glucose, UA: >1000 mg/dL — AB
Protein, ur: 30 mg/dL — AB
Specific Gravity, Urine: 1.015 (ref 1.005–1.030)

## 2013-01-24 LAB — URINE MICROSCOPIC-ADD ON

## 2013-01-24 MED ORDER — ONDANSETRON HCL 4 MG/2ML IJ SOLN: 4.0000 mg | Freq: Three times a day (TID) | INTRAMUSCULAR | Status: AC | PRN

## 2013-01-24 MED ORDER — ONDANSETRON HCL 4 MG/2ML IJ SOLN
4.0000 mg | Freq: Once | INTRAMUSCULAR | Status: AC
  Administered 2013-01-24: 4 mg via INTRAVENOUS
  Filled 2013-01-24: qty 2

## 2013-01-24 MED ORDER — DEXTROSE 5 % IV SOLN
1.0000 g | INTRAVENOUS | Status: DC
  Administered 2013-01-24 – 2013-01-26 (×3): 1 g via INTRAVENOUS
  Filled 2013-01-24 (×4): qty 10

## 2013-01-24 MED ORDER — DEXTROSE 5 % IV SOLN
INTRAVENOUS | Status: AC
  Filled 2013-01-24: qty 10

## 2013-01-24 MED ORDER — AMIODARONE HCL 200 MG PO TABS
200.0000 mg | ORAL_TABLET | Freq: Every day | ORAL | Status: DC
  Administered 2013-01-24 – 2013-01-27 (×4): 200 mg via ORAL
  Filled 2013-01-24 (×4): qty 1

## 2013-01-24 MED ORDER — IOHEXOL 300 MG/ML  SOLN
50.0000 mL | Freq: Once | INTRAMUSCULAR | Status: AC | PRN
  Administered 2013-01-24: 50 mL via ORAL

## 2013-01-24 MED ORDER — PANTOPRAZOLE SODIUM 40 MG IV SOLR
40.0000 mg | INTRAVENOUS | Status: DC
  Administered 2013-01-24 – 2013-01-26 (×3): 40 mg via INTRAVENOUS
  Filled 2013-01-24 (×3): qty 40

## 2013-01-24 MED ORDER — ENOXAPARIN SODIUM 40 MG/0.4ML ~~LOC~~ SOLN
40.0000 mg | SUBCUTANEOUS | Status: DC
  Administered 2013-01-24 – 2013-01-26 (×3): 40 mg via SUBCUTANEOUS
  Filled 2013-01-24 (×3): qty 0.4

## 2013-01-24 MED ORDER — SODIUM CHLORIDE 0.9 % IV SOLN
INTRAVENOUS | Status: DC
  Filled 2013-01-24: qty 1

## 2013-01-24 MED ORDER — NITROGLYCERIN 0.4 MG SL SUBL: 0.4000 mg | SUBLINGUAL_TABLET | SUBLINGUAL | Status: DC | PRN

## 2013-01-24 MED ORDER — ONDANSETRON HCL 4 MG/2ML IJ SOLN: 4.0000 mg | Freq: Once | INTRAMUSCULAR | Status: DC

## 2013-01-24 MED ORDER — ONDANSETRON HCL 4 MG/2ML IJ SOLN
4.0000 mg | Freq: Four times a day (QID) | INTRAMUSCULAR | Status: DC | PRN
  Administered 2013-01-24: 4 mg via INTRAVENOUS
  Filled 2013-01-24: qty 2

## 2013-01-24 MED ORDER — ACETAMINOPHEN 650 MG RE SUPP: 650.0000 mg | Freq: Four times a day (QID) | RECTAL | Status: DC | PRN

## 2013-01-24 MED ORDER — ONDANSETRON HCL 4 MG PO TABS: 4.0000 mg | ORAL_TABLET | Freq: Four times a day (QID) | ORAL | Status: DC | PRN

## 2013-01-24 MED ORDER — INSULIN ASPART 100 UNIT/ML ~~LOC~~ SOLN
0.0000 [IU] | Freq: Three times a day (TID) | SUBCUTANEOUS | Status: DC
  Administered 2013-01-24: 20 [IU] via SUBCUTANEOUS
  Administered 2013-01-25: 4 [IU] via SUBCUTANEOUS
  Administered 2013-01-25: 11 [IU] via SUBCUTANEOUS
  Administered 2013-01-25 – 2013-01-26 (×2): 7 [IU] via SUBCUTANEOUS
  Administered 2013-01-26: 11 [IU] via SUBCUTANEOUS
  Administered 2013-01-26: 15 [IU] via SUBCUTANEOUS
  Administered 2013-01-27: 7 [IU] via SUBCUTANEOUS
  Administered 2013-01-27: 4 [IU] via SUBCUTANEOUS

## 2013-01-24 MED ORDER — DILTIAZEM HCL ER BEADS 240 MG PO CP24
360.0000 mg | ORAL_CAPSULE | Freq: Every day | ORAL | Status: DC
  Administered 2013-01-24: 360 mg via ORAL
  Filled 2013-01-24 (×3): qty 1

## 2013-01-24 MED ORDER — IPRATROPIUM BROMIDE 0.02 % IN SOLN: 0.5000 mg | Freq: Four times a day (QID) | RESPIRATORY_TRACT | Status: DC | PRN

## 2013-01-24 MED ORDER — INSULIN GLARGINE 100 UNIT/ML ~~LOC~~ SOLN
10.0000 [IU] | Freq: Every day | SUBCUTANEOUS | Status: DC
  Administered 2013-01-24 – 2013-01-26 (×3): 10 [IU] via SUBCUTANEOUS
  Filled 2013-01-24 (×4): qty 0.1

## 2013-01-24 MED ORDER — METOPROLOL TARTRATE 50 MG PO TABS
50.0000 mg | ORAL_TABLET | Freq: Two times a day (BID) | ORAL | Status: DC
  Administered 2013-01-24 – 2013-01-27 (×5): 50 mg via ORAL
  Filled 2013-01-24 (×6): qty 1

## 2013-01-24 MED ORDER — MORPHINE SULFATE 2 MG/ML IJ SOLN
1.0000 mg | INTRAMUSCULAR | Status: DC | PRN
  Administered 2013-01-26: 1 mg via INTRAVENOUS
  Filled 2013-01-24: qty 1

## 2013-01-24 MED ORDER — QUETIAPINE FUMARATE 25 MG PO TABS
50.0000 mg | ORAL_TABLET | Freq: Every day | ORAL | Status: DC
  Administered 2013-01-24 – 2013-01-26 (×3): 50 mg via ORAL
  Filled 2013-01-24 (×3): qty 2

## 2013-01-24 MED ORDER — ACETAMINOPHEN 325 MG PO TABS: 650.0000 mg | ORAL_TABLET | Freq: Four times a day (QID) | ORAL | Status: DC | PRN

## 2013-01-24 MED ORDER — SODIUM CHLORIDE 0.9 % IV BOLUS (SEPSIS)
1000.0000 mL | Freq: Once | INTRAVENOUS | Status: AC
  Administered 2013-01-24: 1000 mL via INTRAVENOUS

## 2013-01-24 MED ORDER — METHADONE HCL 10 MG PO TABS
5.0000 mg | ORAL_TABLET | Freq: Every day | ORAL | Status: DC | PRN
  Administered 2013-01-25 – 2013-01-26 (×2): 5 mg via ORAL
  Filled 2013-01-24 (×2): qty 1

## 2013-01-24 MED ORDER — IOHEXOL 300 MG/ML  SOLN: 100.0000 mL | Freq: Once | INTRAMUSCULAR | Status: AC | PRN

## 2013-01-24 MED ORDER — TAMSULOSIN HCL 0.4 MG PO CAPS
0.4000 mg | ORAL_CAPSULE | Freq: Every day | ORAL | Status: DC
  Administered 2013-01-24 – 2013-01-27 (×4): 0.4 mg via ORAL
  Filled 2013-01-24 (×4): qty 1

## 2013-01-24 MED ORDER — POTASSIUM CHLORIDE IN NACL 20-0.9 MEQ/L-% IV SOLN
INTRAVENOUS | Status: DC
  Administered 2013-01-24 – 2013-01-26 (×4): via INTRAVENOUS

## 2013-01-24 MED ORDER — INSULIN ASPART 100 UNIT/ML ~~LOC~~ SOLN
0.0000 [IU] | Freq: Every day | SUBCUTANEOUS | Status: DC
  Administered 2013-01-24: 3 [IU] via SUBCUTANEOUS
  Administered 2013-01-25 – 2013-01-26 (×2): 5 [IU] via SUBCUTANEOUS

## 2013-01-24 NOTE — H&P (Signed)
[facility] History and Physical  AMALIO LOE WUJ:[id number] DOB: June 23, 1940 DOA: 01/24/2013  Referring physician: Dr. Rhunette Croft, ER physician PCP: Isabella Stalling, MD  Specialists:   Chief Complaint: Vomiting and diarrhea  HPI: [name] is a 72 y.o. male with insulin-dependent diabetes who presents to the emergency room with hyperglycemia, vomiting, diarrhea dehydration. Patient reports onset of symptoms approximately 4-5 days ago. He reports that his symptoms began with dysgeusia. Patient reports having a bad taste in his mouth for several days. He was unable to eat due to this sensation. She reports that yesterday he began having episodes of nausea and vomiting and vomited several times. Currently he reports the vomitus to be bile in color. He also had several episodes of diarrhea last night. Denies any melena or hematochezia. He is unsure whether she's had a fever. He feels generally weak. Denies any cough or shortness of breath. No chest pain. He was evaluated in the emergency room and noted to be significantly hyperglycemic. He did not have an anion gap. He was also clinically dehydrated. He was unable to keep down any medications by mouth. The patient reports that he is also on methadone chronically for pain and may likely be suffering an element of narcotic withdrawal. Patient has been referred for admission.  Review of Systems: Pertinent positives as per history of present illness, otherwise negative  Past Medical History  Diagnosis Date  . Coronary artery disease   . Hypertension   . High cholesterol   . Diabetes mellitus   . LBBB (left bundle branch block)   . Paroxysmal atrial fibrillation   . Pneumonia 10-2012  . CHF (congestive heart failure)   . Renal disorder     kidney cancer  . Cancer 2010  . Hepatitis many yrs ago    no current liver problems per pt  . Disc     6 discs loswer back, back brace prn  . Anemia   . Sleep apnea     cpap setting of 4, or  uses oxygen 4 liters per Center   Past Surgical History  Procedure Laterality Date  . Nephrectomy Right 2010  . Esophagogastroduodenoscopy Left 10/02/2012    Procedure: ESOPHAGOGASTRODUODENOSCOPY (EGD);  Surgeon: Willis Modena, MD;  Location: [facility] ENDOSCOPY;  Service: Endoscopy;  Laterality: Left;  . Colonoscopy Left 10/03/2012    Procedure: COLONOSCOPY;  Surgeon: Shirley Friar, MD;  Location: [facility] ENDOSCOPY;  Service: Endoscopy;  Laterality: Left;  . Back surgery  1960's and 1970's    lower  . Coronary angioplasty with stent placement  yrs ago    x 5 or 6   Social History:  reports that he has been passively smoking.  He has never used smokeless tobacco. He reports that he does not drink alcohol or use illicit drugs.   No Known Allergies  Family History  Problem Relation Age of Onset  . Hyperlipidemia Mother   . Heart attack Father   . Diabetes Mother     Prior to Admission medications   Medication Sig Start Date End Date Taking? Authorizing Provider  amiodarone (PACERONE) 200 MG tablet Take 200 mg by mouth daily.   Yes Historical Provider, MD  diltiazem (TIAZAC) 360 MG 24 hr capsule Take 360 mg by mouth daily.   Yes Historical Provider, MD  docusate sodium (COLACE) 100 MG capsule Take 100 mg by mouth daily.   Yes Historical Provider, MD  furosemide (LASIX) 40 MG tablet Take 40 mg by mouth daily.   Yes  Historical Provider, MD  Insulin Glargine (LANTUS SOLOSTAR) 100 UNIT/ML SOPN Inject 10 Units into the skin at bedtime.   Yes Historical Provider, MD  ipratropium (ATROVENT) 0.02 % nebulizer solution Take 0.5 mg by nebulization every 6 (six) hours as needed. Shortness of breath 09/07/12  Yes Isabella Stalling, MD  methadone (DOLOPHINE) 5 MG tablet Take 5 mg by mouth daily as needed for pain.  09/07/12  Yes Isabella Stalling, MD  metoprolol (LOPRESSOR) 50 MG tablet Take 50 mg by mouth 2 (two) times daily.   Yes Historical Provider, MD  nitroGLYCERIN (NITROSTAT) 0.4 MG SL tablet Place  0.4 mg under the tongue every 5 (five) minutes as needed. Chest pain    Yes Historical Provider, MD  pantoprazole (PROTONIX) 40 MG tablet Take 40 mg by mouth daily.   Yes Historical Provider, MD  QUEtiapine (SEROQUEL) 50 MG tablet Take 1 tablet (50 mg total) by mouth at bedtime. 10/05/12  Yes Laveda Norman, MD  tamsulosin (FLOMAX) 0.4 MG CAPS capsule Take 0.4 mg by mouth daily.   Yes Historical Provider, MD   Physical Exam: Filed Vitals:   01/24/13 1550  BP: 157/87  Pulse: 85  Temp: 97.5 F (36.4 C)  Resp:      General:  No acute distress, doesn't appear acutely ill, has a wet rag on his forehead.  Eyes: Pupils are equal round react to light  ENT: Mucous membranes are dry  Neck: Supple  Cardiovascular: S1, S2, irregular  Respiratory: Clear to auscultation bilaterally  Abdomen: Soft, diffusely tender, positive bowel sounds  Skin: No rashes  Musculoskeletal: No pedal edema bilaterally  Psychiatric: Normal affect, cooperative with exam  Neurologic: Grossly intact, nonfocal  Labs on Admission:  Basic Metabolic Panel:  Recent Labs Lab 01/24/13 1121  NA 132*  K 3.9  CL 90*  CO2 30  GLUCOSE 488*  BUN 20  CREATININE 1.68*  CALCIUM 9.4   Liver Function Tests: No results found for this basename: AST, ALT, ALKPHOS, BILITOT, PROT, ALBUMIN,  in the last 168 hours No results found for this basename: LIPASE, AMYLASE,  in the last 168 hours No results found for this basename: AMMONIA,  in the last 168 hours CBC:  Recent Labs Lab 01/24/13 1121  WBC 4.6  HGB 11.9*  HCT 38.0*  MCV 67.4*  PLT 225   Cardiac Enzymes:  Recent Labs Lab 01/24/13 1121  TROPONINI <0.30    BNP (last 3 results)  Recent Labs  09/19/12 1300 09/20/12 0527 10/29/12 1353  PROBNP 681.2* 796.8* 584.2*   CBG:  Recent Labs Lab 01/24/13 1056 01/24/13 1347 01/24/13 1625  GLUCAP 448* 440* 357*    Radiological Exams on Admission: Dg Chest 2 View  01/24/2013   *RADIOLOGY REPORT*   Clinical Data: Chest pain.  CHEST - 2 VIEW  Comparison: 10/29/2012  Findings: Two views of the chest demonstrate mild enlargement of the heart which is unchanged.  Lungs are clear without focal airspace disease or frank pulmonary edema.  Trachea is midline. Bony thorax is intact.  IMPRESSION: No acute chest findings.   Original Report Authenticated By: Richarda Overlie, M.D.    EKG: Independently reviewed. LBBB (old)  Assessment/Plan Active Problems:   IDDM (insulin dependent diabetes mellitus)   COPD (chronic obstructive pulmonary disease)   Dehydration   Atrial fibrillation   CKD (chronic kidney disease) stage 3, GFR 30-59 ml/min   Nausea with vomiting   Hyperglycemia   Abdominal pain, unspecified site   1. Vomiting and diarrhea.  Possibly related to a gastroenteritis. Patient has significant abdominal pain. Will check a CT scan of the abdomen and pelvis to rule out any underlying pathology. Supportively with proton pump inhibitors, IV fluids. 2. Hyperglycemia with insulin-dependent diabetes. This appears to be improving with IV fluids and subcutaneous insulin. We'll continue Lantus for now. Start sliding scale insulin. 3. Chronic kidney disease stage III with acute renal insufficiency. Likely related to dehydration. We'll hydrate and recheck chemistries. 4. Dehydration. UA fluids 5. COPD. Appears to be stable no evidence of wheezing  Patient will be admitted to the service of Dr. Janna Arch. Hospitalists will be available for any issues until 7 AM on 8/18.  Code Status: full code Family Communication: discussed with patient, no family present Disposition Plan: discharge home once improved  Time spent:  MEMON,JEHANZEB [facility] Pager [phone]  If 7PM-7AM, please contact night-coverage [email or web address] Password [facility] 01/24/2013, 4:59 PM

## 2013-01-24 NOTE — ED Notes (Signed)
O2 applied at 2 L. O2 sat was dropping when pt would drift off to sleep. Pt wears O2 at home at night.

## 2013-01-24 NOTE — ED Notes (Signed)
Bolus initiated. Waiting for insulin drip to arrive from pharmacy.

## 2013-01-24 NOTE — Progress Notes (Signed)
Pt c/o of heart "feeling funny". EKG obtained and no change from initial EKG on admission.

## 2013-01-24 NOTE — Progress Notes (Signed)
01/24/13 1606 Patient c/o nausea, vomited small amount of brown emesis on arrival from emergency department. Stated nausea started "when I rode up here". Zofran 4 mg IV given in ER per report. Denies pain, states had about 3 episodes of diarrhea stools at home, last episode last night. None since coming to hospital. Notified Dr. Kerry Hough of patient arrival as ordered. Earnstine Regal, RN

## 2013-01-24 NOTE — ED Notes (Signed)
CBG of 468 on scene. Pt states home CBG's have been reading HIGH x 3 days. States intermittent vomiting x 3-4 days. Last vomited en route to ED. Also states diarrhea. Last episode of diarrhea was last night.

## 2013-01-24 NOTE — ED Provider Notes (Signed)
CSN: [id number]     Arrival date & time 01/24/13  1047 History    This chart was scribed for Derwood Kaplan, MD,  by Ashley Jacobs, ED Scribe. The patient was seen in room APA01/APA01 and the patient's care was started at 12:15 PM.   First MD Initiated Contact with Patient 01/24/13 1204     Chief Complaint  Patient presents with  . Hyperglycemia   (Consider location/radiation/quality/duration/timing/severity/associated sxs/prior Treatment) The history is provided by medical records and the patient. No language interpreter was used.   HPI Comments: MYNOR WITKOP is a 72 y.o. male who presents via EMS to the Emergency Department complaining of hyperglycemia that presented 5 day prior attributed due to not eating. Pt report chills, dizziness, lightheadedness. Pt also mentions the associated symptoms of frequency and diarrhea (last episode was last night).  He report that the reading on his  Pt has a hx of heart disease and diabetes. He mentions that his blood sugar levels were elevated for the past 3 days and mentions intermittently vomiting 3-4 time. En route to the ED pt vomited. Pt's PCP is Dr. Janna Arch  Past Medical History  Diagnosis Date  . Coronary artery disease   . Hypertension   . High cholesterol   . Diabetes mellitus   . LBBB (left bundle branch block)   . Paroxysmal atrial fibrillation   . Pneumonia 10-2012  . CHF (congestive heart failure)   . Renal disorder     kidney cancer  . Cancer 2010  . Hepatitis many yrs ago    no current liver problems per pt  . Disc     6 discs loswer back, back brace prn  . Anemia   . Sleep apnea     cpap setting of 4, or uses oxygen 4 liters per [place]   Past Surgical History  Procedure Laterality Date  . Nephrectomy Right 2010  . Esophagogastroduodenoscopy Left 10/02/2012    Procedure: ESOPHAGOGASTRODUODENOSCOPY (EGD);  Surgeon: Willis Modena, MD;  Location: [facility] ENDOSCOPY;  Service: Endoscopy;  Laterality: Left;  . Colonoscopy Left  10/03/2012    Procedure: COLONOSCOPY;  Surgeon: Shirley Friar, MD;  Location: [facility] ENDOSCOPY;  Service: Endoscopy;  Laterality: Left;  . Back surgery  1960's and 1970's    lower  . Coronary angioplasty with stent placement  yrs ago    x 5 or 6   Family History  Problem Relation Age of Onset  . Hyperlipidemia Mother   . Heart attack Father   . Diabetes Mother    History  Substance Use Topics  . Smoking status: Passive Smoke Exposure - Never Smoker  . Smokeless tobacco: Never Used  . Alcohol Use: No    Review of Systems  Constitutional: Positive for fever, chills and appetite change.  Eyes: Negative for visual disturbance.  Respiratory: Negative for cough and shortness of breath.   Gastrointestinal: Positive for nausea, vomiting and diarrhea. Negative for constipation and anal bleeding.  Endocrine: Positive for polydipsia.  Genitourinary: Negative for dysuria, hematuria and difficulty urinating.  Musculoskeletal: Negative for back pain.  Neurological: Positive for dizziness, weakness and light-headedness.    Allergies  Review of patient's allergies indicates no known allergies.  Home Medications   Current Outpatient Rx  Name  Route  Sig  Dispense  Refill  . amiodarone (PACERONE) 200 MG tablet   Oral   Take 200 mg by mouth daily.         Marland Kitchen diltiazem (TIAZAC) 360 MG 24 hr capsule  Oral   Take 360 mg by mouth daily.         Marland Kitchen docusate sodium (COLACE) 100 MG capsule   Oral   Take 100 mg by mouth daily.         . furosemide (LASIX) 40 MG tablet   Oral   Take 40 mg by mouth daily.         . methadone (DOLOPHINE) 5 MG tablet   Oral   Take 5 mg by mouth daily as needed for pain.          . metoprolol (LOPRESSOR) 50 MG tablet   Oral   Take 50 mg by mouth 2 (two) times daily.         . nitroGLYCERIN (NITROSTAT) 0.4 MG SL tablet   Sublingual   Place 0.4 mg under the tongue every 5 (five) minutes as needed. Chest pain          . pantoprazole  (PROTONIX) 40 MG tablet   Oral   Take 40 mg by mouth daily.         . QUEtiapine (SEROQUEL) 50 MG tablet   Oral   Take 1 tablet (50 mg total) by mouth at bedtime.   30 tablet   0   . tamsulosin (FLOMAX) 0.4 MG CAPS capsule   Oral   Take 0.4 mg by mouth daily.         . Insulin Glargine (LANTUS SOLOSTAR) 100 UNIT/ML SOPN   Subcutaneous   Inject 40 Units into the skin at bedtime.   15 mL   6    BP 145/71  Pulse 64  Temp(Src) 98 F (36.7 C) (Oral)  Resp 19  Ht 5\' 7"  (1.702 m)  Wt 230 lb (104.327 kg)  BMI 36.01 kg/m2  SpO2 91% . Physical Exam  Nursing note and vitals reviewed. Constitutional: He is oriented to person, place, and time. He appears well-developed and well-nourished. No distress.  HENT:  Head: Normocephalic and atraumatic.  Mouth/Throat: Oropharynx is clear and moist.  Mucus membranes are dry   Eyes: Conjunctivae are normal. Pupils are equal, round, and reactive to light. No scleral icterus.  Neck: Neck supple.  Cardiovascular: Normal rate, regular rhythm, normal heart sounds and intact distal pulses.   No murmur heard. Pulmonary/Chest: Effort normal and breath sounds normal. No stridor. No respiratory distress. He has no wheezes. He has no rales.  Abdominal: Soft. Bowel sounds are normal. He exhibits no distension. There is tenderness. There is no rebound and no guarding.  Diffused tenderness on abd  Musculoskeletal: Normal range of motion. He exhibits no edema.  Neurological: He is alert and oriented to person, place, and time. No cranial nerve deficit. Coordination normal.  Skin: Skin is warm and dry. No rash noted.  Psychiatric: He has a normal mood and affect. His behavior is normal.    ED Course  DIAGNOSTIC STUDIES: Oxygen Saturation is 9% on [place], low by my interpretation.    COORDINATION OF CARE: 12:18 PM Discussed course of care with pt . Pt understands and agrees.  Procedures (including critical care time)  Labs Reviewed  GLUCOSE,  CAPILLARY - Abnormal; Notable for the following:    Glucose-Capillary 448 (*)    All other components within normal limits  CBC - Abnormal; Notable for the following:    Hemoglobin 11.9 (*)    HCT 38.0 (*)    MCV 67.4 (*)    MCH 21.1 (*)    RDW 17.0 (*)  All other components within normal limits  BASIC METABOLIC PANEL - Abnormal; Notable for the following:    Sodium 132 (*)    Chloride 90 (*)    Glucose, Bld 488 (*)    Creatinine, Ser 1.68 (*)    GFR calc non Af Amer 40 (*)    GFR calc Af Amer 46 (*)    All other components within normal limits  URINALYSIS, ROUTINE W REFLEX MICROSCOPIC - Abnormal; Notable for the following:    Glucose, UA >1000 (*)    Hgb urine dipstick SMALL (*)    Ketones, ur 15 (*)    Protein, ur 30 (*)    All other components within normal limits  URINE MICROSCOPIC-ADD ON - Abnormal; Notable for the following:    Bacteria, UA FEW (*)    All other components within normal limits  GLUCOSE, CAPILLARY - Abnormal; Notable for the following:    Glucose-Capillary 440 (*)    All other components within normal limits  GLUCOSE, CAPILLARY - Abnormal; Notable for the following:    Glucose-Capillary 357 (*)    All other components within normal limits  COMPREHENSIVE METABOLIC PANEL - Abnormal; Notable for the following:    Glucose, Bld 305 (*)    Creatinine, Ser 1.39 (*)    Albumin 3.0 (*)    Total Bilirubin 0.2 (*)    GFR calc non Af Amer 50 (*)    GFR calc Af Amer 58 (*)    All other components within normal limits  CBC - Abnormal; Notable for the following:    Hemoglobin 11.2 (*)    HCT 36.2 (*)    MCV 68.6 (*)    MCH 21.2 (*)    RDW 17.3 (*)    All other components within normal limits  HEMOGLOBIN A1C - Abnormal; Notable for the following:    Hemoglobin A1C 12.7 (*)    Mean Plasma Glucose 318 (*)    All other components within normal limits  GLUCOSE, CAPILLARY - Abnormal; Notable for the following:    Glucose-Capillary 280 (*)    All other  components within normal limits  GLUCOSE, CAPILLARY - Abnormal; Notable for the following:    Glucose-Capillary 282 (*)    All other components within normal limits  GLUCOSE, CAPILLARY - Abnormal; Notable for the following:    Glucose-Capillary 203 (*)    All other components within normal limits  GLUCOSE, CAPILLARY - Abnormal; Notable for the following:    Glucose-Capillary 164 (*)    All other components within normal limits  BASIC METABOLIC PANEL - Abnormal; Notable for the following:    Glucose, Bld 345 (*)    Creatinine, Ser 1.39 (*)    Calcium 8.1 (*)    GFR calc non Af Amer 50 (*)    GFR calc Af Amer 58 (*)    All other components within normal limits  GLUCOSE, CAPILLARY - Abnormal; Notable for the following:    Glucose-Capillary 384 (*)    All other components within normal limits  GLUCOSE, CAPILLARY - Abnormal; Notable for the following:    Glucose-Capillary 301 (*)    All other components within normal limits  GLUCOSE, CAPILLARY - Abnormal; Notable for the following:    Glucose-Capillary 259 (*)    All other components within normal limits  GLUCOSE, CAPILLARY - Abnormal; Notable for the following:    Glucose-Capillary 213 (*)    All other components within normal limits  BASIC METABOLIC PANEL - Abnormal; Notable for the following:  Glucose, Bld 238 (*)    Creatinine, Ser 1.57 (*)    GFR calc non Af Amer 43 (*)    GFR calc Af Amer 50 (*)    All other components within normal limits  GLUCOSE, CAPILLARY - Abnormal; Notable for the following:    Glucose-Capillary 376 (*)    All other components within normal limits  GLUCOSE, CAPILLARY - Abnormal; Notable for the following:    Glucose-Capillary 180 (*)    All other components within normal limits  GLUCOSE, CAPILLARY - Abnormal; Notable for the following:    Glucose-Capillary 216 (*)    All other components within normal limits  URINE CULTURE  CLOSTRIDIUM DIFFICILE BY PCR  STOOL CULTURE  OVA AND PARASITE  EXAMINATION  TROPONIN I     No results found. 1. Hyperglycemia without ketosis   2. Dehydration   3. AKI (acute kidney injury)   4. Abdominal pain, unspecified site   5. CKD (chronic kidney disease) stage 3, GFR 30-59 ml/min     MDM  I personally performed the services described in this documentation, which was scribed in my presence. The recorded information has been reviewed and is accurate.  Pt comes in with cc of elevated blood sugar. PMHx of atrial fibrillation with RVR on amio, COPD, diabetes. Pt has been feeling weak for the past few days, and he has some nausea, emesis now. He appears dry on exam. He is on methadone and narcotic pain meds, unable to tolerate - which might be contributing in form of emesis in form of methadone withdrawal.  Pt's sugar is elevated. We will ensure there is no DKA. We will hydrate him in the ED. Try to get his pain, nausea in control. Infection workup in form of CXR and UA ordered.     Derwood Kaplan, MD 01/31/13 2130

## 2013-01-25 ENCOUNTER — Encounter (HOSPITAL_COMMUNITY): Payer: Self-pay | Admitting: Gastroenterology

## 2013-01-25 DIAGNOSIS — K5289 Other specified noninfective gastroenteritis and colitis: Secondary | ICD-10-CM

## 2013-01-25 LAB — COMPREHENSIVE METABOLIC PANEL
Albumin: 3 g/dL — ABNORMAL LOW (ref 3.5–5.2)
BUN: 13 mg/dL (ref 6–23)
Calcium: 8.5 mg/dL (ref 8.4–10.5)
GFR calc Af Amer: 58 mL/min — ABNORMAL LOW (ref >90)
Glucose, Bld: 305 mg/dL — ABNORMAL HIGH (ref 70–99)
Total Protein: 6.5 g/dL (ref 6.0–8.3)

## 2013-01-25 LAB — CBC
HCT: 36.2 % — ABNORMAL LOW (ref 39.0–52.0)
Hemoglobin: 11.2 g/dL — ABNORMAL LOW (ref 13.0–17.0)
RBC: 5.28 MIL/uL (ref 4.22–5.81)
RDW: 17.3 % — ABNORMAL HIGH (ref 11.5–15.5)
WBC: 5.1 10*3/uL (ref 4.0–10.5)

## 2013-01-25 LAB — GLUCOSE, CAPILLARY
Glucose-Capillary: 282 mg/dL — ABNORMAL HIGH (ref 70–99)
Glucose-Capillary: 164 mg/dL — ABNORMAL HIGH (ref 70–99)
Glucose-Capillary: 384 mg/dL — ABNORMAL HIGH (ref 70–99)

## 2013-01-25 LAB — URINE CULTURE
Colony Count: NO GROWTH
Culture: NO GROWTH

## 2013-01-25 LAB — HEMOGLOBIN A1C: Mean Plasma Glucose: 318 mg/dL — ABNORMAL HIGH (ref <117)

## 2013-01-25 MED ORDER — DILTIAZEM HCL ER COATED BEADS 180 MG PO CP24
360.0000 mg | ORAL_CAPSULE | Freq: Every day | ORAL | Status: DC
  Administered 2013-01-25 – 2013-01-27 (×3): 360 mg via ORAL
  Filled 2013-01-25 (×3): qty 2

## 2013-01-25 NOTE — Consult Note (Signed)
Referring Provider: Dr. Janna Arch Primary Care Physician:  Isabella Stalling, MD Primary Gastroenterologist:  Dr. Dulce Sellar  Date of Admission: 01/24/13 Date of Consultation:  01/25/13  Reason for Consultation:  Colitis, terminal ileitis  HPI:  72 year old male admitted with hyperglycemia, abdominal pain, and dehydration secondary to vomiting, diarrhea. CT revealed terminal ileitis and diffuse colitis without fistula or abscess. Was inpatient April 2014 at [facility] with anemia and melena in the setting of anticoagulation, undergoing an upper endoscopy and colonoscopy by [facility]. EGD by Dr. Dulce Sellar April 2014 normal. Colonoscopy by Dr. Bosie Clos with poorly prepped colon, lipomatous ICV. Path revealed focal acute inflammation, question of NSAID injury.   Appears he is scheduled for repeat colonoscopy with Propofol by Dr. Dulce Sellar Aug 20th.   States vomiting and diarrhea started Saturday night. Last episode of diarrhea Saturday night. Last episode of vomiting Saturday. Lasted a few hours. States blood sugar was over 550 over the weekend. Denies any abdominal pain during acute episode or any currently. No frank rectal bleeding or melena. Tolerated clear liquid diet this morning. Last BM yesterday, soft. Denies any recent antibiotic exposure other than admission in April 2014. No sick contacts. Denies NSAIDs. Occasional alka setzer. Chronic GERD, PPI daily. Denies dysphagia or other upper GI symptoms.   Past Medical History  Diagnosis Date  . Coronary artery disease   . Hypertension   . High cholesterol   . Diabetes mellitus   . LBBB (left bundle branch block)   . Paroxysmal atrial fibrillation   . Pneumonia 10-2012  . CHF (congestive heart failure)   . Renal disorder     kidney cancer  . Cancer 2010  . Hepatitis many yrs ago    no current liver problems per pt. reportedly Hepatitis C  . Disc     6 discs loswer back, back brace prn  . Anemia   . Sleep apnea     cpap setting of 4,  or uses oxygen 4 liters per [place]    Past Surgical History  Procedure Laterality Date  . Nephrectomy Right 2010  . Esophagogastroduodenoscopy Left 10/02/2012    Dr. Dulce Sellar: normal   . Colonoscopy Left 10/03/2012    Dr. Bosie Clos: poorly prepped colon, lipomatous ileocecal valve. Path revealed focal acute inflammation with question of NSAID-induced injury.   . Back surgery  1960's and 1970's    lower  . Coronary angioplasty with stent placement  yrs ago    x 5 or 6  . Left ring finger traumatic amputation      jumped out of truck, finger caught    Prior to Admission medications   Medication Sig Start Date End Date Taking? Authorizing Provider  amiodarone (PACERONE) 200 MG tablet Take 200 mg by mouth daily.   Yes Historical Provider, MD  diltiazem (TIAZAC) 360 MG 24 hr capsule Take 360 mg by mouth daily.   Yes Historical Provider, MD  docusate sodium (COLACE) 100 MG capsule Take 100 mg by mouth daily.   Yes Historical Provider, MD  furosemide (LASIX) 40 MG tablet Take 40 mg by mouth daily.   Yes Historical Provider, MD  Insulin Glargine (LANTUS SOLOSTAR) 100 UNIT/ML SOPN Inject 10 Units into the skin at bedtime.   Yes Historical Provider, MD  ipratropium (ATROVENT) 0.02 % nebulizer solution Take 0.5 mg by nebulization every 6 (six) hours as needed. Shortness of breath 09/07/12  Yes Isabella Stalling, MD  methadone (DOLOPHINE) 5 MG tablet Take 5 mg by mouth daily as needed for pain.  09/07/12  Yes Isabella Stalling, MD  metoprolol (LOPRESSOR) 50 MG tablet Take 50 mg by mouth 2 (two) times daily.   Yes Historical Provider, MD  nitroGLYCERIN (NITROSTAT) 0.4 MG SL tablet Place 0.4 mg under the tongue every 5 (five) minutes as needed. Chest pain    Yes Historical Provider, MD  pantoprazole (PROTONIX) 40 MG tablet Take 40 mg by mouth daily.   Yes Historical Provider, MD  QUEtiapine (SEROQUEL) 50 MG tablet Take 1 tablet (50 mg total) by mouth at bedtime. 10/05/12  Yes Laveda Norman, MD  tamsulosin  (FLOMAX) 0.4 MG CAPS capsule Take 0.4 mg by mouth daily.   Yes Historical Provider, MD    Current Facility-Administered Medications  Medication Dose Route Frequency Provider Last Rate Last Dose  . 0.9 % NaCl with KCl 20 mEq/ L  infusion   Intravenous Continuous Erick Blinks, MD 100 mL/hr at 01/25/13 1610    . acetaminophen (TYLENOL) tablet 650 mg  650 mg Oral Q6H PRN Erick Blinks, MD       Or  . acetaminophen (TYLENOL) suppository 650 mg  650 mg Rectal Q6H PRN Erick Blinks, MD      . amiodarone (PACERONE) tablet 200 mg  200 mg Oral Daily Erick Blinks, MD   200 mg at 01/24/13 1953  . cefTRIAXone (ROCEPHIN) 1 g in dextrose 5 % 50 mL IVPB  1 g Intravenous Q24H Erick Blinks, MD   1 g at 01/24/13 1828  . diltiazem (CARDIZEM CD) 24 hr capsule 360 mg  360 mg Oral Daily Isabella Stalling, MD      . enoxaparin (LOVENOX) injection 40 mg  40 mg Subcutaneous Q24H Erick Blinks, MD   40 mg at 01/24/13 1730  . insulin aspart (novoLOG) injection 0-20 Units  0-20 Units Subcutaneous TID WC Erick Blinks, MD   11 Units at 01/25/13 0814  . insulin aspart (novoLOG) injection 0-5 Units  0-5 Units Subcutaneous QHS Erick Blinks, MD   3 Units at 01/24/13 2144  . insulin glargine (LANTUS) injection 10 Units  10 Units Subcutaneous Daily Derwood Kaplan, MD   10 Units at 01/24/13 1454  . ipratropium (ATROVENT) nebulizer solution 0.5 mg  0.5 mg Nebulization Q6H PRN Erick Blinks, MD      . methadone (DOLOPHINE) tablet 5 mg  5 mg Oral Daily PRN Erick Blinks, MD      . metoprolol (LOPRESSOR) tablet 50 mg  50 mg Oral BID Erick Blinks, MD   50 mg at 01/24/13 2128  . morphine 2 MG/ML injection 1 mg  1 mg Intravenous Q4H PRN Erick Blinks, MD      . nitroGLYCERIN (NITROSTAT) SL tablet 0.4 mg  0.4 mg Sublingual Q5 min PRN Erick Blinks, MD      . ondansetron (ZOFRAN) tablet 4 mg  4 mg Oral Q6H PRN Erick Blinks, MD       Or  . ondansetron (ZOFRAN) injection 4 mg  4 mg Intravenous Q6H PRN Erick Blinks, MD    4 mg at 01/24/13 1730  . pantoprazole (PROTONIX) injection 40 mg  40 mg Intravenous Q24H Erick Blinks, MD   40 mg at 01/24/13 1728  . QUEtiapine (SEROQUEL) tablet 50 mg  50 mg Oral QHS Erick Blinks, MD   50 mg at 01/24/13 2128  . tamsulosin (FLOMAX) capsule 0.4 mg  0.4 mg Oral Daily Erick Blinks, MD   0.4 mg at 01/24/13 1953    Allergies as of 01/24/2013  . (No Known Allergies)  Family History  Problem Relation Age of Onset  . Hyperlipidemia Mother   . Heart attack Father   . Diabetes Mother   . Colon cancer Neg Hx   . Crohn's disease Neg Hx   . Ulcerative colitis Neg Hx     History   Social History  . Marital Status: Legally Separated    Spouse Name: N/A    Number of Children: N/A  . Years of Education: N/A   Occupational History  . Not on file.   Social History Main Topics  . Smoking status: Passive Smoke Exposure - Never Smoker  . Smokeless tobacco: Never Used  . Alcohol Use: No     Comment: quit ETOH 25-30 years ago  . Drug Use: No  . Sexual Activity: Not Currently   Other Topics Concern  . Not on file   Social History Narrative  . No narrative on file    Review of Systems: Gen: see HPI CV: occasional palpitations Resp: +DOE GI: see HPI GU : Denies urinary burning, urinary frequency, urinary incontinence.  MS: Denies joint pain,swelling, cramping Derm: Denies rash, itching, dry skin Psych: Denies depression, anxiety,confusion, or memory loss Heme: Denies bruising, bleeding, and enlarged lymph nodes.  Physical Exam: Vital signs in last 24 hours: Temp:  [97.5 F (36.4 C)-99.1 F (37.3 C)] 98.3 F (36.8 C) (08/18 0508) Pulse Rate:  [60-85] 60 (08/18 0508) Resp:  [16-20] 18 (08/18 0508) BP: (125-157)/(54-87) 125/71 mmHg (08/18 0508) SpO2:  [91 %-100 %] 99 % (08/18 0805) FiO2 (%):  [2 %] 2 % (08/17 1550) Weight:  [223 lb (101.152 kg)-230 lb (104.327 kg)] 223 lb (101.152 kg) (08/17 1550) Last BM Date: 01/24/13 General:   Alert,   Well-developed, well-nourished, pleasant and cooperative in NAD Head:  Normocephalic and atraumatic. Eyes:  Sclera clear, no icterus.   Conjunctiva pink. Ears:  Slightly hard of hearing Nose:  No deformity, discharge,  or lesions. Mouth:  Poor dentition Neck:  Supple; no masses or thyromegaly. Lungs:  Clear throughout to auscultation.   No wheezes, crackles, or rhonchi. No acute distress. Heart:  S1 S2 present, no murmurs appreciated.  Abdomen:  Soft, obese, mild TTP RLQ but no rebound or guarding. Normal bowel sounds. Likely small umbilical hernia noted. Unable to appreciate HSM due to large body habitus.  Rectal:  Deferred until time of colonoscopy.   Msk:  Symmetrical without gross deformities. Normal posture. Extremities:  Trace pedal edema Neurologic:  Alert and  oriented x4;  grossly normal neurologically. Skin:  Intact without significant lesions or rashes. Cervical Nodes:  No significant cervical adenopathy. Psych:  Alert and cooperative. Normal mood and affect.  Intake/Output from previous day: 08/17 0701 - 08/18 0700 In: 1666.7 [P.O.:360; I.V.:1256.7; IV Piggyback:50] Out: 300 [Urine:300] Intake/Output this shift:    Lab Results:  Recent Labs  01/24/13 1121 01/25/13 0603  WBC 4.6 5.1  HGB 11.9* 11.2*  HCT 38.0* 36.2*  PLT 225 243   BMET  Recent Labs  01/24/13 1121 01/25/13 0603  NA 132* 138  K 3.9 3.6  CL 90* 101  CO2 30 30  GLUCOSE 488* 305*  BUN 20 13  CREATININE 1.68* 1.39*  CALCIUM 9.4 8.5   LFT  Recent Labs  01/25/13 0603  PROT 6.5  ALBUMIN 3.0*  AST 11  ALT 11  ALKPHOS 82  BILITOT 0.2*    Studies/Results: Ct Abdomen Pelvis Wo Contrast  01/24/2013   *RADIOLOGY REPORT*  Clinical Data: Nausea, vomiting, abdominal pain  CT ABDOMEN  AND PELVIS WITHOUT CONTRAST  Technique:  Multidetector CT imaging of the abdomen and pelvis was performed following the standard protocol without intravenous contrast. Oral contrast was administered.  Comparison:  March 09, 2012  Findings: There is a stable 1 cm nodular opacity in the posterior segment of the left lower lobe.  Lung bases are otherwise clear.  There is fatty change in the liver.  No focal liver lesions are identified on this non intravenous contrast enhanced study.  There is no biliary duct dilatation.  There is a gallstone in the gallbladder measuring 9 mm.  There is no gallbladder wall thickening or pericholecystic fluid apparent.  Spleen, pancreas, and adrenals appear normal.  Right kidney is surgically absent. No lesion is seen in the right pararenal fossa region.  There is a 1 cm cyst arising from the lateral mid left kidney.  There is no hydronephrosis or calculus in the left kidney. There is no ureteral calculus or ureterectasis on the left.  In the pelvis, there is no mass or fluid collection.  There is no periappendiceal region inflammation. Appendix appears normal.  There is thickening of the wall of the terminal ileum without fistula there is also wall thickening throughout the cecum, ascending colon, transverse colon, and proximal descending colon. There is no abscess.  No bowel obstruction.  No free air or portal venous air.  There is no ascites, adenopathy, or abscess in the abdomen or pelvis.  There is atherosclerotic change in the aorta but no aneurysm.  There are no blastic or lytic bone lesions.  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  There is spinal stenosis at the L2-3, L4-5, and L5 - S1 due to disc protrusion as well as extensive bony hypertrophy.  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  These findings could have infectious etiology but also could indicate Crohn disease.  No fistulae seen.  No abscess.  Cholelithiasis.  Fatty liver.  Stable 1.0 cm nodular opacity posterior segment left lower lobe. This finding has been stable since prior study.  A lesion of this nature should be documented as stable for a minimum of 2 years to confirm benign  etiology.  Given this finding, a follow-up CT in 4-6 months would be advisable.  Given history of previous neoplasia, it may be reasonable to obtain complete chest CT to further assess as well.  Right kidney surgically absent.   Original Report Authenticated By: Bretta Bang, M.D.   Dg Chest 2 View  01/24/2013   *RADIOLOGY REPORT*  Clinical Data: Chest pain.  CHEST - 2 VIEW  Comparison: 10/29/2012  Findings: Two views of the chest demonstrate mild enlargement of the heart which is unchanged.  Lungs are clear without focal airspace disease or frank pulmonary edema.  Trachea is midline. Bony thorax is intact.  IMPRESSION: No acute chest findings.   Original Report Authenticated By: Richarda Overlie, M.D.    Impression: 72 year old male found to have diffuse colitis and terminal ileitis as evidenced by CT scan; however, he denies any abdominal pain and experienced only short course of diarrhea on Saturday. Clinically, he has improved since admission. Last colonoscopy April 2014 by Dr. Bosie Clos, and he is due for repeat colonoscopy with Dr. Dulce Sellar due to poor prep. This was actually scheduled for Aug 20th. Denies antibiotic exposure recently. Unable to rule out superimposed infectious process, possible underlying evolving IBD. Will order stool studies and hold off on empiric antibiotics currently, as he has improved significantly since admission.  Recommend postponing colonoscopy until 4-6 weeks from now. Will need outpatient follow-up with Dr. Dulce Sellar to arrange this.   Plan: Stool studies to include Cdiff, Stool culture, Giardia Continue PPI Tight glycemic control Outpatient follow-up with Dr. Dulce Sellar for colonoscopy. Would postpone at least 4-6 weeks.  Consider empiric abx if necessary; for now, hold off, as he is clinically improving.  Hopeful discharge soon  Nira Retort, ANP-BC [facility]  10:24 AM     LOS: 1 day    01/25/2013

## 2013-01-25 NOTE — Care Management Note (Signed)
    Page 1 of 1   01/27/2013     2:44:38 PM   CARE MANAGEMENT NOTE 01/27/2013  Patient:  [name], [name]   Account Number:  [id number]  Date Initiated:  01/25/2013  Documentation initiated by:  Rosemary Holms  Subjective/Objective Assessment:   Pt admitted from home where he lives alone. States his daughter help him when needed. States he has previously had [facility] and if needs are identified he would choose them again     Action/Plan:   Anticipated DC Date:  01/27/2013   Anticipated DC Plan:  HOME W HOME HEALTH SERVICES      DC Planning Services  CM consult      Choice offered to / List presented to:          [facility] arranged  HH-1 RN  HH-10 DISEASE MANAGEMENT      HH agency  [facility]   Status of service:  Completed, signed off Medicare Important Message given?  YES (If response is "NO", the following Medicare IM given date fields will be blank) Date Medicare IM given:  01/26/2013 Date Additional Medicare IM given:    Discharge Disposition:    Per UR Regulation:    If discussed at Long Length of Stay Meetings, dates discussed:    Comments:  01/26/13 Rosemary Holms RN BSN CM Pt set up with [facility] for [facility] RN. Anticipate DC tomorrow  01/25/13 Rosemary Holms RN BSN CM

## 2013-01-25 NOTE — Progress Notes (Signed)
[name], [name]                ACCOUNT NO.:  [id number]  MEDICAL RECORD NO.:  [id number]  LOCATION:  A326                          FACILITY:  [facility]  PHYSICIAN:  Melvyn Novas, MDDATE OF BIRTH:  [birth year]  DATE OF PROCEDURE: DATE OF DISCHARGE:                                PROGRESS NOTE   The patient was admitted yesterday with some nausea, vomiting, dyspepsia, found to be significantly hyperglycemic.  The patient is totally illiterate.  He is insulin-dependent diabetic, hypertensive. Had paroxysmal AFib, currently in sinus rhythm.  Hypertension, hyperlipidemia, as well as BPH.  CT scan reveals diffuse thickening of the cecum, ascending, transverse, and proximal descending colon consistent with colitis.  The patient states he had attempted a colonoscopy approximately a year ago; however, the prep was inadequate, and they want him to repeat this, but he did not want to stay for this in the hospital.  I have not seen him in the office in at least 6 months.  Told the patient, I believe we need to look into his thickened colon and possible colitis for various etiologies, he agreed.  Blood pressure 125/71, temperature 98.3, pulse 60 and regular, respiratory rate is 18.  WBCs of 5, hemoglobin 11.2, creatinine 1.39 this morning. Lungs, clear to A and P.  No rales, wheeze, or rhonchi. Heart; regular, rhythm.  No S3, S4.  No heaves, thrills, or rubs. Abdomen is soft, nontender.  No right upper quadrant tenderness.  No guarding or rebound.  No masses.  No organomegaly.  IMPRESSION: 1. Nausea, vomiting, dyspepsia, now seemed to be resolved. 2. Insulin-dependent diabetes.  The patient was discharged on 50 units     of Lantus, currently taking 10.  This may explain hyperglycemia. 3. Hypertension. 4. Atrial fibrillation, currently in sinus rhythm, all well     controlled.  PLAN:  Right now is to obtain GI consultation to see if the colonoscopy is indicated and whether the  patient will remain in hospital for this. We will continue clear liquids at present.     Melvyn Novas, MD     RMD/MEDQ  D:  01/25/2013  T:  01/25/2013  Job:  [id number]

## 2013-01-25 NOTE — Consult Note (Addendum)
REVIEWED. AGREE. TCS AS INPT IF PT DOES NOT CLINICALLY IMPROVE. PT DESIRES TO FOLLOW WITH GI IN [place].

## 2013-01-25 NOTE — Progress Notes (Signed)
[id number] 

## 2013-01-25 NOTE — Progress Notes (Signed)
UR Chart Review Completed  

## 2013-01-25 NOTE — Progress Notes (Signed)
Inpatient Diabetes Program Recommendations  AACE/ADA: New Consensus Statement on Inpatient Glycemic Control (2013)  Target Ranges:  Prepandial:   less than 140 mg/dL      Peak postprandial:   less than 180 mg/dL (1-2 hours)      Critically ill patients:  140 - 180 mg/dL  Results for ISADORE, PALECEK (MRN [id number]) as of 01/25/2013 09:25  Ref. Range 01/24/2013 11:21  Hemoglobin A1C Latest Range: <5.7 % 12.7 (H)   Results for DHAVAL, WOO (MRN [id number]) as of 01/25/2013 09:25  Ref. Range 01/24/2013 10:56 01/24/2013 13:47 01/24/2013 16:25 01/24/2013 20:26 01/25/2013 07:18  Glucose-Capillary Latest Range: 70-99 mg/dL 962 (H) 952 (H) 841 (H) 280 (H) 282 (H)    Inpatient Diabetes Program Recommendations Insulin - Basal: Please consider increasing basal insulin to Lantus 20 units daily.  Note: Patient has a history of diabetes and takes Lantus 10 units QHS as an outpatient for diabetes management.  Currently, patient is ordered to receive Lantus 10 units daily, Novolog 0-20 units AC, and Novolog 0-5 units HS for inpatient glycemic control.  Patient was admitted as an inpatient in April 2014 and on home medication list prior to that admission, patient was taking Lantus 40 units QHS.  However, during hospital admission, blood glucose was controlled on diet and Novolog correction resistant correction scale and patient was discharged on Lantus 10 units QHS.  Patient was to follow up with PCP for further adjustments to basal insulin.  A1C noted to be 12.7% on 01/24/13 indicating very poor control of diabetes.  Blood glucose over the past 24 hours has ranged from 280-448 mg/dl and fasting blood glucose this morning was 282 mg/dl.  Please consider increasing Lantus to 20 units daily.  Will continue to follow.  Thanks, Orlando Penner, RN, MSN, CCRN Diabetes Coordinator Inpatient Diabetes Program [phone]

## 2013-01-26 ENCOUNTER — Telehealth: Payer: Self-pay | Admitting: Gastroenterology

## 2013-01-26 LAB — GLUCOSE, CAPILLARY
Glucose-Capillary: 213 mg/dL — ABNORMAL HIGH (ref 70–99)
Glucose-Capillary: 376 mg/dL — ABNORMAL HIGH (ref 70–99)

## 2013-01-26 LAB — BASIC METABOLIC PANEL
BUN: 10 mg/dL (ref 6–23)
CO2: 29 mEq/L (ref 19–32)
Calcium: 8.1 mg/dL — ABNORMAL LOW (ref 8.4–10.5)
Creatinine, Ser: 1.39 mg/dL — ABNORMAL HIGH (ref 0.50–1.35)
GFR calc Af Amer: 58 mL/min — ABNORMAL LOW (ref >90)

## 2013-01-26 MED ORDER — INSULIN GLARGINE 100 UNIT/ML ~~LOC~~ SOLN
40.0000 [IU] | Freq: Every day | SUBCUTANEOUS | Status: DC
  Administered 2013-01-27: 40 [IU] via SUBCUTANEOUS
  Filled 2013-01-26 (×2): qty 0.4

## 2013-01-26 NOTE — Progress Notes (Signed)
Inpatient Diabetes Program Recommendations  AACE/ADA: New Consensus Statement on Inpatient Glycemic Control (2013)  Target Ranges:  Prepandial:   less than 140 mg/dL      Peak postprandial:   less than 180 mg/dL (1-2 hours)      Critically ill patients:  140 - 180 mg/dL   Results for BRENSON, HARTMAN (MRN [id number]) as of 01/26/2013 10:34  Ref. Range 01/25/2013 07:18 01/25/2013 11:54 01/25/2013 16:49 01/25/2013 20:39  Glucose-Capillary Latest Range: 70-99 mg/dL 409 (H) 811 (H) 914 (H) 384 (H)   Results for ANSHUL, MEDDINGS (MRN [id number]) as of 01/26/2013 10:34  Ref. Range 01/25/2013 06:03 01/26/2013 06:01  Glucose Latest Range: 70-99 mg/dL 086 (H) 578 (H)   Inpatient Diabetes Program Recommendations Insulin - Basal: Please consider increasing basal insulin to Lantus 20 units daily.  Note: Fasting blood glucose over the past 2 days are greater than 300 mg/dl and blood glucose ranged from 164-384 mg/dl on 4/69/62.  Please consider increasing Lantus to 20 units daily.  Will continue to follow as an inpatient.    Thanks, Orlando Penner, RN, MSN, CCRN Diabetes Coordinator Inpatient Diabetes Program [phone]

## 2013-01-26 NOTE — Progress Notes (Signed)
Subjective: Denies nausea, vomiting, abdominal pain or loose stool. No rectal bleeding. Tolerating diet. Last BM 8/17.   Objective: Vital signs in last 24 hours: Temp:  [97.4 F (36.3 C)-98.2 F (36.8 C)] 97.4 F (36.3 C) (08/19 0533) Pulse Rate:  [59-62] 59 (08/19 0533) Resp:  [18] 18 (08/19 0533) BP: (106-123)/(55-63) 115/58 mmHg (08/19 0533) SpO2:  [99 %] 99 % (08/19 0533) Last BM Date: 01/24/13 General:   Alert and oriented, pleasant Head:  Normocephalic and atraumatic. Eyes:  No icterus, sclera clear. Conjuctiva pink.  Heart:  S1, S2 present, no murmurs noted.  Abdomen:  Bowel sounds present, soft, obese, small umbilical hernia, without rebound or guarding. Difficult to appreciate HSM due to large body habitus.  Extremities:  Trace edema bilateral lower extremities   Intake/Output from previous day: 08/18 0701 - 08/19 0700 In: 960 [P.O.:960] Out: 1400 [Urine:1400] Intake/Output this shift:    Lab Results:  Recent Labs  01/24/13 1121 01/25/13 0603  WBC 4.6 5.1  HGB 11.9* 11.2*  HCT 38.0* 36.2*  PLT 225 243   BMET  Recent Labs  01/24/13 1121 01/25/13 0603 01/26/13 0601  NA 132* 138 136  K 3.9 3.6 4.4  CL 90* 101 103  CO2 30 30 29   GLUCOSE 488* 305* 345*  BUN 20 13 10   CREATININE 1.68* 1.39* 1.39*  CALCIUM 9.4 8.5 8.1*   LFT  Recent Labs  01/25/13 0603  PROT 6.5  ALBUMIN 3.0*  AST 11  ALT 11  ALKPHOS 82  BILITOT 0.2*     Studies/Results: Ct Abdomen Pelvis Wo Contrast  01/24/2013   *RADIOLOGY REPORT*  Clinical Data: Nausea, vomiting, abdominal pain  CT ABDOMEN AND PELVIS WITHOUT CONTRAST  Technique:  Multidetector CT imaging of the abdomen and pelvis was performed following the standard protocol without intravenous contrast. Oral contrast was administered.  Comparison: March 09, 2012  Findings: There is a stable 1 cm nodular opacity in the posterior segment of the left lower lobe.  Lung bases are otherwise clear.  There is fatty change in  the liver.  No focal liver lesions are identified on this non intravenous contrast enhanced study.  There is no biliary duct dilatation.  There is a gallstone in the gallbladder measuring 9 mm.  There is no gallbladder wall thickening or pericholecystic fluid apparent.  Spleen, pancreas, and adrenals appear normal.  Right kidney is surgically absent. No lesion is seen in the right pararenal fossa region.  There is a 1 cm cyst arising from the lateral mid left kidney.  There is no hydronephrosis or calculus in the left kidney. There is no ureteral calculus or ureterectasis on the left.  In the pelvis, there is no mass or fluid collection.  There is no periappendiceal region inflammation. Appendix appears normal.  There is thickening of the wall of the terminal ileum without fistula there is also wall thickening throughout the cecum, ascending colon, transverse colon, and proximal descending colon. There is no abscess.  No bowel obstruction.  No free air or portal venous air.  There is no ascites, adenopathy, or abscess in the abdomen or pelvis.  There is atherosclerotic change in the aorta but no aneurysm.  There are no blastic or lytic bone lesions.  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  There is spinal stenosis at the L2-3, L4-5, and L5 - S1 due to disc protrusion as well as extensive bony hypertrophy.  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  These  findings could have infectious etiology but also could indicate Crohn disease.  No fistulae seen.  No abscess.  Cholelithiasis.  Fatty liver.  Stable 1.0 cm nodular opacity posterior segment left lower lobe. This finding has been stable since prior study.  A lesion of this nature should be documented as stable for a minimum of 2 years to confirm benign etiology.  Given this finding, a follow-up CT in 4-6 months would be advisable.  Given history of previous neoplasia, it may be reasonable to obtain complete chest CT to  further assess as well.  Right kidney surgically absent.   Original Report Authenticated By: Bretta Bang, M.D.   Dg Chest 2 View  01/24/2013   *RADIOLOGY REPORT*  Clinical Data: Chest pain.  CHEST - 2 VIEW  Comparison: 10/29/2012  Findings: Two views of the chest demonstrate mild enlargement of the heart which is unchanged.  Lungs are clear without focal airspace disease or frank pulmonary edema.  Trachea is midline. Bony thorax is intact.  IMPRESSION: No acute chest findings.   Original Report Authenticated By: Richarda Overlie, M.D.    Assessment: 72 year old male admitted with diffuse colitis and terminal ileitis on CT scan, with resolution of diarrhea since admission. Last colonoscopy April 2014 by Dr. Bosie Clos with poor prep. Due to clinical improvement, antibiotics have been held but stool studies ordered. No further loose stools since admission; therefore, no specimens collected. Clinically, patient has improved significantly from admission and is appropriate for discharge from a GI standpoint.  Ultimately needs outpatient colonoscopy with Dr. Darrick Penna in 4-6 weeks to assess for evolving IBD. If recurrent loose stools, obtain stool studies and consider empiric antibiotics.   As of note, patient originally scheduled to follow-up with Dr. Dulce Sellar for TCS; he desires to stay with our practice in [place].   Plan: Advance to low-residue diet. Tight glycemic control Outpatient follow-up in our office in 4 weeks to schedule colonoscopy with Dr. Darrick Penna.  Appropriate for discharge from a GI standpoint. If recurrent loose stools after discharge, obtain stool studies  Nira Retort, ANP-BC [facility]    LOS: 2 days    01/26/2013, 7:41 AM

## 2013-01-26 NOTE — Progress Notes (Signed)
[id number] 

## 2013-01-26 NOTE — Telephone Encounter (Signed)
Done Procedure has been canceled

## 2013-01-26 NOTE — Telephone Encounter (Signed)
Please contact [facility] and inform that [name] is inpatient at [facility]. He will not be able to proceed with the outpatient colonoscopy scheduled with Dr. Dulce Sellar on 8/19.   He desires to stay in [place] for GI follow-up, which is more convenient due to transportation issues.

## 2013-01-27 ENCOUNTER — Encounter (HOSPITAL_COMMUNITY): Admission: RE | Payer: Self-pay | Source: Ambulatory Visit

## 2013-01-27 ENCOUNTER — Ambulatory Visit (HOSPITAL_COMMUNITY): Admission: RE | Admit: 2013-01-27 | Payer: Medicare Other | Source: Ambulatory Visit | Admitting: Gastroenterology

## 2013-01-27 HISTORY — DX: Anemia, unspecified: D64.9

## 2013-01-27 HISTORY — DX: Reserved for inherently not codable concepts without codable children: IMO0001

## 2013-01-27 LAB — BASIC METABOLIC PANEL
CO2: 27 mEq/L (ref 19–32)
Chloride: 104 mEq/L (ref 96–112)
Creatinine, Ser: 1.57 mg/dL — ABNORMAL HIGH (ref 0.50–1.35)
Potassium: 4.8 mEq/L (ref 3.5–5.1)

## 2013-01-27 LAB — GLUCOSE, CAPILLARY
Glucose-Capillary: 180 mg/dL — ABNORMAL HIGH (ref 70–99)
Glucose-Capillary: 216 mg/dL — ABNORMAL HIGH (ref 70–99)

## 2013-01-27 SURGERY — COLONOSCOPY WITH PROPOFOL
Anesthesia: Monitor Anesthesia Care

## 2013-01-27 MED ORDER — INSULIN GLARGINE 100 UNIT/ML SOLOSTAR PEN: 40.0000 [IU] | PEN_INJECTOR | Freq: Every day | SUBCUTANEOUS | Status: DC

## 2013-01-27 NOTE — Progress Notes (Signed)
Inpatient Diabetes Program Recommendations  AACE/ADA: New Consensus Statement on Inpatient Glycemic Control (2013)  Target Ranges:  Prepandial:   less than 140 mg/dL      Peak postprandial:   less than 180 mg/dL (1-2 hours)      Critically ill patients:  140 - 180 mg/dL   Results for EMET, RAFANAN (MRN [id number]) as of 01/27/2013 09:51  Ref. Range 01/26/2013 07:31 01/26/2013 11:21 01/26/2013 16:06 01/26/2013 20:59 01/27/2013 07:19  Glucose-Capillary Latest Range: 70-99 mg/dL 409 (H) 811 (H) 914 (H) 376 (H) 180 (H)    Note: Noted that an order for Lantus 40 units daily was ordered on 8/19 and is to start with first dose on 8/20 @ 10:00.  Blood glucose ranged from 213-376 mg/dl on 7/82/95 and fasting blood glucose this morning was 180 mg/dl (these blood glucose values are with Lantus 10 units daily and Novolog resistant correction scale ACHS).  Called patient to verify home dose of Lantus.  Patient states that his home dose of Lantus was 10 units QHS and the day before yesterday (day of admission) he was instructed to increase to Lantus 50 units daily.  Noted in progress note by Dr. Janna Arch that patient was last seen about 6 months ago and he was suppose to be taking Lantus 50 units QHS as an outpatient.  Will continue to follow as an inpatient.  Thanks, Orlando Penner, RN, MSN, CCRN Diabetes Coordinator Inpatient Diabetes Program [phone]

## 2013-01-27 NOTE — Discharge Summary (Signed)
[id number] 

## 2013-01-27 NOTE — Discharge Summary (Signed)
[name], [name]                ACCOUNT NO.:  [id number]  MEDICAL RECORD NO.:  [id number]  LOCATION:  A326                          FACILITY:  [facility]  PHYSICIAN:  Melvyn Novas, MDDATE OF BIRTH:  Oct 15, 1940  DATE OF ADMISSION:  01/24/2013 DATE OF DISCHARGE:  08/20/2014LH                              DISCHARGE SUMMARY   [name] is a 72 year old, illiterate white male who has hypertension, hyperlipidemia, history of paroxysmal AFib, although currently in sinus rhythm, history of noncompliance and insulin- dependent diabetes.  The patient had been admitted with nausea, vomiting, and dyspepsia.  The symptoms seemed to resolve within a 24-48 hour period.  CT scan of his abdomen revealed nothing revealing except diffuse thickening of the ascending transverse and proximal descending colon as well as cecum consistent with colitis, etiology undetermined. He is scheduled for an outpatient colonoscopy with Dr. Mardee Postin on January 27, 2013.  He was seen in consultation by GI during this admission, and felt that he should discontinue with outpatient colonoscopy.  He was hemodynamically stable throughout his hospital stay.  He was admitted with hyperglycemia.  He had only been taking 10 of his 50 units of Lantus at night.  This was rectified in hospital.  DISCHARGE MEDICATIONS:  He was subsequently discharged on the following medicines: 1. Insulin 40 units subcu of Lantus at bedtime. 2. Amiodarone 200 mg p.o. daily. 3. Diltiazem 360 p.o. daily. 4. Colace 100 mg p.o. daily. 5. Lasix 40 mg p.o. daily. 6. Methadone 5 mg p.o. t.i.d. 7. Lopressor 50 mg p.o. b.i.d. 8. Nitroglycerin 0.4 mg sublingual p.r.n. 9. Protonix 40 mg p.o. daily. 10.Seroquel 50 mg p.o. at bedtime. 11.Flomax 0.4 mg p.o. daily.  He will follow up in the office in 4-5 days' time to assess GI symptoms and to ensure followup with his colonoscopy.     Melvyn Novas, MD    RMD/MEDQ  D:  01/27/2013   T:  01/27/2013  Job:  [id number]

## 2013-01-27 NOTE — Progress Notes (Signed)
NAMEBURLON, CENTRELLA                ACCOUNT NO.:  [id number]  MEDICAL RECORD NO.:  [id number]  LOCATION:  A326                          FACILITY:  [facility]  PHYSICIAN:  Melvyn Novas, MDDATE OF BIRTH:  1941/05/01  DATE OF PROCEDURE: DATE OF DISCHARGE:                                PROGRESS NOTE   SUBJECTIVE:  The patient's upper GI symptoms nausea and vomiting which has resolved in the previous 48 hours.  He had a CT scan of his abdomen showing diffuse thickening of the ascending, transverse and proximal descending colon consistent with colitis.  However, the patient has not any symptoms referable to this.  He had an incomplete colonoscopy due to poor prep and is scheduled for a repeat colonoscopy with Dr. Lupita Leash, gastroenterologist on January 27, 2013.  Seen in consultation by GI. They feel he should keep his outpatient appointment.  OBJECTIVE:  VITAL SIGNS:  Blood pressure 115/57, pulse 58 and regular, temperature 97.8, O2 saturation 99%. LUNGS: No rales, wheeze, or rhonchi. HEART:  Regular rhythm.  No murmurs, gallops, or rubs. ABDOMEN: Soft, nontender.  Bowel sounds normoactive.  The plan right now is resume regular diet.  Increase his Lantus insulin to 40 units at bedtime which is less than his typical of 50 for better glycemic control.  We will monitor glycemic control in the next 24 hours and hopefully discharge within the same period.     Melvyn Novas, MD     RMD/MEDQ  D:  01/26/2013  T:  01/27/2013  Job:  [id number]

## 2013-01-27 NOTE — Progress Notes (Signed)
Pt's IV removed.  Site WNL.  AVS reviewed with patient.  Patient verbalized understanding to d/c instructions.  Patient educated on medication changes, medications remaining for today and physician follow-up.  Patient verbalized understanding.  Teach back method used.  Patient reports all belongings intact and in possession at discharge.  Pt transported by NT via w/c to main entrance for discharge.  [facility] to provide home health care to patient.  Patient reports he has home O2 for night time use.  Patient stable at time of discharge.

## 2013-08-12 ENCOUNTER — Telehealth: Payer: Self-pay | Admitting: Interventional Cardiology

## 2013-08-12 NOTE — Telephone Encounter (Signed)
Called pt and let him know that we have received form. He told that he has had wheezing/SOB over the last two weeks. He states he gives out of breath if he tries to do anything. Pt has some lightheadedness of times. Pt denies LE edema. Pt also has soreness on the left side of his neck down his arm for about 2 weeks.

## 2013-08-12 NOTE — Telephone Encounter (Signed)
New message          Pt sent dmv papers through fax on Monday 08/09/13. Did you receive them?

## 2013-08-13 NOTE — Telephone Encounter (Signed)
Spoke with pt and appt made for 08/16/13 to evaluate.

## 2013-08-13 NOTE — Telephone Encounter (Signed)
Pt overdue for follow up. I will check with Dr. Irish Lack and see how soon we need to see pt. Pt has a h/o afib.

## 2013-08-16 ENCOUNTER — Ambulatory Visit (INDEPENDENT_AMBULATORY_CARE_PROVIDER_SITE_OTHER): Payer: Medicare Other | Admitting: Interventional Cardiology

## 2013-08-16 ENCOUNTER — Encounter: Payer: Self-pay | Admitting: Cardiology

## 2013-08-16 VITALS — BP 128/62 | HR 57 | Ht 67.0 in | Wt 240.0 lb

## 2013-08-16 DIAGNOSIS — R0602 Shortness of breath: Secondary | ICD-10-CM

## 2013-08-16 DIAGNOSIS — E1159 Type 2 diabetes mellitus with other circulatory complications: Secondary | ICD-10-CM

## 2013-08-16 DIAGNOSIS — I2510 Atherosclerotic heart disease of native coronary artery without angina pectoris: Secondary | ICD-10-CM

## 2013-08-16 DIAGNOSIS — I4891 Unspecified atrial fibrillation: Secondary | ICD-10-CM

## 2013-08-16 DIAGNOSIS — E1169 Type 2 diabetes mellitus with other specified complication: Secondary | ICD-10-CM

## 2013-08-16 DIAGNOSIS — I10 Essential (primary) hypertension: Secondary | ICD-10-CM

## 2013-08-16 LAB — BRAIN NATRIURETIC PEPTIDE: PRO B NATRI PEPTIDE: 82 pg/mL (ref 0.0–100.0)

## 2013-08-16 LAB — BASIC METABOLIC PANEL
BUN: 19 mg/dL (ref 6–23)
CO2: 34 mEq/L — ABNORMAL HIGH (ref 19–32)
Calcium: 9 mg/dL (ref 8.4–10.5)
Chloride: 98 mEq/L (ref 96–112)
Creatinine, Ser: 1.7 mg/dL — ABNORMAL HIGH (ref 0.4–1.5)
GFR: 43.41 mL/min — ABNORMAL LOW (ref >60.00)
GLUCOSE: 139 mg/dL — AB (ref 70–99)
POTASSIUM: 4.4 meq/L (ref 3.5–5.1)
Sodium: 140 mEq/L (ref 135–145)

## 2013-08-16 MED ORDER — NITROGLYCERIN 0.2 MG/HR TD PT24: 0.2000 mg | MEDICATED_PATCH | Freq: Every day | TRANSDERMAL | Status: DC

## 2013-08-16 MED ORDER — NITROGLYCERIN 0.4 MG SL SUBL: 0.4000 mg | SUBLINGUAL_TABLET | SUBLINGUAL | Status: DC | PRN

## 2013-08-16 NOTE — Progress Notes (Signed)
Patient ID: [name], male   DOB: 1940/10/27, 73 y.o.   MRN: [id number]    [address] [place], [place]  [address] Phone: [phone] Fax:  [phone]  Date:  08/16/2013   ID:  Bobby, Joseph 05/15/1941, MRN [id number]  PCP:  Maricela Curet, MD      History of Present Illness: [name] is a 73 y.o. male who has had CAD. He has been exercising regularly and his stamina improves. He uses the elliptical. He has lost some weight since his last visit.  No problems with driving. CAD/ASCVD:  Denies : Chest pain.  Dizziness.   Leg edema.  Nitroglycerin.  Orthopnea.  Palpitations.  Syncope.   Occasional DOE, with long walks in the store.  He needs a form to allow a drivers license.   Wt Readings from Last 3 Encounters:  08/16/13 240 lb (108.863 kg)  01/24/13 223 lb (101.152 kg)  10/29/12 202 lb (91.627 kg)     Past Medical History  Diagnosis Date  . Coronary artery disease   . Hypertension   . High cholesterol   . Diabetes mellitus   . LBBB (left bundle branch block)   . Paroxysmal atrial fibrillation   . Pneumonia 10-2012  . CHF (congestive heart failure)   . Renal disorder     kidney cancer  . Cancer 2010  . Hepatitis many yrs ago    no current liver problems per pt. reportedly Hepatitis C  . Disc     6 discs loswer back, back brace prn  . Anemia   . Sleep apnea     cpap setting of 4, or uses oxygen 4 liters per [place]    Current Outpatient Prescriptions  Medication Sig Dispense Refill  . acetaminophen (TYLENOL) 650 MG CR tablet Take by mouth every 8 (eight) hours as needed for pain.      Marland Kitchen amiodarone (PACERONE) 200 MG tablet Take 200 mg by mouth daily.      . cetirizine (ZYRTEC) 10 MG tablet Take 10 mg by mouth daily.      . COMBIVENT RESPIMAT 20-100 MCG/ACT AERS respimat       . diltiazem (TIAZAC) 360 MG 24 hr capsule Take 360 mg by mouth daily.      Marland Kitchen docusate sodium (COLACE) 100 MG capsule Take 100 mg by mouth daily.      .  furosemide (LASIX) 40 MG tablet Take 40 mg by mouth daily.      Marland Kitchen HYDROcodone-acetaminophen (NORCO) 10-325 MG per tablet       . Insulin Glargine (LANTUS SOLOSTAR) 100 UNIT/ML SOPN Inject 40 Units into the skin at bedtime.  15 mL  6  . methadone (DOLOPHINE) 5 MG tablet Take 5 mg by mouth daily as needed for pain.       . metoprolol (LOPRESSOR) 50 MG tablet Take 50 mg by mouth daily.       . nitroGLYCERIN (NITRODUR - DOSED IN MG/24 HR) 0.2 mg/hr patch Place 0.2 mg onto the skin daily.      . nitroGLYCERIN (NITROSTAT) 0.4 MG SL tablet Place 0.4 mg under the tongue every 5 (five) minutes as needed. Chest pain       . pantoprazole (PROTONIX) 40 MG tablet Take 40 mg by mouth daily.      . QUEtiapine (SEROQUEL) 50 MG tablet Take 1 tablet (50 mg total) by mouth at bedtime.  30 tablet  0  . tamsulosin (FLOMAX) 0.4  MG CAPS capsule Take 0.4 mg by mouth daily.       No current facility-administered medications for this visit.    Allergies:   No Known Allergies  Social History:  The patient  reports that he has been passively smoking.  He has never used smokeless tobacco. He reports that he does not drink alcohol or use illicit drugs.   Family History:  The patient's family history includes Diabetes in his mother; Heart attack in his father; Hyperlipidemia in his mother. There is no history of Colon cancer, Crohn's disease, or Ulcerative colitis.   ROS:  Please see the history of present illness.  No nausea, vomiting.  No fevers, chills.  No focal weakness.  No dysuria.   All other systems reviewed and negative.   PHYSICAL EXAM: VS:  BP 128/62  Pulse 57  Ht 5\' 7"  (1.702 m)  Wt 240 lb (108.863 kg)  BMI 37.58 kg/m2  SpO2 98% Well nourished, well developed, in no acute distress HEENT: normal Neck: no JVD, no carotid bruits Cardiac:  normal S1, S2; RRR;  Lungs:  clear to auscultation bilaterally, no wheezing, rhonchi or rales Abd: soft, nontender, no hepatomegaly Ext: no edema Skin: warm and  dry Neuro:   no focal abnormalities noted  EKG:  Sinus bradycardia, LBBB  ASSESSMENT AND PLAN:  Coronary atherosclerosis of native coronary artery  LAB: CBC without Diff Notes: Angina resolved.  Refill NTG pills and patches.  2. Atrial fibrillation  Decrease Amiodarone HCl Tablet, 200 MG, 1 tablets, Orally, Once a day Notes: Maintaining normal rhythm. COntinue cardizem. Will hold off on coumadin at this point due to his anemia. No blood in stool. He is off of his plavix. Check CBC. If hbg remains stable in the future, would consider restarting plavix.  3. Hypertension, essential  Notes: BP controlled. COntinue current meds.  4. Obesity, unspecified  Notes: He lost 20 lbs in the hospital last year.  He has gained weight since the last visit. He needs to exercise more and watch his diet. I encouraged him to decrease carbohyrate intake. Avoid bread. Oatmeal preferable in the morning.   5.  SHOB: Will check BNP, BMet.  Continue diuretics. OK for regular drivers license.  He has been driving without any problems.       Signed, Mina Marble, MD, [facility] 08/16/2013 10:15 AM

## 2013-08-16 NOTE — Patient Instructions (Signed)
Your physician recommends that you return for lab work today for Bmet and BNP.  Your physician recommends that you continue on your current medications as directed. Please refer to the Current Medication list given to you today.  Your physician wants you to follow-up in: 1 year with Dr. Irish Lack. You will receive a reminder letter in the mail two months in advance. If you don't receive a letter, please call our office to schedule the follow-up appointment.

## 2013-08-18 ENCOUNTER — Encounter: Payer: Self-pay | Admitting: Interventional Cardiology

## 2013-09-13 ENCOUNTER — Other Ambulatory Visit: Payer: Self-pay | Admitting: Interventional Cardiology

## 2013-09-15 ENCOUNTER — Encounter (HOSPITAL_COMMUNITY): Payer: Self-pay | Admitting: Emergency Medicine

## 2013-09-15 DIAGNOSIS — Z8719 Personal history of other diseases of the digestive system: Secondary | ICD-10-CM | POA: Insufficient documentation

## 2013-09-15 DIAGNOSIS — I2510 Atherosclerotic heart disease of native coronary artery without angina pectoris: Secondary | ICD-10-CM | POA: Insufficient documentation

## 2013-09-15 DIAGNOSIS — I10 Essential (primary) hypertension: Secondary | ICD-10-CM | POA: Insufficient documentation

## 2013-09-15 DIAGNOSIS — Z9861 Coronary angioplasty status: Secondary | ICD-10-CM | POA: Insufficient documentation

## 2013-09-15 DIAGNOSIS — C7951 Secondary malignant neoplasm of bone: Secondary | ICD-10-CM | POA: Insufficient documentation

## 2013-09-15 DIAGNOSIS — M542 Cervicalgia: Secondary | ICD-10-CM | POA: Insufficient documentation

## 2013-09-15 DIAGNOSIS — Z794 Long term (current) use of insulin: Secondary | ICD-10-CM | POA: Insufficient documentation

## 2013-09-15 DIAGNOSIS — E119 Type 2 diabetes mellitus without complications: Secondary | ICD-10-CM | POA: Insufficient documentation

## 2013-09-15 DIAGNOSIS — Z85528 Personal history of other malignant neoplasm of kidney: Secondary | ICD-10-CM | POA: Insufficient documentation

## 2013-09-15 DIAGNOSIS — I509 Heart failure, unspecified: Secondary | ICD-10-CM | POA: Insufficient documentation

## 2013-09-15 DIAGNOSIS — Z859 Personal history of malignant neoplasm, unspecified: Secondary | ICD-10-CM | POA: Insufficient documentation

## 2013-09-15 DIAGNOSIS — I4891 Unspecified atrial fibrillation: Secondary | ICD-10-CM | POA: Insufficient documentation

## 2013-09-15 DIAGNOSIS — Z8701 Personal history of pneumonia (recurrent): Secondary | ICD-10-CM | POA: Insufficient documentation

## 2013-09-15 DIAGNOSIS — Z862 Personal history of diseases of the blood and blood-forming organs and certain disorders involving the immune mechanism: Secondary | ICD-10-CM | POA: Insufficient documentation

## 2013-09-15 DIAGNOSIS — Z905 Acquired absence of kidney: Secondary | ICD-10-CM | POA: Insufficient documentation

## 2013-09-15 DIAGNOSIS — I447 Left bundle-branch block, unspecified: Secondary | ICD-10-CM | POA: Insufficient documentation

## 2013-09-15 DIAGNOSIS — G4730 Sleep apnea, unspecified: Secondary | ICD-10-CM | POA: Insufficient documentation

## 2013-09-15 DIAGNOSIS — Z9981 Dependence on supplemental oxygen: Secondary | ICD-10-CM | POA: Insufficient documentation

## 2013-09-15 DIAGNOSIS — C7952 Secondary malignant neoplasm of bone marrow: Secondary | ICD-10-CM

## 2013-09-15 DIAGNOSIS — Z79899 Other long term (current) drug therapy: Secondary | ICD-10-CM | POA: Insufficient documentation

## 2013-09-15 DIAGNOSIS — Z9889 Other specified postprocedural states: Secondary | ICD-10-CM | POA: Insufficient documentation

## 2013-09-15 NOTE — ED Notes (Signed)
Patient c/o left sided neck pain x 1 month; states he thought it would go away, but pain is still there and getting worse.

## 2013-09-16 ENCOUNTER — Emergency Department (HOSPITAL_COMMUNITY)
Admission: EM | Admit: 2013-09-16 | Discharge: 2013-09-16 | Disposition: A | Discharge status: Home / Self Care | Payer: Medicare Other | Attending: Emergency Medicine | Admitting: Emergency Medicine

## 2013-09-16 ENCOUNTER — Emergency Department (HOSPITAL_COMMUNITY): Payer: Medicare Other

## 2013-09-16 ENCOUNTER — Encounter (HOSPITAL_COMMUNITY): Payer: Self-pay | Admitting: Radiology

## 2013-09-16 DIAGNOSIS — C801 Malignant (primary) neoplasm, unspecified: Secondary | ICD-10-CM

## 2013-09-16 DIAGNOSIS — M542 Cervicalgia: Secondary | ICD-10-CM

## 2013-09-16 DIAGNOSIS — C7951 Secondary malignant neoplasm of bone: Secondary | ICD-10-CM

## 2013-09-16 MED ORDER — IBUPROFEN 800 MG PO TABS
800.0000 mg | ORAL_TABLET | Freq: Once | ORAL | Status: AC
  Administered 2013-09-16: 800 mg via ORAL
  Filled 2013-09-16: qty 1

## 2013-09-16 MED ORDER — NAPROXEN 375 MG PO TABS: 375.0000 mg | ORAL_TABLET | Freq: Two times a day (BID) | ORAL | Status: DC

## 2013-09-16 NOTE — Discharge Instructions (Signed)
Neck pain is from the destruction of one of the bones in your neck. This is probably from cancer, and possibly from your previous kidney cancer. He need to make an appointment with the neurosurgeon and also with your PCP. Wear the collar until then. Continue taking methadone as needed for pain. Return to the emergency department if you develop any weakness.  Naproxen and naproxen sodium oral immediate-release tablets What is this medicine? NAPROXEN (na PROX en) is a non-steroidal anti-inflammatory drug (NSAID). It is used to reduce swelling and to treat pain. This medicine may be used for dental pain, headache, or painful monthly periods. It is also used for painful joint and muscular problems such as arthritis, tendinitis, bursitis, and gout. This medicine may be used for other purposes; ask your health care provider or pharmacist if you have questions. COMMON BRAND NAME(S): Aflaxen, Aleve Arthritis, Aleve, All Day Relief, Anaprox DS, Anaprox, Naprosyn What should I tell my health care provider before I take this medicine? They need to know if you have any of these conditions: -asthma -cigarette smoker -drink more than 3 alcohol containing drinks a day -heart disease or circulation problems such as heart failure or leg edema (fluid retention) -high blood pressure -kidney disease -liver disease -stomach bleeding or ulcers -an unusual or allergic reaction to naproxen, aspirin, other NSAIDs, other medicines, foods, dyes, or preservatives -pregnant or trying to get pregnant -breast-feeding How should I use this medicine? Take this medicine by mouth with a glass of water. Follow the directions on the prescription label. Take it with food if your stomach gets upset. Try to not lie down for at least 10 minutes after you take it. Take your medicine at regular intervals. Do not take your medicine more often than directed. Long-term, continuous use may increase the risk of heart attack or stroke. A  special MedGuide will be given to you by the pharmacist with each prescription and refill. Be sure to read this information carefully each time. Talk to your pediatrician regarding the use of this medicine in children. Special care may be needed. Overdosage: If you think you have taken too much of this medicine contact a poison control center or emergency room at once. NOTE: This medicine is only for you. Do not share this medicine with others. What if I miss a dose? If you miss a dose, take it as soon as you can. If it is almost time for your next dose, take only that dose. Do not take double or extra doses. What may interact with this medicine? -alcohol -aspirin -cidofovir -diuretics -lithium -methotrexate -other drugs for inflammation like ketorolac or prednisone -pemetrexed -probenecid -warfarin This list may not describe all possible interactions. Give your health care provider a list of all the medicines, herbs, non-prescription drugs, or dietary supplements you use. Also tell them if you smoke, drink alcohol, or use illegal drugs. Some items may interact with your medicine. What should I watch for while using this medicine? Tell your doctor or health care professional if your pain does not get better. Talk to your doctor before taking another medicine for pain. Do not treat yourself. This medicine does not prevent heart attack or stroke. In fact, this medicine may increase the chance of a heart attack or stroke. The chance may increase with longer use of this medicine and in people who have heart disease. If you take aspirin to prevent heart attack or stroke, talk with your doctor or health care professional. Do not take other medicines  that contain aspirin, ibuprofen, or naproxen with this medicine. Side effects such as stomach upset, nausea, or ulcers may be more likely to occur. Many medicines available without a prescription should not be taken with this medicine. This medicine can  cause ulcers and bleeding in the stomach and intestines at any time during treatment. Do not smoke cigarettes or drink alcohol. These increase irritation to your stomach and can make it more susceptible to damage from this medicine. Ulcers and bleeding can happen without warning symptoms and can cause death. You may get drowsy or dizzy. Do not drive, use machinery, or do anything that needs mental alertness until you know how this medicine affects you. Do not stand or sit up quickly, especially if you are an older patient. This reduces the risk of dizzy or fainting spells. This medicine can cause you to bleed more easily. Try to avoid damage to your teeth and gums when you brush or floss your teeth. What side effects may I notice from receiving this medicine? Side effects that you should report to your doctor or health care professional as soon as possible: -black or bloody stools, blood in the urine or vomit -blurred vision -chest pain -difficulty breathing or wheezing -nausea or vomiting -severe stomach pain -skin rash, skin redness, blistering or peeling skin, hives, or itching -slurred speech or weakness on one side of the body -swelling of eyelids, throat, lips -unexplained weight gain or swelling -unusually weak or tired -yellowing of eyes or skin Side effects that usually do not require medical attention (report to your doctor or health care professional if they continue or are bothersome): -constipation -headache -heartburn This list may not describe all possible side effects. Call your doctor for medical advice about side effects. You may report side effects to FDA at 1-800-FDA-1088. Where should I keep my medicine? Keep out of the reach of children. Store at room temperature between 15 and 30 degrees C (59 and 86 degrees F). Keep container tightly closed. Throw away any unused medicine after the expiration date. NOTE: This sheet is a summary. It may not cover all possible  information. If you have questions about this medicine, talk to your doctor, pharmacist, or health care provider.  2014, Elsevier/Gold Standard. (2009-05-29 20:10:16)

## 2013-09-16 NOTE — ED Provider Notes (Signed)
CSN: [id number]     Arrival date & time 09/15/13  2314 History   First MD Initiated Contact with Patient 09/16/13 [phone]     Chief Complaint  Patient presents with  . Torticollis     (Consider location/radiation/quality/duration/timing/severity/associated sxs/prior Treatment) The history is provided by the patient.  He woke up about 5 weeks ago with pain in the left side of his neck posteriorly. He started to supplement on cause no pain with some radiation of pain toward the left shoulder. It is getting progressively worse and he now rates pain at 9/10. Pain is worse with moving his head and better if he holds his head still. He denies any weakness or numbness or tingling. He denies any bowel or bladder dysfunction. He denies any trauma. He saw his PCP who gave him a prescription for a muscle laxity but it did not help. He does take methadone which does give him temporary relief.  Past Medical History  Diagnosis Date  . Coronary artery disease   . Hypertension   . High cholesterol   . Diabetes mellitus   . LBBB (left bundle branch block)   . Paroxysmal atrial fibrillation   . Pneumonia 10-2012  . CHF (congestive heart failure)   . Renal disorder     kidney cancer  . Cancer 2010  . Hepatitis many yrs ago    no current liver problems per pt. reportedly Hepatitis C  . Disc     6 discs loswer back, back brace prn  . Anemia   . Sleep apnea     cpap setting of 4, or uses oxygen 4 liters per [place]   Past Surgical History  Procedure Laterality Date  . Nephrectomy Right 2010  . Esophagogastroduodenoscopy Left 10/02/2012    Dr. Paulita Fujita: normal   . Colonoscopy Left 10/03/2012    Dr. Michail Sermon: poorly prepped colon, lipomatous ileocecal valve. Path revealed focal acute inflammation with question of NSAID-induced injury.   . Back surgery  1960's and 1970's    lower  . Left ring finger traumatic amputation      jumped out of truck, finger caught  . Coronary angioplasty with stent placement  yrs  ago    x 5 or 6--DES TO LAD AND RAMUS   Family History  Problem Relation Age of Onset  . Hyperlipidemia Mother   . Heart attack Father   . Diabetes Mother   . Colon cancer Neg Hx   . Crohn's disease Neg Hx   . Ulcerative colitis Neg Hx    History  Substance Use Topics  . Smoking status: Passive Smoke Exposure - Never Smoker  . Smokeless tobacco: Never Used  . Alcohol Use: No     Comment: quit ETOH 25-30 years ago    Review of Systems  All other systems reviewed and are negative.     Allergies  Review of patient's allergies indicates no known allergies.  Home Medications   Current Outpatient Rx  Name  Route  Sig  Dispense  Refill  . acetaminophen (TYLENOL) 650 MG CR tablet   Oral   Take by mouth every 8 (eight) hours as needed for pain.         Marland Kitchen amiodarone (PACERONE) 200 MG tablet   Oral   Take 200 mg by mouth daily.         . cetirizine (ZYRTEC) 10 MG tablet   Oral   Take 10 mg by mouth daily.         Marland Kitchen  COMBIVENT RESPIMAT 20-100 MCG/ACT AERS respimat               . diltiazem (TIAZAC) 360 MG 24 hr capsule   Oral   Take 360 mg by mouth daily.         Marland Kitchen docusate sodium (COLACE) 100 MG capsule   Oral   Take 100 mg by mouth daily.         . furosemide (LASIX) 40 MG tablet   Oral   Take 40 mg by mouth daily.         Marland Kitchen HYDROcodone-acetaminophen (NORCO) 10-325 MG per tablet               . Insulin Glargine (LANTUS SOLOSTAR) 100 UNIT/ML SOPN   Subcutaneous   Inject 40 Units into the skin at bedtime.   15 mL   6   . methadone (DOLOPHINE) 5 MG tablet   Oral   Take 5 mg by mouth daily as needed for pain.          . metoprolol (LOPRESSOR) 50 MG tablet   Oral   Take 50 mg by mouth daily.          . nitroGLYCERIN (NITRODUR - DOSED IN MG/24 HR) 0.2 mg/hr patch   Transdermal   Place 1 patch (0.2 mg total) onto the skin daily.   30 patch   11   . nitroGLYCERIN (NITROSTAT) 0.4 MG SL tablet   Sublingual   Place 1 tablet (0.4 mg  total) under the tongue every 5 (five) minutes as needed. Chest pain   25 tablet   5   . pantoprazole (PROTONIX) 40 MG tablet      TAKE ONE TABLET BY MOUTH DAILY.   30 tablet   11   . QUEtiapine (SEROQUEL) 50 MG tablet   Oral   Take 1 tablet (50 mg total) by mouth at bedtime.   30 tablet   0   . tamsulosin (FLOMAX) 0.4 MG CAPS capsule   Oral   Take 0.4 mg by mouth daily.          BP 123/57  Pulse 66  Temp(Src) 98.2 F (36.8 C) (Oral)  Resp 16  Ht 5' 7.5" (1.715 m)  Wt 246 lb (111.585 kg)  BMI 37.94 kg/m2  SpO2 95% Physical Exam  Nursing note and vitals reviewed.  73 year old male, resting comfortably and in no acute distress. Vital signs are normal. Oxygen saturation is 95%, which is normal. Head is normocephalic and atraumatic. PERRLA, EOMI. Oropharynx is clear. Neck: There is mild spasm of the left paracervical muscles. This is mildly tender. Pain is elicited with a rotating the head to the left. There is no adenopathy or JVD. Back is nontender and there is no CVA tenderness. Lungs are clear without rales, wheezes, or rhonchi. Chest is nontender. Heart has regular rate and rhythm without murmur. Abdomen is soft, flat, nontender without masses or hepatosplenomegaly and peristalsis is normoactive. Extremities have no cyanosis or edema, full range of motion is present. Skin is warm and dry without rash. Neurologic: Mental status is normal, cranial nerves are intact, there are no motor or sensory deficits.  ED Course  Procedures (including critical care time) Imaging Review Ct Cervical Spine Wo Contrast  09/16/2013   CLINICAL DATA:  Left-sided neck pain for 1 month.  No recent injury.  EXAM: CT CERVICAL SPINE WITHOUT CONTRAST  TECHNIQUE: Multidetector CT imaging of the cervical spine was performed without intravenous contrast. Multiplanar CT  image reconstructions were also generated.  COMPARISON:  None.  FINDINGS: There is a lytic lesion replacing the left C3 articular  process, extending into the left lamina. The mass has extra-articular extension, involving the left transverse foramen and encroaching on the left C3-4 foramen. There is a 8 mm ill-defined lucency within the anterior T1 body.  No acute fracture or subluxation. Diffuse degenerative disc disease without evidence of high-grade osseous canal stenosis. 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  3 mm right upper lobe pulmonary nodule.  These results were called by telephone at the time of interpretation on 09/16/2013 at 4:37 AM to Dr. Delora Fuel , who verbally acknowledged these results.  IMPRESSION: 1. 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 The lesion involves the left C3-4 foramen and left transverse foramen, recommend enhanced MRI follow-up. 2. Possible additional lesion in the T1 body. 3. 3 mm right upper lobe pulmonary nodule.   Electronically Signed   By: Jorje Guild M.D.   On: 09/16/2013 04:39   Images viewed by me, discussed with radiologist.  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 He has not been on any NSAIDs so he'll be given a dose of ibuprofen. He'll be sent for CT of the cervical spine. Old records are reviewed and negative for any prior visits for her neck related complaints.  CT shows destruction of the lateral aspect of C3. His old records were reviewed in greater detail and he had been referred to [facility] for management of renal carcinoma in 2008. Patient states that he was deemed to be cancer free following surgery. This bony destruction is likely from late metastasis from renal carcinoma, but other primary would also need to be considered. He is referred to neurosurgery and also back to his PCP. He will need MRI as an outpatient  and likely will need tissue diagnosis to guide treatment decisions. Of note, he did get significant relief of pain following dose of ibuprofen so he is given a prescription for naproxen.  Delora Fuel, MD 32/35/57 [id number]

## 2013-10-07 ENCOUNTER — Other Ambulatory Visit (HOSPITAL_COMMUNITY): Payer: Self-pay | Admitting: Neurosurgery

## 2013-10-07 DIAGNOSIS — R220 Localized swelling, mass and lump, head: Secondary | ICD-10-CM

## 2013-10-07 DIAGNOSIS — M542 Cervicalgia: Secondary | ICD-10-CM

## 2013-10-07 DIAGNOSIS — S161XXA Strain of muscle, fascia and tendon at neck level, initial encounter: Secondary | ICD-10-CM

## 2013-10-07 DIAGNOSIS — M5410 Radiculopathy, site unspecified: Secondary | ICD-10-CM

## 2013-10-07 DIAGNOSIS — G459 Transient cerebral ischemic attack, unspecified: Secondary | ICD-10-CM

## 2013-10-07 DIAGNOSIS — R221 Localized swelling, mass and lump, neck: Principal | ICD-10-CM

## 2013-10-07 DIAGNOSIS — I7090 Unspecified atherosclerosis: Secondary | ICD-10-CM

## 2013-10-07 DIAGNOSIS — IMO0002 Reserved for concepts with insufficient information to code with codable children: Secondary | ICD-10-CM

## 2013-10-07 DIAGNOSIS — R229 Localized swelling, mass and lump, unspecified: Secondary | ICD-10-CM

## 2013-10-07 DIAGNOSIS — R609 Edema, unspecified: Secondary | ICD-10-CM

## 2013-10-07 DIAGNOSIS — R569 Unspecified convulsions: Secondary | ICD-10-CM

## 2013-10-07 DIAGNOSIS — R52 Pain, unspecified: Secondary | ICD-10-CM

## 2013-10-08 ENCOUNTER — Ambulatory Visit (HOSPITAL_COMMUNITY)
Admission: RE | Admit: 2013-10-08 | Discharge: 2013-10-08 | Disposition: A | Discharge status: Home / Self Care | Payer: Medicare Other | Source: Ambulatory Visit | Attending: Neurosurgery | Admitting: Neurosurgery

## 2013-10-08 ENCOUNTER — Other Ambulatory Visit (HOSPITAL_COMMUNITY): Payer: Self-pay | Admitting: Neurosurgery

## 2013-10-08 DIAGNOSIS — R937 Abnormal findings on diagnostic imaging of other parts of musculoskeletal system: Secondary | ICD-10-CM

## 2013-10-08 DIAGNOSIS — R221 Localized swelling, mass and lump, neck: Secondary | ICD-10-CM

## 2013-10-08 DIAGNOSIS — M5380 Other specified dorsopathies, site unspecified: Secondary | ICD-10-CM | POA: Insufficient documentation

## 2013-10-08 DIAGNOSIS — D492 Neoplasm of unspecified behavior of bone, soft tissue, and skin: Secondary | ICD-10-CM | POA: Insufficient documentation

## 2013-10-08 DIAGNOSIS — G459 Transient cerebral ischemic attack, unspecified: Secondary | ICD-10-CM | POA: Insufficient documentation

## 2013-10-08 DIAGNOSIS — M542 Cervicalgia: Secondary | ICD-10-CM | POA: Insufficient documentation

## 2013-10-08 DIAGNOSIS — I6509 Occlusion and stenosis of unspecified vertebral artery: Secondary | ICD-10-CM

## 2013-10-08 DIAGNOSIS — R51 Headache: Secondary | ICD-10-CM | POA: Insufficient documentation

## 2013-10-08 DIAGNOSIS — M47812 Spondylosis without myelopathy or radiculopathy, cervical region: Secondary | ICD-10-CM | POA: Insufficient documentation

## 2013-10-08 DIAGNOSIS — M4802 Spinal stenosis, cervical region: Secondary | ICD-10-CM | POA: Insufficient documentation

## 2013-10-08 DIAGNOSIS — M25519 Pain in unspecified shoulder: Secondary | ICD-10-CM | POA: Insufficient documentation

## 2013-10-08 LAB — POCT I-STAT CREATININE: CREATININE: 1.5 mg/dL — AB (ref 0.50–1.35)

## 2013-10-08 MED ORDER — GADOBENATE DIMEGLUMINE 529 MG/ML IV SOLN
20.0000 mL | Freq: Once | INTRAVENOUS | Status: AC | PRN
  Administered 2013-10-08: 20 mL via INTRAVENOUS

## 2013-10-08 MED ORDER — SODIUM CHLORIDE 0.9 % IV SOLN
INTRAVENOUS | Status: DC
Start: 2013-10-08 — End: 2013-10-09
  Filled 2013-10-08: qty 250

## 2013-10-12 ENCOUNTER — Other Ambulatory Visit (HOSPITAL_COMMUNITY): Payer: Self-pay | Admitting: Neurosurgery

## 2013-10-12 DIAGNOSIS — R229 Localized swelling, mass and lump, unspecified: Secondary | ICD-10-CM

## 2013-10-14 ENCOUNTER — Other Ambulatory Visit: Payer: Self-pay | Admitting: Radiology

## 2013-10-18 ENCOUNTER — Other Ambulatory Visit: Payer: Self-pay | Admitting: Radiology

## 2013-10-18 ENCOUNTER — Ambulatory Visit (HOSPITAL_COMMUNITY): Admission: RE | Admit: 2013-10-18 | Payer: Medicare Other | Source: Ambulatory Visit

## 2013-10-21 ENCOUNTER — Other Ambulatory Visit: Payer: Self-pay | Admitting: Radiology

## 2013-10-22 ENCOUNTER — Encounter (HOSPITAL_COMMUNITY): Payer: Self-pay

## 2013-10-22 ENCOUNTER — Ambulatory Visit (HOSPITAL_COMMUNITY)
Admission: RE | Admit: 2013-10-22 | Discharge: 2013-10-22 | Disposition: A | Discharge status: Home / Self Care | Payer: Medicare Other | Source: Ambulatory Visit | Attending: Neurosurgery | Admitting: Neurosurgery

## 2013-10-22 DIAGNOSIS — M949 Disorder of cartilage, unspecified: Principal | ICD-10-CM

## 2013-10-22 DIAGNOSIS — R229 Localized swelling, mass and lump, unspecified: Secondary | ICD-10-CM

## 2013-10-22 DIAGNOSIS — C419 Malignant neoplasm of bone and articular cartilage, unspecified: Secondary | ICD-10-CM

## 2013-10-22 DIAGNOSIS — M899 Disorder of bone, unspecified: Secondary | ICD-10-CM | POA: Insufficient documentation

## 2013-10-22 DIAGNOSIS — Z85528 Personal history of other malignant neoplasm of kidney: Secondary | ICD-10-CM | POA: Insufficient documentation

## 2013-10-22 HISTORY — DX: Malignant neoplasm of bone and articular cartilage, unspecified: C41.9

## 2013-10-22 LAB — GLUCOSE, CAPILLARY
Glucose-Capillary: 140 mg/dL — ABNORMAL HIGH (ref 70–99)
Glucose-Capillary: 134 mg/dL — ABNORMAL HIGH (ref 70–99)

## 2013-10-22 LAB — PROTIME-INR
INR: 0.96 (ref 0.00–1.49)
Prothrombin Time: 12.6 seconds (ref 11.6–15.2)

## 2013-10-22 LAB — CBC
HCT: 39.3 % (ref 39.0–52.0)
Hemoglobin: 12.9 g/dL — ABNORMAL LOW (ref 13.0–17.0)
MCH: 26.3 pg (ref 26.0–34.0)
MCHC: 32.8 g/dL (ref 30.0–36.0)
MCV: 80.2 fL (ref 78.0–100.0)
PLATELETS: 235 10*3/uL (ref 150–400)
RBC: 4.9 MIL/uL (ref 4.22–5.81)
RDW: 15.1 % (ref 11.5–15.5)
WBC: 6.4 10*3/uL (ref 4.0–10.5)

## 2013-10-22 LAB — APTT: APTT: 20 s — AB (ref 24–37)

## 2013-10-22 MED ORDER — MIDAZOLAM HCL 2 MG/2ML IJ SOLN
INTRAMUSCULAR | Status: AC | PRN
  Administered 2013-10-22 (×3): 2 mg via INTRAVENOUS

## 2013-10-22 MED ORDER — FENTANYL CITRATE 0.05 MG/ML IJ SOLN
INTRAMUSCULAR | Status: AC
  Filled 2013-10-22: qty 4

## 2013-10-22 MED ORDER — SODIUM CHLORIDE 0.9 % IV SOLN
Freq: Once | INTRAVENOUS | Status: AC
  Administered 2013-10-22: 08:00:00 via INTRAVENOUS

## 2013-10-22 MED ORDER — FENTANYL CITRATE 0.05 MG/ML IJ SOLN
INTRAMUSCULAR | Status: AC | PRN
  Administered 2013-10-22 (×3): 50 ug via INTRAVENOUS

## 2013-10-22 MED ORDER — LIDOCAINE HCL 1 % IJ SOLN
INTRAMUSCULAR | Status: AC
  Filled 2013-10-22: qty 10

## 2013-10-22 MED ORDER — MIDAZOLAM HCL 2 MG/2ML IJ SOLN
INTRAMUSCULAR | Status: AC
  Filled 2013-10-22: qty 6

## 2013-10-22 NOTE — H&P (Signed)
Chief Complaint: "I am here for a biopsy." Referring Physician: Dr. Hal Neer HPI: [name] is an 73 y.o. male with history of renal cell carcinoma with c/o left sided neck pain x 7 weeks. Recent imaging reveals left sided cervical level 3 lytic bone lesion. IR received request for image guided biopsy. He denies any chest pain, shortness of breath or palpitations. He denies any active signs of bleeding or excessive bruising. He denies any recent fever or chills. The patient admits to sleep apnea and he does use his CPAP daily. He denies any chronic oxygen use. He has previously tolerated sedation without complications.    Past Medical History:  Past Medical History  Diagnosis Date  . Coronary artery disease   . Hypertension   . High cholesterol   . Diabetes mellitus   . LBBB (left bundle branch block)   . Paroxysmal atrial fibrillation   . Pneumonia 10-2012  . CHF (congestive heart failure)   . Renal disorder     kidney cancer  . Cancer 2010  . Hepatitis many yrs ago    no current liver problems per pt. reportedly Hepatitis C  . Disc     6 discs loswer back, back brace prn  . Anemia   . Sleep apnea     cpap setting of 4, or uses oxygen 4 liters per [place]    Past Surgical History:  Past Surgical History  Procedure Laterality Date  . Nephrectomy Right 2010  . Esophagogastroduodenoscopy Left 10/02/2012    Dr. Paulita Fujita: normal   . Colonoscopy Left 10/03/2012    Dr. Michail Sermon: poorly prepped colon, lipomatous ileocecal valve. Path revealed focal acute inflammation with question of NSAID-induced injury.   . Back surgery  1960's and 1970's    lower  . Left ring finger traumatic amputation      jumped out of truck, finger caught  . Coronary angioplasty with stent placement  yrs ago    x 5 or 6--DES TO LAD AND RAMUS    Family History:  Family History  Problem Relation Age of Onset  . Hyperlipidemia Mother   . Heart attack Father   . Diabetes Mother   . Colon cancer Neg Hx   .  Crohn's disease Neg Hx   . Ulcerative colitis Neg Hx     Social History:  reports that he has been passively smoking.  He has never used smokeless tobacco. He reports that he does not drink alcohol or use illicit drugs.  Allergies: No Known Allergies  Medications:   Medication List    ASK your doctor about these medications       acetaminophen 650 MG CR tablet  Commonly known as:  TYLENOL  Take by mouth every 8 (eight) hours as needed for pain.     amiodarone 200 MG tablet  Commonly known as:  PACERONE  Take 200 mg by mouth daily.     cetirizine 10 MG tablet  Commonly known as:  ZYRTEC  Take 10 mg by mouth daily.     COMBIVENT RESPIMAT 20-100 MCG/ACT Aers respimat  Generic drug:  Ipratropium-Albuterol     diltiazem 360 MG 24 hr capsule  Commonly known as:  TIAZAC  Take 360 mg by mouth daily.     docusate sodium 100 MG capsule  Commonly known as:  COLACE  Take 100 mg by mouth daily.     furosemide 40 MG tablet  Commonly known as:  LASIX  Take 40 mg by mouth daily.  HYDROcodone-acetaminophen 10-325 MG per tablet  Commonly known as:  NORCO     Insulin Glargine 100 UNIT/ML Solostar Pen  Commonly known as:  LANTUS SOLOSTAR  Inject 40 Units into the skin at bedtime.     methadone 5 MG tablet  Commonly known as:  DOLOPHINE  Take 5 mg by mouth daily as needed for pain.     metoprolol 50 MG tablet  Commonly known as:  LOPRESSOR  Take 50 mg by mouth daily.     naproxen 375 MG tablet  Commonly known as:  NAPROSYN  Take 1 tablet (375 mg total) by mouth 2 (two) times daily.     nitroGLYCERIN 0.2 mg/hr patch  Commonly known as:  NITRODUR - Dosed in mg/24 hr  Place 1 patch (0.2 mg total) onto the skin daily.     nitroGLYCERIN 0.4 MG SL tablet  Commonly known as:  NITROSTAT  Place 1 tablet (0.4 mg total) under the tongue every 5 (five) minutes as needed. Chest pain     oxyCODONE-acetaminophen 7.5-325 MG per tablet  Commonly known as:  PERCOCET      pantoprazole 40 MG tablet  Commonly known as:  PROTONIX  TAKE ONE TABLET BY MOUTH DAILY.     predniSONE 10 MG tablet  Commonly known as:  DELTASONE     QUEtiapine 50 MG tablet  Commonly known as:  SEROQUEL  Take 1 tablet (50 mg total) by mouth at bedtime.     tamsulosin 0.4 MG Caps capsule  Commonly known as:  FLOMAX  Take 0.4 mg by mouth daily.       Please HPI for pertinent positives, otherwise complete 10 system ROS negative.  Physical Exam: BP 149/71  Pulse 63  Temp(Src) 97.6 F (36.4 C) (Oral)  Resp 20  Ht 5' 7.5" (1.715 m)  Wt 246 lb (111.585 kg)  BMI 37.94 kg/m2  SpO2 94% Body mass index is 37.94 kg/(m^2).  General Appearance:  Alert, cooperative, no distress  Head:  Normocephalic, without obvious abnormality, atraumatic  Neck: Supple, symmetrical, trachea midline  Lungs:   Diminished throughout, no w/r/r, respirations unlabored without use of accessory muscles.  Chest Wall:  No tenderness or deformity  Heart:  Regular rate and rhythm, S1, S2 normal, no murmur, rub or gallop.  Abdomen:   Soft, non-tender, non distended, (+) BS  Extremities: Extremities normal, atraumatic, no cyanosis or edema  Pulses: 1+ and symmetric  Neurologic: Normal affect, no gross deficits.   Results for orders placed during the hospital encounter of 10/22/13 (from the past 48 hour(s))  GLUCOSE, CAPILLARY     Status: Abnormal   Collection Time    10/22/13  7:27 AM      Result Value Ref Range   Glucose-Capillary 140 (*) 70 - 99 mg/dL   No results found.  Assessment/Plan History of renal cell carcinoma Left Cervical level 3 lytic bone lesion Request for image guided biopsy Patient has been NPO with a small amount of OJ taken at 0630 for hypoglycemic symptoms, no blood thinners Labs and images reviewed. OSA, uses CPAP. Risks and Benefits discussed with the patient. All of the patient's questions were answered, patient is agreeable to proceed. Consent signed and in chart.  Hedy Jacob PA-C 10/22/2013, 8:00 AM

## 2013-10-22 NOTE — Discharge Instructions (Signed)
Biopsy °Care After °Refer to this sheet in the next few weeks. These instructions provide you with information on caring for yourself after your procedure. Your caregiver may also give you more specific instructions. Your treatment has been planned according to current medical practices, but problems sometimes occur. Call your caregiver if you have any problems or questions after your procedure. °If you had a fine needle biopsy, you may have soreness at the biopsy site for 1 to 2 days. If you had an open biopsy, you may have soreness at the biopsy site for 3 to 4 days. °HOME CARE INSTRUCTIONS  °· You may resume normal diet and activities as directed. °· Change bandages (dressings) as directed. If your wound was closed with a skin glue (adhesive), it will wear off and begin to peel in 7 days. °· Only take over-the-counter or prescription medicines for pain, discomfort, or fever as directed by your caregiver. °· Ask your caregiver when you can bathe and get your wound wet. °SEEK IMMEDIATE MEDICAL CARE IF:  °· You have increased bleeding (more than a small spot) from the biopsy site. °· You notice redness, swelling, or increasing pain at the biopsy site. °· You have pus coming from the biopsy site. °· You have a fever. °· You notice a bad smell coming from the biopsy site or dressing. °· You have a rash, have difficulty breathing, or have any allergic problems. °MAKE SURE YOU:  °· Understand these instructions. °· Will watch your condition. °· Will get help right away if you are not doing well or get worse. °Document Released: 12/14/2004 Document Revised: 08/19/2011 Document Reviewed: 11/22/2010 °[employer]® Patient Information ©2014 [employer]. ° °

## 2013-10-22 NOTE — Progress Notes (Signed)
Patient ID: [name], male   DOB: 02-Sep-1940, 73 y.o.   MRN: [id number] I have reviewed the image. The C3 lesion is high risk. I have performed a CT CAP in preporation for a second biopsy site and found widespread mets, including a lytic lesion at T10. I will proceed with a T10 biopsy.

## 2013-10-22 NOTE — Procedures (Signed)
T10 bone lesion biopsy No comp

## 2013-11-02 ENCOUNTER — Telehealth: Payer: Self-pay | Admitting: Oncology

## 2013-11-02 NOTE — Telephone Encounter (Signed)
S/W PATIENT AND GAVE NP APPT FOR 06/3 @ 10:30 W/DR. Viola DX- MASS BIOSPY WELCOME PACKET MAILED.

## 2013-11-02 NOTE — Telephone Encounter (Signed)
C/D 11/02/13 for appt. 11/10/13

## 2013-11-03 ENCOUNTER — Other Ambulatory Visit: Payer: Self-pay | Admitting: Oncology

## 2013-11-03 DIAGNOSIS — C649 Malignant neoplasm of unspecified kidney, except renal pelvis: Secondary | ICD-10-CM

## 2013-11-09 ENCOUNTER — Telehealth: Payer: Self-pay | Admitting: Medical Oncology

## 2013-11-09 NOTE — Telephone Encounter (Signed)
Courtesy call to pt regarding tomorrow's appt. Pt confirmed appt, knows to bring current list of medications. No questions at this time.

## 2013-11-10 ENCOUNTER — Telehealth: Payer: Self-pay | Admitting: *Deleted

## 2013-11-10 ENCOUNTER — Encounter: Payer: Self-pay | Admitting: Oncology

## 2013-11-10 ENCOUNTER — Ambulatory Visit: Payer: Medicare Other

## 2013-11-10 ENCOUNTER — Telehealth: Payer: Self-pay | Admitting: Oncology

## 2013-11-10 ENCOUNTER — Other Ambulatory Visit: Payer: Self-pay | Admitting: Medical Oncology

## 2013-11-10 ENCOUNTER — Ambulatory Visit (HOSPITAL_BASED_OUTPATIENT_CLINIC_OR_DEPARTMENT_OTHER): Payer: Medicare Other | Admitting: Oncology

## 2013-11-10 ENCOUNTER — Other Ambulatory Visit (HOSPITAL_BASED_OUTPATIENT_CLINIC_OR_DEPARTMENT_OTHER): Payer: Medicare Other

## 2013-11-10 VITALS — BP 139/64 | HR 59 | Temp 98.2°F | Resp 20 | Ht 67.5 in | Wt 228.8 lb

## 2013-11-10 DIAGNOSIS — Z794 Long term (current) use of insulin: Principal | ICD-10-CM

## 2013-11-10 DIAGNOSIS — IMO0001 Reserved for inherently not codable concepts without codable children: Secondary | ICD-10-CM

## 2013-11-10 DIAGNOSIS — E119 Type 2 diabetes mellitus without complications: Secondary | ICD-10-CM

## 2013-11-10 DIAGNOSIS — C649 Malignant neoplasm of unspecified kidney, except renal pelvis: Secondary | ICD-10-CM

## 2013-11-10 DIAGNOSIS — M949 Disorder of cartilage, unspecified: Secondary | ICD-10-CM

## 2013-11-10 DIAGNOSIS — M899 Disorder of bone, unspecified: Secondary | ICD-10-CM

## 2013-11-10 DIAGNOSIS — E1169 Type 2 diabetes mellitus with other specified complication: Secondary | ICD-10-CM

## 2013-11-10 LAB — COMPREHENSIVE METABOLIC PANEL (CC13)
ALT: 23 U/L (ref 0–55)
AST: 13 U/L (ref 5–34)
Albumin: 3 g/dL — ABNORMAL LOW (ref 3.5–5.0)
Alkaline Phosphatase: 93 U/L (ref 40–150)
Anion Gap: 14 mEq/L — ABNORMAL HIGH (ref 3–11)
BUN: 15.5 mg/dL (ref 7.0–26.0)
CO2: 32 mEq/L — ABNORMAL HIGH (ref 22–29)
Calcium: 9.1 mg/dL (ref 8.4–10.4)
Chloride: 97 mEq/L — ABNORMAL LOW (ref 98–109)
Creatinine: 1.4 mg/dL — ABNORMAL HIGH (ref 0.7–1.3)
Glucose: 136 mg/dl (ref 70–140)
Potassium: 4.1 mEq/L (ref 3.5–5.1)
Sodium: 143 mEq/L (ref 136–145)
Total Bilirubin: 0.46 mg/dL (ref 0.20–1.20)
Total Protein: 6.8 g/dL (ref 6.4–8.3)

## 2013-11-10 LAB — CBC WITH DIFFERENTIAL/PLATELET
BASO%: 0.9 % (ref 0.0–2.0)
BASOS ABS: 0.1 10*3/uL (ref 0.0–0.1)
EOS%: 1.4 % (ref 0.0–7.0)
Eosinophils Absolute: 0.1 10*3/uL (ref 0.0–0.5)
HEMATOCRIT: 37.9 % — AB (ref 38.4–49.9)
HGB: 12.3 g/dL — ABNORMAL LOW (ref 13.0–17.1)
LYMPH%: 16.3 % (ref 14.0–49.0)
MCH: 25.8 pg — ABNORMAL LOW (ref 27.2–33.4)
MCHC: 32.4 g/dL (ref 32.0–36.0)
MCV: 79.6 fL (ref 79.3–98.0)
MONO#: 0.7 10*3/uL (ref 0.1–0.9)
MONO%: 10.9 % (ref 0.0–14.0)
NEUT#: 4.4 10*3/uL (ref 1.5–6.5)
NEUT%: 70.5 % (ref 39.0–75.0)
PLATELETS: 248 10*3/uL (ref 140–400)
RBC: 4.76 10*6/uL (ref 4.20–5.82)
RDW: 15.9 % — ABNORMAL HIGH (ref 11.0–14.6)
WBC: 6.3 10*3/uL (ref 4.0–10.3)
lymph#: 1 10*3/uL (ref 0.9–3.3)

## 2013-11-10 LAB — WHOLE BLOOD GLUCOSE
GLUCOSE: 149 mg/dL — AB (ref 70–100)
HRS PC: 5 Hours

## 2013-11-10 LAB — LACTATE DEHYDROGENASE (CC13): LDH: 187 U/L (ref 125–245)

## 2013-11-10 NOTE — Progress Notes (Signed)
Please see consult note.  

## 2013-11-10 NOTE — Consult Note (Signed)
Reason for Referral: Kidney cancer.   HPI: This is a 73 year old gentleman currently of [place], [place] where he lived for a while. He is a gentleman with past medical history significant for coronary artery disease, and other comorbid conditions. He recalls about 6 years ago developing acute episode of hematuria and was evaluated urgently at [facility]. He was found to have a kidney tumor and underwent a nephrectomy.  He underwent a right open radical nephrectomy done on 06/19/2007 by Dr. Brendia Sacks. The pathology is reported as grade 2 RCC. He was followed there routinely with imaging studies and did not show any cancer relapse. It is unclear to me when with his last CT scan that I see documentation dating back to October 2013 indicating no evidence of cancer. Most recently, he developed left-sided neck pain and presented to the emergency department on 09/16/2013. Imaging studies include CT scan of that area showed a lytic lesion replacing the left C3 articular process extending into the left lamina. The mass has extra-articular extension involving the left transverse foramen. The lesion involves C3 and C4 foramen and left transverse process. Patient was referred to neurosurgery and was evaluated by Dr. Hal Neer and an MRI was obtained on 10/08/2013 which confirmed the presence of a left C3 tumor compatible with metastatic disease. The extension of the tumor into the left C4 neural foramina was also documented. No other metastatic lesions were identified in the cervical spine. A CT-guided biopsy was done on 10/22/2013 of T10 bone lesion was suspected epidural spread at that area. Pathology confirmed the presence of metastatic renal cell carcinoma. Patient was referred to me for further evaluation.   Clinically, patient is quite symptomatic predominantly from his neck pain. He has not reported any neurological symptoms however. He has not reported any weakness in his  upper extremities. Has not reported any weakness in his lower extremities. His pain is described as constant neuropathic nonradiating pain. Dated 10 out of 10 and alleviated by hydrocodone. He did not report any headaches or syncope. He did not report any change in his vision or alteration of mental status or seizures. He did not report any chest pain shortness of breath cough or hemoptysis. Does not report any palpitation or orthopnea. He does report lower extremity edema. He does not report any nausea but does occasional vomiting. He does not report any change in his bowel habits does not report any hematochezia or melena. Does not report any frequency urgency or hesitancy but does not report hematuria. He does not report any back pain shoulder pain or hip pain. He did have right-sided flank pain that have subsided at this time. He does not report any skin rashes or lesions. He does not report any petechiae. He does not report any lymphadenopathy or skin rashes or lesions. He does report occasional anxiety and depression. He did reports weight loss and poor appetite. The did not report any fevers or chills or sweats. Rest of his review of system is unremarkable.   Past Medical History  Diagnosis Date  . Coronary artery disease   . Hypertension   . High cholesterol   . Diabetes mellitus   . LBBB (left bundle branch block)   . Paroxysmal atrial fibrillation   . Pneumonia 10-2012  . CHF (congestive heart failure)   . Renal disorder     kidney cancer  . Cancer 2010  . Hepatitis many yrs ago    no current liver problems per pt. reportedly  Hepatitis C  . Disc     6 discs loswer back, back brace prn  . Anemia   . Sleep apnea     cpap setting of 4, or uses oxygen 4 liters per [place]  :  Past Surgical History  Procedure Laterality Date  . Nephrectomy Right 2010  . Esophagogastroduodenoscopy Left 10/02/2012    Dr. Paulita Fujita: normal   . Colonoscopy Left 10/03/2012    Dr. Michail Sermon: poorly prepped colon,  lipomatous ileocecal valve. Path revealed focal acute inflammation with question of NSAID-induced injury.   . Back surgery  1960's and 1970's    lower  . Left ring finger traumatic amputation      jumped out of truck, finger caught  . Coronary angioplasty with stent placement  yrs ago    x 5 or 6--DES TO LAD AND RAMUS  :  Current Outpatient Prescriptions  Medication Sig Dispense Refill  . amiodarone (PACERONE) 200 MG tablet Take 200 mg by mouth daily.      . B-D ULTRAFINE III SHORT PEN 31G X 8 MM MISC       . COMBIVENT RESPIMAT 20-100 MCG/ACT AERS respimat       . furosemide (LASIX) 40 MG tablet Take 40 mg by mouth daily.      Marland Kitchen HYDROcodone-acetaminophen (NORCO) 10-325 MG per tablet Take 1 tablet by mouth every 6 (six) hours as needed for moderate pain.       . Insulin Glargine (LANTUS) 100 UNIT/ML Solostar Pen Inject 40 Units into the skin at bedtime.      . metoprolol (LOPRESSOR) 50 MG tablet Take 50 mg by mouth 2 (two) times daily.       . nitroGLYCERIN (NITRODUR - DOSED IN MG/24 HR) 0.2 mg/hr patch Place 0.2 mg onto the skin daily.      . nitroGLYCERIN (NITROSTAT) 0.4 MG SL tablet Place 1 tablet (0.4 mg total) under the tongue every 5 (five) minutes as needed. Chest pain  25 tablet  5  . pantoprazole (PROTONIX) 40 MG tablet Take 40 mg by mouth daily.      . tamsulosin (FLOMAX) 0.4 MG CAPS capsule Take 0.4 mg by mouth daily.      . methadone (DOLOPHINE) 10 MG tablet Take 10 mg by mouth daily as needed for moderate pain.       No current facility-administered medications for this visit.       No Known Allergies:  Family History  Problem Relation Age of Onset  . Hyperlipidemia Mother   . Heart attack Father   . Diabetes Mother   . Colon cancer Neg Hx   . Crohn's disease Neg Hx   . Ulcerative colitis Neg Hx   :  History   Social History  . Marital Status: Legally Separated    Spouse Name: N/A    Number of Children: N/A  . Years of Education: N/A   Occupational  History  . Not on file.   Social History Main Topics  . Smoking status: Passive Smoke Exposure - Never Smoker  . Smokeless tobacco: Never Used  . Alcohol Use: No     Comment: quit ETOH 25-30 years ago  . Drug Use: No  . Sexual Activity: Not Currently   Other Topics Concern  . Not on file   Social History Narrative  . No narrative on file  :   Exam: ECOG 1 Blood pressure 139/64, pulse 59, temperature 98.2 F (36.8 C), temperature source Oral, resp. rate 20, height 5'  7.5" (1.715 m), weight 228 lb 12.8 oz (103.783 kg). General appearance: alert and cooperative. Appeared in mild distress. Head: Normocephalic, without obvious abnormality Eyes: conjunctivae/corneas clear. PERRL.  Throat: lips, mucosa, and tongue normal; teeth and gums normal Neck: no adenopathy, supple, symmetrical, trachea midline and thyroid not enlarged. Cannot appreciate any masses or lesions. Back: symmetric, no curvature. ROM normal. No CVA tenderness. Resp: clear to auscultation bilaterally Cardio: regular rate and rhythm, S1, S2.  GI: soft, non-tender; bowel sounds normal; no masses,  no organomegaly Extremities: extremities normal, atraumatic, no cyanosis or edema Pulses: 2+ and symmetric Skin: Skin color, texture, turgor normal. No rashes or lesions Lymph nodes: Cervical, supraclavicular, and axillary nodes normal.   Recent Labs  11/10/13 1039  WBC 6.3  HGB 12.3*  HCT 37.9*  PLT 248    Recent Labs  11/10/13 1039  NA 143  K 4.1  CO2 32*  GLUCOSE 136  BUN 15.5  CREATININE 1.4*  CALCIUM 9.1      Ct Biopsy  10/22/2013   CLINICAL DATA:  Cervical spine and lesion. Initial planning was 4 cervical spine biopsy. Subsequent decision making resulted in a search for an additional biopsy site. The tentative plan is for scanning the chest abdomen and pelvis without contrast an subsequent safe bone biopsy.  EXAM: CT-GUIDED BIOPSY T10 VERTEBRAL BODY LESION  MEDICATIONS AND MEDICAL HISTORY: Versed 6  mg, Fentanyl 150 mcg.  Additional Medications: None.  ANESTHESIA/SEDATION: Moderate sedation time: 20 minutes  PROCEDURE: The procedure, risks, benefits, and alternatives were explained to the patient. Questions regarding the procedure were encouraged and answered. The patient understands and consents to the procedure.  Scanning through the chest abdomen and pelvis was performed.  The back was prepped with Betadine in a sterile fashion, and a sterile drape was applied covering the operative field. A sterile gown and sterile gloves were used for the procedure.  Under CT guidance, a(n) 17 gauge guide needle was advanced into the T10 vertebral body leak. Subsequently four 18 gauge core biopsies were obtained. The guide needle was removed. Final imaging was performed.  Patient tolerated the procedure well without complication. Vital sign monitoring by nursing staff during the procedure will continue as patient is in the special procedures unit for post procedure observation.  FINDINGS: Scanning through the chest abdomen pelvis without contrast was performed.  Abnormal mediastinal adenopathy. 3.3 cm right peritracheal lymph node. Other smaller nodes.  Bilateral pulmonary nodules. Largest on the left is in the left lower lobe measuring 16 mm on image 39. Largest on the right by this noncontrast exam is in the right upper lobe measuring 8 mm on image 21.  Destructive T10 bone lesion with suspected epidural spread of tumor. Cord compression cannot be excluded. The patient was ambulatory and denied weakness in his lower extremities.  Right nephrectomy.  Cholelithiasis.  The images document guide needle placement within the T10 vertebral body lesion. Post biopsy images demonstrate no hemorrhage.  IMPRESSION: Successful CT-guided core biopsy of the T10 vertebral body lesion.  Imaging through the chest abdomen and pelvis was performed demonstrating abnormal mediastinal adenopathy, pulmonary nodules, a T10 vertebral body lesion,  all worrisome for metastatic disease. Dedicated CT chest abdomen pelvis with contrast is recommended in the outpatient setting to further characterize.   Electronically Signed   By: Maryclare Bean M.D.   On: 10/22/2013 12:26   MRI neck 5/15 IMPRESSION:  1. Left C3 pedicle and lamina tumor is most compatible with  metastasis in this setting. Extension  of this tumor into the left C4  neural foramen. No extension of tumor into the cervical spinal canal  at this time.  2. No other metastatic lesion identified in the cervical spine or at  the skullbase.  3. Superimposed cervical spine degenerative changes resulting in  mild to moderate spinal stenosis at C3-C4, C5-C6, C6-C7. No spinal  cord signal abnormality.   CT Neck 09/16/2013 IMPRESSION:  1. Malignant bone lesion replacing the left C3 articular process,  extending into the left lamina. The lesion involves the left C3-4  foramen and left transverse foramen, recommend enhanced MRI  follow-up.  2. Possible additional lesion in the T1 body.  3. 3 mm right upper lobe pulmonary nodule.  Assessment and Plan:   73 year old gentleman with the following issues:  1. Metastatic renal cell carcinoma with initial diagnosis in 2009. He presented with hematuria and underwent a right nephrectomy done at [facility]. The exact pathology and initial staging is unclear to me but it appears to be a grade 2 renal cell carcinoma a likely clear cell histology. He was under observation and surveillance at least of 2013. He presented with neck pain in April of 2015 and found to have tumor recurrence. He has a lesion involving the C3 and C4 cervical spine as well as a T10 lesion that was biopsy proven to be metastatic renal cell carcinoma. The natural course of this disease was discussed with the patient extensively. I explained to him at this point that this cancer is not curable. The best that we can hope for his palliation of symptoms  and slowing the growth of disease. The first is to stage him with a CT scan chest abdomen and pelvis to determine the extent of his disease. From a systemic therapy standpoint, it would be a reasonable option to use tyrosine kinase inhibitors such as Sutent or Votrient. I think this will be the next up after treating his localized disease. Risks and benefits of these medications were discussed today and we will initiate them upon stabilizing his neck lesion.   2. Cervical spine mass. This will be a priority to treat at the time being. Patient had been evaluated by neurosurgery I think a multidisciplinary approach is needed at this time. I will refer him to radiation oncology for an opinion to discuss whether radiation is the best choice. If not, then surgical resection and stabilization of his cervical spine might have to be the first step. If that can be achieved with radiation therapy and that will be the next step.  3. Bone directed therapy: This will be deferred to a later date 1 systemic therapy have been initiated.  4. Neck pain: Seems to be controlled for the time being a long acting methadone pain medication and hydrocodone breakthrough.  5. Followup: I will schedule him adequate followup upon the completion of his CT scan and radiation oncology follow up.

## 2013-11-10 NOTE — Progress Notes (Signed)
Checked in new pt with no financial concerns. °

## 2013-11-10 NOTE — Telephone Encounter (Signed)
Pt in hallway waiting to see financial counselor & tech noted pt vomiting.  Pt reports that he is diabetic & requested that we check his glucose by fingerstick since he left his machine in the car or at home.  He has been vomiting all night.  Reported to Dr. Hazeline Junker nurse who will check for order.  Pt has labs ordered including C-met.  Pt informed & was given gingerale.

## 2013-11-10 NOTE — Telephone Encounter (Signed)
, °

## 2013-11-11 ENCOUNTER — Encounter: Payer: Self-pay | Admitting: Radiation Oncology

## 2013-11-11 NOTE — Progress Notes (Signed)
Histology and Location of Primary Cancer: Bone metastatic renal cell carcinoma  Diagnosis 10/22/13: Bone, biopsy, T10 vertebral - METASTATIC CLEAR CELL RENAL CELL CARCINOMA. Dr. Art A. Hoss  Sites of Visceral and Bony Metastatic Disease: left C3-C4 foramen and left transverse process, T10 epidural spread=metastatic renal cell carcinoma  Location(s) of Symptomatic Metastases: left sided neck pain  Past/Anticipated chemotherapy by medical oncology, if any: seen by Dr.Shadad 11/10/13, referral to radiation oncology, f/u after CT scan which has not been scheduled at this time.   F/u w/dr Shadad on 11/25/13  Pain on a scale of 0-10 is:  Neuropathic left neck non radiating pain that goes down his left arm, controlled with long acting methadone and hydrocodone for breakthrough pain  If Spine Met(s), symptoms, if any, include:  Bowel/Bladder retention or incontinence (please describe): hematuria  Numbness or weakness in extremities (please describe): lower extremity edema, pt states he has had  "nerve damage in both legs for many years"  Current Decadron regimen, if applicable: no  Ambulatory status? Walker? Wheelchair?: wheelchair  SAFETY ISSUES:  Prior radiation? No  Pacemaker/ICD?  No  Is the patient on methotrexate? No  Current Complaints / other details:  Hx 06/19/2007 Right Open Radical Nephrectomy done at [facility] by Dr.Hermal, no chemotherapy hxCAD, DM, Pneumonia, PA-Fib, Coronary angioplasty x 5or 6 with stent ,Hepatitis C Back surgery x 2, 1960's-1970's,  6 discs lower back, uses back brace prn, sleep apnea, uses c-pap, oxygen 4 liters n/c prn  Non smoker, no smokeless tobacco, no alcohol quit 30 y ago , Father MI, Mother DM  Anxiety/depression 

## 2013-11-12 ENCOUNTER — Ambulatory Visit
Admission: RE | Admit: 2013-11-12 | Discharge: 2013-11-12 | Disposition: A | Discharge status: Home / Self Care | Payer: Medicare Other | Source: Ambulatory Visit | Attending: Radiation Oncology | Admitting: Radiation Oncology

## 2013-11-12 ENCOUNTER — Encounter: Payer: Self-pay | Admitting: Radiation Oncology

## 2013-11-12 VITALS — BP 135/70 | HR 59 | Temp 98.6°F | Resp 20 | Ht 67.5 in | Wt 228.0 lb

## 2013-11-12 DIAGNOSIS — C649 Malignant neoplasm of unspecified kidney, except renal pelvis: Secondary | ICD-10-CM | POA: Diagnosis not present

## 2013-11-12 DIAGNOSIS — Z510 Encounter for antineoplastic radiation therapy: Secondary | ICD-10-CM | POA: Diagnosis present

## 2013-11-12 DIAGNOSIS — C7951 Secondary malignant neoplasm of bone: Secondary | ICD-10-CM

## 2013-11-12 DIAGNOSIS — C7952 Secondary malignant neoplasm of bone marrow: Principal | ICD-10-CM

## 2013-11-12 DIAGNOSIS — C7949 Secondary malignant neoplasm of other parts of nervous system: Secondary | ICD-10-CM

## 2013-11-12 DIAGNOSIS — C7931 Secondary malignant neoplasm of brain: Secondary | ICD-10-CM

## 2013-11-12 HISTORY — DX: Malignant neoplasm of bone and articular cartilage, unspecified: C41.9

## 2013-11-12 MED ORDER — PROMETHAZINE HCL 25 MG PO TABS
25.0000 mg | ORAL_TABLET | Freq: Four times a day (QID) | ORAL | Status: DC | PRN
Start: 2013-11-12 — End: 2014-01-13

## 2013-11-12 NOTE — Progress Notes (Signed)
Please see the Nurse Progress Note in the MD Initial Consult Encounter for this patient. 

## 2013-11-12 NOTE — Progress Notes (Signed)
Radiation Oncology         [phone]) [id number] ________________________________  Initial outpatient Consultation  Name: [name] MRN: [id number]  Date: 11/12/2013  DOB: [birth year]  QJ:JHERDEYC,XKGYJEH Jerilynn Mages, MD  Wyatt Portela, MD   REFERRING PHYSICIAN: Wyatt Portela, MD  DIAGNOSIS: The encounter diagnosis was Secondary malignant neoplasm of bone and bone marrow. 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 male who was diagnosed with renal cell carcinoma in 2009 and underwent right nephrectomy at [facility] for this.  Per the patient, he had an unremarkable course until April when he developed pain behind his left ear that, within a few days, radiated down his left neck and left arm.  He went the the ED and a CT of the C spine revealed a lytic lesion at C3 (left).  The lesion involves the left C3-4 foramen and left transverse foramen. Possible T1 lesion. MR of the C spine on 10-08-13 increased the suspicion for metastasis.  Tumor extended into Left C4 neural foramen but not the spinal canal.  Dr. Hal Neer arranged for biopsy - And CT of C and T spine was done on 5-15 to search for other lesions more amenable to biopsy.  T10 appeared grossly abnormal. This was biopsied.  Lung and mediastinal disease was also appreciated on the scan.  Specimen was positive for renal cell carcinoma.  Dr. Alen Blew has met with him and has discussed systemic therapies but wanted a multidisciplinary evaluation for his spine disease, first.    He denies any new weakness of numbness in his extremities.  He takes methadone and hydrocodone for the severe L neck/arm pain.  For 2 days he has also noticed right sided aching, throbbing pain in the side of his abdomen (approximately along the T10 dermatome.  He has constipation, but no incontinence of urine/stool or urinary retention. He is in a wheelchair today but able to walk short distances at home.  He plays  Bingo, and used to ride a motorcycle (but cannot currently due to his medical issues). Has baseline weakness and numbness in extremities from prior back surgeries and degenerative discs.  PREVIOUS RADIATION THERAPY: No  PAST MEDICAL HISTORY:  has a past medical history of Coronary artery disease; Hypertension; High cholesterol; Diabetes mellitus; LBBB (left bundle branch block); Paroxysmal atrial fibrillation; Pneumonia (10-2012); CHF (congestive heart failure); Renal disorder; Hepatitis (many yrs ago); Disc; Anemia; Sleep apnea; Cancer (2010); and Bone cancer (10/22/13).    PAST SURGICAL HISTORY: Past Surgical History  Procedure Laterality Date  . Nephrectomy Right 2010  . Esophagogastroduodenoscopy Left 10/02/2012    Dr. Paulita Fujita: normal   . Colonoscopy Left 10/03/2012    Dr. Michail Sermon: poorly prepped colon, lipomatous ileocecal valve. Path revealed focal acute inflammation with question of NSAID-induced injury.   . Back surgery  1960's and 1970's    lower x 2  . Left ring finger traumatic amputation      jumped out of truck, finger caught  . Coronary angioplasty with stent placement  yrs ago    x 5 or 6--DES TO LAD AND RAMUS    FAMILY HISTORY: family history includes Diabetes in his mother; Heart attack in his father; Hyperlipidemia in his mother. There is no history of Colon cancer, Crohn's disease, or Ulcerative colitis.  SOCIAL HISTORY:  reports that he has been passively smoking.  He has never used smokeless tobacco. He reports that he does not drink alcohol  or use illicit drugs.  ALLERGIES: Other  MEDICATIONS:  Current Outpatient Prescriptions  Medication Sig Dispense Refill  . amiodarone (PACERONE) 200 MG tablet Take 200 mg by mouth daily.      . B-D ULTRAFINE III SHORT PEN 31G X 8 MM MISC       . COMBIVENT RESPIMAT 20-100 MCG/ACT AERS respimat       . furosemide (LASIX) 40 MG tablet Take 40 mg by mouth daily.      Marland Kitchen HYDROcodone-acetaminophen (NORCO) 10-325 MG per tablet Take 1  tablet by mouth every 6 (six) hours as needed for moderate pain.       . Insulin Glargine (LANTUS) 100 UNIT/ML Solostar Pen Inject 40 Units into the skin at bedtime.      . methadone (DOLOPHINE) 10 MG tablet Take 10 mg by mouth daily as needed for moderate pain.      . metoprolol (LOPRESSOR) 50 MG tablet Take 50 mg by mouth 2 (two) times daily.       . nitroGLYCERIN (NITRODUR - DOSED IN MG/24 HR) 0.2 mg/hr patch Place 0.2 mg onto the skin daily.      . nitroGLYCERIN (NITROSTAT) 0.4 MG SL tablet Place 1 tablet (0.4 mg total) under the tongue every 5 (five) minutes as needed. Chest pain  25 tablet  5  . pantoprazole (PROTONIX) 40 MG tablet Take 40 mg by mouth daily.      . tamsulosin (FLOMAX) 0.4 MG CAPS capsule Take 0.4 mg by mouth daily.      . promethazine (PHENERGAN) 25 MG tablet Take 1 tablet (25 mg total) by mouth every 6 (six) hours as needed for nausea or vomiting.  30 tablet  5   No current facility-administered medications for this encounter.    REVIEW OF SYSTEMS:  Notable for that above.   PHYSICAL EXAM:  height is 5' 7.5" (1.715 m) and weight is 228 lb (103.42 kg). His oral temperature is 98.6 F (37 C). His blood pressure is 135/70 and his pulse is 59. His respiration is 20.   General: Alert and oriented, in no acute distress HEENT: Head is normocephalic. Pupils are constricted, equally round and reactive to light. Extraocular movements are intact. Oropharynx is clear. Neck: no palpable cervical or supraclavicular lymphadenopathy. Heart: Regular in rate and rhythm with no murmurs, rubs, or gallops. Chest: low pitched inspiratory sounds bilaterally Abdomen: Soft, nontender, nondistended, with no rigidity or guarding. Extremities: No cyanosis or edema. Lymphatics: No concerning lymphadenopathy. Skin: No concerning lesions. Musculoskeletal: symmetric strength and muscle tone throughout. No reproducible tenderness in spine or right flank / ribs/ side. Missing L 4th  digit. Neurologic: Cranial nerves II through XII are grossly intact. No obvious focalities. Speech is fluent. Coordination is intact. Psychiatric: Judgment and insight are intact. Affect is appropriate.   ECOG = 2  0 - Asymptomatic (Fully active, able to carry on all predisease activities without restriction)  1 - Symptomatic but completely ambulatory (Restricted in physically strenuous activity but ambulatory and able to carry out work of a light or sedentary nature. For example, light housework, office work)  2 - Symptomatic, <50% in bed during the day (Ambulatory and capable of all self care but unable to carry out any work activities. Up and about more than 50% of waking hours)  3 - Symptomatic, >50% in bed, but not bedbound (Capable of only limited self-care, confined to bed or chair 50% or more of waking hours)  4 - Bedbound (Completely disabled. Cannot carry on  any self-care. Totally confined to bed or chair)  5 - Death   Eustace Pen MM, Creech RH, Tormey DC, et al. [phone]). "Toxicity and response criteria of the [facility]". [place] Oncol. 5 (6): 649-55   LABORATORY DATA:  Lab Results  Component Value Date   WBC 6.3 11/10/2013   HGB 12.3* 11/10/2013   HCT 37.9* 11/10/2013   MCV 79.6 11/10/2013   PLT 248 11/10/2013   CMP     Component Value Date/Time   NA 143 11/10/2013 1039   NA 140 08/16/2013 1036   K 4.1 11/10/2013 1039   K 4.4 08/16/2013 1036   CL 98 08/16/2013 1036   CO2 32* 11/10/2013 1039   CO2 34* 08/16/2013 1036   GLUCOSE 136 11/10/2013 1039   GLUCOSE 139* 08/16/2013 1036   BUN 15.5 11/10/2013 1039   BUN 19 08/16/2013 1036   CREATININE 1.4* 11/10/2013 1039   CREATININE 1.50* 10/08/2013 1322   CALCIUM 9.1 11/10/2013 1039   CALCIUM 9.0 08/16/2013 1036   CALCIUM 8.1* 08/26/2012 1312   PROT 6.8 11/10/2013 1039   PROT 6.5 01/25/2013 0603   ALBUMIN 3.0* 11/10/2013 1039   ALBUMIN 3.0* 01/25/2013 0603   AST 13 11/10/2013 1039   AST 11 01/25/2013 0603   ALT 23 11/10/2013 1039   ALT  11 01/25/2013 0603   ALKPHOS 93 11/10/2013 1039   ALKPHOS 82 01/25/2013 0603   BILITOT 0.46 11/10/2013 1039   BILITOT 0.2* 01/25/2013 0603   GFRNONAA 43* 01/27/2013 0528   GFRAA 50* 01/27/2013 0528         RADIOGRAPHY: Ct Biopsy  10/22/2013   CLINICAL DATA:  Cervical spine and lesion. Initial planning was 4 cervical spine biopsy. Subsequent decision making resulted in a search for an additional biopsy site. The tentative plan is for scanning the chest abdomen and pelvis without contrast an subsequent safe bone biopsy.  EXAM: CT-GUIDED BIOPSY T10 VERTEBRAL BODY LESION  MEDICATIONS AND MEDICAL HISTORY: Versed 6 mg, Fentanyl 150 mcg.  Additional Medications: None.  ANESTHESIA/SEDATION: Moderate sedation time: 20 minutes  PROCEDURE: The procedure, risks, benefits, and alternatives were explained to the patient. Questions regarding the procedure were encouraged and answered. The patient understands and consents to the procedure.  Scanning through the chest abdomen and pelvis was performed.  The back was prepped with Betadine in a sterile fashion, and a sterile drape was applied covering the operative field. A sterile gown and sterile gloves were used for the procedure.  Under CT guidance, a(n) 17 gauge guide needle was advanced into the T10 vertebral body leak. Subsequently four 18 gauge core biopsies were obtained. The guide needle was removed. Final imaging was performed.  Patient tolerated the procedure well without complication. Vital sign monitoring by nursing staff during the procedure will continue as patient is in the special procedures unit for post procedure observation.  FINDINGS: Scanning through the chest abdomen pelvis without contrast was performed.  Abnormal mediastinal adenopathy. 3.3 cm right peritracheal lymph node. Other smaller nodes.  Bilateral pulmonary nodules. Largest on the left is in the left lower lobe measuring 16 mm on image 39. Largest on the right by this noncontrast exam is in the  right upper lobe measuring 8 mm on image 21.  Destructive T10 bone lesion with suspected epidural spread of tumor. Cord compression cannot be excluded. The patient was ambulatory and denied weakness in his lower extremities.  Right nephrectomy.  Cholelithiasis.  The images document guide needle placement within the T10  vertebral body lesion. Post biopsy images demonstrate no hemorrhage.  IMPRESSION: Successful CT-guided core biopsy of the T10 vertebral body lesion.  Imaging through the chest abdomen and pelvis was performed demonstrating abnormal mediastinal adenopathy, pulmonary nodules, a T10 vertebral body lesion, all worrisome for metastatic disease. Dedicated CT chest abdomen pelvis with contrast is recommended in the outpatient setting to further characterize.   Electronically Signed   By: Maryclare Bean M.D.   On: 10/22/2013 12:26      IMPRESSION/PLAN:This is a lovely 73 year old man with relapsed renal cell carcinoma about 6 years after initial diagnoses - now metastatic. He understands that his disease is not curable, but our goal is to palliate his symptoms and gain control of it with systemic therapy.  I have put him as an add-on for the next brain and spine tumor board this Mon, 11-15-13. I left a massage for Dr Hal Neer as well so we can discuss him after conference.  His last note from May will be faxed to me soon, as well.  We will determine the best order of treatment (RT --> surgery) v (surgery --> RT) v Radiation alone.  Also we will discuss fractionation options at conference [facility] vs conventional RT over 2 weeks).  I will call the patient's friend, Butch Penny, at [phone] per his request, after conference.  Tentatively, I put him on our schedule for simulation next week.   He is pleased with this plan. Consent for spinal radiation has been signed and placed in his chart.    __________________________________________   Eppie Gibson, MD

## 2013-11-15 ENCOUNTER — Other Ambulatory Visit: Payer: Self-pay | Admitting: Radiation Therapy

## 2013-11-15 DIAGNOSIS — C7931 Secondary malignant neoplasm of brain: Secondary | ICD-10-CM

## 2013-11-15 DIAGNOSIS — C7949 Secondary malignant neoplasm of other parts of nervous system: Principal | ICD-10-CM

## 2013-11-17 ENCOUNTER — Telehealth: Payer: Self-pay | Admitting: Oncology

## 2013-11-17 NOTE — Telephone Encounter (Signed)
Called [name] and [name] for more information regarding their request for pain medication.  [name] said the pain medication he is taking now does not help with the pain he is having in his neck and right lower back.  He said he is taking norco 10/325 mg twice a day and methadone 10 mg once a day.  Informed [name] that per his script in epic, he can take the norco every 6 hours as needed for pain.  [name] said he was going to try taking the norco q 6 hours to see if it will help with the pain.  He will call us back if it does not help with the pain.

## 2013-11-18 ENCOUNTER — Other Ambulatory Visit: Payer: Medicare Other

## 2013-11-18 ENCOUNTER — Ambulatory Visit (HOSPITAL_COMMUNITY)
Admission: RE | Admit: 2013-11-18 | Discharge: 2013-11-18 | Disposition: A | Discharge status: Home / Self Care | Payer: Medicare Other | Source: Ambulatory Visit | Attending: Radiation Oncology | Admitting: Radiation Oncology

## 2013-11-18 ENCOUNTER — Other Ambulatory Visit: Payer: Self-pay | Admitting: Radiation Oncology

## 2013-11-18 DIAGNOSIS — C7949 Secondary malignant neoplasm of other parts of nervous system: Secondary | ICD-10-CM | POA: Diagnosis present

## 2013-11-18 DIAGNOSIS — C7931 Secondary malignant neoplasm of brain: Secondary | ICD-10-CM | POA: Diagnosis not present

## 2013-11-18 DIAGNOSIS — C649 Malignant neoplasm of unspecified kidney, except renal pelvis: Secondary | ICD-10-CM | POA: Diagnosis not present

## 2013-11-18 MED ORDER — GADOBENATE DIMEGLUMINE 529 MG/ML IV SOLN
20.0000 mL | Freq: Once | INTRAVENOUS | Status: AC | PRN
  Administered 2013-11-18: 20 mL via INTRAVENOUS

## 2013-11-19 ENCOUNTER — Ambulatory Visit: Admission: RE | Admit: 2013-11-19 | Payer: Medicare Other | Source: Ambulatory Visit | Admitting: Radiation Oncology

## 2013-11-19 ENCOUNTER — Other Ambulatory Visit: Payer: Self-pay | Admitting: Radiation Oncology

## 2013-11-19 ENCOUNTER — Encounter: Payer: Self-pay | Admitting: Radiation Therapy

## 2013-11-19 DIAGNOSIS — C7931 Secondary malignant neoplasm of brain: Secondary | ICD-10-CM

## 2013-11-19 DIAGNOSIS — C7949 Secondary malignant neoplasm of other parts of nervous system: Principal | ICD-10-CM

## 2013-11-19 MED ORDER — DEXAMETHASONE 4 MG PO TABS: 4.0000 mg | ORAL_TABLET | Freq: Two times a day (BID) | ORAL | Status: DC

## 2013-11-19 NOTE — Progress Notes (Signed)
I called to let Mr. Pandya know that Dr. Isidore Moos did speak with Dr. Hal Neer earlier today. I let them know  Dr. Sande Rives office will be in touch with him either later this evening or Monday about his pending surgery. I spoke with his friend, Butch Penny. Butch Penny said that he is doing fine. He has started the steroid Rx and hopes it will help with his pain. She was happy to hear from me and knows to call if she needs anything.   Mont Dutton R.T. (R) (T) Radiation Special Procedures [place] [phone] Office [phone] Pager [phone] Fax [email or web address]

## 2013-11-22 ENCOUNTER — Other Ambulatory Visit: Payer: Self-pay | Admitting: Neurosurgery

## 2013-11-22 ENCOUNTER — Encounter: Payer: Self-pay | Admitting: *Deleted

## 2013-11-22 NOTE — Progress Notes (Signed)
[place] Psychosocial Distress Screening Clinical Social Work  Clinical Social Work was referred by distress screening protocol.  The patient scored a 6 on the Psychosocial Distress Thermometer which indicates moderate distress. Clinical Social Worker attempted to contact patient by phone to assess for distress and other psychosocial needs.   ONCBCN DISTRESS SCREENING 11/12/2013  Screening Type Initial Screening  Elta [place] the number that describes how much distress you have been experiencing in the past week 6  Spiritual/Religous concerns type Facing my mortality  Physical Problem type Pain;Nausea/vomiting;Loss of appetitie;Constipation/diarrhea  Other please call pt    Clinical Social Worker follow up needed: yes  If yes, follow up plan: CSW left voicemail for patient to return call when convenient.  CSW will await patient return call.   Polo Riley, MSW, LCSW, OSW-C Clinical Social Worker [facility] [phone]

## 2013-11-23 ENCOUNTER — Ambulatory Visit (HOSPITAL_COMMUNITY): Payer: Medicare Other

## 2013-11-23 ENCOUNTER — Telehealth: Payer: Self-pay | Admitting: Oncology

## 2013-11-23 ENCOUNTER — Other Ambulatory Visit: Payer: Self-pay | Admitting: Neurosurgery

## 2013-11-23 ENCOUNTER — Other Ambulatory Visit (HOSPITAL_COMMUNITY): Payer: Self-pay | Admitting: Neurosurgery

## 2013-11-23 ENCOUNTER — Other Ambulatory Visit: Payer: Self-pay | Admitting: Radiation Therapy

## 2013-11-23 DIAGNOSIS — R229 Localized swelling, mass and lump, unspecified: Secondary | ICD-10-CM

## 2013-11-23 DIAGNOSIS — C7931 Secondary malignant neoplasm of brain: Secondary | ICD-10-CM

## 2013-11-23 DIAGNOSIS — C7949 Secondary malignant neoplasm of other parts of nervous system: Principal | ICD-10-CM

## 2013-11-23 NOTE — Telephone Encounter (Signed)
Dr. Dwyane Dee office called to r/s appt due to pt preop...done they will contact pt with appt

## 2013-11-24 ENCOUNTER — Encounter (HOSPITAL_COMMUNITY): Payer: Medicare Other

## 2013-11-25 ENCOUNTER — Encounter (HOSPITAL_COMMUNITY)
Admission: RE | Admit: 2013-11-25 | Discharge: 2013-11-25 | Disposition: A | Discharge status: Home / Self Care | Payer: Medicare Other | Source: Ambulatory Visit | Attending: Neurosurgery | Admitting: Neurosurgery

## 2013-11-25 ENCOUNTER — Ambulatory Visit: Payer: Medicare Other | Admitting: Oncology

## 2013-11-25 ENCOUNTER — Encounter (HOSPITAL_COMMUNITY): Payer: Self-pay | Admitting: Pharmacy Technician

## 2013-11-25 ENCOUNTER — Encounter (HOSPITAL_COMMUNITY): Payer: Self-pay

## 2013-11-25 DIAGNOSIS — C7931 Secondary malignant neoplasm of brain: Secondary | ICD-10-CM | POA: Diagnosis not present

## 2013-11-25 DIAGNOSIS — I4891 Unspecified atrial fibrillation: Secondary | ICD-10-CM | POA: Diagnosis not present

## 2013-11-25 DIAGNOSIS — C7949 Secondary malignant neoplasm of other parts of nervous system: Secondary | ICD-10-CM

## 2013-11-25 HISTORY — DX: Other complications of anesthesia, initial encounter: T88.59XA

## 2013-11-25 HISTORY — DX: Adverse effect of unspecified anesthetic, initial encounter: T41.45XA

## 2013-11-25 LAB — COMPREHENSIVE METABOLIC PANEL
ALT: 28 U/L (ref 0–53)
AST: 14 U/L (ref 0–37)
Albumin: 3.1 g/dL — ABNORMAL LOW (ref 3.5–5.2)
Alkaline Phosphatase: 82 U/L (ref 39–117)
BUN: 28 mg/dL — ABNORMAL HIGH (ref 6–23)
CO2: 32 meq/L (ref 19–32)
Calcium: 9 mg/dL (ref 8.4–10.5)
Chloride: 91 mEq/L — ABNORMAL LOW (ref 96–112)
Creatinine, Ser: 1.28 mg/dL (ref 0.50–1.35)
GFR calc non Af Amer: 55 mL/min — ABNORMAL LOW (ref >90)
GFR, EST AFRICAN AMERICAN: 64 mL/min — AB (ref >90)
Glucose, Bld: 231 mg/dL — ABNORMAL HIGH (ref 70–99)
POTASSIUM: 4.1 meq/L (ref 3.7–5.3)
SODIUM: 136 meq/L — AB (ref 137–147)
Total Bilirubin: 0.4 mg/dL (ref 0.3–1.2)
Total Protein: 6.8 g/dL (ref 6.0–8.3)

## 2013-11-25 LAB — SURGICAL PCR SCREEN
MRSA, PCR: NEGATIVE
STAPHYLOCOCCUS AUREUS: NEGATIVE

## 2013-11-25 LAB — CBC
HCT: 39.1 % (ref 39.0–52.0)
Hemoglobin: 12.9 g/dL — ABNORMAL LOW (ref 13.0–17.0)
MCH: 25.7 pg — AB (ref 26.0–34.0)
MCHC: 33 g/dL (ref 30.0–36.0)
MCV: 78 fL (ref 78.0–100.0)
Platelets: 219 10*3/uL (ref 150–400)
RBC: 5.01 MIL/uL (ref 4.22–5.81)
RDW: 14.2 % (ref 11.5–15.5)
WBC: 7.2 10*3/uL (ref 4.0–10.5)

## 2013-11-25 LAB — ABO/RH: ABO/RH(D): A POS

## 2013-11-25 NOTE — Pre-Procedure Instructions (Signed)
ALEJO BEAMER  11/25/2013   Your procedure is scheduled on: Monday, November 29, 2013  Report to [facility] (use Main Entrance "A'') at 10:00 AM.  Call this number if you have problems the morning of surgery: [phone]   Remember:   Do not eat food or drink liquids after midnight.   Take these medicines the morning of surgery with A SIP OF WATER: amiodarone (PACERONE)  metoprolol (LOPRESSOR), nitroGLYCERIN patch, pantoprazole (PROTONIX), tamsulosin (FLOMAX) ,  COMBIVENT , if needed: pain medication, nitroGLYCERIN (NITROSTAT) for chest pain, promethazine (PHENERGAN) for nausea or vomiting.  Stop taking Aspirin, vitamins and herbal medications. Do not take any NSAIDs ie: Ibuprofen, Advil, Naproxen or any medication containing Aspirin; stop now.   Do not wear jewelry, make-up or nail polish.  Do not wear lotions, powders, or perfumes. You may wear deodorant.  Do not shave 48 hours prior to surgery. Men may shave face and neck.  Do not bring valuables to the hospital.  [facility] is not responsible for any belongings or valuables.               Contacts, dentures or bridgework may not be worn into surgery.  Leave suitcase in the car. After surgery it may be brought to your room.  For patients admitted to the hospital, discharge time is determined by your  treatment team.               Patients discharged the day of surgery will not be allowed to drive home.  Name and phone number of your driver:   Special Instructions:  Special Instructions:Special Instructions: [facility] - Preparing for Surgery  Before surgery, you can play an important role.  Because skin is not sterile, your skin needs to be as free of germs as possible.  You can reduce the number of germs on you skin by washing with CHG (chlorahexidine gluconate) soap before surgery.  CHG is an antiseptic cleaner which kills germs and bonds with the skin to continue killing germs even after washing.  Please DO NOT use if  you have an allergy to CHG or antibacterial soaps.  If your skin becomes reddened/irritated stop using the CHG and inform your nurse when you arrive at Short Stay.  Do not shave (including legs and underarms) for at least 48 hours prior to the first CHG shower.  You may shave your face.  Please follow these instructions carefully:   1.  Shower with CHG Soap the night before surgery and the morning of Surgery.  2.  If you choose to wash your hair, wash your hair first as usual with your normal shampoo.  3.  After you shampoo, rinse your hair and body thoroughly to remove the Shampoo.  4.  Use CHG as you would any other liquid soap.  You can apply chg directly  to the skin and wash gently with scrungie or a clean washcloth.  5.  Apply the CHG Soap to your body ONLY FROM THE NECK DOWN.  Do not use on open wounds or open sores.  Avoid contact with your eyes, ears, mouth and genitals (private parts).  Wash genitals (private parts) with your normal soap.  6.  Wash thoroughly, paying special attention to the area where your surgery will be performed.  7.  Thoroughly rinse your body with warm water from the neck down.  8.  DO NOT shower/wash with your normal soap after using and rinsing off the CHG Soap.  9.  Pat yourself dry with a clean towel.            10.  Wear clean pajamas.            11.  Place clean sheets on your bed the night of your first shower and do not sleep with pets.  Day of Surgery  Do not apply any lotions the morning of surgery.  Please wear clean clothes to the hospital/surgery center.   Please read over the following fact sheets that you were given: Pain Booklet, Coughing and Deep Breathing, Blood Transfusion Information, MRSA Information and Surgical Site Infection Prevention

## 2013-11-26 ENCOUNTER — Ambulatory Visit (HOSPITAL_COMMUNITY)
Admission: RE | Admit: 2013-11-26 | Discharge: 2013-11-26 | Disposition: A | Discharge status: Home / Self Care | Payer: Medicare Other | Source: Ambulatory Visit | Attending: Orthopedic Surgery | Admitting: Orthopedic Surgery

## 2013-11-26 DIAGNOSIS — Z01810 Encounter for preprocedural cardiovascular examination: Secondary | ICD-10-CM

## 2013-11-26 DIAGNOSIS — C7949 Secondary malignant neoplasm of other parts of nervous system: Secondary | ICD-10-CM

## 2013-11-26 DIAGNOSIS — I4891 Unspecified atrial fibrillation: Secondary | ICD-10-CM

## 2013-11-26 DIAGNOSIS — C7931 Secondary malignant neoplasm of brain: Secondary | ICD-10-CM

## 2013-11-26 NOTE — Progress Notes (Signed)
Bilateral lower extremity venous duplex:  No evidence of DVT, superficial thrombosis, or Baker's Cyst.   

## 2013-11-28 MED ORDER — CEFAZOLIN SODIUM-DEXTROSE 2-3 GM-% IV SOLR
2.0000 g | INTRAVENOUS | Status: AC
  Administered 2013-11-29: 2 g via INTRAVENOUS
  Filled 2013-11-28: qty 50

## 2013-11-29 ENCOUNTER — Inpatient Hospital Stay (HOSPITAL_COMMUNITY): Payer: Medicare Other | Admitting: Anesthesiology

## 2013-11-29 ENCOUNTER — Encounter (HOSPITAL_COMMUNITY)
Admission: RE | Disposition: A | Discharge status: Home / Self Care | Payer: Self-pay | Source: Ambulatory Visit | Attending: Neurosurgery

## 2013-11-29 ENCOUNTER — Inpatient Hospital Stay (HOSPITAL_COMMUNITY)
Admission: RE | Admit: 2013-11-29 | Discharge: 2013-12-05 | DRG: 456 | Disposition: A | Discharge status: Home / Self Care | Payer: Medicare Other | Source: Ambulatory Visit | Attending: Neurosurgery | Admitting: Neurosurgery

## 2013-11-29 ENCOUNTER — Ambulatory Visit (HOSPITAL_COMMUNITY)
Admission: RE | Admit: 2013-11-29 | Discharge: 2013-11-29 | Disposition: A | Discharge status: Home / Self Care | Payer: Medicare Other | Source: Ambulatory Visit | Attending: Neurosurgery | Admitting: Neurosurgery

## 2013-11-29 ENCOUNTER — Encounter (HOSPITAL_COMMUNITY): Payer: Medicare Other | Admitting: Anesthesiology

## 2013-11-29 ENCOUNTER — Encounter (HOSPITAL_COMMUNITY): Payer: Self-pay | Admitting: Anesthesiology

## 2013-11-29 DIAGNOSIS — I10 Essential (primary) hypertension: Secondary | ICD-10-CM | POA: Diagnosis present

## 2013-11-29 DIAGNOSIS — J9600 Acute respiratory failure, unspecified whether with hypoxia or hypercapnia: Secondary | ICD-10-CM | POA: Diagnosis not present

## 2013-11-29 DIAGNOSIS — M4804 Spinal stenosis, thoracic region: Secondary | ICD-10-CM | POA: Diagnosis present

## 2013-11-29 DIAGNOSIS — G4733 Obstructive sleep apnea (adult) (pediatric): Secondary | ICD-10-CM | POA: Diagnosis present

## 2013-11-29 DIAGNOSIS — Z85528 Personal history of other malignant neoplasm of kidney: Secondary | ICD-10-CM

## 2013-11-29 DIAGNOSIS — I2510 Atherosclerotic heart disease of native coronary artery without angina pectoris: Secondary | ICD-10-CM | POA: Diagnosis present

## 2013-11-29 DIAGNOSIS — E7800 Pure hypercholesterolemia, unspecified: Secondary | ICD-10-CM | POA: Diagnosis present

## 2013-11-29 DIAGNOSIS — Z905 Acquired absence of kidney: Secondary | ICD-10-CM

## 2013-11-29 DIAGNOSIS — E119 Type 2 diabetes mellitus without complications: Secondary | ICD-10-CM | POA: Diagnosis present

## 2013-11-29 DIAGNOSIS — R229 Localized swelling, mass and lump, unspecified: Secondary | ICD-10-CM

## 2013-11-29 DIAGNOSIS — C799 Secondary malignant neoplasm of unspecified site: Secondary | ICD-10-CM

## 2013-11-29 DIAGNOSIS — C7951 Secondary malignant neoplasm of bone: Secondary | ICD-10-CM

## 2013-11-29 DIAGNOSIS — I509 Heart failure, unspecified: Secondary | ICD-10-CM | POA: Diagnosis present

## 2013-11-29 DIAGNOSIS — Z79899 Other long term (current) drug therapy: Secondary | ICD-10-CM

## 2013-11-29 DIAGNOSIS — Z794 Long term (current) use of insulin: Secondary | ICD-10-CM

## 2013-11-29 DIAGNOSIS — IMO0001 Reserved for inherently not codable concepts without codable children: Secondary | ICD-10-CM

## 2013-11-29 DIAGNOSIS — C7952 Secondary malignant neoplasm of bone marrow: Principal | ICD-10-CM

## 2013-11-29 HISTORY — PX: RADIOLOGY WITH ANESTHESIA: SHX6223

## 2013-11-29 LAB — GLUCOSE, CAPILLARY
GLUCOSE-CAPILLARY: 143 mg/dL — AB (ref 70–99)
GLUCOSE-CAPILLARY: 134 mg/dL — AB (ref 70–99)
Glucose-Capillary: 223 mg/dL — ABNORMAL HIGH (ref 70–99)
Glucose-Capillary: 169 mg/dL — ABNORMAL HIGH (ref 70–99)

## 2013-11-29 LAB — MRSA PCR SCREENING: MRSA BY PCR: NEGATIVE

## 2013-11-29 LAB — PROTIME-INR
INR: 0.94 (ref 0.00–1.49)
Prothrombin Time: 12.4 seconds (ref 11.6–15.2)

## 2013-11-29 LAB — APTT: aPTT: 23 seconds — ABNORMAL LOW (ref 24–37)

## 2013-11-29 SURGERY — RADIOLOGY WITH ANESTHESIA
Anesthesia: Monitor Anesthesia Care

## 2013-11-29 MED ORDER — SODIUM CHLORIDE 0.9 % IJ SOLN: 3.0000 mL | INTRAMUSCULAR | Status: DC | PRN

## 2013-11-29 MED ORDER — NITROGLYCERIN 0.2 MG/HR TD PT24
0.2000 mg | MEDICATED_PATCH | Freq: Every day | TRANSDERMAL | Status: DC
  Administered 2013-12-01 – 2013-12-05 (×5): 0.2 mg via TRANSDERMAL
  Filled 2013-11-29 (×6): qty 1

## 2013-11-29 MED ORDER — SODIUM CHLORIDE 0.9 % IV SOLN
INTRAVENOUS | Status: DC
  Administered 2013-11-30 – 2013-12-01 (×3): via INTRAVENOUS

## 2013-11-29 MED ORDER — HYDROMORPHONE HCL PF 1 MG/ML IJ SOLN: 0.2500 mg | INTRAMUSCULAR | Status: DC | PRN

## 2013-11-29 MED ORDER — NEOSTIGMINE METHYLSULFATE 10 MG/10ML IV SOLN
INTRAVENOUS | Status: DC | PRN
  Administered 2013-11-29: 4 mg via INTRAVENOUS

## 2013-11-29 MED ORDER — ROCURONIUM BROMIDE 100 MG/10ML IV SOLN
INTRAVENOUS | Status: DC | PRN
  Administered 2013-11-29: 50 mg via INTRAVENOUS

## 2013-11-29 MED ORDER — SODIUM CHLORIDE 0.9 % IV SOLN: 250.0000 mL | INTRAVENOUS | Status: DC

## 2013-11-29 MED ORDER — DOCUSATE SODIUM 100 MG PO CAPS
100.0000 mg | ORAL_CAPSULE | Freq: Two times a day (BID) | ORAL | Status: DC
  Administered 2013-11-29 – 2013-12-05 (×11): 100 mg via ORAL
  Filled 2013-11-29 (×13): qty 1

## 2013-11-29 MED ORDER — ONDANSETRON HCL 4 MG/2ML IJ SOLN: 4.0000 mg | INTRAMUSCULAR | Status: DC | PRN

## 2013-11-29 MED ORDER — SODIUM CHLORIDE 0.9 % IJ SOLN: 3.0000 mL | Freq: Two times a day (BID) | INTRAMUSCULAR | Status: DC

## 2013-11-29 MED ORDER — INSULIN GLARGINE 100 UNIT/ML ~~LOC~~ SOLN
40.0000 [IU] | Freq: Every day | SUBCUTANEOUS | Status: DC
  Administered 2013-11-29 – 2013-12-04 (×6): 40 [IU] via SUBCUTANEOUS
  Filled 2013-11-29 (×7): qty 0.4

## 2013-11-29 MED ORDER — ONDANSETRON HCL 4 MG/2ML IJ SOLN
4.0000 mg | Freq: Once | INTRAMUSCULAR | Status: DC | PRN
Start: 2013-11-29 — End: 2013-11-29

## 2013-11-29 MED ORDER — INSULIN ASPART 100 UNIT/ML ~~LOC~~ SOLN
0.0000 [IU] | Freq: Three times a day (TID) | SUBCUTANEOUS | Status: DC
Start: 2013-11-30 — End: 2013-12-01
  Administered 2013-12-01: 4 [IU] via SUBCUTANEOUS

## 2013-11-29 MED ORDER — CLONIDINE HCL 0.1 MG PO TABS
0.1000 mg | ORAL_TABLET | Freq: Once | ORAL | Status: AC | PRN
  Filled 2013-11-29: qty 1

## 2013-11-29 MED ORDER — CEFAZOLIN SODIUM-DEXTROSE 2-3 GM-% IV SOLR: 2.0000 g | INTRAVENOUS | Status: DC

## 2013-11-29 MED ORDER — GLYCOPYRROLATE 0.2 MG/ML IJ SOLN
INTRAMUSCULAR | Status: DC | PRN
  Administered 2013-11-29: .8 mg via INTRAVENOUS

## 2013-11-29 MED ORDER — AMIODARONE HCL 200 MG PO TABS
200.0000 mg | ORAL_TABLET | Freq: Every day | ORAL | Status: DC
  Administered 2013-12-01 – 2013-12-05 (×5): 200 mg via ORAL
  Filled 2013-11-29 (×6): qty 1

## 2013-11-29 MED ORDER — MORPHINE SULFATE 2 MG/ML IJ SOLN
1.0000 mg | INTRAMUSCULAR | Status: DC | PRN
  Administered 2013-11-30: 4 mg via INTRAVENOUS
  Filled 2013-11-29: qty 2
  Filled 2013-11-29: qty 1
  Filled 2013-11-29: qty 2

## 2013-11-29 MED ORDER — FUROSEMIDE 40 MG PO TABS
40.0000 mg | ORAL_TABLET | Freq: Every day | ORAL | Status: DC
  Administered 2013-11-29 – 2013-12-05 (×6): 40 mg via ORAL
  Filled 2013-11-29 (×7): qty 1

## 2013-11-29 MED ORDER — PROMETHAZINE HCL 25 MG PO TABS: 25.0000 mg | ORAL_TABLET | Freq: Four times a day (QID) | ORAL | Status: DC | PRN

## 2013-11-29 MED ORDER — LACTATED RINGERS IV SOLN
INTRAVENOUS | Status: DC
  Administered 2013-11-29: 10:00:00 via INTRAVENOUS

## 2013-11-29 MED ORDER — METOPROLOL TARTRATE 50 MG PO TABS
50.0000 mg | ORAL_TABLET | Freq: Two times a day (BID) | ORAL | Status: DC
  Administered 2013-11-29 – 2013-12-05 (×10): 50 mg via ORAL
  Filled 2013-11-29 (×13): qty 1

## 2013-11-29 MED ORDER — PROPOFOL INFUSION 10 MG/ML OPTIME
INTRAVENOUS | Status: DC | PRN
  Administered 2013-11-29: 50 ug/kg/min via INTRAVENOUS

## 2013-11-29 MED ORDER — IPRATROPIUM-ALBUTEROL 20-100 MCG/ACT IN AERS: 1.0000 | INHALATION_SPRAY | Freq: Four times a day (QID) | RESPIRATORY_TRACT | Status: DC | PRN

## 2013-11-29 MED ORDER — METHADONE HCL 10 MG PO TABS: 10.0000 mg | ORAL_TABLET | Freq: Every evening | ORAL | Status: DC | PRN

## 2013-11-29 MED ORDER — HEPARIN SODIUM (PORCINE) 1000 UNIT/ML IJ SOLN
INTRAMUSCULAR | Status: DC | PRN
  Administered 2013-11-29: 2000 [IU] via INTRAVENOUS
  Administered 2013-11-29: 1000 [IU] via INTRAVENOUS

## 2013-11-29 MED ORDER — SENNA 8.6 MG PO TABS
1.0000 | ORAL_TABLET | Freq: Two times a day (BID) | ORAL | Status: DC
  Administered 2013-11-29 – 2013-12-05 (×11): 8.6 mg via ORAL
  Filled 2013-11-29 (×13): qty 1

## 2013-11-29 MED ORDER — LIDOCAINE HCL (CARDIAC) 20 MG/ML IV SOLN
INTRAVENOUS | Status: DC | PRN
  Administered 2013-11-29: 75 mg via INTRAVENOUS

## 2013-11-29 MED ORDER — HYDROCODONE-ACETAMINOPHEN 10-325 MG PO TABS
1.0000 | ORAL_TABLET | Freq: Four times a day (QID) | ORAL | Status: DC | PRN
  Administered 2013-11-30 – 2013-12-03 (×4): 1 via ORAL
  Filled 2013-11-29 (×4): qty 1

## 2013-11-29 MED ORDER — PANTOPRAZOLE SODIUM 40 MG PO TBEC
40.0000 mg | DELAYED_RELEASE_TABLET | Freq: Every day | ORAL | Status: DC
  Administered 2013-12-01 – 2013-12-05 (×5): 40 mg via ORAL
  Filled 2013-11-29 (×5): qty 1

## 2013-11-29 MED ORDER — IPRATROPIUM-ALBUTEROL 0.5-2.5 (3) MG/3ML IN SOLN: 3.0000 mL | Freq: Four times a day (QID) | RESPIRATORY_TRACT | Status: DC | PRN

## 2013-11-29 MED ORDER — TAMSULOSIN HCL 0.4 MG PO CAPS
0.4000 mg | ORAL_CAPSULE | Freq: Every day | ORAL | Status: DC
  Administered 2013-12-01 – 2013-12-05 (×5): 0.4 mg via ORAL
  Filled 2013-11-29 (×6): qty 1

## 2013-11-29 MED ORDER — IOHEXOL 300 MG/ML  SOLN
150.0000 mL | Freq: Once | INTRAMUSCULAR | Status: AC | PRN
  Administered 2013-11-29: 1 mL via INTRA_ARTERIAL

## 2013-11-29 MED ORDER — DEXAMETHASONE 4 MG PO TABS
4.0000 mg | ORAL_TABLET | Freq: Two times a day (BID) | ORAL | Status: DC
  Administered 2013-11-30 – 2013-12-01 (×3): 4 mg via ORAL
  Filled 2013-11-29 (×4): qty 1

## 2013-11-29 MED ORDER — PROPOFOL 10 MG/ML IV BOLUS
INTRAVENOUS | Status: DC | PRN
  Administered 2013-11-29: 200 mg via INTRAVENOUS

## 2013-11-29 MED ORDER — ONDANSETRON HCL 4 MG/2ML IJ SOLN: 4.0000 mg | Freq: Once | INTRAMUSCULAR | Status: DC | PRN

## 2013-11-29 MED ORDER — LACTATED RINGERS IV SOLN
INTRAVENOUS | Status: DC | PRN
  Administered 2013-11-29 (×2): via INTRAVENOUS

## 2013-11-29 MED ORDER — FENTANYL CITRATE 0.05 MG/ML IJ SOLN
INTRAMUSCULAR | Status: DC | PRN
  Administered 2013-11-29: 50 ug via INTRAVENOUS

## 2013-11-29 MED ORDER — NITROGLYCERIN 0.4 MG SL SUBL: 0.4000 mg | SUBLINGUAL_TABLET | SUBLINGUAL | Status: DC | PRN

## 2013-11-29 NOTE — Progress Notes (Signed)
Spoke with Dr. Saintclair Halsted regarding BP goals after IR. Order for clonidine 0.1 to be given if SBP greater than 170. Also told okay to place order for CPAP.

## 2013-11-29 NOTE — H&P (Signed)
History of Present Illness:  1. neck pain  [name] is a 73 year old man initialy seen in Bobby office, previously seen by Dr. Hal Neer. He has a history of renal cell carcinoma and has previously undergone nephrectomy. He more recently has been complaining of 6-8 weeks of left-sided anterior neck and clavicle pain, as well as pain in Bobby middle of his back radiating around Bobby right flank. Because of this pain, imaging studies were done which demonstrated bony lesions at C3 and T10.   Upon questioning, Bobby patient states that Bobby pain in his neck and in his back and flank persisted for a week or 2, however he heard of a alternative remedy including baking soda and water, which he drank and Bobby next day his pain is better. At this time, he does have pain in his lower back primarily after he is up walking for any length of time. This is a chronic problem with his which she has had for many decades. He does not have any significant numbness, tingling, or weakness of Bobby arms, nor does he have any new weakness in Bobby legs. He does have baseline numbness in Bobby legs again due to his chronic back problems.    PAST MEDICAL HISTORY, SURGICAL HISTORY, FAMILY HISTORY, SOCIAL HISTORY AND REVIEW OF SYSTEMS  I have reviewed Bobby patient's past medical, surgical, family and social history as well as Bobby comprehensive review of systems as included on Bobby [facility] history form dated 10/06/2013, which I have signed.  Family History:  Reviewed, no changes.  Social History:  Reviewed, no changes.  MEDICATIONS(added, continued or stopped this visit):    Started Medication Directions Instruction Stopped   amiodarone 200 mg tablet take 1 tablet by oral route every day     dexamethasone 4 mg tablet take 1 tablet by oral route 2 times every day with meals     diltiazem ER 360 mg capsule,extended release take 1 capsule by oral route every day     furosemide 40 mg tablet take 1 tablet by  oral route every day     Lantus Solostar 100 unit/mL (3 mL) subcutaneous insulin pen inject 40unitsby subcutaneous route as per insulin protocol evening    11/03/2013 Lortab 10 mg-325 mg tablet take 1 tablet by oral route every 4 - 6 hours as needed for pain     metoprolol tartrate 50 mg tablet take 1 tablet by oral route 2 times every day with meals     pantoprazole 40 mg tablet,delayed release take 1 tablet by oral route every day     promethazine 25 mg tablet take 1 tablet by oral route every 6 hours as needed     tamsulosin ER 0.4 mg capsule,extended release 24 hr take 1 capsule by oral route every day 1/2 hour following Bobby same meal each day     ALLERGIES:  Ingredient Reaction Medication Name Comment  NO KNOWN ALLERGIES     No known allergies.  Vitals  Date Temp F BP Pulse Ht In Wt Lb BMI BSA Pain Score  11/25/2013  132/73 58 67 227 35.55  3/10   PHYSICAL EXAM  General  General Appearance: normal  Mood/Affect: normal  Orientation: normal  Pulses/Edema: 2+ bilateral radial / DP pulses  Gait/Station: antalgic, normal heel and toe walking, normal tandem gait  Coordination: normal  Skin  Right Upper Extremity: normal  Left Upper Extremity: normal  Right Lower Extremity: normal  Left Lower Extremity: normal  Inspection/Palpation  Right Left  Lumbar Spine: normal normal  Upper Extremity: normal normal  Lower Extremity: normal normal  Stability  Cervical Spine: normal  Right Upper Extremity: normal  Left Upper Extremity: normal  Right Lower Extremity: normal  Left Lower Extremity: normal  Range of Motion  Cervical Spine: normal  Right Upper Extremity: normal  Left Upper Extremity: normal  Right Lower Extremity: normal  Left Lower Extremity: normal  Motor Strength  Upper and lower extremity motor strength was tested in Bobby clinically pertinent muscles. Any abnormal findings will be noted below.  Right Left  Deltoid: normal normal  Biceps: normal normal  Triceps:  normal normal  Infraspinatus: normal normal  Wrist Extensor: normal normal  Grip: normal normal  Hip Flexor: normal normal  Knee Extensor: normal normal  Tib Anterior: normal normal  EHL: normal normal  Medial Gastroc: normal normal  Sensory  Sensation was tested at C5 to T1 and L2 to S1 .Any abnormal findings will be noted below..  Right Left  C5: normal normal  C6: normal normal  C7: normal normal  C8: normal normal  T1: normal normal  Median Hand: normal normal  Ulnar Hand: normal normal  L2: normal normal  L3: normal normal  L4: normal normal  L5: normal normal  S1: normal normal  Motor and other Tests  Right Left  Hoffman's: absent absent  Clonus: absent absent  Babinski: downgoing downgoing  Muscle Stretch Reflexes  Upper and lower extremity reflexes were tested in Bobby clinically pertinent muscles. Any abnormal findings will be noted below.  Right Left  Bicep: normal normal  Tricep: normal normal  Brachioradialis: normal normal  Patellar: normal normal  Achilles: normal normal  Additional Findings:  No midline thoracic/cervical /lumbar pain to palp   DIAGNOSTIC RESULTS  MRI of Bobby thoracic and cervical spine was reviewed. These demonstrate infiltration of Bobby C3 vertebral body as well as Bobby left sided posterior elements at C3. There is some encroachment into Bobby spinal canal, however there is no significant stenosis or spinal cord compression at this level.  MRI of Bobby thoracic spine demonstrates bony infiltration of Bobby T10 vertebral body including bilateral pedicles and portions of Bobby lamina and transverse processes. There is infiltration into Bobby spinal canal with moderate spinal stenosis at this level. 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 Bobby C3 lesion does not significantly compromise Bobby spinal canal and can likely be treated with radiation therapy without  separation surgery. This level does not appear to be unstable. A T10 lesion does cause fairly significant spinal stenosis and therefore will likely require separation surgery. Given Bobby degree of bony infiltration, instrumentation at this level would be indicated.  - Presenting for preop embolization of T10 lesion  PLAN - Proceed with spinal angio, T10 embolization - ICU postop  Bobby MRI findings were reviewed with [name]. I reviewed with them Bobby treatment plan for both lesions. Bobby rationale for separation surgery at Bobby thoracic level, as well as Bobby need for preoperative angiogram and possible embolization were reviewed. Bobby risks of both Bobby embolization procedure as well as a surgical decompression and stabilization were reviewed. These include but are not limited to spinal cord injury or stroke leading to weakness, paralysis, and bowel or bladder dysfunction, CSF leak, significant intraoperative bleeding, infection, groin hematoma, contrast reaction, and nephropathy. [name] understood our discussion and are  willing to proceed with Bobby plan above. All their questions were answered.

## 2013-11-29 NOTE — Transfer of Care (Signed)
Immediate Anesthesia Transfer of Care Note  Patient: [name]  Procedure(s) Performed: Procedure(s): RADIOLOGY WITH ANESTHESIA (N/A)  Patient Location: PACU  Anesthesia Type:General  Level of Consciousness: awake, alert  and oriented  Airway & Oxygen Therapy: Patient Spontanous Breathing and Patient connected to nasal cannula oxygen  Post-op Assessment: Report given to PACU RN and Post -op Vital signs reviewed and stable  Post vital signs: Reviewed and stable  Complications: No apparent anesthesia complications

## 2013-11-29 NOTE — Anesthesia Preprocedure Evaluation (Addendum)
Anesthesia Evaluation  Patient identified by MRN, date of birth, ID band Patient awake    Reviewed: Allergy & Precautions, H&P , NPO status , Patient's Chart, lab work & pertinent test results  Airway Mallampati: II TM Distance: >3 FB Neck ROM: Full    Dental  (+) Dental Advisory Given, Chipped, Poor Dentition,    Pulmonary sleep apnea , COPD         Cardiovascular hypertension, + CAD and +CHF + dysrhythmias Atrial Fibrillation     Neuro/Psych    GI/Hepatic (+) Hepatitis -  Endo/Other  diabetes, Type 2, Insulin Dependent, Oral Hypoglycemic Agents  Renal/GU Renal InsufficiencyRenal disease     Musculoskeletal   Abdominal   Peds  Hematology  (+) anemia ,   Anesthesia Other Findings   Reproductive/Obstetrics                          Anesthesia Physical Anesthesia Plan  ASA: III  Anesthesia Plan: MAC and General   Post-op Pain Management:    Induction: Intravenous  Airway Management Planned: Oral ETT and Mask  Additional Equipment:   Intra-op Plan:   Post-operative Plan: Extubation in OR  Informed Consent: I have reviewed the patients History and Physical, chart, labs and discussed the procedure including the risks, benefits and alternatives for the proposed anesthesia with the patient or authorized representative who has indicated his/her understanding and acceptance.     Plan Discussed with:   Anesthesia Plan Comments:         Anesthesia Quick Evaluation

## 2013-11-29 NOTE — Progress Notes (Signed)
Dr.Smith notified of pt eating cup of jello and 4oz water at 0600

## 2013-11-29 NOTE — Anesthesia Postprocedure Evaluation (Signed)
  Anesthesia Post-op Note  Patient: [name]  Procedure(s) Performed: Procedure(s): RADIOLOGY WITH ANESTHESIA (N/A)  Patient Location: PACU  Anesthesia Type:General  Level of Consciousness: awake, alert  and oriented  Airway and Oxygen Therapy: Patient Spontanous Breathing and Patient connected to nasal cannula oxygen  Post-op Pain: mild  Post-op Assessment: Post-op Vital signs reviewed, Patient's Cardiovascular Status Stable, Respiratory Function Stable, Patent Airway and Pain level controlled  Post-op Vital Signs: stable  Last Vitals:  Filed Vitals:   11/29/13 1810  BP:   Pulse: 72  Temp:   Resp: 11    Complications: No apparent anesthesia complications

## 2013-11-29 NOTE — Progress Notes (Signed)
Pt placed on CPAP with home FFM and tubing. Pt was set on Auto mode min 6 max 20 cmH2O. Pt is comfortable and vitals are stable. RT will continue to monitor.

## 2013-11-29 NOTE — Brief Op Note (Signed)
PREOP DX: T10 tumor  POSTOP DX: Same  PROCEDURE: Diagnostic spinal angiogram, embolization of bilateral T10 segmental arteries  SURGEON: Dr. Consuella Lose, MD  ANESTHESIA: GETA  EBL: Minimal  SPECIMENS: None  COMPLICATIONS: None  CONDITION: Stable to recovery  FINDINGS: 1. Tumor fed by small vessels arising directly from bilateral T10 segmental vessels, successfully embolized with Onyx-18. 2. Major contribution to the ASA is seen from the left T7 segmental

## 2013-11-29 NOTE — Anesthesia Procedure Notes (Signed)
Procedure Name: Intubation Date/Time: 11/29/2013 1:36 PM Performed by: Eligha Bridegroom Pre-anesthesia Checklist: Patient identified, Patient being monitored, Emergency Drugs available, Timeout performed and Suction available Patient Re-evaluated:Patient Re-evaluated prior to inductionOxygen Delivery Method: Circle system utilized Preoxygenation: Pre-oxygenation with 100% oxygen Intubation Type: IV induction Ventilation: Mask ventilation without difficulty Laryngoscope Size: Mac and 4 Grade View: Grade III Tube type: Oral Tube size: 7.5 mm Number of attempts: 2 Airway Equipment and Method: Bougie stylet Placement Confirmation: ETT inserted through vocal cords under direct vision,  positive ETCO2 and breath sounds checked- equal and bilateral Secured at: 22 cm Tube secured with: Tape Dental Injury: Teeth and Oropharynx as per pre-operative assessment  Difficulty Due To: Difficulty was unanticipated, Difficult Airway- due to dentition and Difficult Airway- due to anterior larynx

## 2013-11-30 ENCOUNTER — Encounter (HOSPITAL_COMMUNITY)
Admission: RE | Disposition: A | Discharge status: Home / Self Care | Payer: Self-pay | Source: Ambulatory Visit | Attending: Neurosurgery

## 2013-11-30 ENCOUNTER — Inpatient Hospital Stay (HOSPITAL_COMMUNITY): Payer: Medicare Other | Admitting: Anesthesiology

## 2013-11-30 ENCOUNTER — Encounter (HOSPITAL_COMMUNITY): Payer: Medicare Other | Admitting: Anesthesiology

## 2013-11-30 ENCOUNTER — Inpatient Hospital Stay (HOSPITAL_COMMUNITY): Payer: Medicare Other

## 2013-11-30 ENCOUNTER — Other Ambulatory Visit: Payer: Self-pay | Admitting: Radiation Therapy

## 2013-11-30 ENCOUNTER — Inpatient Hospital Stay (HOSPITAL_COMMUNITY): Admission: RE | Admit: 2013-11-30 | Payer: Medicare Other | Source: Ambulatory Visit | Admitting: Neurosurgery

## 2013-11-30 ENCOUNTER — Encounter (HOSPITAL_COMMUNITY): Payer: Self-pay | Admitting: Neurosurgery

## 2013-11-30 DIAGNOSIS — C7949 Secondary malignant neoplasm of other parts of nervous system: Principal | ICD-10-CM

## 2013-11-30 DIAGNOSIS — G4733 Obstructive sleep apnea (adult) (pediatric): Secondary | ICD-10-CM

## 2013-11-30 DIAGNOSIS — C7931 Secondary malignant neoplasm of brain: Secondary | ICD-10-CM

## 2013-11-30 DIAGNOSIS — J9600 Acute respiratory failure, unspecified whether with hypoxia or hypercapnia: Secondary | ICD-10-CM

## 2013-11-30 HISTORY — PX: POSTERIOR LUMBAR FUSION 4 LEVEL: SHX6037

## 2013-11-30 LAB — BASIC METABOLIC PANEL
BUN: 23 mg/dL (ref 6–23)
CALCIUM: 8.1 mg/dL — AB (ref 8.4–10.5)
CHLORIDE: 97 meq/L (ref 96–112)
CO2: 27 mEq/L (ref 19–32)
Creatinine, Ser: 1.27 mg/dL (ref 0.50–1.35)
GFR calc Af Amer: 64 mL/min — ABNORMAL LOW (ref >90)
GFR calc non Af Amer: 56 mL/min — ABNORMAL LOW (ref >90)
GLUCOSE: 139 mg/dL — AB (ref 70–99)
POTASSIUM: 4.8 meq/L (ref 3.7–5.3)
SODIUM: 137 meq/L (ref 137–147)

## 2013-11-30 LAB — GLUCOSE, CAPILLARY
GLUCOSE-CAPILLARY: 86 mg/dL (ref 70–99)
GLUCOSE-CAPILLARY: 116 mg/dL — AB (ref 70–99)
GLUCOSE-CAPILLARY: 184 mg/dL — AB (ref 70–99)

## 2013-11-30 LAB — PHOSPHORUS: PHOSPHORUS: 5.4 mg/dL — AB (ref 2.3–4.6)

## 2013-11-30 LAB — CBC
HEMATOCRIT: 36.7 % — AB (ref 39.0–52.0)
HEMOGLOBIN: 11.8 g/dL — AB (ref 13.0–17.0)
MCH: 25 pg — AB (ref 26.0–34.0)
MCHC: 32.2 g/dL (ref 30.0–36.0)
MCV: 77.8 fL — AB (ref 78.0–100.0)
PLATELETS: 172 10*3/uL (ref 150–400)
RBC: 4.72 MIL/uL (ref 4.22–5.81)
RDW: 14.5 % (ref 11.5–15.5)
WBC: 12.5 10*3/uL — AB (ref 4.0–10.5)

## 2013-11-30 LAB — PREPARE RBC (CROSSMATCH)

## 2013-11-30 LAB — MAGNESIUM: Magnesium: 2.1 mg/dL (ref 1.5–2.5)

## 2013-11-30 SURGERY — POSTERIOR LUMBAR FUSION 4 LEVEL
Anesthesia: General | Site: Spine Thoracic

## 2013-11-30 MED ORDER — ROCURONIUM BROMIDE 50 MG/5ML IV SOLN
INTRAVENOUS | Status: AC
  Filled 2013-11-30: qty 1

## 2013-11-30 MED ORDER — PHENYLEPHRINE 40 MCG/ML (10ML) SYRINGE FOR IV PUSH (FOR BLOOD PRESSURE SUPPORT)
PREFILLED_SYRINGE | INTRAVENOUS | Status: AC
  Filled 2013-11-30: qty 10

## 2013-11-30 MED ORDER — DEXMEDETOMIDINE HCL IN NACL 400 MCG/100ML IV SOLN
0.4000 ug/kg/h | INTRAVENOUS | Status: DC
  Administered 2013-11-30: 0.4 ug/kg/h via INTRAVENOUS
  Filled 2013-11-30 (×2): qty 100

## 2013-11-30 MED ORDER — 0.9 % SODIUM CHLORIDE (POUR BTL) OPTIME
TOPICAL | Status: DC | PRN
  Administered 2013-11-30 (×2): 1000 mL

## 2013-11-30 MED ORDER — FENTANYL CITRATE 0.05 MG/ML IJ SOLN
INTRAMUSCULAR | Status: AC
  Filled 2013-11-30: qty 5

## 2013-11-30 MED ORDER — THROMBIN 5000 UNITS EX SOLR
OROMUCOSAL | Status: DC | PRN
  Administered 2013-11-30 (×2): via TOPICAL

## 2013-11-30 MED ORDER — DEXAMETHASONE SODIUM PHOSPHATE 10 MG/ML IJ SOLN
INTRAMUSCULAR | Status: AC
  Filled 2013-11-30: qty 1

## 2013-11-30 MED ORDER — SODIUM CHLORIDE 0.9 % IJ SOLN
INTRAMUSCULAR | Status: AC
  Filled 2013-11-30: qty 10

## 2013-11-30 MED ORDER — FENTANYL CITRATE 0.05 MG/ML IJ SOLN
INTRAMUSCULAR | Status: DC | PRN
  Administered 2013-11-30 (×2): 50 ug via INTRAVENOUS
  Administered 2013-11-30 (×4): 100 ug via INTRAVENOUS
  Administered 2013-11-30 (×2): 50 ug via INTRAVENOUS
  Administered 2013-11-30: 150 ug via INTRAVENOUS

## 2013-11-30 MED ORDER — ONDANSETRON HCL 4 MG/2ML IJ SOLN
INTRAMUSCULAR | Status: AC
  Filled 2013-11-30: qty 2

## 2013-11-30 MED ORDER — PROPOFOL 10 MG/ML IV BOLUS
INTRAVENOUS | Status: DC | PRN
  Administered 2013-11-30: 150 mg via INTRAVENOUS

## 2013-11-30 MED ORDER — MIDAZOLAM HCL 2 MG/2ML IJ SOLN
INTRAMUSCULAR | Status: AC
  Filled 2013-11-30: qty 2

## 2013-11-30 MED ORDER — ROCURONIUM BROMIDE 100 MG/10ML IV SOLN
INTRAVENOUS | Status: DC | PRN
  Administered 2013-11-30: 60 mg via INTRAVENOUS

## 2013-11-30 MED ORDER — THROMBIN 20000 UNITS EX SOLR
CUTANEOUS | Status: DC | PRN
  Administered 2013-11-30: 11:00:00 via TOPICAL

## 2013-11-30 MED ORDER — GLYCOPYRROLATE 0.2 MG/ML IJ SOLN
INTRAMUSCULAR | Status: AC
  Filled 2013-11-30: qty 2

## 2013-11-30 MED ORDER — GLYCOPYRROLATE 0.2 MG/ML IJ SOLN
INTRAMUSCULAR | Status: DC | PRN
  Administered 2013-11-30: 0.4 mg via INTRAVENOUS

## 2013-11-30 MED ORDER — SODIUM CHLORIDE 0.9 % IR SOLN
Status: DC | PRN
  Administered 2013-11-30: 11:00:00

## 2013-11-30 MED ORDER — NEOSTIGMINE METHYLSULFATE 10 MG/10ML IV SOLN
INTRAVENOUS | Status: DC | PRN
  Administered 2013-11-30: 3 mg via INTRAVENOUS

## 2013-11-30 MED ORDER — MIDAZOLAM HCL 5 MG/5ML IJ SOLN
INTRAMUSCULAR | Status: DC | PRN
  Administered 2013-11-30: 2 mg via INTRAVENOUS

## 2013-11-30 MED ORDER — ALBUMIN HUMAN 5 % IV SOLN
INTRAVENOUS | Status: DC | PRN
  Administered 2013-11-30 (×2): via INTRAVENOUS

## 2013-11-30 MED ORDER — PHENYLEPHRINE HCL 10 MG/ML IJ SOLN
10.0000 mg | INTRAVENOUS | Status: DC | PRN
  Administered 2013-11-30: 25 ug/min via INTRAVENOUS

## 2013-11-30 MED ORDER — CEFAZOLIN SODIUM-DEXTROSE 2-3 GM-% IV SOLR
INTRAVENOUS | Status: DC | PRN
  Administered 2013-11-30 (×2): 2 g via INTRAVENOUS

## 2013-11-30 MED ORDER — SUCCINYLCHOLINE CHLORIDE 20 MG/ML IJ SOLN
INTRAMUSCULAR | Status: DC | PRN
  Administered 2013-11-30: 100 mg via INTRAVENOUS

## 2013-11-30 MED ORDER — SUCCINYLCHOLINE CHLORIDE 20 MG/ML IJ SOLN
INTRAMUSCULAR | Status: AC
  Filled 2013-11-30: qty 1

## 2013-11-30 MED ORDER — MORPHINE SULFATE 2 MG/ML IJ SOLN
2.0000 mg | INTRAMUSCULAR | Status: DC | PRN
  Administered 2013-11-30: 2 mg via INTRAVENOUS
  Administered 2013-11-30 (×2): 4 mg via INTRAVENOUS
  Administered 2013-12-01: 2 mg via INTRAVENOUS
  Filled 2013-11-30: qty 2
  Filled 2013-11-30: qty 1

## 2013-11-30 MED ORDER — DEXAMETHASONE SODIUM PHOSPHATE 10 MG/ML IJ SOLN
INTRAMUSCULAR | Status: DC | PRN
  Administered 2013-11-30: 10 mg via INTRAVENOUS

## 2013-11-30 MED ORDER — LIDOCAINE HCL (CARDIAC) 20 MG/ML IV SOLN
INTRAVENOUS | Status: AC
  Filled 2013-11-30: qty 5

## 2013-11-30 MED ORDER — BUPIVACAINE HCL (PF) 0.5 % IJ SOLN
INTRAMUSCULAR | Status: DC | PRN
  Administered 2013-11-30: 3.5 mL

## 2013-11-30 MED ORDER — EPHEDRINE SULFATE 50 MG/ML IJ SOLN
INTRAMUSCULAR | Status: AC
  Filled 2013-11-30: qty 1

## 2013-11-30 MED ORDER — LIDOCAINE-EPINEPHRINE 1 %-1:100000 IJ SOLN
INTRAMUSCULAR | Status: DC | PRN
  Administered 2013-11-30: 3.5 mL

## 2013-11-30 MED ORDER — PROPOFOL 10 MG/ML IV BOLUS
INTRAVENOUS | Status: AC
  Filled 2013-11-30: qty 20

## 2013-11-30 MED ORDER — LIDOCAINE HCL (CARDIAC) 20 MG/ML IV SOLN
INTRAVENOUS | Status: DC | PRN
  Administered 2013-11-30: 100 mg via INTRAVENOUS

## 2013-11-30 MED ORDER — NEOSTIGMINE METHYLSULFATE 10 MG/10ML IV SOLN
INTRAVENOUS | Status: AC
  Filled 2013-11-30: qty 1

## 2013-11-30 MED ORDER — LACTATED RINGERS IV SOLN
INTRAVENOUS | Status: DC | PRN
  Administered 2013-11-30 (×2): via INTRAVENOUS

## 2013-11-30 MED ORDER — ONDANSETRON HCL 4 MG/2ML IJ SOLN
INTRAMUSCULAR | Status: DC | PRN
  Administered 2013-11-30: 4 mg via INTRAVENOUS

## 2013-11-30 SURGICAL SUPPLY — 74 items
BAG DECANTER FOR FLEXI CONT (MISCELLANEOUS) ×3 IMPLANT
BENZOIN TINCTURE PRP APPL 2/3 (GAUZE/BANDAGES/DRESSINGS) IMPLANT
BLADE 10 SAFETY STRL DISP (BLADE) IMPLANT
BLADE SURG 11 STRL SS (BLADE) ×3 IMPLANT
BLADE SURG ROTATE 9660 (MISCELLANEOUS) ×3 IMPLANT
BUR MATCHSTICK NEURO 3.0 LAGG (BURR) ×3 IMPLANT
CANISTER SUCT 3000ML (MISCELLANEOUS) ×3 IMPLANT
CATH FOLEY 2WAY SLVR  5CC 14FR (CATHETERS)
CATH FOLEY 2WAY SLVR 5CC 14FR (CATHETERS) IMPLANT
CLOSURE WOUND 1/2 X4 (GAUZE/BANDAGES/DRESSINGS)
CONT SPEC 4OZ CLIKSEAL STRL BL (MISCELLANEOUS) ×9 IMPLANT
COVER BACK TABLE 24X17X13 BIG (DRAPES) IMPLANT
COVER TABLE BACK 60X90 (DRAPES) ×3 IMPLANT
DECANTER SPIKE VIAL GLASS SM (MISCELLANEOUS) ×3 IMPLANT
DRAPE C-ARM 42X72 X-RAY (DRAPES) ×3 IMPLANT
DRAPE C-ARMOR (DRAPES) ×3 IMPLANT
DRAPE LAPAROTOMY 100X72X124 (DRAPES) ×3 IMPLANT
DRAPE MICROSCOPE LEICA (MISCELLANEOUS) IMPLANT
DRAPE MICROSCOPE ZEISS OPMI (DRAPES) ×3 IMPLANT
DRAPE POUCH INSTRU U-SHP 10X18 (DRAPES) ×3 IMPLANT
DRAPE SURG 17X23 STRL (DRAPES) ×3 IMPLANT
DURAPREP 26ML APPLICATOR (WOUND CARE) ×3 IMPLANT
ELECT REM PT RETURN 9FT ADLT (ELECTROSURGICAL) ×3
ELECTRODE REM PT RTRN 9FT ADLT (ELECTROSURGICAL) ×1 IMPLANT
GAUZE SPONGE 4X4 16PLY XRAY LF (GAUZE/BANDAGES/DRESSINGS) ×6 IMPLANT
GLOVE BIOGEL PI IND STRL 7.0 (GLOVE) ×4 IMPLANT
GLOVE BIOGEL PI IND STRL 7.5 (GLOVE) ×4 IMPLANT
GLOVE BIOGEL PI INDICATOR 7.0 (GLOVE) ×8
GLOVE BIOGEL PI INDICATOR 7.5 (GLOVE) ×8
GLOVE ECLIPSE 7.0 STRL STRAW (GLOVE) ×6 IMPLANT
GLOVE ECLIPSE 8.5 STRL (GLOVE) ×3 IMPLANT
GLOVE EXAM NITRILE LRG STRL (GLOVE) IMPLANT
GLOVE EXAM NITRILE MD LF STRL (GLOVE) IMPLANT
GLOVE EXAM NITRILE XL STR (GLOVE) IMPLANT
GLOVE EXAM NITRILE XS STR PU (GLOVE) IMPLANT
GLOVE SURG SS PI 7.0 STRL IVOR (GLOVE) ×15 IMPLANT
GOWN STRL REUS W/ TWL LRG LVL3 (GOWN DISPOSABLE) ×2 IMPLANT
GOWN STRL REUS W/ TWL XL LVL3 (GOWN DISPOSABLE) ×5 IMPLANT
GOWN STRL REUS W/TWL 2XL LVL3 (GOWN DISPOSABLE) IMPLANT
GOWN STRL REUS W/TWL LRG LVL3 (GOWN DISPOSABLE) ×4
GOWN STRL REUS W/TWL XL LVL3 (GOWN DISPOSABLE) ×10
HEMOSTAT POWDER KIT SURGIFOAM (HEMOSTASIS) ×6 IMPLANT
K-WIRE NITHNOL TROCAR TIP (WIRE) ×24 IMPLANT
KIT BASIN OR (CUSTOM PROCEDURE TRAY) ×3 IMPLANT
KIT ROOM TURNOVER OR (KITS) ×3 IMPLANT
NEEDLE HYPO 18GX1.5 BLUNT FILL (NEEDLE) IMPLANT
NEEDLE HYPO 25X1 1.5 SAFETY (NEEDLE) ×3 IMPLANT
NEEDLE SPNL 18GX3.5 QUINCKE PK (NEEDLE) ×3 IMPLANT
NEEDLE TARGETING (NEEDLE) ×24 IMPLANT
NS IRRIG 1000ML POUR BTL (IV SOLUTION) ×3 IMPLANT
PACK LAMINECTOMY NEURO (CUSTOM PROCEDURE TRAY) ×3 IMPLANT
PAD ARMBOARD 7.5X6 YLW CONV (MISCELLANEOUS) ×18 IMPLANT
PEDICLE ACCESS TOOL SHEATH ×2 IMPLANT
ROD PERC PREBENT 160MM (Rod) ×6 IMPLANT
RUBBERBAND STERILE (MISCELLANEOUS) ×6 IMPLANT
SCREW MIN INVASIVE 6.5X45 (Screw) ×12 IMPLANT
SCREW PATHFINDER MIS 5.5X45 (Screw) ×12 IMPLANT
SEALER BIPOLAR AQUA 5.0 SHEATH (INSTRUMENTS) ×3 IMPLANT
SHEATH PAT (SHEATH) ×3 IMPLANT
SPONGE GAUZE 4X4 12PLY (GAUZE/BANDAGES/DRESSINGS) IMPLANT
SPONGE LAP 4X18 X RAY DECT (DISPOSABLE) IMPLANT
SPONGE NEURO XRAY DETECT 1X3 (DISPOSABLE) ×3 IMPLANT
SPONGE SURGIFOAM ABS GEL 100 (HEMOSTASIS) ×3 IMPLANT
STRIP CLOSURE SKIN 1/2X4 (GAUZE/BANDAGES/DRESSINGS) IMPLANT
SUT VIC AB 0 CT1 18XCR BRD8 (SUTURE) ×6 IMPLANT
SUT VIC AB 0 CT1 8-18 (SUTURE) ×12
SUT VIC AB 2-0 CT1 18 (SUTURE) ×6 IMPLANT
SUT VICRYL 3-0 RB1 18 ABS (SUTURE) ×12 IMPLANT
SYR 20ML ECCENTRIC (SYRINGE) ×3 IMPLANT
SYR 3ML LL SCALE MARK (SYRINGE) IMPLANT
TOP CLSR SEQUOIA (Orthopedic Implant) ×24 IMPLANT
TOWEL OR 17X24 6PK STRL BLUE (TOWEL DISPOSABLE) ×3 IMPLANT
TOWEL OR 17X26 10 PK STRL BLUE (TOWEL DISPOSABLE) ×3 IMPLANT
WATER STERILE IRR 1000ML POUR (IV SOLUTION) ×3 IMPLANT

## 2013-11-30 NOTE — Procedures (Signed)
Extubation Procedure Note  Patient Details:   Name: [name] DOB: Jul 07, 1940 MRN: [id number]   Airway Documentation:   Pt extubated to 4L [place]. No stridor noted. BBS equal and dim. Pt able to vocalize.  Evaluation  O2 sats: stable throughout and currently acceptable Complications: No apparent complications Patient did tolerate procedure well. Bilateral Breath Sounds: Rhonchi Suctioning: Airway;Oral   Lissa Merlin 11/30/2013, 4:02 PM

## 2013-11-30 NOTE — Progress Notes (Signed)
UR completed.  Naraly Fritcher, RN BSN [facility] CCM Trauma/Neuro ICU Case Manager [phone]  

## 2013-11-30 NOTE — Transfer of Care (Signed)
Immediate Anesthesia Transfer of Care Note  Patient: [name]  Procedure(s) Performed: Procedure(s): THORACIC TEN LAMINECTOMY DECOMPRESSION WITH THORACIC EIGHT-THORACIC TWELVE INSTRUMENTATION (N/A)  Patient Location: NICU  Anesthesia Type:General  Level of Consciousness: sedated and unresponsive  Airway & Oxygen Therapy: Patient remains intubated per anesthesia plan and Patient placed on Ventilator (see vital sign flow sheet for setting)  Post-op Assessment: Report given to PACU RN and Post -op Vital signs reviewed and stable  Post vital signs: Reviewed and stable  Complications: No apparent anesthesia complications

## 2013-11-30 NOTE — Progress Notes (Signed)
Pt seen and examined. No issues overnight. Pt denies any c/o this am.  EXAM: Temp:  [96.3 F (35.7 C)-98.1 F (36.7 C)] 98.1 F (36.7 C) (06/23 0700) Pulse Rate:  [57-80] 57 (06/23 0600) Resp:  [10-20] 13 (06/23 0600) BP: (82-188)/(61-97) 135/61 mmHg (06/23 0600) SpO2:  [93 %-99 %] 95 % (06/23 0600) Weight:  [100.5 kg (221 lb 9 oz)-101.606 kg (224 lb)] 100.5 kg (221 lb 9 oz) (06/22 2000) Intake/Output     06/22 0701 - 06/23 0700 06/23 0701 - 06/24 0700   I.V. (mL/kg) 2000 (19.9)    Total Intake(mL/kg) 2000 (19.9)    Urine (mL/kg/hr) 4700    Total Output 4700     Net -2700           Awake, alert, oriented Speech fluent Good strength BLE Groin c/d/i  LABS: Lab Results  Component Value Date   CREATININE 1.28 11/25/2013   BUN 28* 11/25/2013   NA 136* 11/25/2013   K 4.1 11/25/2013   CL 91* 11/25/2013   CO2 32 11/25/2013   Lab Results  Component Value Date   WBC 7.2 11/25/2013   HGB 12.9* 11/25/2013   HCT 39.1 11/25/2013   MCV 78.0 11/25/2013   PLT 219 11/25/2013    IMPRESSION: - 73 y.o. male s/p preop embolization of bilateral T10 pedicles for renal cell met - Neurologically at baseline  PLAN: - OR this am for decompression/stabilization

## 2013-11-30 NOTE — Anesthesia Preprocedure Evaluation (Addendum)
Anesthesia Evaluation  Patient identified by MRN, date of birth, ID band Patient awake    Reviewed: Allergy & Precautions, H&P , NPO status , Patient's Chart, lab work & pertinent test results  Airway Mallampati: II TM Distance: >3 FB Neck ROM: Full    Dental  (+) Dental Advisory Given, Chipped, Poor Dentition,    Pulmonary sleep apnea , COPD breath sounds clear to auscultation        Cardiovascular hypertension, + CAD and +CHF + dysrhythmias Atrial Fibrillation Rhythm:Regular Rate:Normal     Neuro/Psych Thoracic metastaic tumor    GI/Hepatic (+) Hepatitis -  Endo/Other  diabetes, Type 2, Insulin Dependent, Oral Hypoglycemic Agents  Renal/GU      Musculoskeletal   Abdominal   Peds  Hematology  (+) anemia ,   Anesthesia Other Findings   Reproductive/Obstetrics                          Anesthesia Physical Anesthesia Plan  ASA: IV  Anesthesia Plan: General   Post-op Pain Management:    Induction: Intravenous  Airway Management Planned: Oral ETT  Additional Equipment: Arterial line  Intra-op Plan:   Post-operative Plan: Possible Post-op intubation/ventilation  Informed Consent: I have reviewed the patients History and Physical, chart, labs and discussed the procedure including the risks, benefits and alternatives for the proposed anesthesia with the patient or authorized representative who has indicated his/her understanding and acceptance.   Dental advisory given  Plan Discussed with: CRNA and Surgeon  Anesthesia Plan Comments:        Anesthesia Quick Evaluation

## 2013-11-30 NOTE — Progress Notes (Signed)
Bedside report given to [place], pt taken to periop.

## 2013-11-30 NOTE — Anesthesia Postprocedure Evaluation (Signed)
  Anesthesia Post-op Note  Patient: [name]  Procedure(s) Performed: Procedure(s): THORACIC TEN LAMINECTOMY DECOMPRESSION WITH THORACIC EIGHT-THORACIC TWELVE INSTRUMENTATION (N/A)  Patient Location: NICU  Anesthesia Type:General  Level of Consciousness: sedated and Patient remains intubated per anesthesia plan  Airway and Oxygen Therapy: Patient remains intubated per anesthesia plan and Patient placed on Ventilator (see vital sign flow sheet for setting)  Post-op Pain: none  Post-op Assessment: Post-op Vital signs reviewed, Patient's Cardiovascular Status Stable and Respiratory Function Stable  Post-op Vital Signs: stable  Last Vitals:  Filed Vitals:   11/30/13 1527  BP: 157/62  Pulse:   Temp:   Resp:     Complications: No apparent anesthesia complications

## 2013-11-30 NOTE — Consult Note (Signed)
PULMONARY / CRITICAL CARE MEDICINE   Name: [name] MRN: [id number] DOB: 12/16/1940    ADMISSION DATE:  December 09, 2013 CONSULTATION DATE:  09-Dec-2013  REFERRING MD :  Dr. Kathyrn Sheriff PRIMARY SERVICE: Neurosurgery  CHIEF COMPLAINT:  VDRF  BRIEF PATIENT DESCRIPTION: 73 year old male with history of renal cell carcinoma and sleep apnea who presents to the ICU post op thoracic ten laminectomy decompression with thoracic eight-thoracic twelve instrumentation.  Anesthesia did not feel comfortable extubating the patient in the PACU and sent patient to the ICU.  SIGNIFICANT EVENTS / STUDIES:    LINES / TUBES: ETT 11/2020-07-02  CULTURES: None  ANTIBIOTICS: None  PAST MEDICAL HISTORY :  Past Medical History  Diagnosis Date  . Coronary artery disease   . Hypertension   . High cholesterol   . Diabetes mellitus   . LBBB (left bundle branch block)   . Paroxysmal atrial fibrillation   . Pneumonia 10-2012  . CHF (congestive heart failure)   . Renal disorder     kidney cancer  . Hepatitis many yrs ago    no current liver problems per pt. reportedly Hepatitis C  . Disc     6 discs loswer back, back brace prn  . Anemia   . Cancer 2010    right nephrectomy  . 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 Complication of anesthesia     years ago went into a coma  . Sleep apnea     cpap setting of 4, or uses oxygen 4 liters per [place]   Past Surgical History  Procedure Laterality Date  . Nephrectomy Right 2010  . Esophagogastroduodenoscopy Left 10/02/2012    Dr. Paulita Fujita: normal   . Colonoscopy Left 10/03/2012    Dr. Michail Sermon: poorly prepped colon, lipomatous ileocecal valve. Path revealed focal acute inflammation with question of NSAID-induced injury.   . Back surgery  1960's and 1970's    lower x 2  . Left ring finger traumatic amputation      jumped out of truck, finger caught  . Coronary angioplasty with stent placement  yrs ago    x 5 or 6--DES TO LAD AND RAMUS  . Radiology with  anesthesia N/A 12/09/2013    Procedure: RADIOLOGY WITH ANESTHESIA;  Surgeon: Consuella Lose, MD;  Location: [facility];  Service: Radiology;  Laterality: N/A;   Prior to Admission medications   Medication Sig Start Date End Date Taking? Authorizing Provider  amiodarone (PACERONE) 200 MG tablet Take 200 mg by mouth daily.   Yes Historical Provider, MD  B-D ULTRAFINE III SHORT PEN 31G X 8 MM MISC  11/02/13  Yes Historical Provider, MD  COMBIVENT RESPIMAT 20-100 MCG/ACT AERS respimat Inhale 1 puff into the lungs every 6 (six) hours as needed for shortness of breath.  08/09/13  Yes Historical Provider, MD  dexamethasone (DECADRON) 4 MG tablet Take 1 tablet (4 mg total) by mouth 2 (two) times daily with a meal. Check glucose as this medication can elevate it. 11/19/13  Yes Eppie Gibson, MD  furosemide (LASIX) 40 MG tablet Take 40 mg by mouth daily.   Yes Historical Provider, MD  HYDROcodone-acetaminophen (NORCO) 10-325 MG per tablet Take 1 tablet by mouth every 6 (six) hours as needed for moderate pain.  07/04/13  Yes Historical Provider, MD  Insulin Glargine (LANTUS) 100 UNIT/ML Solostar Pen Inject 40 Units into the skin at bedtime. 01/27/13  Yes Maricela Curet, MD  methadone (DOLOPHINE) 10 MG tablet Take 10 mg by  mouth at bedtime as needed for moderate pain.    Yes Historical Provider, MD  metoprolol (LOPRESSOR) 50 MG tablet Take 50 mg by mouth 2 (two) times daily.    Yes Historical Provider, MD  nitroGLYCERIN (NITRODUR - DOSED IN MG/24 HR) 0.2 mg/hr patch Place 0.2 mg onto the skin daily. 08/16/13  Yes Jettie Booze, MD  pantoprazole (PROTONIX) 40 MG tablet Take 40 mg by mouth daily.   Yes Historical Provider, MD  promethazine (PHENERGAN) 25 MG tablet Take 1 tablet (25 mg total) by mouth every 6 (six) hours as needed for nausea or vomiting. 11/12/13  Yes Eppie Gibson, MD  tamsulosin (FLOMAX) 0.4 MG CAPS capsule Take 0.4 mg by mouth daily.   Yes Historical Provider, MD  nitroGLYCERIN (NITROSTAT) 0.4 MG  SL tablet Place 1 tablet (0.4 mg total) under the tongue every 5 (five) minutes as needed. Chest pain 08/16/13   Jettie Booze, MD   Allergies  Allergen Reactions  . Other Other (See Comments)    Anesthesia (gas) medication, Reaction: went into coma in the 1960's    FAMILY HISTORY:  Family History  Problem Relation Age of Onset  . Hyperlipidemia Mother   . Diabetes Mother   . Heart attack Father   . Colon cancer Neg Hx   . Crohn's disease Neg Hx   . Ulcerative colitis Neg Hx    SOCIAL HISTORY:  reports that he has been passively smoking.  He has never used smokeless tobacco. He reports that he does not drink alcohol or use illicit drugs.  REVIEW OF SYSTEMS:  Patient sedated and intubated.  SUBJECTIVE: None  VITAL SIGNS: Temp:  [96.3 F (35.7 C)-98.1 F (36.7 C)] 98.1 F (36.7 C) (06/23 0700) Pulse Rate:  [57-80] 57 (06/23 0600) Resp:  [10-19] 13 (06/23 0600) BP: (82-174)/(61-97) 157/62 mmHg (06/23 1527) SpO2:  [93 %-100 %] 100 % (06/23 1527) FiO2 (%):  [50 %] 50 % (06/23 1528) Weight:  [221 lb 9 oz (100.5 kg)] 221 lb 9 oz (100.5 kg) (06/22 2000) HEMODYNAMICS:   VENTILATOR SETTINGS: Vent Mode:  [-] Stand-by FiO2 (%):  [50 %] 50 % Set Rate:  [16 bmp] 16 bmp Vt Set:  [530 mL] 530 mL PEEP:  [5 cmH20] 5 cmH20 Plateau Pressure:  [18 cmH20] 18 cmH20 INTAKE / OUTPUT: Intake/Output     06/22 0701 - 06/23 0700 06/23 0701 - 06/24 0700   I.V. (mL/kg) 2000 (19.9) 2300 (22.9)   IV Piggyback  500   Total Intake(mL/kg) 2000 (19.9) 2800 (27.9)   Urine (mL/kg/hr) 4700 875 (1)   Blood  550 (0.6)   Total Output 4700 1425   Net -2700 +1375          PHYSICAL EXAMINATION: General:  Chronically ill appearing male in NAD Neuro:  Arousable easily and following commands. HEENT:  [place]/AT, PERRL, EOM-I and MMM. Cardiovascular:  RRR, Nl S1/S2, -M/R/G. Lungs:  Coarse BS diffusely. Abdomen:  Soft, NT, ND and +BS. Musculoskeletal:  -edema and -tenderness. Skin:   Intact  LABS:  CBC  Recent Labs Lab 11/25/13 1603  WBC 7.2  HGB 12.9*  HCT 39.1  PLT 219   Coag's  Recent Labs Lab 11/29/13 0945  APTT 23*  INR 0.94   BMET  Recent Labs Lab 11/25/13 1603  NA 136*  K 4.1  CL 91*  CO2 32  BUN 28*  CREATININE 1.28  GLUCOSE 231*   Electrolytes  Recent Labs Lab 11/25/13 1603  CALCIUM 9.0  Sepsis Markers No results found for this basename: LATICACIDVEN, PROCALCITON, O2SATVEN,  in the last 168 hours ABG No results found for this basename: PHART, PCO2ART, PO2ART,  in the last 168 hours Liver Enzymes  Recent Labs Lab 11/25/13 1603  AST 14  ALT 28  ALKPHOS 82  BILITOT 0.4  ALBUMIN 3.1*   Cardiac Enzymes No results found for this basename: TROPONINI, PROBNP,  in the last 168 hours Glucose  Recent Labs Lab 11/29/13 0939 11/29/13 1220 11/29/13 1757 11/29/13 2132 11/30/13 0735  GLUCAP 223* 169* 143* 134* 86    Imaging Dg Thoracic Spine 2 View  11/30/2013   CLINICAL DATA:  T8-12 fusion. Metastatic renal cell carcinoma at T10.  EXAM: THORACIC SPINE - 2 VIEW  COMPARISON:  Embolization procedure 11/29/2013. MRI of the thoracic spine 11/18/2013.  FINDINGS: Two intraoperative views of the thoracolumbar spine are submitted. Embolization material at the T10 level is stable.  Pedicle screw and rod fixation is evident with bilateral pedicle screws at T8, T9, T11, and T12. The inferior aspect of the construct is not imaged.  IMPRESSION: 1. Lumbar stabilization as described. 2. Embolization material at T10.   Electronically Signed   By: Lawrence Santiago M.D.   On: 11/30/2013 15:31   CXR: None  ASSESSMENT / PLAN:  PULMONARY A: VDRF post op. History of OSA P:   - Extubate. - CPAP while asleep. - Minimize 3% saline.  CARDIOVASCULAR A: HTN, likely related to pain. P:  - Resume home meds.  RENAL A:  Renal cell CA, s/p nephrectomy. P:   - BMET now. - Replace electrolytes as indicated.  GASTROINTESTINAL A:  None  active. P:   - Resume diet.  HEMATOLOGIC A:  None active P:  - Check CBC  INFECTIOUS A:  None active. P:   - Surgical prophylaxis.  ENDOCRINE A:  DM   P:   - Resume home treatment.  NEUROLOGIC A:  Post op as above. P:   - Morphine 2-4 mg q2 hours PRN, watch for sedation.  TODAY'S SUMMARY: 73 year old male post op back surgery, history of OSA, anesthesia did not feel comfortable extubating, will extubate now and resume previously ordered medications.  Increase pain medications and monitor for now in the ICU.  I have personally obtained a history, examined the patient, evaluated laboratory and imaging results, formulated the assessment and plan and placed orders.  CRITICAL CARE: The patient is critically ill with multiple organ systems failure and requires high complexity decision making for assessment and support, frequent evaluation and titration of therapies, application of advanced monitoring technologies and extensive interpretation of multiple databases. Critical Care Time devoted to patient care services described in this note is 35 minutes.   Rush Farmer, M.D. [facility] Pulmonary/Critical Care Medicine. Pager: [phone]. After hours pager: [phone].  11/30/2013, 4:05 PM

## 2013-11-30 NOTE — Progress Notes (Signed)
Placed patient on auto titrate max 20 min 6 with 5lpm 02 bleed in.  Patient is tolerating well.

## 2013-11-30 NOTE — Op Note (Signed)
PREOP DIAGNOSIS: Metastatic T10 tumor   POSTOP DIAGNOSIS: Same  PROCEDURE: 1. T10 laminectomy 2. Bilateral trans-pedicular approach for resection of spinal tumor and decompression of spinal cord and exiting nerve roots 3. Posterior non-segmental instrumentation, T8-T12 utilizing Zimmer percutaneous pedicle screws 4. Use of intraoperative microscope for microdissection  SURGEON: Dr. Consuella Lose, MD  ASSISTANT: Dr. Earnie Larsson, MD  ANESTHESIA: General Endotracheal  EBL: 550cc  SPECIMENS: T10 tumor for permanent pathology  DRAINS: None  COMPLICATIONS: None immediate  CONDITION: Hemodynamically stable to PACU  HISTORY: [name] is a 73 y.o. male who initially presented with back pain with a known history of renal cell carcinoma status post nephrectomy, and workup included CT and MRI of the cervical and thoracic spines which demonstrated a left-sided C3 lesion, as well as a T10 lesion. The lesion in the thoracic spine nearly encompass the spinal cord, with moderate to severe stenosis, and incurvation of the vertebral body. Given these findings, surgical decompression in preparation for radiotherapy was indicated, with instrumented stabilization. The risks and benefits of the surgery were explained in detail to the patient and his family. After all their questions were answered, verbal and written consent was obtained and placed in the chart. The patient was electively admitted the day before and underwent endovascular embolization of the tumor.  PROCEDURE IN DETAIL: After informed consent was obtained and witnessed, the patient was brought to the operating room. After induction of general anesthesia, the patient was positioned on the operative table in the prone position. All pressure points were meticulously padded. Skin incision was then marked out and prepped and draped in the usual sterile fashion.  After timeout was conducted, skin incision was infiltrated with local  anesthetic with epinephrine. Skin incision was then made sharply and Bovie electrocautery was used to dissect through subcutaneous tissue until the fascia was identified. This was then incised, and subperiosteal dissection was carried out. Intraoperative x-ray demonstrated initial dissection was at the T9 level. The T10 level was then identified and subperiosteal dissection was carried out until the entire lamina and proximal transverse processes were identified. Level was confirmed with intraoperative fluoroscopy. The microscope was then draped sterilely and brought into the field, and the remainder of the decompression was done under the microscope using microdissection.  Using a high-speed drill and Kerrison rongeurs, a complete T10 laminectomy was completed. The high-speed drill was then used to drill out the cortex of the bilateral pedicles. Just under the cortex, the cancellus bone was seen to be completely infiltrated with tumor which was sent for permanent pathology. Bilateral facetectomies at T10 were completed. The superior portion of bilateral superior articulating processes of T11 were then resected. Of note, there was a large perineural cysts encountered in the left T10-11 foramen.  After facetectomy was completed, tumor was resected from both pedicles, and dissected from the lateral aspect of the thecal sac and the superior aspect of the nerve roots. Once this was done, the ventral epidural space and the vertebral body was entered, and tumor was easily removed from this space using a combination of curettes and rongeurs. Good decompression of the ventral epidural space was confirmed using a ball tip dissectors. The endplates and most of the vertebral body cortex appeared to be intact. Therefore did not feel that the patient required an interbody prosthetic.  Having completed the decompression, good hemostasis was achieved using morcellized Gelfoam with thrombin placed in the hard out vertebral  body, and pressure was applied using multiple cottonoids. Once good  hemostasis was achieved, the site of decompression was irrigated with copious amounts of antibiotic saline.  Attention was then turned to placement of the instrumentation. The microscope was removed, and under AP fluoroscopy, entry points for the T8 and T9 pedicles were identified, and the pedicles were cannulated with Jamshidi needles. Under lateral fluoroscopy, K wires were inserted, the pedicles were then tapped with a 5.0 mm tap, and 5.0 mm x 45 mm screws were placed in T8 and T9. Using a similar technique, the pedicles at T11 and T12 were cannulated using Jamshidi needles under AP fluoroscopy. Under lateral fluoroscopy, K wires were inserted, these pedicles were then tapped to 6.0 mm, and 45 mm x 6.0 mm screws were placed. A rod was then passed under direct vision, with slight kyphosis bent into the rod. Setscrews are then placed and final tightened. Final AP and lateral fluoroscopic images demonstrated good position of the hardware.  At this point the wound is irrigated with copious amounts of normal saline irrigation. Good hemostasis was again confirmed. The fascial openings were then closed with interrupted 0 Vicryl stitches. The deep subcutaneous layer was closed with interrupted 0 Vicryl stitches, and the subcuticular layer was closed with interrupted 3-0 Vicryl stitches. The skin was then closed using Dermabond.  At the end of the case all sponge, needle, and instrument counts were correct.

## 2013-12-01 ENCOUNTER — Inpatient Hospital Stay (HOSPITAL_COMMUNITY): Payer: Medicare Other

## 2013-12-01 DIAGNOSIS — Z794 Long term (current) use of insulin: Secondary | ICD-10-CM | POA: Diagnosis not present

## 2013-12-01 DIAGNOSIS — C801 Malignant (primary) neoplasm, unspecified: Secondary | ICD-10-CM

## 2013-12-01 DIAGNOSIS — E119 Type 2 diabetes mellitus without complications: Secondary | ICD-10-CM | POA: Diagnosis not present

## 2013-12-01 LAB — CBC
HCT: 34.9 % — ABNORMAL LOW (ref 39.0–52.0)
HEMOGLOBIN: 11.6 g/dL — AB (ref 13.0–17.0)
MCH: 25.8 pg — ABNORMAL LOW (ref 26.0–34.0)
MCHC: 33.2 g/dL (ref 30.0–36.0)
MCV: 77.6 fL — ABNORMAL LOW (ref 78.0–100.0)
Platelets: 184 10*3/uL (ref 150–400)
RBC: 4.5 MIL/uL (ref 4.22–5.81)
RDW: 14.8 % (ref 11.5–15.5)
WBC: 12.7 10*3/uL — ABNORMAL HIGH (ref 4.0–10.5)

## 2013-12-01 LAB — POCT I-STAT 7, (LYTES, BLD GAS, ICA,H+H)
Acid-Base Excess: 9 mmol/L — ABNORMAL HIGH (ref 0.0–2.0)
Bicarbonate: 34 mEq/L — ABNORMAL HIGH (ref 20.0–24.0)
Calcium, Ion: 1.09 mmol/L — ABNORMAL LOW (ref 1.13–1.30)
HEMATOCRIT: 41 % (ref 39.0–52.0)
HEMOGLOBIN: 13.9 g/dL (ref 13.0–17.0)
O2 Saturation: 100 %
PH ART: 7.461 — AB (ref 7.350–7.450)
POTASSIUM: 3.7 meq/L (ref 3.7–5.3)
Patient temperature: 36.5
Sodium: 135 mEq/L — ABNORMAL LOW (ref 137–147)
TCO2: 35 mmol/L (ref 0–100)
pCO2 arterial: 47.5 mmHg — ABNORMAL HIGH (ref 35.0–45.0)
pO2, Arterial: 199 mmHg — ABNORMAL HIGH (ref 80.0–100.0)

## 2013-12-01 LAB — BASIC METABOLIC PANEL
BUN: 23 mg/dL (ref 6–23)
CHLORIDE: 100 meq/L (ref 96–112)
CO2: 28 mEq/L (ref 19–32)
Calcium: 8.4 mg/dL (ref 8.4–10.5)
Creatinine, Ser: 1.17 mg/dL (ref 0.50–1.35)
GFR calc Af Amer: 71 mL/min — ABNORMAL LOW (ref >90)
GFR calc non Af Amer: 61 mL/min — ABNORMAL LOW (ref >90)
Glucose, Bld: 162 mg/dL — ABNORMAL HIGH (ref 70–99)
POTASSIUM: 4.9 meq/L (ref 3.7–5.3)
Sodium: 138 mEq/L (ref 137–147)

## 2013-12-01 LAB — POCT I-STAT GLUCOSE
Glucose, Bld: 78 mg/dL (ref 70–99)
Operator id: 133981

## 2013-12-01 LAB — GLUCOSE, CAPILLARY
GLUCOSE-CAPILLARY: 154 mg/dL — AB (ref 70–99)
GLUCOSE-CAPILLARY: 203 mg/dL — AB (ref 70–99)
Glucose-Capillary: 208 mg/dL — ABNORMAL HIGH (ref 70–99)
Glucose-Capillary: 123 mg/dL — ABNORMAL HIGH (ref 70–99)

## 2013-12-01 LAB — PHOSPHORUS: Phosphorus: 4.9 mg/dL — ABNORMAL HIGH (ref 2.3–4.6)

## 2013-12-01 LAB — MAGNESIUM: Magnesium: 2.4 mg/dL (ref 1.5–2.5)

## 2013-12-01 MED ORDER — ONDANSETRON HCL 4 MG/2ML IJ SOLN: 4.0000 mg | Freq: Four times a day (QID) | INTRAMUSCULAR | Status: DC | PRN

## 2013-12-01 MED ORDER — NALOXONE HCL 0.4 MG/ML IJ SOLN: 0.4000 mg | INTRAMUSCULAR | Status: DC | PRN

## 2013-12-01 MED ORDER — SODIUM CHLORIDE 0.9 % IJ SOLN: 9.0000 mL | INTRAMUSCULAR | Status: DC | PRN

## 2013-12-01 MED ORDER — DEXAMETHASONE 2 MG PO TABS
2.0000 mg | ORAL_TABLET | Freq: Two times a day (BID) | ORAL | Status: DC
  Administered 2013-12-02 – 2013-12-05 (×7): 2 mg via ORAL
  Filled 2013-12-01 (×9): qty 1

## 2013-12-01 MED ORDER — HYDROMORPHONE 0.3 MG/ML IV SOLN
INTRAVENOUS | Status: DC
  Administered 2013-12-01: 7.5 mg via INTRAVENOUS
  Administered 2013-12-01: 1.5 mg via INTRAVENOUS
  Administered 2013-12-01: 1.2 mg via INTRAVENOUS
  Administered 2013-12-02: 1.8 mg via INTRAVENOUS
  Administered 2013-12-02: 05:00:00 via INTRAVENOUS
  Administered 2013-12-02: 1.2 mg via INTRAVENOUS
  Administered 2013-12-02: 4.8 mg via INTRAVENOUS
  Administered 2013-12-02: 1.2 mg via INTRAVENOUS
  Administered 2013-12-02: 18:00:00 via INTRAVENOUS
  Administered 2013-12-02: 2.4 mg via INTRAVENOUS
  Administered 2013-12-03: 3 mg via INTRAVENOUS
  Administered 2013-12-03: 2.4 mg via INTRAVENOUS
  Administered 2013-12-03: 0.6 mg via INTRAVENOUS
  Filled 2013-12-01 (×4): qty 25

## 2013-12-01 MED ORDER — INSULIN ASPART 100 UNIT/ML ~~LOC~~ SOLN
0.0000 [IU] | Freq: Every day | SUBCUTANEOUS | Status: DC
  Administered 2013-12-01 – 2013-12-04 (×3): 2 [IU] via SUBCUTANEOUS

## 2013-12-01 MED ORDER — INSULIN ASPART 100 UNIT/ML ~~LOC~~ SOLN
0.0000 [IU] | Freq: Three times a day (TID) | SUBCUTANEOUS | Status: DC
  Administered 2013-12-01: 3 [IU] via SUBCUTANEOUS
  Administered 2013-12-01 – 2013-12-02 (×2): 7 [IU] via SUBCUTANEOUS
  Administered 2013-12-02: 3 [IU] via SUBCUTANEOUS
  Administered 2013-12-02: 4 [IU] via SUBCUTANEOUS
  Administered 2013-12-03: 7 [IU] via SUBCUTANEOUS
  Administered 2013-12-03 – 2013-12-04 (×2): 4 [IU] via SUBCUTANEOUS
  Administered 2013-12-04: 3 [IU] via SUBCUTANEOUS
  Administered 2013-12-05: 4 [IU] via SUBCUTANEOUS

## 2013-12-01 MED ORDER — DIPHENHYDRAMINE HCL 12.5 MG/5ML PO ELIX
12.5000 mg | ORAL_SOLUTION | Freq: Four times a day (QID) | ORAL | Status: DC | PRN
Start: 2013-12-01 — End: 2013-12-03
  Filled 2013-12-01: qty 5

## 2013-12-01 MED ORDER — DIPHENHYDRAMINE HCL 50 MG/ML IJ SOLN
12.5000 mg | Freq: Four times a day (QID) | INTRAMUSCULAR | Status: DC | PRN
Start: 2013-12-01 — End: 2013-12-03

## 2013-12-01 NOTE — Evaluation (Signed)
Physical Therapy Evaluation Patient Details Name: [name] MRN: [id number] DOB: 01/09/1941 Today's Date: 12/01/2013   History of Present Illness  73 year old male with history of renal cell carcinoma and sleep apnea who presents to the ICU post op thoracic ten laminectomy decompression with thoracic eight-thoracic twelve instrumentation.    Clinical Impression  Patient demonstrates deficits in functional mobility as indicated below. Will benefit from continued skilled PT to address deficits and maximize function. Will see as indicated and progress as tolerated.    Follow Up Recommendations Home health PT;Supervision/Assistance - 24 hour (pending progress may not need HHPT)    Equipment Recommendations   (TBD)    Recommendations for Other Services       Precautions / Restrictions Precautions Precautions: Fall;Back Precaution Comments: educated on back precautions Restrictions Weight Bearing Restrictions: No      Mobility  Bed Mobility Overal bed mobility: Needs Assistance Bed Mobility: Rolling;Sidelying to Sit Rolling: Min assist Sidelying to sit: Mod assist       General bed mobility comments: VCs for positoning and sequencing  Transfers Overall transfer level: Needs assistance Equipment used: None Transfers: Sit to/from [employer] Transfers Sit to Stand: Min guard Stand pivot transfers: Min guard       General transfer comment: patient able to perform without physical assist  Ambulation/Gait Ambulation/Gait assistance: Min guard Ambulation Distance (Feet): 8 Feet Assistive device: None       General Gait Details: pivotal steps to chair limited by pain at this time  Stairs            Wheelchair Mobility    Modified Rankin (Stroke Patients Only)       Balance Overall balance assessment: No apparent balance deficits (not formally assessed)                                           Pertinent Vitals/Pain 5/10     Home Living Family/patient expects to be discharged to:: Private residence Living Arrangements: Alone Available Help at Discharge: Friend(s) Type of Home: Mobile home Home Access: Stairs to enter Entrance Stairs-Rails: Right;Left;Can reach both Entrance Stairs-Number of Steps: 4 Home Layout: One level Home Equipment: Shower seat      Prior Function Level of Independence: Independent               Hand Dominance        Extremity/Trunk Assessment   Upper Extremity Assessment: Overall WFL for tasks assessed           Lower Extremity Assessment: Generalized weakness         Communication   Communication: No difficulties  Cognition Arousal/Alertness: Awake/alert Behavior During Therapy: WFL for tasks assessed/performed Overall Cognitive Status: Within Functional Limits for tasks assessed                      General Comments General comments (skin integrity, edema, etc.): educated and reviewed back precautions with patient verbally, spoke with patient regarding positional changes, activity tolerance, progression of mobility, and expectations for therapy. Patient very appreciative. Educated on techniques for pain management.    Exercises        Assessment/Plan    PT Assessment Patient needs continued PT services  PT Diagnosis Difficulty walking;Abnormality of gait;Generalized weakness;Acute pain   PT Problem List Decreased strength;Decreased activity tolerance;Decreased balance;Decreased mobility;Pain  PT Treatment Interventions DME instruction;Gait training;Stair  training;Functional mobility training;Therapeutic activities;Therapeutic exercise;Balance training;Patient/family education   PT Goals (Current goals can be found in the Care Plan section) Acute Rehab PT Goals Patient Stated Goal: to get better PT Goal Formulation: With patient Time For Goal Achievement: 12/08/13 Potential to Achieve Goals: Good    Frequency Min 5X/week   Barriers  to discharge Decreased caregiver support      Co-evaluation               End of Session Equipment Utilized During Treatment: Gait belt Activity Tolerance: Patient tolerated treatment well;Patient limited by pain Patient left: in chair;with call bell/phone within reach;with family/visitor present Nurse Communication: Mobility status         Time: 2951-8841 PT Time Calculation (min): 25 min   Charges:   PT Evaluation $Initial PT Evaluation Tier I: 1 Procedure PT Treatments $Therapeutic Activity: 8-22 mins $Self Care/Home Management: 2023-02-19   PT G CodesDuncan Dull 12/01/2013, 4:16 PM Alben Deeds, [place] DPT  [phone]

## 2013-12-01 NOTE — Progress Notes (Signed)
Pt seen and examined. No issues overnight. Pt reports back soreness but otherwise no c/o. Moving legs well.  EXAM: Temp:  [97.7 F (36.5 C)-98.6 F (37 C)] 98.1 F (36.7 C) (06/24 0738) Pulse Rate:  [51-80] 67 (06/24 0900) Resp:  [9-23] 17 (06/24 0900) BP: (113-157)/(52-77) 125/52 mmHg (06/24 0900) SpO2:  [89 %-100 %] 95 % (06/24 0900) Arterial Line BP: (151-181)/(57-68) 181/66 mmHg (06/24 0600) FiO2 (%):  [50 %] 50 % (06/23 1530) Intake/Output     06/23 0701 - 06/24 0700 06/24 0701 - 06/25 0700   P.O. 120 180   I.V. (mL/kg) 3425 (34.1) 150 (1.5)   IV Piggyback 500    Total Intake(mL/kg) 4045 (40.2) 330 (3.3)   Urine (mL/kg/hr) 2150 (0.9) 350 (0.8)   Blood 550 (0.2)    Total Output 2700 350   Net +1345 -20         Awake, alert, oriented Speech fluent 5/5 BUE/BLE Wound c/d/i  LABS: Lab Results  Component Value Date   CREATININE 1.17 12/01/2013   BUN 23 12/01/2013   NA 138 12/01/2013   K 4.9 12/01/2013   CL 100 12/01/2013   CO2 28 12/01/2013   Lab Results  Component Value Date   WBC 12.7* 12/01/2013   HGB 11.6* 12/01/2013   HCT 34.9* 12/01/2013   MCV 77.6* 12/01/2013   PLT 184 12/01/2013    IMPRESSION: - 73 y.o. male POD#1 T10 decompression, T8-T12 stabilization - Neurologically at baseline  PLAN: - Transfer to floor - Mobilize with PT/OT

## 2013-12-01 NOTE — Progress Notes (Signed)
Has tolerated extubation without resp problems. Pain 6/10. No new complaints. No distress  Filed Vitals:   12/01/13 0600 12/01/13 0700 12/01/13 0738 12/01/13 0800  BP: 123/75 138/60  147/59  Pulse: 63 58  58  Temp:   98.1 F (36.7 C)   TempSrc:   Axillary   Resp: 23 14  12   Height:      Weight:      SpO2: 96% 93%  95%   NAD Pupils constricted, reactive HEENT o/w WNL No JVD Chest clear RRR s M NABS, soft Ext warm without edema   I have reviewed all of today's lab results. Relevant abnormalities are discussed in the A/P section   IMPRESSION: VDRF, resolved H/O OSA Htn - controlled Renal cell Ca DM II - adequately controlled Post op pain  PLAN/REC: Care plan reviewed and adjusted - see orders PCA hydromorphone ordered DC arterial catheter and Foley  PCCM will sign off. Please call if we can be of further assistance  Merton Border, MD ; [facility] [phone].  After 5:30 PM or weekends, call [phone]

## 2013-12-02 ENCOUNTER — Encounter (HOSPITAL_COMMUNITY): Payer: Self-pay | Admitting: Neurosurgery

## 2013-12-02 LAB — GLUCOSE, CAPILLARY
Glucose-Capillary: 219 mg/dL — ABNORMAL HIGH (ref 70–99)
Glucose-Capillary: 183 mg/dL — ABNORMAL HIGH (ref 70–99)
Glucose-Capillary: 173 mg/dL — ABNORMAL HIGH (ref 70–99)

## 2013-12-02 MED ORDER — SODIUM CHLORIDE 0.9 % IV SOLN: INTRAVENOUS | Status: DC

## 2013-12-02 NOTE — Progress Notes (Signed)
Occupational Therapy Evaluation Patient Details Name: [name] MRN: [id number] DOB: 1940/10/27 Today's Date: 12/02/2013    History of Present Illness 73 year old male with history of renal cell carcinoma and sleep apnea who presents to the ICU post op thoracic ten laminectomy decompression with thoracic eight-thoracic twelve instrumentation.     Clinical Impression   PTA, pt lived alone and was independent with ADL. Pt presetns with decreased independence with LB ADL. Pt will benefit from OT to reach mod I level with LB ADL and increase independence with IADL tasks. Pt will have intermittent S at D/C.     Follow Up Recommendations  No OT follow up;Supervision - Intermittent    Equipment Recommendations  None recommended by OT    Recommendations for Other Services       Precautions / Restrictions Precautions Precautions: Fall;Back Precaution Booklet Issued: Yes (comment)      Mobility Bed Mobility Overal bed mobility: Needs Assistance Bed Mobility: Sit to Sidelying Rolling: Supervision       Sit to sidelying: Supervision    Transfers Overall transfer level: Needs assistance   Transfers: Stand Pivot Transfers;Sit to/from Stand Sit to Stand: Supervision Stand pivot transfers: Min guard            Balance Overall balance assessment: No apparent balance deficits (not formally assessed)                                          ADL Overall ADL's : Needs assistance/impaired Eating/Feeding: Independent   Grooming: Set up;Supervision/safety   Upper Body Bathing: Supervision/ safety;Set up;Sitting   Lower Body Bathing: Min guard;Sit to/from stand   Upper Body Dressing : Set up;Supervision/safety;Sitting   Lower Body Dressing: Min guard;Sit to/from stand   Toilet Transfer: Supervision/safety;Stand-pivot   Toileting- Water quality scientist and Hygiene: Min guard       Functional mobility during ADLs: Min guard General ADL Comments:  Began education on back precautions for ADL     Vision                     Perception     Praxis      Pertinent Vitals/Pain VSS. No c/o pain     Hand Dominance     Extremity/Trunk Assessment Upper Extremity Assessment Upper Extremity Assessment: Overall WFL for tasks assessed   Lower Extremity Assessment Lower Extremity Assessment: Defer to PT evaluation   Cervical / Trunk Assessment Cervical / Trunk Assessment: Normal   Communication Communication Communication: No difficulties   Cognition Arousal/Alertness: Awake/alert Behavior During Therapy: WFL for tasks assessed/performed Overall Cognitive Status: Within Functional Limits for tasks assessed                     General Comments       Exercises       Shoulder Instructions      Home Living Family/patient expects to be discharged to:: Private residence Living Arrangements: Alone Available Help at Discharge: Friend(s);Available PRN/intermittently Type of Home: Mobile home Home Access: Stairs to enter Entrance Stairs-Number of Steps: 4 Entrance Stairs-Rails: Right;Left;Can reach both Home Layout: One level     Bathroom Shower/Tub: Tub/shower unit;Curtain Shower/tub characteristics: Architectural technologist: Handicapped height Bathroom Accessibility: Yes How Accessible: Accessible via walker Home Equipment: Shower seat   Additional Comments: rec installing grab bars      Prior Functioning/Environment Level of Independence:  Independent             OT Diagnosis: Generalized weakness;Acute pain   OT Problem List: Decreased strength;Decreased activity tolerance;Decreased knowledge of use of DME or AE;Decreased knowledge of precautions;Obesity;Pain   OT Treatment/Interventions: Self-care/ADL training;Energy conservation;DME and/or AE instruction;Therapeutic activities;Patient/family education    OT Goals(Current goals can be found in the care plan section) Acute Rehab OT  Goals Patient Stated Goal: to get better OT Goal Formulation: With patient Time For Goal Achievement: 12/16/13 Potential to Achieve Goals: Good  OT Frequency: Min 2X/week   Barriers to D/C:            Co-evaluation              End of Session Equipment Utilized During Treatment: Gait belt Nurse Communication: Mobility status  Activity Tolerance: Patient tolerated treatment well Patient left: in bed;with call bell/phone within reach;with bed alarm set   Time: 6073-7106 OT Time Calculation (min): 21 min Charges:  OT General Charges $OT Visit: 1 Procedure OT Evaluation $Initial OT Evaluation Tier I: 1 Procedure OT Treatments $Self Care/Home Management : 8-22 mins G-Codes:    WARD,HILLARY Dec 24, 2013, 2:34 PM   [facility], OTR/L  [phone] 12/24/2013

## 2013-12-02 NOTE — Progress Notes (Signed)
PT Cancellation Note  Patient Details Name: [name] MRN: [id number] DOB: March 11, 1941   Cancelled Treatment:    Reason Eval/Treat Not Completed: Fatigue/lethargy limiting ability to participate. Per pt and RN, he sat up in chair x 2 1/2 hours this morning. He is currently back in bed and states he is too tired to try walking right now. Asked PT to return later. Will return as schedule allows.   SASSER,LYNN 12/02/2013, 12:16 PM Pager [phone]

## 2013-12-02 NOTE — Progress Notes (Signed)
No issues overnight. Pt doing well, no c/o x back soreness. Walked a short distance with PT yesterday. Was put on PCA.  EXAM:  BP 119/62  Pulse 63  Temp(Src) 97.8 F (36.6 C) (Oral)  Resp 16  Ht 5\' 7"  (1.702 m)  Wt 100.5 kg (221 lb 9 oz)  BMI 34.69 kg/m2  SpO2 99%  Awake, alert, oriented  Speech fluent, appropriate  CN grossly intact  5/5 BUE/BLE  Wound c/d/i  IMPRESSION:  73 y.o. male POD#2 s/p T10 decompression/stabilization - Neurologically well  PLAN: - Cont mobilization with PT/OT - Cont PCA for now.

## 2013-12-03 ENCOUNTER — Ambulatory Visit (HOSPITAL_COMMUNITY): Payer: Medicare Other

## 2013-12-03 LAB — GLUCOSE, CAPILLARY
GLUCOSE-CAPILLARY: 195 mg/dL — AB (ref 70–99)
GLUCOSE-CAPILLARY: 230 mg/dL — AB (ref 70–99)
Glucose-Capillary: 203 mg/dL — ABNORMAL HIGH (ref 70–99)
Glucose-Capillary: 177 mg/dL — ABNORMAL HIGH (ref 70–99)
Glucose-Capillary: 156 mg/dL — ABNORMAL HIGH (ref 70–99)

## 2013-12-03 MED ORDER — OXYCODONE HCL 5 MG PO TABS
10.0000 mg | ORAL_TABLET | ORAL | Status: DC | PRN
  Administered 2013-12-03 – 2013-12-05 (×5): 10 mg via ORAL
  Filled 2013-12-03 (×5): qty 2

## 2013-12-03 NOTE — Progress Notes (Signed)
Report received from 36M at 1430 and pt arrived on the unit at 1510 via wheelchair with belongings at side. Pt A&O x4; ambulated x1 from chair to chair. Pt denies any pain. Pt oriented to the room and unit; pt incision looks clean and dry with dermabond. Pt vitals taken and pain assessed. Pt sitting up in chair with call light within reach. Will continue to monitor quietly. Francis Gaines Unice Vantassel RN.

## 2013-12-03 NOTE — Progress Notes (Signed)
UR completed.  Michelle Bryson, RN BSN [facility] CCM Trauma/Neuro ICU Case Manager [phone]  

## 2013-12-03 NOTE — Progress Notes (Signed)
Physical Therapy Treatment Patient Details Name: [name] MRN: [id number] DOB: Feb 01, 1941 Today's Date: 12/03/2013    History of Present Illness 73 year old male with history of renal cell carcinoma and sleep apnea who presents to the ICU post op thoracic ten laminectomy decompression with thoracic eight-thoracic twelve instrumentation.      PT Comments    Pt reports he feels safer with a RW to assist him during gait. He has only been able to walk limited distances (<100 ft) PTA due to back pain and weakness. Requires further PT to continue education on mobility while adhering to back precautions (esp bed mobility), for safe use of RW, and for stair training.   Follow Up Recommendations  Home health PT;Supervision for mobility/OOB (pending progress may not need HHPT)     Equipment Recommendations  Rolling walker with 5" wheels    Recommendations for Other Services       Precautions / Restrictions Precautions Precautions: Fall;Back Precaution Comments: pt able to state one; re-educated on back precautions Restrictions Weight Bearing Restrictions: No    Mobility  Bed Mobility Overal bed mobility: Needs Assistance Bed Mobility: Rolling;Sidelying to Sit Rolling: Supervision Sidelying to sit: Min guard       General bed mobility comments: vc for sequencing and to maintain back precautions;  +use of rail  Transfers Overall transfer level: Needs assistance Equipment used: Rolling walker (2 wheeled) Transfers: Sit to/from Stand Sit to Stand: Supervision         General transfer comment: required vc for safe use of RW (both to/from chair)  Ambulation/Gait Ambulation/Gait assistance: Supervision Ambulation Distance (Feet): 90 Feet Assistive device: Rolling walker (2 wheeled) Gait Pattern/deviations: Step-through pattern;Decreased stride length Gait velocity: decr   General Gait Details: pt reports he could not walk any further than ~90 ft PTA; states his back  limits his walking "feels like it's going to collapse"   Stairs            Wheelchair Mobility    Modified Rankin (Stroke Patients Only)       Balance                                    Cognition Arousal/Alertness: Awake/alert Behavior During Therapy: WFL for tasks assessed/performed Overall Cognitive Status: Within Functional Limits for tasks assessed                      Exercises      General Comments General comments (skin integrity, edema, etc.): RN reports pt is not able to read; back handout not provided      Pertinent Vitals/Pain Thoracic area "feels like an accordion that's about to fold up" Pt able to self-direct how much walking he felt he could safely complete.  SaO2 on RA 97%    Home Living                      Prior Function            PT Goals (current goals can now be found in the care plan section) Acute Rehab PT Goals Patient Stated Goal: to get better Progress towards PT goals: Progressing toward goals    Frequency  Min 5X/week    PT Plan Current plan remains appropriate    Co-evaluation             End of Session   Activity  Tolerance: Patient tolerated treatment well Patient left: in chair;with call bell/phone within reach     Time: 1251-1309 PT Time Calculation (min): 18 min  Charges:  $Gait Training: 8-22 mins                    G Codes:      SASSER,LYNN 12-09-2013, 1:26 PM Pager [phone]

## 2013-12-03 NOTE — Progress Notes (Signed)
Occupational Therapy Treatment Patient Details Name: [name] MRN: [id number] DOB: Mar 17, 1941 Today's Date: 12/03/2013    History of present illness 73 year old male with history of renal cell carcinoma and sleep apnea who presents to the ICU post op thoracic ten laminectomy decompression with thoracic eight-thoracic twelve instrumentation.     OT comments  Pt making steady progress. Requires vc for following back precautions. Pt states that his friend is going to get him his AE and she will be able to assist him with IADL tasks. Pt familiar with all precautions and states he "has been through this before. Encourage pt to walk with staff and staff to S for ADL OT signing off.  Follow Up Recommendations  No OT follow up;Supervision - Intermittent    Equipment Recommendations  None recommended by OT    Recommendations for Other Services      Precautions / Restrictions Precautions Precautions: Fall;Back Precaution Comments: Pt staes "i won't bend or try to twist much" Restrictions Weight Bearing Restrictions: No       Mobility Bed Mobility Overal bed mobility: Needs Assistance Bed Mobility: Rolling;Sidelying to Sit Rolling: Supervision Sidelying to sit: Min guard       General bed mobility comments: vc for sequencing and to maintain back precautions;  +use of rail  Transfers Overall transfer level: Needs assistance Equipment used: Rolling walker (2 wheeled) Transfers: Sit to/from [facility] Sit to Stand: Supervision Stand pivot transfers: Supervision       General transfer comment: vc for hand placement for useof RW    Balance                                   ADL                           Toilet Transfer: Comfort height toilet;Grab bars;Ambulation;Supervision/safety   Toileting- Clothing Manipulation and Hygiene: Supervision/safety;Cueing for back precautions         General ADL Comments: Completed education  regarding compensatory techniques for ADL      Vision                     Perception     Praxis      Cognition   Behavior During Therapy: WFL for tasks assessed/performed Overall Cognitive Status: Within Functional Limits for tasks assessed                       Extremity/Trunk Assessment               Exercises     Shoulder Instructions       General Comments      Pertinent Vitals/ Pain       Noc/o pain. VSS  Home Living                                          Prior Functioning/Environment              Frequency Min 2X/week     Progress Toward Goals  OT Goals(current goals can now be found in the care plan section)  Progress towards OT goals: Progressing toward goals  Acute Rehab OT Goals Patient Stated Goal: to get better OT Goal Formulation: With patient  Time For Goal Achievement: 12/16/13 Potential to Achieve Goals: Good ADL Goals Pt Will Perform Lower Body Bathing: with modified independence;sit to/from stand;with adaptive equipment Pt Will Perform Lower Body Dressing: with modified independence;with adaptive equipment;sit to/from stand Pt Will Perform Toileting - Clothing Manipulation and hygiene: with modified independence;sit to/from stand;with adaptive equipment Additional ADL Goal #1: independently verbalize 3/3 back precautions  Plan Discharge plan remains appropriate    Co-evaluation                 End of Session Equipment Utilized During Treatment: Rolling walker   Activity Tolerance Patient tolerated treatment well   Patient Left with call bell/phone within reach;Other (comment) (in w/c)   Nurse Communication Mobility status        Time: 9628-3662 OT Time Calculation (min): 13 min  Charges: OT General Charges $OT Visit: 1 Procedure OT Treatments $Self Care/Home Management : 8-22 mins  WARD,HILLARY 12/03/2013, 3:07 PM   [facility], OTR/L  [phone] 12/03/2013

## 2013-12-04 LAB — GLUCOSE, CAPILLARY
GLUCOSE-CAPILLARY: 116 mg/dL — AB (ref 70–99)
GLUCOSE-CAPILLARY: 244 mg/dL — AB (ref 70–99)
Glucose-Capillary: 128 mg/dL — ABNORMAL HIGH (ref 70–99)
Glucose-Capillary: 170 mg/dL — ABNORMAL HIGH (ref 70–99)

## 2013-12-04 NOTE — Progress Notes (Signed)
Pt.'s mask appears to look like our full face CPAP mask. Pt. Insist that the mask is his & someone brought it to him from home. Pt. Refused to have the tubing changed out to insure that he had an exhalation port.

## 2013-12-04 NOTE — Progress Notes (Signed)
PT Cancellation Note  Patient Details Name: [name] MRN: [id number] DOB: [birth year]   Cancelled Treatment:    Reason Eval/Treat Not Completed: Patient declined, no reason specified. Pt reports being up all day with nursing and too tired at this time. Agreeable to therapy coming back later today or tomorrow as time allows.   Willow Ora 12/04/2013, 1:29 PM  Willow Ora, PTA Office- [phone]

## 2013-12-04 NOTE — Progress Notes (Signed)
Patient ID: [name], male   DOB: January 19, 1941, 73 y.o.   MRN: [id number] Subjective:  The patient is alert and pleasant. He is in no apparent stress. He wants to go home tomorrow.  Objective: Vital signs in last 24 hours: Temp:  [97.3 F (36.3 C)-98.8 F (37.1 C)] 97.6 F (36.4 C) (06/27 0519) Pulse Rate:  [35-64] 60 (06/27 0519) Resp:  [15-20] 20 (06/27 0519) BP: (123-158)/(58-78) 158/78 mmHg (06/27 0519) SpO2:  [93 %-99 %] 96 % (06/27 0519) Weight:  [101.152 kg (223 lb)] 101.152 kg (223 lb) (06/26 1527)  Intake/Output from previous day: 06/26 0701 - 06/27 0700 In: 480 [P.O.:480] Out: 1050 [Urine:1050] Intake/Output this shift:    Physical exam the patient is alert and oriented. He is moving his lower extremities well.  Lab Results: No results found for this basename: WBC, HGB, HCT, PLT,  in the last 72 hours BMET No results found for this basename: NA, K, CL, CO2, GLUCOSE, BUN, CREATININE, CALCIUM,  in the last 72 hours  Studies/Results: No results found.  Assessment/Plan: Postop day #2: We will continue to mobilize the patient he may go home tomorrow.  LOS: 5 days     JENKINS,JEFFREY D 12/04/2013, 9:07 AM

## 2013-12-04 NOTE — Progress Notes (Signed)
Changed pressure to 8 cmH2O per pt request. Pt is wearing a ffm ( what pt wears at home) pt is tolerating CPAP well at this time.

## 2013-12-04 NOTE — Progress Notes (Signed)
Physical Therapy Treatment Patient Details Name: [name] MRN: [id number] DOB: June 30, 1940 Today's Date: 12/04/2013    History of Present Illness 73 year old male with history of renal cell carcinoma and sleep apnea who presents to the ICU post op thoracic ten laminectomy decompression with thoracic eight-thoracic twelve instrumentation.      PT Comments    Patient is progressing well towards physical therapy goals, ambulating up to 150 feet x2 today with supervision while using a rolling walker. Stair training complete and pt safely performs this task. He is able to recall 2/3 back precautions; reviewed handout for safe mobility with back precautions. Patient will continue to benefit from skilled physical therapy services at home to further improve independence with functional mobility. Reports he will have some help at home from his brother.    Follow Up Recommendations  Home health PT;Supervision for mobility/OOB     Equipment Recommendations  Rolling walker with 5" wheels    Recommendations for Other Services       Precautions / Restrictions Precautions Precautions: Fall;Back Precaution Comments: Pt states 2/3 back precautions. Reviewed with Pt. Restrictions Weight Bearing Restrictions: No    Mobility  Bed Mobility Overal bed mobility: Needs Assistance Bed Mobility: Rolling;Sidelying to Sit Rolling: Supervision Sidelying to sit: Supervision       General bed mobility comments: VCs for technique with log roll. No physical assist. Pt rushes through technique. Cued to take his time and perform carefully for safety.  Transfers Overall transfer level: Needs assistance Equipment used: Rolling walker (2 wheeled) Transfers: Sit to/from Stand Sit to Stand: Supervision         General transfer comment: Supervision for safety performs sit>stand with correct hand placement and safely maintains back precautions. Educated on stand>sit for hand placement, not to hold  walker.  Ambulation/Gait Ambulation/Gait assistance: Supervision Ambulation Distance (Feet): 150 Feet (x2) Assistive device: Rolling walker (2 wheeled) Gait Pattern/deviations: Step-through pattern;Decreased stride length   Gait velocity interpretation: Below normal speed for age/gender General Gait Details: VCs for walker placement closer to BOS and for forward gaze. Good stability with RW and no loss of balance.   Stairs Stairs: Yes Stairs assistance: Supervision Stair Management: Two rails;Step to pattern;Forwards Number of Stairs: 2 (x2) General stair comments: Educated on sequencing and safe navigation of stairs similar to home environment. Supervision for safety only. pt performs well and no physical assist required.  Wheelchair Mobility    Modified Rankin (Stroke Patients Only)       Balance                                    Cognition Arousal/Alertness: Awake/alert Behavior During Therapy: WFL for tasks assessed/performed Overall Cognitive Status: Within Functional Limits for tasks assessed                      Exercises      General Comments        Pertinent Vitals/Pain Pt with no complaints of pain during therapy session. Patient repositioned in chair for comfort.     Home Living                      Prior Function            PT Goals (current goals can now be found in the care plan section) Progress towards PT goals: Progressing toward goals  Frequency  Min 5X/week    PT Plan Current plan remains appropriate    Co-evaluation             End of Session   Activity Tolerance: Patient tolerated treatment well Patient left: in chair;with call bell/phone within reach;with chair alarm set     Time: 1510-1533 PT Time Calculation (min): 23 min  Charges:  $Gait Training: 8-22 mins $Self Care/Home Management: 8-22                    G Codes:      Elayne Snare, [place]  Ellouise Newer 12/04/2013, 4:24 PM

## 2013-12-05 LAB — GLUCOSE, CAPILLARY: Glucose-Capillary: 199 mg/dL — ABNORMAL HIGH (ref 70–99)

## 2013-12-05 MED ORDER — OXYCODONE HCL 10 MG PO TABS: 10.0000 mg | ORAL_TABLET | ORAL | Status: DC | PRN

## 2013-12-05 MED ORDER — DSS 100 MG PO CAPS: 100.0000 mg | ORAL_CAPSULE | Freq: Two times a day (BID) | ORAL | Status: DC

## 2013-12-05 NOTE — Progress Notes (Signed)
Discharge orders received.  Discharge instructions and follow-up instructions reviewed with pt.  Pt education completed.  Stable upon discharge, transported out via wheelchair. Cori Razor, RN 12/05/2013 11:13 AM

## 2013-12-05 NOTE — Discharge Summary (Signed)
Physician Discharge Summary  Patient ID: [name] MRN: [id number] DOB/AGE: 01-12-1941 73 y.o.  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 Diamantina Monks performed a thoracic spine embolization and subsequent  T8-T12 decompression instrumentation, and fusion on the patient on 11/29/2013.  The patient's postoperative course was unremarkable. On 12/05/2013 the patient requested discharge to home. He was given oral and written discharge instructions. All his questions were answered.  Consults: None Significant Diagnostic Studies: None Treatments: Thoracic embolization, T8-T12 decompression, instrumentation, and fusion. Discharge Exam: Blood pressure 129/67, pulse 56, temperature 97.9 F (36.6 C), temperature source Axillary, resp. rate 20, height 5\' 7"  (1.702 m), weight 101.152 kg (223 lb), SpO2 96.00%. The patient is alert and pleasant. His strength is grossly normal in his lower extremities. His wound is healing well.  Disposition: Home  Discharge Instructions   Call MD for:  difficulty breathing, headache or visual disturbances    Complete by:  As directed      Call MD for:  extreme fatigue    Complete by:  As directed      Call MD for:  hives    Complete by:  As directed      Call MD for:  persistant dizziness or light-headedness    Complete by:  As directed      Call MD for:  persistant nausea and vomiting    Complete by:  As directed      Call MD for:  redness, tenderness, or signs of infection (pain, swelling, redness, odor or green/yellow discharge around incision site)    Complete by:  As directed      Call MD for:  severe uncontrolled pain    Complete by:  As directed      Call MD for:  temperature >100.4    Complete  by:  As directed      Diet - low sodium heart healthy    Complete by:  As directed      Discharge instructions    Complete by:  As directed   Call [phone] for a followup appointment. Take a stool softener while you are using pain medications.     Driving Restrictions    Complete by:  As directed   Do not drive for 2 weeks.     Increase activity slowly    Complete by:  As directed      Lifting restrictions    Complete by:  As directed   Do not lift more than 5 pounds. No excessive bending or twisting.     May shower / Bathe    Complete by:  As directed   He may shower after the pain she is removed 3 days after surgery. Leave the incision alone.     No dressing needed    Complete by:  As directed             Medication List    STOP taking these medications       HYDROcodone-acetaminophen 10-325 MG per tablet  Commonly known as:  NORCO      TAKE these medications       amiodarone 200 MG tablet  Commonly known as:  PACERONE  Take 200 mg by mouth daily.     B-D ULTRAFINE III SHORT PEN 31G X 8  MM Misc  Generic drug:  Insulin Pen Needle     COMBIVENT RESPIMAT 20-100 MCG/ACT Aers respimat  Generic drug:  Ipratropium-Albuterol  Inhale 1 puff into the lungs every 6 (six) hours as needed for shortness of breath.     dexamethasone 4 MG tablet  Commonly known as:  DECADRON  Take 1 tablet (4 mg total) by mouth 2 (two) times daily with a meal. Check glucose as this medication can elevate it.     DSS 100 MG Caps  Take 100 mg by mouth 2 (two) times daily.     furosemide 40 MG tablet  Commonly known as:  LASIX  Take 40 mg by mouth daily.     Insulin Glargine 100 UNIT/ML Solostar Pen  Commonly known as:  LANTUS  Inject 40 Units into the skin at bedtime.     methadone 10 MG tablet  Commonly known as:  DOLOPHINE  Take 10 mg by mouth at bedtime as needed for moderate pain.     metoprolol 50 MG tablet  Commonly known as:  LOPRESSOR  Take 50 mg by mouth 2 (two) times  daily.     nitroGLYCERIN 0.4 MG SL tablet  Commonly known as:  NITROSTAT  Place 1 tablet (0.4 mg total) under the tongue every 5 (five) minutes as needed. Chest pain     nitroGLYCERIN 0.2 mg/hr patch  Commonly known as:  NITRODUR - Dosed in mg/24 hr  Place 0.2 mg onto the skin daily.     Oxycodone HCl 10 MG Tabs  Take 1 tablet (10 mg total) by mouth every 4 (four) hours as needed for severe pain or breakthrough pain.     pantoprazole 40 MG tablet  Commonly known as:  PROTONIX  Take 40 mg by mouth daily.     promethazine 25 MG tablet  Commonly known as:  PHENERGAN  Take 1 tablet (25 mg total) by mouth every 6 (six) hours as needed for nausea or vomiting.     tamsulosin 0.4 MG Caps capsule  Commonly known as:  FLOMAX  Take 0.4 mg by mouth daily.         SignedOphelia Charter 12/05/2013, 9:28 AM

## 2013-12-06 ENCOUNTER — Other Ambulatory Visit: Payer: Self-pay | Admitting: Radiation Therapy

## 2013-12-06 LAB — TYPE AND SCREEN
ABO/RH(D): A POS
Antibody Screen: NEGATIVE
UNIT DIVISION: 0
UNIT DIVISION: 0
Unit division: 0
Unit division: 0

## 2013-12-08 ENCOUNTER — Ambulatory Visit (HOSPITAL_BASED_OUTPATIENT_CLINIC_OR_DEPARTMENT_OTHER): Payer: Medicare Other | Admitting: Oncology

## 2013-12-08 ENCOUNTER — Encounter: Payer: Self-pay | Admitting: Oncology

## 2013-12-08 ENCOUNTER — Telehealth: Payer: Self-pay | Admitting: Oncology

## 2013-12-08 VITALS — BP 133/69 | HR 66 | Temp 97.8°F | Resp 20 | Ht 67.0 in | Wt 214.3 lb

## 2013-12-08 DIAGNOSIS — C649 Malignant neoplasm of unspecified kidney, except renal pelvis: Secondary | ICD-10-CM

## 2013-12-08 DIAGNOSIS — C7951 Secondary malignant neoplasm of bone: Secondary | ICD-10-CM

## 2013-12-08 DIAGNOSIS — D492 Neoplasm of unspecified behavior of bone, soft tissue, and skin: Secondary | ICD-10-CM

## 2013-12-08 DIAGNOSIS — C7952 Secondary malignant neoplasm of bone marrow: Secondary | ICD-10-CM

## 2013-12-08 NOTE — Progress Notes (Signed)
lmoam for [employer]. Patient lives in [place] and would like a ride, to his first chemo treatment. He is afraid to drive. Does not know what to expect. Gave patient's name and D.O.B.

## 2013-12-08 NOTE — Progress Notes (Signed)
Hematology and Oncology Follow Up Visit  [name] [id number] 1941/05/11 73 y.o. 12/08/2013 8:47 AM [name] [name], MDDondiego, Bobby Bathe, MD   Principle Diagnosis: 73 year old gentleman with renal cell carcinoma diagnosed in 2009. He is status post right nephrectomy and now has metastatic disease to the bone.   Prior Therapy: Status post right nephrectomy done at [facility] indicate a renal cell carcinoma grade 2 done on 06/19/2007. 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  Current therapy: Under evaluation for systemic therapy.  Interim History:  Mr. [name] presents today for a followup visit. He is a gentleman I saw in consultation for the first time on 11/10/2013 with the above issues. Since his last visit, he underwent T10 laminectomy as mentioned and has recovered well. He still reports neck pain that is radiating to his jaw but no neurological symptoms. He is not reporting any headaches or blurry vision. Does not report any syncope or falls. He does not report any back pain and feels that soreness from the incision is improving. He does not report any neurological weakness in his lower extremities either. He did not report any frequency urgency or hesitancy. Does not report any nausea or vomiting or abdominal pain. His appetite has been reasonable and able to swallow without any difficulties. He is able to ambulate short distances without any falls. He has not reported any palpitation or orthopnea. Had not reported any leg edema or wheezing. Rest of his review of systems unremarkable.  Medications: I have reviewed the patient's current medications.  Current Outpatient Prescriptions  Medication Sig Dispense Refill  . amiodarone (PACERONE) 200 MG tablet Take 200 mg by mouth daily.      . B-D ULTRAFINE III SHORT PEN 31G X 8 MM MISC       . COMBIVENT RESPIMAT 20-100 MCG/ACT AERS  respimat Inhale 1 puff into the lungs every 6 (six) hours as needed for shortness of breath.       . dexamethasone (DECADRON) 4 MG tablet Take 1 tablet (4 mg total) by mouth 2 (two) times daily with a meal. Check glucose as this medication can elevate it.  60 tablet  1  . diltiazem (TIAZAC) 360 MG 24 hr capsule Take 360 mg by mouth daily.      Marland Kitchen docusate sodium 100 MG CAPS Take 100 mg by mouth 2 (two) times daily.  60 capsule  0  . furosemide (LASIX) 40 MG tablet Take 40 mg by mouth daily.      . Insulin Glargine (LANTUS) 100 UNIT/ML Solostar Pen Inject 40 Units into the skin at bedtime.      . methadone (DOLOPHINE) 10 MG tablet Take 10 mg by mouth at bedtime as needed for moderate pain.       . metoprolol (LOPRESSOR) 50 MG tablet Take 50 mg by mouth 2 (two) times daily.       . nitroGLYCERIN (NITROSTAT) 0.4 MG SL tablet Place 1 tablet (0.4 mg total) under the tongue every 5 (five) minutes as needed. Chest pain  25 tablet  5  . pantoprazole (PROTONIX) 40 MG tablet Take 40 mg by mouth daily.      . promethazine (PHENERGAN) 25 MG tablet Take 1 tablet (25 mg total) by mouth every 6 (six) hours as needed for nausea or vomiting.  30 tablet  5  . tamsulosin (FLOMAX) 0.4 MG CAPS capsule Take 0.4 mg by  mouth daily.      Marland Kitchen HYDROcodone-acetaminophen (NORCO) 10-325 MG per tablet Take 1 tablet by mouth every 6 (six) hours as needed.      . naproxen (NAPROSYN) 375 MG tablet Take 375 mg by mouth daily.      . nitroGLYCERIN (NITRODUR - DOSED IN MG/24 HR) 0.2 mg/hr patch Place 0.2 mg onto the skin daily.      Marland Kitchen oxyCODONE 10 MG TABS Take 1 tablet (10 mg total) by mouth every 4 (four) hours as needed for severe pain or breakthrough pain.  100 tablet  0   No current facility-administered medications for this visit.     Allergies:  Allergies  Allergen Reactions  . Other Other (See Comments)    Anesthesia (gas) medication, Reaction: went into coma in the 1960's    Past Medical History, Surgical history,  Social history, and Family History were reviewed and updated.   Physical Exam: Blood pressure 133/69, pulse 66, temperature 97.8 F (36.6 C), temperature source Oral, resp. rate 20, height 5\' 7"  (1.702 m), weight 214 lb 4.8 oz (97.206 kg). ECOG: 1 General appearance: alert and cooperative Head: Normocephalic, without obvious abnormality Neck: no adenopathy Lymph nodes: Cervical, supraclavicular, and axillary nodes normal. Heart:regular rate and rhythm, S1, S2 normal, no murmur, click, rub or gallop Lung:chest clear, no wheezing, rales, normal symmetric air entry Abdomin: soft, non-tender, without masses or organomegaly EXT:no erythema, induration, or nodules   Lab Results: Lab Results  Component Value Date   WBC 12.7* 12/01/2013   HGB 11.6* 12/01/2013   HCT 34.9* 12/01/2013   MCV 77.6* 12/01/2013   PLT 184 12/01/2013     Chemistry      Component Value Date/Time   NA 138 12/01/2013 0559   NA 143 11/10/2013 1039   K 4.9 12/01/2013 0559   K 4.1 11/10/2013 1039   CL 100 12/01/2013 0559   CO2 28 12/01/2013 0559   CO2 32* 11/10/2013 1039   BUN 23 12/01/2013 0559   BUN 15.5 11/10/2013 1039   CREATININE 1.17 12/01/2013 0559   CREATININE 1.4* 11/10/2013 1039   GLU 149* 11/10/2013 1039      Component Value Date/Time   CALCIUM 8.4 12/01/2013 0559   CALCIUM 9.1 11/10/2013 1039   CALCIUM 8.1* 08/26/2012 1312   ALKPHOS 82 11/25/2013 1603   ALKPHOS 93 11/10/2013 1039   AST 14 11/25/2013 1603   AST 13 11/10/2013 1039   ALT 28 11/25/2013 1603   ALT 23 11/10/2013 1039   BILITOT 0.4 11/25/2013 1603   BILITOT 0.46 11/10/2013 1039       Impression and Plan:   73 year old gentleman with the following issues: 1. Metastatic renal cell carcinoma with initial diagnosis in 2009. He presented with hematuria and underwent a right nephrectomy done at [facility]. Now has metastatic disease to the bone confirmed by biopsy. He is undergoing staging CT scan chest abdomen and pelvis next week  for better staging. He will likely need systemic therapy in the form of TKI such as Votrient or Sutent. I anticipate starting this after the completion of his radiation therapy.  2. Cervical spine mass: He is scheduled to have radiation therapy in in the near future and once that is completed we'll evaluate systemic therapy at that time.  3. Bone directed therapy: He will be a good candidate for Xgeva which we will discuss in future visits.  4. Followup: Will be in one month after he completes radiation therapy to initiate  systemic therapy at that time.    [facility], MD 7/1/20158:47 AM

## 2013-12-13 ENCOUNTER — Ambulatory Visit
Admit: 2013-12-13 | Discharge: 2013-12-13 | Disposition: A | Discharge status: Home / Self Care | Payer: Medicare Other | Attending: Radiation Oncology | Admitting: Radiation Oncology

## 2013-12-13 ENCOUNTER — Ambulatory Visit
Admission: RE | Admit: 2013-12-13 | Discharge: 2013-12-13 | Disposition: A | Discharge status: Home / Self Care | Payer: Medicare Other | Source: Ambulatory Visit | Attending: Radiation Oncology | Admitting: Radiation Oncology

## 2013-12-13 VITALS — BP 115/75 | HR 73

## 2013-12-13 DIAGNOSIS — C7931 Secondary malignant neoplasm of brain: Secondary | ICD-10-CM

## 2013-12-13 DIAGNOSIS — C7949 Secondary malignant neoplasm of other parts of nervous system: Principal | ICD-10-CM

## 2013-12-13 DIAGNOSIS — C649 Malignant neoplasm of unspecified kidney, except renal pelvis: Secondary | ICD-10-CM

## 2013-12-13 DIAGNOSIS — C7951 Secondary malignant neoplasm of bone: Secondary | ICD-10-CM

## 2013-12-13 DIAGNOSIS — C7952 Secondary malignant neoplasm of bone marrow: Secondary | ICD-10-CM

## 2013-12-13 MED ORDER — IOHEXOL 300 MG/ML  SOLN
10.0000 mL | Freq: Once | INTRAMUSCULAR | Status: AC | PRN
  Administered 2013-12-13: 10 mL via INTRATHECAL

## 2013-12-13 MED ORDER — DIAZEPAM 5 MG PO TABS
5.0000 mg | ORAL_TABLET | Freq: Once | ORAL | Status: AC
  Administered 2013-12-13: 5 mg via ORAL

## 2013-12-13 NOTE — Progress Notes (Signed)
Patient states he has been off Phenergan "for a couple days or more."  Brita Romp, RN

## 2013-12-13 NOTE — Discharge Instructions (Addendum)
Myelogram Discharge Instructions  1. Go home and rest quietly for the next 24 hours.  It is important to lie flat for the next 24 hours.  Get up only to go to the restroom.  You may lie in the bed or on a couch on your back, your stomach, your left side or your right side.  You may have one pillow under your head.  You may have pillows between your knees while you are on your side or under your knees while you are on your back.  2. DO NOT drive today.  Recline the seat as far back as it will go, while still wearing your seat belt, on the way home.  3. You may get up to go to the bathroom as needed.  You may sit up for 10 minutes to eat.  You may resume your normal diet and medications unless otherwise indicated.  Drink lots of extra fluids today and tomorrow.  4. The incidence of headache, nausea, or vomiting is about 5% (one in 20 patients).  If you develop a headache, lie flat and drink plenty of fluids until the headache goes away.  Caffeinated beverages may be helpful.  If you develop severe nausea and vomiting or a headache that does not go away with flat bed rest, call [phone].  5. You may resume normal activities after your 24 hours of bed rest is over; however, do not exert yourself strongly or do any heavy lifting tomorrow. If when you get up you have a headache when standing, go back to bed and force fluids for another 24 hours.  6. Call your physician for a follow-up appointment.  The results of your myelogram will be sent directly to your physician by the following day.  7. If you have any questions or if complications develop after you arrive home, please call [phone].  Discharge instructions have been explained to the patient.  The patient, or the person responsible for the patient, fully understands these instructions.      May resume Phenergan on December 14, 2013, after 11:00 am.

## 2013-12-13 NOTE — Progress Notes (Signed)
  Radiation Oncology         [phone]) [phone] ________________________________  Name: [name] MRN: [id number]  Date: 12/13/2013  DOB: 15-Apr-1941  SIMULATION AND TREATMENT PLANNING NOTE  outpatient  DIAGNOSIS:   Renal cell carcinoma bone metastases  NARRATIVE:  The patient was brought to the [place].  Identity was confirmed.  All relevant records and images related to the planned course of therapy were reviewed.  The patient freely provided informed written consent to proceed with treatment after reviewing the details related to the planned course of therapy. The consent form was witnessed and verified by the simulation staff.    Then, the patient was set-up in a stable reproducible  supine position for radiation therapy. Aquaplast Mask for head, vaclock for body. CT images were obtained.  Surface markings were placed.  The CT images were loaded into the planning software.    TREATMENT PLANNING NOTE: Treatment planning then occurred.  The radiation prescription was entered and confirmed.    A total of 6 medically necessary complex treatment devices were fabricated and supervised by me - 4 fields with MLCs to block parotid tissue, throat, lungs, heart AND also the mask and vaclock. I have requested : Isodose Plan.    The patient will receive 30 Gy in 10 fractions. T9-11 will be treated with a wedged pair and C2-C4 treated with 2 opposed lateral fields.  SRS was initially intended but acute numbness in legs suggested epidural disease was compromising neurologic function and therefore convention RT was planned urgently in lieu of SRS.  Additionally, movement during MRI imaging was felt to compromise ability to plan SRS safely.   -----------------------------------  Eppie Gibson, MD

## 2013-12-14 ENCOUNTER — Other Ambulatory Visit: Payer: Medicare Other

## 2013-12-15 ENCOUNTER — Ambulatory Visit
Admission: RE | Admit: 2013-12-15 | Discharge: 2013-12-15 | Disposition: A | Discharge status: Home / Self Care | Payer: Medicare Other | Source: Ambulatory Visit | Attending: Radiation Oncology | Admitting: Radiation Oncology

## 2013-12-15 DIAGNOSIS — C7931 Secondary malignant neoplasm of brain: Secondary | ICD-10-CM

## 2013-12-15 DIAGNOSIS — C7949 Secondary malignant neoplasm of other parts of nervous system: Principal | ICD-10-CM

## 2013-12-15 MED ORDER — GADOBENATE DIMEGLUMINE 529 MG/ML IV SOLN
20.0000 mL | Freq: Once | INTRAVENOUS | Status: AC | PRN
Start: 2013-12-15 — End: 2013-12-15
  Administered 2013-12-15: 20 mL via INTRAVENOUS

## 2013-12-16 ENCOUNTER — Ambulatory Visit
Admission: RE | Admit: 2013-12-16 | Discharge: 2013-12-16 | Disposition: A | Discharge status: Home / Self Care | Payer: Medicare Other | Source: Ambulatory Visit | Attending: Radiation Oncology | Admitting: Radiation Oncology

## 2013-12-16 ENCOUNTER — Ambulatory Visit: Admission: RE | Admit: 2013-12-16 | Payer: Medicare Other | Source: Ambulatory Visit

## 2013-12-16 ENCOUNTER — Encounter: Payer: Self-pay | Admitting: Radiation Oncology

## 2013-12-16 ENCOUNTER — Encounter: Payer: Self-pay | Admitting: Radiation Therapy

## 2013-12-16 ENCOUNTER — Inpatient Hospital Stay (HOSPITAL_COMMUNITY)
Admission: AD | Admit: 2013-12-16 | Discharge: 2013-12-20 | DRG: 543 | Disposition: A | Discharge status: Skilled Nursing Facility | Payer: Medicare Other | Source: Ambulatory Visit | Attending: Internal Medicine | Admitting: Internal Medicine

## 2013-12-16 ENCOUNTER — Encounter: Payer: Self-pay | Admitting: Internal Medicine

## 2013-12-16 ENCOUNTER — Encounter (HOSPITAL_COMMUNITY): Payer: Self-pay

## 2013-12-16 DIAGNOSIS — C649 Malignant neoplasm of unspecified kidney, except renal pelvis: Secondary | ICD-10-CM | POA: Diagnosis present

## 2013-12-16 DIAGNOSIS — E7800 Pure hypercholesterolemia, unspecified: Secondary | ICD-10-CM | POA: Diagnosis present

## 2013-12-16 DIAGNOSIS — E119 Type 2 diabetes mellitus without complications: Secondary | ICD-10-CM | POA: Diagnosis present

## 2013-12-16 DIAGNOSIS — I509 Heart failure, unspecified: Secondary | ICD-10-CM | POA: Diagnosis present

## 2013-12-16 DIAGNOSIS — N183 Chronic kidney disease, stage 3 unspecified: Secondary | ICD-10-CM

## 2013-12-16 DIAGNOSIS — G4730 Sleep apnea, unspecified: Secondary | ICD-10-CM | POA: Diagnosis present

## 2013-12-16 DIAGNOSIS — Z833 Family history of diabetes mellitus: Secondary | ICD-10-CM

## 2013-12-16 DIAGNOSIS — Z8249 Family history of ischemic heart disease and other diseases of the circulatory system: Secondary | ICD-10-CM

## 2013-12-16 DIAGNOSIS — I10 Essential (primary) hypertension: Secondary | ICD-10-CM | POA: Diagnosis present

## 2013-12-16 DIAGNOSIS — R5383 Other fatigue: Secondary | ICD-10-CM

## 2013-12-16 DIAGNOSIS — N179 Acute kidney failure, unspecified: Secondary | ICD-10-CM | POA: Diagnosis present

## 2013-12-16 DIAGNOSIS — E860 Dehydration: Secondary | ICD-10-CM

## 2013-12-16 DIAGNOSIS — R739 Hyperglycemia, unspecified: Secondary | ICD-10-CM

## 2013-12-16 DIAGNOSIS — I152 Hypertension secondary to endocrine disorders: Secondary | ICD-10-CM

## 2013-12-16 DIAGNOSIS — S68118A Complete traumatic metacarpophalangeal amputation of other finger, initial encounter: Secondary | ICD-10-CM

## 2013-12-16 DIAGNOSIS — J449 Chronic obstructive pulmonary disease, unspecified: Secondary | ICD-10-CM | POA: Diagnosis present

## 2013-12-16 DIAGNOSIS — Z550 Illiteracy and low-level literacy: Secondary | ICD-10-CM

## 2013-12-16 DIAGNOSIS — Z9181 History of falling: Secondary | ICD-10-CM

## 2013-12-16 DIAGNOSIS — I447 Left bundle-branch block, unspecified: Secondary | ICD-10-CM | POA: Diagnosis present

## 2013-12-16 DIAGNOSIS — J4489 Other specified chronic obstructive pulmonary disease: Secondary | ICD-10-CM | POA: Diagnosis present

## 2013-12-16 DIAGNOSIS — I2510 Atherosclerotic heart disease of native coronary artery without angina pectoris: Secondary | ICD-10-CM | POA: Diagnosis present

## 2013-12-16 DIAGNOSIS — C7952 Secondary malignant neoplasm of bone marrow: Principal | ICD-10-CM

## 2013-12-16 DIAGNOSIS — D62 Acute posthemorrhagic anemia: Secondary | ICD-10-CM | POA: Diagnosis present

## 2013-12-16 DIAGNOSIS — I4891 Unspecified atrial fibrillation: Secondary | ICD-10-CM | POA: Diagnosis present

## 2013-12-16 DIAGNOSIS — E1159 Type 2 diabetes mellitus with other circulatory complications: Secondary | ICD-10-CM | POA: Diagnosis present

## 2013-12-16 DIAGNOSIS — Z794 Long term (current) use of insulin: Secondary | ICD-10-CM

## 2013-12-16 DIAGNOSIS — Z884 Allergy status to anesthetic agent status: Secondary | ICD-10-CM

## 2013-12-16 DIAGNOSIS — J441 Chronic obstructive pulmonary disease with (acute) exacerbation: Secondary | ICD-10-CM

## 2013-12-16 DIAGNOSIS — IMO0001 Reserved for inherently not codable concepts without codable children: Secondary | ICD-10-CM

## 2013-12-16 DIAGNOSIS — R109 Unspecified abdominal pain: Secondary | ICD-10-CM

## 2013-12-16 DIAGNOSIS — R209 Unspecified disturbances of skin sensation: Secondary | ICD-10-CM | POA: Diagnosis present

## 2013-12-16 DIAGNOSIS — R531 Weakness: Secondary | ICD-10-CM | POA: Diagnosis present

## 2013-12-16 DIAGNOSIS — R5381 Other malaise: Secondary | ICD-10-CM

## 2013-12-16 DIAGNOSIS — Z79899 Other long term (current) drug therapy: Secondary | ICD-10-CM

## 2013-12-16 DIAGNOSIS — C7951 Secondary malignant neoplasm of bone: Principal | ICD-10-CM | POA: Diagnosis present

## 2013-12-16 DIAGNOSIS — G4733 Obstructive sleep apnea (adult) (pediatric): Secondary | ICD-10-CM

## 2013-12-16 DIAGNOSIS — M5136 Other intervertebral disc degeneration, lumbar region: Secondary | ICD-10-CM

## 2013-12-16 DIAGNOSIS — J810 Acute pulmonary edema: Secondary | ICD-10-CM

## 2013-12-16 DIAGNOSIS — J189 Pneumonia, unspecified organism: Secondary | ICD-10-CM

## 2013-12-16 LAB — CBC
HCT: 37.8 % — ABNORMAL LOW (ref 39.0–52.0)
Hemoglobin: 12.5 g/dL — ABNORMAL LOW (ref 13.0–17.0)
MCH: 25.9 pg — ABNORMAL LOW (ref 26.0–34.0)
MCHC: 33.1 g/dL (ref 30.0–36.0)
MCV: 78.4 fL (ref 78.0–100.0)
PLATELETS: 183 10*3/uL (ref 150–400)
RBC: 4.82 MIL/uL (ref 4.22–5.81)
RDW: 15.8 % — ABNORMAL HIGH (ref 11.5–15.5)
WBC: 5.4 10*3/uL (ref 4.0–10.5)

## 2013-12-16 LAB — COMPREHENSIVE METABOLIC PANEL
ALT: 29 U/L (ref 0–53)
AST: 12 U/L (ref 0–37)
Albumin: 2.8 g/dL — ABNORMAL LOW (ref 3.5–5.2)
Alkaline Phosphatase: 113 U/L (ref 39–117)
Anion gap: 15 (ref 5–15)
BUN: 17 mg/dL (ref 6–23)
CO2: 26 mEq/L (ref 19–32)
Calcium: 8.8 mg/dL (ref 8.4–10.5)
Chloride: 96 mEq/L (ref 96–112)
Creatinine, Ser: 1.52 mg/dL — ABNORMAL HIGH (ref 0.50–1.35)
GFR calc Af Amer: 52 mL/min — ABNORMAL LOW (ref >90)
GFR calc non Af Amer: 45 mL/min — ABNORMAL LOW (ref >90)
Glucose, Bld: 159 mg/dL — ABNORMAL HIGH (ref 70–99)
Potassium: 3.8 mEq/L (ref 3.7–5.3)
SODIUM: 137 meq/L (ref 137–147)
TOTAL PROTEIN: 6.5 g/dL (ref 6.0–8.3)
Total Bilirubin: 0.5 mg/dL (ref 0.3–1.2)

## 2013-12-16 LAB — TSH: TSH: 3.75 u[IU]/mL (ref 0.350–4.500)

## 2013-12-16 MED ORDER — INSULIN GLARGINE 100 UNIT/ML SOLOSTAR PEN: 40.0000 [IU] | PEN_INJECTOR | Freq: Every day | SUBCUTANEOUS | Status: DC

## 2013-12-16 MED ORDER — SODIUM CHLORIDE 0.9 % IV SOLN
INTRAVENOUS | Status: DC
  Administered 2013-12-16: 21:00:00 via INTRAVENOUS

## 2013-12-16 MED ORDER — ONDANSETRON HCL 4 MG PO TABS: 4.0000 mg | ORAL_TABLET | Freq: Four times a day (QID) | ORAL | Status: DC | PRN

## 2013-12-16 MED ORDER — FOLIC ACID 1 MG PO TABS
1.0000 mg | ORAL_TABLET | Freq: Every day | ORAL | Status: DC
  Administered 2013-12-16 – 2013-12-20 (×5): 1 mg via ORAL
  Filled 2013-12-16 (×5): qty 1

## 2013-12-16 MED ORDER — ONDANSETRON HCL 4 MG/2ML IJ SOLN: 4.0000 mg | Freq: Four times a day (QID) | INTRAMUSCULAR | Status: DC | PRN

## 2013-12-16 MED ORDER — DOCUSATE SODIUM 100 MG PO CAPS
100.0000 mg | ORAL_CAPSULE | Freq: Two times a day (BID) | ORAL | Status: DC
  Administered 2013-12-16 – 2013-12-20 (×8): 100 mg via ORAL
  Filled 2013-12-16 (×9): qty 1

## 2013-12-16 MED ORDER — VITAMIN B-1 100 MG PO TABS
100.0000 mg | ORAL_TABLET | Freq: Every day | ORAL | Status: DC
  Administered 2013-12-16 – 2013-12-20 (×5): 100 mg via ORAL
  Filled 2013-12-16 (×5): qty 1

## 2013-12-16 MED ORDER — HEPARIN SODIUM (PORCINE) 5000 UNIT/ML IJ SOLN
5000.0000 [IU] | Freq: Three times a day (TID) | INTRAMUSCULAR | Status: DC
  Administered 2013-12-16 – 2013-12-20 (×11): 5000 [IU] via SUBCUTANEOUS
  Filled 2013-12-16 (×14): qty 1

## 2013-12-16 MED ORDER — ACETAMINOPHEN 325 MG PO TABS: 650.0000 mg | ORAL_TABLET | Freq: Four times a day (QID) | ORAL | Status: DC | PRN

## 2013-12-16 MED ORDER — METOPROLOL TARTRATE 50 MG PO TABS
50.0000 mg | ORAL_TABLET | Freq: Two times a day (BID) | ORAL | Status: DC
  Administered 2013-12-16 – 2013-12-20 (×8): 50 mg via ORAL
  Filled 2013-12-16 (×9): qty 1

## 2013-12-16 MED ORDER — IPRATROPIUM-ALBUTEROL 20-100 MCG/ACT IN AERS: 1.0000 | INHALATION_SPRAY | Freq: Four times a day (QID) | RESPIRATORY_TRACT | Status: DC | PRN

## 2013-12-16 MED ORDER — METHADONE HCL 10 MG PO TABS: 10.0000 mg | ORAL_TABLET | Freq: Every evening | ORAL | Status: DC | PRN

## 2013-12-16 MED ORDER — NITROGLYCERIN 0.4 MG SL SUBL: 0.4000 mg | SUBLINGUAL_TABLET | SUBLINGUAL | Status: DC | PRN

## 2013-12-16 MED ORDER — DILTIAZEM HCL ER BEADS 240 MG PO CP24
360.0000 mg | ORAL_CAPSULE | Freq: Every day | ORAL | Status: DC
  Administered 2013-12-17 – 2013-12-20 (×4): 360 mg via ORAL
  Filled 2013-12-16 (×4): qty 1

## 2013-12-16 MED ORDER — NITROGLYCERIN 0.2 MG/HR TD PT24
0.2000 mg | MEDICATED_PATCH | Freq: Every day | TRANSDERMAL | Status: DC
  Administered 2013-12-16 – 2013-12-19 (×4): 0.2 mg via TRANSDERMAL
  Filled 2013-12-16 (×5): qty 1

## 2013-12-16 MED ORDER — TAMSULOSIN HCL 0.4 MG PO CAPS
0.4000 mg | ORAL_CAPSULE | Freq: Every day | ORAL | Status: DC
  Administered 2013-12-17 – 2013-12-20 (×4): 0.4 mg via ORAL
  Filled 2013-12-16 (×4): qty 1

## 2013-12-16 MED ORDER — ADULT MULTIVITAMIN W/MINERALS CH
1.0000 | ORAL_TABLET | Freq: Every day | ORAL | Status: DC
  Administered 2013-12-16 – 2013-12-20 (×5): 1 via ORAL
  Filled 2013-12-16 (×5): qty 1

## 2013-12-16 MED ORDER — DEXAMETHASONE 4 MG PO TABS
8.0000 mg | ORAL_TABLET | Freq: Two times a day (BID) | ORAL | Status: DC
  Administered 2013-12-19 – 2013-12-20 (×2): 8 mg via ORAL
  Filled 2013-12-16 (×3): qty 2

## 2013-12-16 MED ORDER — ACETAMINOPHEN 650 MG RE SUPP: 650.0000 mg | Freq: Four times a day (QID) | RECTAL | Status: DC | PRN

## 2013-12-16 MED ORDER — IPRATROPIUM-ALBUTEROL 0.5-2.5 (3) MG/3ML IN SOLN: 3.0000 mL | Freq: Four times a day (QID) | RESPIRATORY_TRACT | Status: DC | PRN

## 2013-12-16 MED ORDER — PANTOPRAZOLE SODIUM 40 MG PO TBEC
40.0000 mg | DELAYED_RELEASE_TABLET | Freq: Every day | ORAL | Status: DC
  Administered 2013-12-17 – 2013-12-20 (×4): 40 mg via ORAL
  Filled 2013-12-16 (×4): qty 1

## 2013-12-16 MED ORDER — AMIODARONE HCL 200 MG PO TABS
200.0000 mg | ORAL_TABLET | Freq: Every day | ORAL | Status: DC
  Administered 2013-12-17 – 2013-12-20 (×4): 200 mg via ORAL
  Filled 2013-12-16 (×4): qty 1

## 2013-12-16 MED ORDER — MAGNESIUM CITRATE PO SOLN: 1.0000 | Freq: Once | ORAL | Status: AC | PRN

## 2013-12-16 MED ORDER — HYDROCODONE-ACETAMINOPHEN 5-325 MG PO TABS
1.0000 | ORAL_TABLET | ORAL | Status: DC | PRN
  Administered 2013-12-18 – 2013-12-20 (×4): 2 via ORAL
  Filled 2013-12-16 (×4): qty 2

## 2013-12-16 MED ORDER — DEXAMETHASONE 4 MG PO TABS
8.0000 mg | ORAL_TABLET | Freq: Four times a day (QID) | ORAL | Status: AC
  Administered 2013-12-16 – 2013-12-19 (×12): 8 mg via ORAL
  Filled 2013-12-16 (×12): qty 2

## 2013-12-16 MED ORDER — DEXAMETHASONE 4 MG PO TABS
4.0000 mg | ORAL_TABLET | Freq: Two times a day (BID) | ORAL | Status: DC
  Filled 2013-12-16: qty 1

## 2013-12-16 MED ORDER — INSULIN GLARGINE 100 UNIT/ML ~~LOC~~ SOLN
40.0000 [IU] | Freq: Every day | SUBCUTANEOUS | Status: DC
  Administered 2013-12-16 – 2013-12-19 (×4): 40 [IU] via SUBCUTANEOUS
  Filled 2013-12-16 (×4): qty 0.4

## 2013-12-16 MED ORDER — SORBITOL 70 % SOLN: 30.0000 mL | Freq: Every day | Status: DC | PRN

## 2013-12-16 MED ORDER — FUROSEMIDE 40 MG PO TABS
40.0000 mg | ORAL_TABLET | Freq: Every day | ORAL | Status: DC
  Administered 2013-12-17 – 2013-12-19 (×3): 40 mg via ORAL
  Filled 2013-12-16 (×3): qty 1

## 2013-12-16 NOTE — Progress Notes (Signed)
Chart note: Dr. Isidore Moos proceed with remote simulation today to his cervical and thoracic spine fields. He is being treated with a wedge pair to his lower thoracic spine (T9-T11). 2 separate multileaf collimators with wedges are utilized. He is being set up to right and left lateral fields with one set of multileaf collimators for a total of 3 complex treatment devices.

## 2013-12-16 NOTE — Progress Notes (Unsigned)
Patient ID: [name], male   DOB: 03-Aug-1940, 73 y.o.   MRN: [id number]  Request from Dr. Valere Dross for direct admission. Pt with known metastatic renal cell carcinoma initially diagnosed on 2009 with mets to cervical spine. He is status post right nephrectomy which was done at [facility]. Apparently pt has been getting progressively weaker and has radiation therapy scheduled for today 4 pm but dr. Valere Dross wants him admitted and proceed with radiation therapy inpatient.   Faye Ramsay, MD  [facility] Pager [phone]  If 7PM-7AM, please contact night-coverage [email or web address] Password [email or web address]

## 2013-12-16 NOTE — Progress Notes (Signed)
  [name], DOB 2041-04-22, had surgery to his T- spine on 6/23, and is scheduled for SRS to treat both his cervical and thoracic spine on Tuesday 7/14. I received a call this morning informing me that he has been experiencing numbness in his legs for the past 2 days. He feels like his legs are asleep and he has fallen 4 times because he can't feel them. His friend, Butch Penny, said that [place] thinks it started after the Thoracic myelogram that was done on Monday 7/6. He hasn't hurt himself from the falls, but is very concerned that his legs do not "feel right".   - I spoke with a radiologist, Dr. Jola Baptist, to see if these symptoms could have been a result of the myelogram and he said no. A headache would be a response from a myelogram, but no lower extremity numbness or weakness. - I spoke with the neurosurgeon on call, Dr. Ronnald Ramp, because Dr. Kathyrn Sheriff is out for the rest of the week. Dr. Ronnald Ramp looked at his scans and felt that things are "pretty tight" in his spinal canal and that he needs to get going with his radiation treatments. He does not feel that anything surgical should be done. He also suggested increasing his steroid dose from 4mg  bid to 8 mg every 6 hours x 3 days and then decrease to 8mg  bid.  - Per Dr. Isidore Moos, due to his current symptoms and Dr. Ronnald Ramp' recomendation, Mr. Rhew will not be treated with SRS, but started emergently this afternoon with conventional radiation instead.  Butch Penny, Mr. Padula friend and driver, will have him here this afternoon at 4:30. And she will help to make sure he begins taking the steroid, 8 mg  every 6 hours.   Butch Penny has asked that he be admitted for the remainder of his treatments. He is not able to take care of things himself and she is not able to bring him for anymore treatments due to her work schedule. These new symptoms have made it difficult for him to care for himself at home, he has fallen 4 times over the past 2 days. He also sounded very confused  on the phone when I spoke with him about changing his steroid does. I feel that he needs some assistance to insure he is taking these medications as directed.

## 2013-12-16 NOTE — Progress Notes (Signed)
[facility] Therapy Treatment Record Phone [phone]   Radiation Therapy was administered to [name] on: 12/16/2013  5:07 PM and was treatment # 1 out of a planned course of 10 treatments.

## 2013-12-16 NOTE — H&P (Signed)
[facility] History and Physical  [name]:[id number] DOB: 1941-03-05 DOA: 12/16/2013  Referring physician: Lucia Gaskins, MD PCP: Maricela Curet, MD   Chief Complaint: Weakness  HPI: [name] is a 73 y.o. 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 Patient states that he has not been able to ambulate very well of late. He states that he has had no bladder control loss. He has no CP noted. He has not had any syncope. Patient is being admitted so that radiation can be started. This morning he was experiencing numbness in his legs and also states that he had fallen due to the weakness. He had recently had a myelogram. His therapist spoke to neurosurgery and they felt that he needs to start on his radiation treatments as the spinal canal was looking tight. In addition they suggested increasing his steroid dosages.   Review of Systems:  Constitutional:  No weight loss, night sweats, Fevers, chills, fatigue.  HEENT:  No headaches, Difficulty swallowing  Cardio-vascular:  No chest pain, Orthopnea, PND, swelling in lower extremities  GI:  No heartburn, indigestion, abdominal pain, nausea, vomiting, diarrhea  Resp:  No shortness of breath with exertion or at rest. No excess mucus, no productive cough, No non-productive cough, No coughing up of blood  Skin:  no rash or lesions.  GU:  no dysuria, change in color of urine, no urgency or frequency. No flank pain.  Musculoskeletal:  Weakness in legs Psych:  No change in mood or affect.   Past Medical History  Diagnosis Date  . Coronary artery disease   . Hypertension   . High cholesterol   . Diabetes mellitus   . LBBB (left bundle branch block)   . Paroxysmal atrial fibrillation   . Pneumonia 10-2012  . CHF (congestive heart failure)   . Renal disorder     kidney cancer  . Hepatitis many yrs ago    no current liver problems per  pt. reportedly Hepatitis C  . Disc     6 discs loswer back, back brace prn  . Anemia   . Cancer 2010    right nephrectomy  . 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 Complication of anesthesia     years ago went into a coma  . Sleep apnea     cpap setting of 4, or uses oxygen 4 liters per [place]   Past Surgical History  Procedure Laterality Date  . Nephrectomy Right 2010  . Esophagogastroduodenoscopy Left 10/02/2012    Dr. Paulita Fujita: normal   . Colonoscopy Left 10/03/2012    Dr. Michail Sermon: poorly prepped colon, lipomatous ileocecal valve. Path revealed focal acute inflammation with question of NSAID-induced injury.   . Back surgery  1960's and 1970's    lower x 2  . Left ring finger traumatic amputation      jumped out of truck, finger caught  . Coronary angioplasty with stent placement  yrs ago    x 5 or 6--DES TO LAD AND RAMUS  . Radiology with anesthesia N/A 11/29/2013    Procedure: RADIOLOGY WITH ANESTHESIA;  Surgeon: Consuella Lose, MD;  Location: [place];  Service: Radiology;  Laterality: N/A;  . Posterior lumbar fusion 4 level N/A 11/30/2013    Procedure: THORACIC TEN LAMINECTOMY DECOMPRESSION WITH THORACIC EIGHT-THORACIC TWELVE INSTRUMENTATION;  Surgeon: Consuella Lose, MD;  Location: [place] [facility];  Service: Neurosurgery;  Laterality: N/A;   Social History:  reports that he has been passively smoking.  He has never used smokeless tobacco. He reports that he does not drink alcohol or use illicit drugs.  Allergies  Allergen Reactions  . Other Other (See Comments)    Anesthesia (gas) medication, Reaction: went into coma in the 1960's    Family History  Problem Relation Age of Onset  . Hyperlipidemia Mother   . Diabetes Mother   . Heart attack Father   . Colon cancer Neg Hx   . Crohn's disease Neg Hx   . Ulcerative colitis Neg Hx      Prior to Admission medications   Medication Sig Start Date End Date Taking? Authorizing Provider  amiodarone (PACERONE) 200 MG tablet Take  200 mg by mouth daily.   Yes Historical Provider, MD  B-D ULTRAFINE III SHORT PEN 31G X 8 MM MISC  11/02/13  Yes Historical Provider, MD  COMBIVENT RESPIMAT 20-100 MCG/ACT AERS respimat Inhale 1 puff into the lungs every 6 (six) hours as needed for shortness of breath.  08/09/13  Yes Historical Provider, MD  dexamethasone (DECADRON) 4 MG tablet Take 1 tablet (4 mg total) by mouth 2 (two) times daily with a meal. Check glucose as this medication can elevate it. 11/19/13  Yes Eppie Gibson, MD  diltiazem [facility]) 360 MG 24 hr capsule Take 360 mg by mouth daily. 10/04/13  Yes Historical Provider, MD  docusate sodium 100 MG CAPS Take 100 mg by mouth 2 (two) times daily. 12/05/13  Yes Ophelia Charter, MD  furosemide (LASIX) 40 MG tablet Take 40 mg by mouth daily.   Yes Historical Provider, MD  HYDROcodone-acetaminophen (NORCO) 10-325 MG per tablet Take 1 tablet by mouth every 6 (six) hours as needed (pain).  11/15/13  Yes Historical Provider, MD  Insulin Glargine (LANTUS) 100 UNIT/ML Solostar Pen Inject 40 Units into the skin at bedtime. 01/27/13  Yes Maricela Curet, MD  methadone (DOLOPHINE) 10 MG tablet Take 10 mg by mouth at bedtime as needed for moderate pain.    Yes Historical Provider, MD  metoprolol (LOPRESSOR) 50 MG tablet Take 50 mg by mouth 2 (two) times daily.    Yes Historical Provider, MD  pantoprazole (PROTONIX) 40 MG tablet Take 40 mg by mouth daily.   Yes Historical Provider, MD  promethazine (PHENERGAN) 25 MG tablet Take 1 tablet (25 mg total) by mouth every 6 (six) hours as needed for nausea or vomiting. 11/12/13  Yes Eppie Gibson, MD  tamsulosin (FLOMAX) 0.4 MG CAPS capsule Take 0.4 mg by mouth daily.   Yes Historical Provider, MD  nitroGLYCERIN (NITRODUR - DOSED IN MG/24 HR) 0.2 mg/hr patch Place 0.2 mg onto the skin daily. 08/16/13   Jettie Booze, MD  nitroGLYCERIN (NITROSTAT) 0.4 MG SL tablet Place 1 tablet (0.4 mg total) under the tongue every 5 (five) minutes as needed. Chest pain  08/16/13   Jettie Booze, MD   Physical Exam: Filed Vitals:   12/16/13 1626  BP: 110/51  Pulse: 70  Temp: 98.1 F (36.7 C)  Resp: 20    BP 110/51  Pulse 70  Temp(Src) 98.1 F (36.7 C) (Oral)  Resp 20  Ht 5\' 7"  (1.702 m)  Wt 101.606 kg (224 lb)  BMI 35.08 kg/m2  SpO2 97%  General:  Appears calm and comfortable Eyes: PERRL, normal lids, irises & conjunctiva ENT: grossly normal hearing, lips & tongue Neck: no LAD, masses or thyromegaly Cardiovascular: RRR, no m/r/g. No LE edema. Respiratory: CTA bilaterally, few ronchi.  Normal respiratory effort. Abdomen: soft, ntnd Skin: no rash or induration seen on limited exam Musculoskeletal: grossly normal tone UE and LE Psychiatric: grossly normal mood and affect, speech fluent and appropriate Neurologic: grossly non-focal. Slight weakness in LE          Labs on Admission:  Basic Metabolic Panel: No results found for this basename: NA, K, CL, CO2, GLUCOSE, BUN, CREATININE, CALCIUM, MG, PHOS,  in the last 168 hours Liver Function Tests: No results found for this basename: AST, ALT, ALKPHOS, BILITOT, PROT, ALBUMIN,  in the last 168 hours No results found for this basename: LIPASE, AMYLASE,  in the last 168 hours No results found for this basename: AMMONIA,  in the last 168 hours CBC: No results found for this basename: WBC, NEUTROABS, HGB, HCT, MCV, PLT,  in the last 168 hours Cardiac Enzymes: No results found for this basename: CKTOTAL, CKMB, CKMBINDEX, TROPONINI,  in the last 168 hours  BNP (last 3 results)  Recent Labs  08/16/13 1036  PROBNP 82.0   CBG: No results found for this basename: GLUCAP,  in the last 168 hours  Radiological Exams on Admission: Mr Cervical Spine W Wo Contrast  12/15/2013   CLINICAL DATA:  Cervical spine metastatic disease. Renal cell carcinoma.  EXAM: MRI CERVICAL SPINE WITHOUT AND WITH CONTRAST  TECHNIQUE: Multiplanar and multiecho pulse sequences of the cervical spine, to include the  craniocervical junction and cervicothoracic junction, were obtained according to standard protocol without and with intravenous contrast.  CONTRAST:  44mL MULTIHANCE GADOBENATE DIMEGLUMINE 529 MG/ML IV SOLN  COMPARISON:  MRI of the cervical spine 10/08/2013.  FINDINGS: The lesion within the left CT vertebral body extends into the posterior elements on the left as before. The lesion measures 3.0 x 2.1 x 3.2 cm. Tumor extends into the left C2-3 and C3-4 neural foramina, worse at C3-4.  Asymmetric left-sided facet degenerative changes are again noted. No other focal metastatic lesions are present.  Moderate central canal stenosis is present at C5-6 due to a broad-based disc osteophyte complex. More mild disease is again noted at C6-7.  IMPRESSION: 1. Prominent left-sided metastatic lesion at C3 E within the vertebral body extending into the posterior elements as above. 2. Tumor extends into the left C2-3 and C3-4 neural foramina, worse at C3-4. 3. Moderate spondylosis in the lower cervical spine is stable.   Electronically Signed   By: Lawrence Santiago M.D.   On: 12/15/2013 19:08     Assessment/Plan Active Problems:   CAD (coronary artery disease), native coronary artery   IDDM (insulin dependent diabetes mellitus)   Hypertension associated with diabetes   COPD (chronic obstructive pulmonary disease)   Weakness   1. Renal Cell Carcinoma with C3 T10 Metastasis -patient is being admitted for radiation -he has noted increased weakness and so therefore a concern for impingment and it was decided to start RT now -will increase dosage of his dexamethasone per neurosurgery recs  2. IDDM -will monitor blood glucose -check A1C -continue home medications  3. CAD -stable presently -will continue with home medications  4. COPD -currently he is stable  -will monitor  5. Hypertension -continue with home medications    Code Status: Full Code (must indicate code status--if unknown or must be presumed,  indicate so) Family Communication: None (indicate person spoken with, if applicable, with phone number if by telephone) Disposition Plan: Home (indicate anticipated LOS)  Time spent: 41min  Tayva Easterday A [facility] Pager [phone]  **Disclaimer: This note may have  been dictated with voice recognition software. Similar sounding words can inadvertently be transcribed and this note may contain transcription errors which may not have been corrected upon publication of note.** '

## 2013-12-16 NOTE — Progress Notes (Signed)
RT placed patient on CPAP with a pressure of 8 CmH2O. Water chamber is filled with sterile water for humidification. Patient is tolerating well. RT will continue to monitor as needed.

## 2013-12-17 ENCOUNTER — Ambulatory Visit
Admission: RE | Admit: 2013-12-17 | Discharge: 2013-12-17 | Disposition: A | Discharge status: Home / Self Care | Payer: Medicare Other | Source: Ambulatory Visit | Attending: Radiation Oncology | Admitting: Radiation Oncology

## 2013-12-17 ENCOUNTER — Ambulatory Visit: Payer: Medicare Other

## 2013-12-17 LAB — GLUCOSE, CAPILLARY
GLUCOSE-CAPILLARY: 340 mg/dL — AB (ref 70–99)
Glucose-Capillary: 248 mg/dL — ABNORMAL HIGH (ref 70–99)
Glucose-Capillary: 251 mg/dL — ABNORMAL HIGH (ref 70–99)

## 2013-12-17 LAB — HEMOGLOBIN A1C
HEMOGLOBIN A1C: 7.5 % — AB (ref <5.7)
MEAN PLASMA GLUCOSE: 169 mg/dL — AB (ref <117)

## 2013-12-17 MED ORDER — INSULIN ASPART 100 UNIT/ML ~~LOC~~ SOLN: 0.0000 [IU] | Freq: Three times a day (TID) | SUBCUTANEOUS | Status: DC

## 2013-12-17 MED ORDER — IPRATROPIUM-ALBUTEROL 0.5-2.5 (3) MG/3ML IN SOLN: 3.0000 mL | RESPIRATORY_TRACT | Status: DC | PRN

## 2013-12-17 MED ORDER — INSULIN ASPART 100 UNIT/ML ~~LOC~~ SOLN
0.0000 [IU] | Freq: Three times a day (TID) | SUBCUTANEOUS | Status: DC
  Administered 2013-12-17 – 2013-12-18 (×2): 7 [IU] via SUBCUTANEOUS
  Administered 2013-12-18: 5 [IU] via SUBCUTANEOUS
  Administered 2013-12-18: 9 [IU] via SUBCUTANEOUS
  Administered 2013-12-19: 5 [IU] via SUBCUTANEOUS
  Administered 2013-12-19: 7 [IU] via SUBCUTANEOUS
  Administered 2013-12-19: 3 [IU] via SUBCUTANEOUS

## 2013-12-17 NOTE — Progress Notes (Signed)
PT Cancellation Note  Patient Details Name: [name] MRN: [id number] DOB: 06-Dec-1940   Cancelled Treatment:    Reason Eval/Treat Not Completed: Patient at procedure or test/unavailable--pt currently having radiation therapy. Will check back another day.    Weston Anna, MPT Pager: [phone]

## 2013-12-17 NOTE — Progress Notes (Signed)
Inpatient Diabetes Program Recommendations  AACE/ADA: New Consensus Statement on Inpatient Glycemic Control (2013)  Target Ranges:  Prepandial:   less than 140 mg/dL      Peak postprandial:   less than 180 mg/dL (1-2 hours)      Critically ill patients:  140 - 180 mg/dL  Results for SULIMAN, TERMINI (MRN [id number]) as of 12/17/2013 10:09  Ref. Range 12/17/2013 08:15  Glucose-Capillary Latest Range: 70-99 mg/dL 248 (H)    Inpatient Diabetes Program Recommendations Correction (SSI): add Novolog Moderate scale TID + HS during steroid therapy Thank you  Raoul Pitch BSN, RN,CDE Inpatient Diabetes Coordinator [phone] (team pager)

## 2013-12-17 NOTE — Progress Notes (Signed)
Clinical Social Work Department BRIEF PSYCHOSOCIAL ASSESSMENT 12/17/2013  Patient:  [name],[name]     Account Number:  [id number]     Admit date:  12/16/2013  Clinical Social Worker:  BYRD,SUZANNA, LCSWA  Date/Time:  12/17/2013 02:00 PM  Referred by:  Physician  Date Referred:  12/17/2013 Referred for  SNF Placement   Other Referral:   Interview type:  Patient Other interview type:    PSYCHOSOCIAL DATA Living Status:  ALONE Admitted from facility:   Level of care:   Primary support name:  [name]/friend/[phone] Primary support relationship to patient:  FRIEND Degree of support available:   adequate    CURRENT CONCERNS Current Concerns  Post-Acute Placement   Other Concerns:    SOCIAL WORK ASSESSMENT / PLAN CSW received referral from [facility] CSW, Lauren Mullis regarding SNF placement for short term rehab for duration of pt radiation treatment.    CSW and [facility] CSW, Lauren Mullis met with pt following radiation treatment. CSW introduced self and explained role. Pt shared he lives at home alone. Pt reports limited support and primary support is pt friend, Donne. Pt shared that he has been having falls at home and had increased weakness which brought him to the hospital. CSW discussed concern about pt falls at home and ability to transport to radiation treatment as radiation treatment have just begun. Pt expressed understanding and shared he was hopeful to return home, but wants to ensure that he is able to get treatment and agreeable to exploring short term rehab to get stronger and facility is able to transport pt to radiation. CSW discussed with pt that placement would likely be in [place] given pt transportation needs to radiation. Pt expressed understanding. Pt agreeable to CSW contacting pt friend, Bobby to discuss.    CSW provided supportive counseling as pt discussed that he is hopeful to be able to return to riding motorcycles as this is his passion.    CSW  contacted pt friend, Bobby via telephone and left voice message.    CSW completed FL2 and initiated SNF search to [place] and facilities in [place] where pt is from.    Weekend CSW to follow up with pt re: SNF bed offers and continue attempts to reach pt friend.    CSW to continue to follow.   Assessment/plan status:  Psychosocial Support/Ongoing Assessment of Needs Other assessment/ plan:   discharge planning   Information/referral to community resources:   [place] and [place] SNF list.    PATIENT'S/FAMILY'S RESPONSE TO PLAN OF CARE: Pt alert and oriented x 4. Pt hopeful he would be able to manage at home, but recogonizes that due to weakness and barriers with transportation that short term SNF will be beneficial.    Suzanna Kidd, MSW, LCSW Clinical Social Work [phone] 

## 2013-12-17 NOTE — Progress Notes (Addendum)
Clinical Social Work Department CLINICAL SOCIAL WORK PLACEMENT NOTE 12/17/2013  Patient:  [name], [name]  Account Number:  [id number] Admit date:  12/16/2013  Clinical Social Worker:  Ulyess Blossom  Date/time:  12/17/2013 03:00 PM  Clinical Social Work is seeking post-discharge placement for this patient at the following level of care:   [place]   (*CSW will update this form in Epic as items are completed)   12/17/2013  Patient/family provided with [facility] Department of Clinical Social Work's list of facilities offering this level of care within the geographic area requested by the patient (or if unable, by the patient's family).  12/17/2013  Patient/family informed of their freedom to choose among providers that offer the needed level of care, that participate in Medicare, Medicaid or managed care program needed by the patient, have an available bed and are willing to accept the patient.  12/17/2013  Patient/family informed of [facility]' ownership interest in [facility], as well as of the fact that they are under no obligation to receive care at this facility.  PASARR submitted to EDS on 12/17/2013 PASARR number received on 12/17/2013  FL2 transmitted to all facilities in geographic area requested by pt/family on  12/17/2013 FL2 transmitted to all facilities within larger geographic area on   Patient informed that his/her managed care company has contracts with or will negotiate with  certain facilities, including the following:     Patient/family informed of bed offers received:  12/18/2013 Patient chooses bed at [facility] Physician recommends and patient chooses bed at    Patient to be transferred to  on  [facility] on 12/20/2013 Patient to be transferred to facility by ambulance ([facility]) Patient and family notified of transfer on 12/20/2013 Name of family member notified:  Pt notified at bedside; pt  friend, Butch Penny notified via telephone.  The following physician request were entered in Epic:   Additional Comments:   Alison Murray, MSW, [place] Work [phone]

## 2013-12-17 NOTE — Progress Notes (Signed)
Patient ID: [name], male   DOB: 1941/04/14, 73 y.o.   MRN: [id number] [facility] PROGRESS NOTE  KOUROSH JABLONSKY LGX:[id number] DOB: 10/24/1940 DOA: 12/16/2013 PCP: Maricela Curet, MD  Brief narrative: 73 y.o. 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 Patient states that he has not been able to ambulate very well, has had no bladder control loss. He has not had any syncope. His therapist spoke to neurosurgery and they felt that he needs to start radiation treatments as the spinal canal was looking tight. In addition they suggested increasing his steroid dosages.   Active Problems:   Weakness  - likely secondary to progressive metastatic cancer - plan for radiation therapy and PT evaluation  - may need SNF upon discharge due to recurrent falls  - continue decadron    Metastatic renal cell carcinoma - radiation therapy per rad oncologist  - supportive care with analgesia as needed    CAD (coronary artery disease), native coronary artery - clinically compensated at this time    IDDM (insulin dependent diabetes mellitus) - reasonable inpatient control - continue long acting insulin as per home medical regimen    Hypertension associated with diabetes - continue Lasix and Metoprolol, Cardizem  - BP is on soft side so will monitor closely    COPD (chronic obstructive pulmonary disease) - clinically compensated - maintaining oxygen saturations at target range    Acute renal failure - possibly pre renal  - provided IVF, repeat BMP in AM   Paroxysmal A-fib - in sinus rhythm this AM - continue Amiodarone, Cardizem and Metoprolol   Consultants:  None  Procedures/Studies: Mr Cervical Spine W Wo Contrast   12/15/2013   Prominent left-sided metastatic lesion at C3 E within the vertebral body extending into the posterior elements. Tumor extends  into the left C2-3 and C3-4 neural foramina, worse at C3-4.  Moderate spondylosis in the lower cervical spine is stable.     Antibiotics:  None  Code Status: Full Family Communication: Pt at bedside Disposition Plan: Home when medically stable  HPI/Subjective: No events overnight.   Objective: Filed Vitals:   12/16/13 1626 12/16/13 2251 12/17/13 0437 12/17/13 1449  BP: 110/51 110/67 119/66 109/80  Pulse: 70 76 72 71  Temp: 98.1 F (36.7 C)  98.4 F (36.9 C) 97.7 F (36.5 C)  TempSrc: Oral  Oral Oral  Resp: 20  20 16   Height: 5\' 7"  (1.702 m)     Weight: 101.606 kg (224 lb)     SpO2: 97%  95% 95%    Intake/Output Summary (Last 24 hours) at 12/17/13 1647 Last data filed at 12/17/13 1449  Gross per 24 hour  Intake    580 ml  Output    550 ml  Net     30 ml    Exam:   General:  Pt is alert, follows commands appropriately, not in acute distress  Cardiovascular: Regular rate and rhythm,  no rubs, no gallops  Respiratory: Clear to auscultation bilaterally, no wheezing, no crackles, no rhonchi  Abdomen: Soft, non tender, non distended, bowel sounds present, no guarding  Data Reviewed: Basic Metabolic Panel:  Recent Labs Lab 12/16/13 2014  NA 137  K 3.8  CL 96  CO2 26  GLUCOSE 159*  BUN 17  CREATININE 1.52*  CALCIUM 8.8   Liver Function Tests:  Recent Labs Lab 12/16/13  2014  AST 12  ALT 29  ALKPHOS 113  BILITOT 0.5  PROT 6.5  ALBUMIN 2.8*   No results found for this basename: LIPASE, AMYLASE,  in the last 168 hours No results found for this basename: AMMONIA,  in the last 168 hours CBC:  Recent Labs Lab 12/16/13 2014  WBC 5.4  HGB 12.5*  HCT 37.8*  MCV 78.4  PLT 183   Cardiac Enzymes: No results found for this basename: CKTOTAL, CKMB, CKMBINDEX, TROPONINI,  in the last 168 hours BNP: No components found with this basename: POCBNP,  CBG:  Recent Labs Lab 12/17/13 0815  GLUCAP 248*    Scheduled Meds: . amiodarone  200 mg Oral  Daily  . dexamethasone  8 mg Oral 4 times per day  . [START ON 12/19/2013] dexamethasone  8 mg Oral BID  . diltiazem  360 mg Oral Daily  . docusate sodium  100 mg Oral BID  . folic acid  1 mg Oral Daily  . furosemide  40 mg Oral Daily  . heparin  5,000 Units Subcutaneous 3 times per day  . insulin glargine  40 Units Subcutaneous QHS  . metoprolol  50 mg Oral BID  . multivitamin with minerals  1 tablet Oral Daily  . nitroGLYCERIN  0.2 mg Transdermal QHS  . pantoprazole  40 mg Oral Daily  . tamsulosin  0.4 mg Oral Daily  . thiamine  100 mg Oral Daily   Continuous Infusions: . sodium chloride 50 mL/hr at 12/16/13 2126   Faye Ramsay, MD  [facility] Pager [phone]  If 7PM-7AM, please contact night-coverage [email or web address] Password [email or web address] 12/17/2013, 4:47 PM   LOS: 1 day

## 2013-12-18 LAB — CBC
HCT: 32.7 % — ABNORMAL LOW (ref 39.0–52.0)
HEMOGLOBIN: 10.8 g/dL — AB (ref 13.0–17.0)
MCH: 25.7 pg — ABNORMAL LOW (ref 26.0–34.0)
MCHC: 33 g/dL (ref 30.0–36.0)
MCV: 77.7 fL — ABNORMAL LOW (ref 78.0–100.0)
PLATELETS: 177 10*3/uL (ref 150–400)
RBC: 4.21 MIL/uL — AB (ref 4.22–5.81)
RDW: 15.4 % (ref 11.5–15.5)
WBC: 5.8 10*3/uL (ref 4.0–10.5)

## 2013-12-18 LAB — BASIC METABOLIC PANEL
ANION GAP: 14 (ref 5–15)
BUN: 22 mg/dL (ref 6–23)
CALCIUM: 8.4 mg/dL (ref 8.4–10.5)
CO2: 25 meq/L (ref 19–32)
Chloride: 96 mEq/L (ref 96–112)
Creatinine, Ser: 1.34 mg/dL (ref 0.50–1.35)
GFR calc Af Amer: 60 mL/min — ABNORMAL LOW (ref >90)
GFR, EST NON AFRICAN AMERICAN: 52 mL/min — AB (ref >90)
Glucose, Bld: 297 mg/dL — ABNORMAL HIGH (ref 70–99)
Potassium: 4.1 mEq/L (ref 3.7–5.3)
SODIUM: 135 meq/L — AB (ref 137–147)

## 2013-12-18 LAB — GLUCOSE, CAPILLARY
GLUCOSE-CAPILLARY: 374 mg/dL — AB (ref 70–99)
Glucose-Capillary: 271 mg/dL — ABNORMAL HIGH (ref 70–99)
Glucose-Capillary: 329 mg/dL — ABNORMAL HIGH (ref 70–99)
Glucose-Capillary: 306 mg/dL — ABNORMAL HIGH (ref 70–99)

## 2013-12-18 NOTE — Progress Notes (Signed)
Provided Pt with bed offers.    Pt chooses [facility].  Weekday CSW to follow.  Bernita Raisin, [place] Social Work [phone]

## 2013-12-18 NOTE — Progress Notes (Signed)
Pt placed on CPAP at 8 CMH20 per home settings via [place].  Pt tolerating well at this time, RT to monitor and assess as needed.

## 2013-12-18 NOTE — Evaluation (Signed)
Physical Therapy Evaluation Patient Details Name: [name] MRN: [id number] DOB: Jul 21, 1940 Today's Date: 12/18/2013   History of Present Illness  73 y.o. 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  Plan for pt to start radiation therapy.  Clinical Impression  Pt admitted with above. Pt currently with functional limitations due to the deficits listed below (see PT Problem List).  Pt will benefit from skilled PT to increase their independence and safety with mobility to allow discharge to the venue listed below.  Pt with limited gait today due to weakness and LEs fatigue.  Pt reports poor mobility and falls at home since previous discharge and agreeable to ST-SNF since he lives alone and plans for radiation therapy.     Follow Up Recommendations SNF    Equipment Recommendations  None recommended by PT    Recommendations for Other Services       Precautions / Restrictions Precautions Precautions: Fall;Back      Mobility  Bed Mobility Overal bed mobility: Needs Assistance Bed Mobility: Rolling;Sidelying to Sit Rolling: Supervision Sidelying to sit: Supervision       General bed mobility comments: verbal cue for log roll technique, pt reports he has been performing this at home  Transfers Overall transfer level: Needs assistance Equipment used: Rolling walker (2 wheeled) Transfers: Sit to/from Stand Sit to Stand: Min assist         General transfer comment: verbal cues for hand placement, assist to rise  Ambulation/Gait Ambulation/Gait assistance: Min assist Ambulation Distance (Feet): 36 Feet Assistive device: Rolling walker (2 wheeled) Gait Pattern/deviations: Step-through pattern Gait velocity: decr   General Gait Details: verbal cues for RW placement, observed decreased control of LEs and increased knee extension bil in stance  phase likely to compensate for weakness, pt reports very unsteady, assist to steady  Stairs            Wheelchair Mobility    Modified Rankin (Stroke Patients Only)       Balance                                             Pertinent Vitals/Pain Only c/o weakness, reported back pain since laying in bed however states ambulation and repositioning in recliner will assist ease his pain    Home Living Family/patient expects to be discharged to:: Skilled nursing facility Living Arrangements: Alone                    Prior Function Level of Independence: Independent         Comments: independent prior to back surgery 2 weeks ago, since d/c home has not been mobilizing well and has had falls     Hand Dominance        Extremity/Trunk Assessment               Lower Extremity Assessment: Generalized weakness;RLE deficits/detail;LLE deficits/detail RLE Deficits / Details: grossly 4-/5 throughout LLE Deficits / Details: grossly 3+/5 throughout     Communication   Communication: No difficulties  Cognition Arousal/Alertness: Awake/alert Behavior During Therapy: WFL for tasks assessed/performed Overall Cognitive Status: Within Functional Limits for tasks assessed  General Comments      Exercises        Assessment/Plan    PT Assessment Patient needs continued PT services  PT Diagnosis Difficulty walking;Generalized weakness   PT Problem List Decreased strength;Decreased activity tolerance;Decreased balance;Decreased mobility;Pain;Decreased knowledge of precautions;Decreased coordination  PT Treatment Interventions DME instruction;Gait training;Functional mobility training;Therapeutic activities;Therapeutic exercise;Balance training;Patient/family education;Wheelchair mobility training   PT Goals (Current goals can be found in the Care Plan section) Acute Rehab PT Goals PT Goal Formulation: With  patient Time For Goal Achievement: 12/25/13 Potential to Achieve Goals: Good    Frequency Min 3X/week   Barriers to discharge        Co-evaluation               End of Session Equipment Utilized During Treatment: Gait belt Activity Tolerance: Patient tolerated treatment well Patient left: in chair;with call bell/phone within reach Nurse Communication:  (tech aware pt up in chair and to use call bell when getting up)         Time: 4268-3419 PT Time Calculation (min): 13 min   Charges:   PT Evaluation $Initial PT Evaluation Tier I: 1 Procedure PT Treatments $Gait Training: 8-22 mins   PT G Codes:          Etha Stambaugh,KATHrine E 12/18/2013, 10:47 AM Carmelia Bake, PT, DPT 12/18/2013 Pager: [phone]

## 2013-12-18 NOTE — Progress Notes (Signed)
RT placed patient on CPAP mask with setting of 8 cmH2O. Water chamber filled with sterile water for humidification. Patient is tolerating well. RT will continue to monitor as needed.

## 2013-12-18 NOTE — Progress Notes (Signed)
Spoke with Pt's friend, Butch Penny, and provided bed offers.  Butch Penny stated that, of the 2 offers, her preference would be [facility].  CSW noted this and will discuss with Pt.  CSW thanked Butch Penny for her time.  Bernita Raisin, [place] Social Work [phone]

## 2013-12-18 NOTE — Progress Notes (Signed)
Patient ID: [name], male   DOB: [birth year], 73 y.o.   MRN: [id number]  [facility] PROGRESS NOTE  [name]:[id number] DOB: December 12, 1940 DOA: 12/16/2013 PCP: Maricela Curet, MD  Brief narrative:  73 y.o. 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 Patient states that he has not been able to ambulate very well, has had no bladder control loss. He has not had any syncope. His therapist spoke to neurosurgery and they felt that he needs to start radiation treatments as the spinal canal was looking tight. In addition they suggested increasing his steroid dosages.   Active Problems:  Weakness  - likely secondary to progressive metastatic cancer  - tolerating radiation therapy - plan to d/c SNF Monday  - continue decadron  Metastatic renal cell carcinoma  - radiation therapy per rad oncologist  - supportive care with analgesia as needed - encouraged ambulation as tolerating with assistance   CAD (coronary artery disease), native coronary artery  - clinically compensated at this time  IDDM (insulin dependent diabetes mellitus)  - reasonable inpatient control  - continue long acting insulin as per home medical regimen  Hypertension associated with diabetes  - continue Lasix and Metoprolol, Cardizem  - reasonable BP control  COPD (chronic obstructive pulmonary disease)  - clinically compensated  - maintaining oxygen saturations at target range  Acute renal failure  - possibly pre renal  - provided IVF, Cr is now WNL  - repeat BMP in AM Paroxysmal A-fib  - in sinus rhythm this AM  - continue Amiodarone, Cardizem and Metoprolol  Acute blood loss anemia - likely dilutional - no signs of active bleeding - repeat CBC in AM  Consultants:  None Procedures/Studies:  Mr Cervical Spine W Wo Contrast 12/15/2013 Prominent left-sided  metastatic lesion at C3 E within the vertebral body extending into the posterior elements. Tumor extends into the left C2-3 and C3-4 neural foramina, worse at C3-4. Moderate spondylosis in the lower cervical spine is stable.  Antibiotics:  None  Code Status: Full  Family Communication: Pt at bedside  Disposition Plan: SNF Monday   HPI/Subjective: No events overnight.   Objective: Filed Vitals:   12/17/13 0437 12/17/13 1449 12/17/13 2055 12/18/13 0454  BP: 119/66 109/80 118/65 122/59  Pulse: 72 71 66 58  Temp: 98.4 F (36.9 C) 97.7 F (36.5 C) 97.6 F (36.4 C) 98 F (36.7 C)  TempSrc: Oral Oral Oral Oral  Resp: 20 16 16 16   Height:      Weight:      SpO2: 95% 95% 96% 93%    Intake/Output Summary (Last 24 hours) at 12/18/13 1024 Last data filed at 12/18/13 0455  Gross per 24 hour  Intake   1440 ml  Output   2300 ml  Net   -860 ml    Exam:   General:  Pt is alert, follows commands appropriately, not in acute distress  Cardiovascular: Regular rate and rhythm, no rubs, no gallops  Respiratory: Clear to auscultation bilaterally, no wheezing, no crackles, no rhonchi  Abdomen: Soft, non tender, non distended, bowel sounds present, no guarding  Extremities: No edema, pulses DP and PT palpable bilaterally  Data Reviewed: Basic Metabolic Panel:  Recent Labs Lab 12/16/13 2014 12/18/13 0005  NA 137 135*  K 3.8 4.1  CL 96 96  CO2 26 25  GLUCOSE 159* 297*  BUN  17 22  CREATININE 1.52* 1.34  CALCIUM 8.8 8.4   Liver Function Tests:  Recent Labs Lab 12/16/13 2014  AST 12  ALT 29  ALKPHOS 113  BILITOT 0.5  PROT 6.5  ALBUMIN 2.8*   CBC:  Recent Labs Lab 12/16/13 2014 12/18/13 0005  WBC 5.4 5.8  HGB 12.5* 10.8*  HCT 37.8* 32.7*  MCV 78.4 77.7*  PLT 183 177   CBG:  Recent Labs Lab 12/17/13 0815 12/17/13 1717 12/17/13 2059  GLUCAP 248* 340* 251*   Scheduled Meds: . amiodarone  200 mg Oral Daily  . dexamethasone  8 mg Oral 4 times per day  .  [START ON 12/19/2013] dexamethasone  8 mg Oral BID  . diltiazem  360 mg Oral Daily  . docusate sodium  100 mg Oral BID  . folic acid  1 mg Oral Daily  . furosemide  40 mg Oral Daily  . heparin  5,000 Units Subcutaneous 3 times per day  . insulin aspart  0-9 Units Subcutaneous TID WC  . insulin glargine  40 Units Subcutaneous QHS  . metoprolol  50 mg Oral BID  . multivitamin with minerals  1 tablet Oral Daily  . nitroGLYCERIN  0.2 mg Transdermal QHS  . pantoprazole  40 mg Oral Daily  . tamsulosin  0.4 mg Oral Daily  . thiamine  100 mg Oral Daily   Continuous Infusions:    Faye Ramsay, MD  [facility] Pager [phone]  If 7PM-7AM, please contact night-coverage [email or web address] Password [email or web address] 12/18/2013, 10:24 AM   LOS: 2 days

## 2013-12-19 LAB — GLUCOSE, CAPILLARY
Glucose-Capillary: 233 mg/dL — ABNORMAL HIGH (ref 70–99)
Glucose-Capillary: 271 mg/dL — ABNORMAL HIGH (ref 70–99)
Glucose-Capillary: 346 mg/dL — ABNORMAL HIGH (ref 70–99)
Glucose-Capillary: 246 mg/dL — ABNORMAL HIGH (ref 70–99)

## 2013-12-19 LAB — BASIC METABOLIC PANEL
Anion gap: 12 (ref 5–15)
BUN: 31 mg/dL — AB (ref 6–23)
CO2: 27 meq/L (ref 19–32)
CREATININE: 1.47 mg/dL — AB (ref 0.50–1.35)
Calcium: 8.4 mg/dL (ref 8.4–10.5)
Chloride: 96 mEq/L (ref 96–112)
GFR calc non Af Amer: 47 mL/min — ABNORMAL LOW (ref >90)
GFR, EST AFRICAN AMERICAN: 54 mL/min — AB (ref >90)
Glucose, Bld: 285 mg/dL — ABNORMAL HIGH (ref 70–99)
Potassium: 3.8 mEq/L (ref 3.7–5.3)
Sodium: 135 mEq/L — ABNORMAL LOW (ref 137–147)

## 2013-12-19 LAB — CBC
HCT: 34.1 % — ABNORMAL LOW (ref 39.0–52.0)
Hemoglobin: 11.4 g/dL — ABNORMAL LOW (ref 13.0–17.0)
MCH: 25.6 pg — AB (ref 26.0–34.0)
MCHC: 33.4 g/dL (ref 30.0–36.0)
MCV: 76.6 fL — ABNORMAL LOW (ref 78.0–100.0)
Platelets: 181 10*3/uL (ref 150–400)
RBC: 4.45 MIL/uL (ref 4.22–5.81)
RDW: 15.3 % (ref 11.5–15.5)
WBC: 7.7 10*3/uL (ref 4.0–10.5)

## 2013-12-19 MED ORDER — INSULIN ASPART 100 UNIT/ML ~~LOC~~ SOLN
0.0000 [IU] | Freq: Three times a day (TID) | SUBCUTANEOUS | Status: DC
  Administered 2013-12-20: 3 [IU] via SUBCUTANEOUS
  Administered 2013-12-20: 1 [IU] via SUBCUTANEOUS

## 2013-12-19 MED ORDER — FUROSEMIDE 20 MG PO TABS
20.0000 mg | ORAL_TABLET | Freq: Every day | ORAL | Status: DC
  Administered 2013-12-20: 20 mg via ORAL
  Filled 2013-12-19: qty 1

## 2013-12-19 MED ORDER — INSULIN ASPART 100 UNIT/ML ~~LOC~~ SOLN
0.0000 [IU] | Freq: Every day | SUBCUTANEOUS | Status: DC
  Administered 2013-12-19: 2 [IU] via SUBCUTANEOUS

## 2013-12-19 NOTE — Progress Notes (Signed)
Pt placed on CPAP at 8 CMH20 per home settings via [place].  Pt tolerating well at this time, RT to monitor and assess as needed.

## 2013-12-19 NOTE — Progress Notes (Signed)
Patient ID: [name], male   DOB: Sep 11, 1940, 73 y.o.   MRN: [id number]  [facility] PROGRESS NOTE  NAS WAFER HWE:[id number] DOB: 08/10/1940 DOA: 12/16/2013 PCP: Maricela Curet, MD  Brief narrative:  73 y.o. 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 Patient states that he has not been able to ambulate very well, has had no bladder control loss. He has not had any syncope. His therapist spoke to neurosurgery and they felt that he needs to start radiation treatments as the spinal canal was looking tight. In addition they suggested increasing his steroid dosages.   Active Problems:  Weakness  - likely secondary to progressive metastatic cancer  - tolerating radiation therapy  - plan to d/c SNF Monday  - continue decadron  Metastatic renal cell carcinoma  - radiation therapy per rad oncologist  - supportive care with analgesia as needed  - encouraged ambulation as tolerating with assistance  CAD (coronary artery disease), native coronary artery  - clinically compensated at this time  IDDM (insulin dependent diabetes mellitus)  - reasonable inpatient control  - continue long acting insulin as per home medical regimen  Hypertension associated with diabetes  - continue Lasix and Metoprolol, Cardizem  - reasonable BP control  COPD (chronic obstructive pulmonary disease)  - clinically compensated  - maintaining oxygen saturations at target range  Acute renal failure  - possibly pre renal  - provided IVF, Cr is up over the past 24 hours  - repeat BMP in AM  Paroxysmal A-fib  - in sinus rhythm this AM  - continue Amiodarone, Cardizem and Metoprolol  Acute blood loss anemia  - likely dilutional  - no signs of active bleeding  - repeat CBC in AM   Consultants:  None Procedures/Studies:  Mr Cervical Spine W Wo Contrast 12/15/2013  Prominent left-sided metastatic lesion at C3 E within the vertebral body extending into the posterior elements. Tumor extends into the left C2-3 and C3-4 neural foramina, worse at C3-4. Moderate spondylosis in the lower cervical spine is stable.  Antibiotics:  None  Code Status: Full  Family Communication: Pt at bedside  Disposition Plan: SNF Monday   HPI/Subjective: No events overnight.   Objective: Filed Vitals:   12/18/13 1451 12/18/13 2114 12/18/13 2320 12/19/13 0512  BP: 106/60 117/58  125/66  Pulse: 65 62 64 55  Temp: 97.9 F (36.6 C) 97.7 F (36.5 C)  97.9 F (36.6 C)  TempSrc: Oral Oral  Oral  Resp: 18 18  16   Height:      Weight:      SpO2: 97% 97%  96%    Intake/Output Summary (Last 24 hours) at 12/19/13 1052 Last data filed at 12/19/13 0512  Gross per 24 hour  Intake   1450 ml  Output   2050 ml  Net   -600 ml    Exam:   General:  Pt is alert, follows commands appropriately, not in acute distress  Cardiovascular: Regular rate and rhythm, S1/S2, no murmurs, no rubs, no gallops  Respiratory: Clear to auscultation bilaterally, no wheezing, no crackles, no rhonchi  Abdomen: Soft, non tender, non distended, bowel sounds present, no guarding  Data Reviewed: Basic Metabolic Panel:  Recent Labs Lab 12/16/13 2014 12/18/13 0005 12/19/13 0408  NA 137 135* 135*  K 3.8 4.1 3.8  CL 96 96 96  CO2 26 25 27  GLUCOSE 159* 297* 285*  BUN 17 22 31*  CREATININE 1.52* 1.34 1.47*  CALCIUM 8.8 8.4 8.4   Liver Function Tests:  Recent Labs Lab 12/16/13 2014  AST 12  ALT 29  ALKPHOS 113  BILITOT 0.5  PROT 6.5  ALBUMIN 2.8*   CBC:  Recent Labs Lab 12/16/13 2014 12/18/13 0005 12/19/13 0408  WBC 5.4 5.8 7.7  HGB 12.5* 10.8* 11.4*  HCT 37.8* 32.7* 34.1*  MCV 78.4 77.7* 76.6*  PLT 183 177 181   CBG:  Recent Labs Lab 12/18/13 0744 12/18/13 1219 12/18/13 1658 12/18/13 2113 12/19/13 0736  GLUCAP 271* 374* 329* 306* 233*   Scheduled Meds: .  amiodarone  200 mg Oral Daily  . dexamethasone  8 mg Oral 4 times per day  . dexamethasone  8 mg Oral BID  . diltiazem  360 mg Oral Daily  . docusate sodium  100 mg Oral BID  . folic acid  1 mg Oral Daily  . furosemide  40 mg Oral Daily  . heparin  5,000 Units Subcutaneous 3 times per day  . insulin aspart  0-9 Units Subcutaneous TID WC  . insulin glargine  40 Units Subcutaneous QHS  . metoprolol  50 mg Oral BID  . multivitamin with minerals  1 tablet Oral Daily  . nitroGLYCERIN  0.2 mg Transdermal QHS  . pantoprazole  40 mg Oral Daily  . tamsulosin  0.4 mg Oral Daily  . thiamine  100 mg Oral Daily   Continuous Infusions:   Faye Ramsay, MD  [facility] Pager [phone]  If 7PM-7AM, please contact night-coverage [email or web address] Password [email or web address] 12/19/2013, 10:52 AM   LOS: 3 days

## 2013-12-20 ENCOUNTER — Encounter: Payer: Self-pay | Admitting: Radiation Oncology

## 2013-12-20 ENCOUNTER — Ambulatory Visit
Admission: RE | Admit: 2013-12-20 | Discharge: 2013-12-20 | Disposition: A | Discharge status: Home / Self Care | Payer: Medicare Other | Source: Ambulatory Visit | Attending: Radiation Oncology | Admitting: Radiation Oncology

## 2013-12-20 ENCOUNTER — Ambulatory Visit: Payer: Medicare Other

## 2013-12-20 ENCOUNTER — Inpatient Hospital Stay
Admit: 2013-12-20 | Discharge: 2013-12-20 | Disposition: A | Discharge status: Home / Self Care | Payer: Medicare Other | Attending: Radiation Oncology | Admitting: Radiation Oncology

## 2013-12-20 DIAGNOSIS — C7952 Secondary malignant neoplasm of bone marrow: Secondary | ICD-10-CM

## 2013-12-20 DIAGNOSIS — C7951 Secondary malignant neoplasm of bone: Secondary | ICD-10-CM | POA: Insufficient documentation

## 2013-12-20 LAB — CBC
HEMATOCRIT: 34.3 % — AB (ref 39.0–52.0)
HEMOGLOBIN: 11.6 g/dL — AB (ref 13.0–17.0)
MCH: 25.8 pg — ABNORMAL LOW (ref 26.0–34.0)
MCHC: 33.8 g/dL (ref 30.0–36.0)
MCV: 76.2 fL — ABNORMAL LOW (ref 78.0–100.0)
Platelets: 162 10*3/uL (ref 150–400)
RBC: 4.5 MIL/uL (ref 4.22–5.81)
RDW: 15.2 % (ref 11.5–15.5)
WBC: 6.7 10*3/uL (ref 4.0–10.5)

## 2013-12-20 LAB — GLUCOSE, CAPILLARY
Glucose-Capillary: 148 mg/dL — ABNORMAL HIGH (ref 70–99)
Glucose-Capillary: 224 mg/dL — ABNORMAL HIGH (ref 70–99)

## 2013-12-20 LAB — BASIC METABOLIC PANEL
ANION GAP: 13 (ref 5–15)
BUN: 30 mg/dL — ABNORMAL HIGH (ref 6–23)
CO2: 30 meq/L (ref 19–32)
Calcium: 8.5 mg/dL (ref 8.4–10.5)
Chloride: 95 mEq/L — ABNORMAL LOW (ref 96–112)
Creatinine, Ser: 1.18 mg/dL (ref 0.50–1.35)
GFR calc Af Amer: 70 mL/min — ABNORMAL LOW (ref >90)
GFR calc non Af Amer: 61 mL/min — ABNORMAL LOW (ref >90)
Glucose, Bld: 205 mg/dL — ABNORMAL HIGH (ref 70–99)
POTASSIUM: 3.4 meq/L — AB (ref 3.7–5.3)
SODIUM: 138 meq/L (ref 137–147)

## 2013-12-20 MED ORDER — IPRATROPIUM-ALBUTEROL 0.5-2.5 (3) MG/3ML IN SOLN: 3.0000 mL | RESPIRATORY_TRACT | Status: DC | PRN

## 2013-12-20 MED ORDER — HYDROCODONE-ACETAMINOPHEN 10-325 MG PO TABS: 1.0000 | ORAL_TABLET | Freq: Four times a day (QID) | ORAL | Status: DC | PRN

## 2013-12-20 MED ORDER — METHADONE HCL 10 MG PO TABS: 10.0000 mg | ORAL_TABLET | Freq: Every evening | ORAL | Status: DC | PRN

## 2013-12-20 NOTE — Progress Notes (Signed)
CBG 246. Paged NP on call and got order for hs SSI coverage.

## 2013-12-20 NOTE — Progress Notes (Signed)
   Weekly Management Note:  outpatient Current Dose:  9 Gy  Projected Dose: 30 Gy  C and T spine bone metastases  Narrative:  The patient presents for routine under treatment assessment.  CBCT/MVCT images/Port film x-rays were reviewed.  The chart was checked. Patient reports some trouble with memory.  He is alone in wheelchair. To be discharged to rehab today, I believe. He says LE numbness is stable.  He feels that pain in left neck/arm is well controlled with meds.  Physical Findings:  vitals were not taken for this visit. NAD, in wheelchair, no oral thrush  CBC    Component Value Date/Time   WBC 6.7 12/20/2013 0410   WBC 6.3 11/10/2013 1039   RBC 4.50 12/20/2013 0410   RBC 4.76 11/10/2013 1039   RBC 3.31* 09/28/2012 2225   HGB 11.6* 12/20/2013 0410   HGB 12.3* 11/10/2013 1039   HCT 34.3* 12/20/2013 0410   HCT 37.9* 11/10/2013 1039   PLT 162 12/20/2013 0410   PLT 248 11/10/2013 1039   MCV 76.2* 12/20/2013 0410   MCV 79.6 11/10/2013 1039   MCH 25.8* 12/20/2013 0410   MCH 25.8* 11/10/2013 1039   MCHC 33.8 12/20/2013 0410   MCHC 32.4 11/10/2013 1039   RDW 15.2 12/20/2013 0410   RDW 15.9* 11/10/2013 1039   LYMPHSABS 1.0 11/10/2013 1039   LYMPHSABS 1.1 10/29/2012 1353   MONOABS 0.7 11/10/2013 1039   MONOABS 0.6 10/29/2012 1353   EOSABS 0.1 11/10/2013 1039   EOSABS 0.2 10/29/2012 1353   BASOSABS 0.1 11/10/2013 1039   BASOSABS 0.1 10/29/2012 1353     CMP     Component Value Date/Time   NA 138 12/20/2013 0410   NA 143 11/10/2013 1039   K 3.4* 12/20/2013 0410   K 4.1 11/10/2013 1039   CL 95* 12/20/2013 0410   CO2 30 12/20/2013 0410   CO2 32* 11/10/2013 1039   GLUCOSE 205* 12/20/2013 0410   GLUCOSE 136 11/10/2013 1039   BUN 30* 12/20/2013 0410   BUN 15.5 11/10/2013 1039   CREATININE 1.18 12/20/2013 0410   CREATININE 1.4* 11/10/2013 1039   CALCIUM 8.5 12/20/2013 0410   CALCIUM 9.1 11/10/2013 1039   CALCIUM 8.1* 08/26/2012 1312   PROT 6.5 12/16/2013 2014   PROT 6.8 11/10/2013 1039   ALBUMIN 2.8* 12/16/2013 2014   ALBUMIN 3.0*  11/10/2013 1039   AST 12 12/16/2013 2014   AST 13 11/10/2013 1039   ALT 29 12/16/2013 2014   ALT 23 11/10/2013 1039   ALKPHOS 113 12/16/2013 2014   ALKPHOS 93 11/10/2013 1039   BILITOT 0.5 12/16/2013 2014   BILITOT 0.46 11/10/2013 1039   GFRNONAA 61* 12/20/2013 0410   GFRAA 70* 12/20/2013 0410     Impression:  The patient is tolerating urgent radiotherapy for decompensating symptoms in spite of thoracic spine surgery for cord compression. numbness in legs stable.  Plan:  Continue radiotherapy as planned. Message sent to Dr Doyle Askew to clarify discharge dose of Decadron.  -----------------------------------  Eppie Gibson, MD

## 2013-12-20 NOTE — Progress Notes (Addendum)
Pt for discharge to [facility].   CSW facilitated pt discharge needs including contacting facility, faxing pt discharge information via [facility], discussing with pt at bedside, notifying pt friend, Butch Penny via telephone, providing RN phone number to provide report, and arranging ambulance transportation for pt via [facility] to [facility]. (Service Request ID#: [id number]).  Pt eager to go rehab in order to get stronger and have reliable transportation for the remainder of radiation treatment.  No further social work needs identified at this time.  CSW signing off.   Alison Murray, MSW, [place] Work [phone]

## 2013-12-20 NOTE — Discharge Summary (Signed)
Physician Discharge Summary  [name] ZCH:[id number] DOB: [birth year] DOA: 12/16/2013  PCP: Maricela Curet, MD  Admit date: 12/16/2013 Discharge date: 12/20/2013  Recommendations for Outpatient Follow-up:  1. Pt will need to follow up with PCP in 2-3 weeks post discharge 2. Please obtain BMP to evaluate electrolytes and kidney function 3. Please also check CBC to evaluate Hg and Hct levels 4. Provide CPAP at night time, at 8 CMH20 per home settings via [place].  Discharge Diagnoses:  LE weakness, recurrent falls  Active Problems:   CAD (coronary artery disease), native coronary artery   IDDM (insulin dependent diabetes mellitus)   Hypertension associated with diabetes   COPD (chronic obstructive pulmonary disease)   Weakness  Discharge Condition: Stable  Diet recommendation: Heart healthy diet discussed in details   Brief narrative:  73 y.o. 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 Patient states that he has not been able to ambulate very well, has had no bladder control loss. He has not had any syncope. His therapist spoke to neurosurgery and they felt that he needs to start radiation treatments as the spinal canal was looking tight. In addition they suggested increasing his steroid dosages.   Active Problems:  Weakness  - likely secondary to progressive metastatic cancer  - tolerating radiation therapy  - plan to d/c SNF today after radiation therapy  - continue decadron  Metastatic renal cell carcinoma  - radiation therapy per rad oncologist  - supportive care with analgesia as needed  - encouraged ambulation as tolerating with assistance  CAD (coronary artery disease), native coronary artery  - clinically compensated at this time  IDDM (insulin dependent diabetes mellitus)  - reasonable inpatient control  - continue long acting insulin  as per home medical regimen  Hypertension associated with diabetes  - continue Lasix and Metoprolol, Cardizem  - reasonable BP control  COPD (chronic obstructive pulmonary disease)  - clinically compensated  - maintaining oxygen saturations at target range  Acute renal failure  - possibly pre renal  - provided IVF, Cr is now WNL  Paroxysmal A-fib  - in sinus rhythm this AM  - continue Amiodarone, Cardizem and Metoprolol  Acute blood loss anemia  - likely dilutional  - no signs of active bleeding   Consultants:  None Procedures/Studies:  Mr Cervical Spine W Wo Contrast 12/15/2013 Prominent left-sided metastatic lesion at C3 E within the vertebral body extending into the posterior elements. Tumor extends into the left C2-3 and C3-4 neural foramina, worse at C3-4. Moderate spondylosis in the lower cervical spine is stable.  Antibiotics:  None  Code Status: Full  Family Communication: Pt at bedside  Disposition Plan: SNF   Discharge Exam: Filed Vitals:   12/20/13 0603  BP: 126/78  Pulse: 55  Temp: 97.9 F (36.6 C)  Resp: 16   Filed Vitals:   12/19/13 0512 12/19/13 1344 12/19/13 2116 12/20/13 0603  BP: 125/66 146/66 115/90 126/78  Pulse: 55 63 60 55  Temp: 97.9 F (36.6 C) 98.2 F (36.8 C) 97.8 F (36.6 C) 97.9 F (36.6 C)  TempSrc: Oral Oral Oral Oral  Resp: 16 18 18 16   Height:      Weight:      SpO2: 96% 98% 96% 96%    General: Pt is alert, follows commands appropriately, not in acute distress Cardiovascular: Regular rate and rhythm, S1/S2 +, no  murmurs, no rubs, no gallops Respiratory: Clear to auscultation bilaterally, no wheezing, no crackles, no rhonchi Abdominal: Soft, non tender, non distended, bowel sounds +, no guarding Extremities: no edema, no cyanosis, pulses palpable bilaterally DP and PT Neuro: Grossly nonfocal  Discharge Instructions  Discharge Instructions   Diet - low sodium heart healthy    Complete by:  As directed      Increase activity  slowly    Complete by:  As directed             Medication List         amiodarone 200 MG tablet  Commonly known as:  PACERONE  Take 200 mg by mouth daily.     B-D ULTRAFINE III SHORT PEN 31G X 8 MM Misc  Generic drug:  Insulin Pen Needle     COMBIVENT RESPIMAT 20-100 MCG/ACT Aers respimat  Generic drug:  Ipratropium-Albuterol  Inhale 1 puff into the lungs every 6 (six) hours as needed for shortness of breath.     ipratropium-albuterol 0.5-2.5 (3) MG/3ML Soln  Commonly known as:  DUONEB  Take 3 mLs by nebulization every 4 (four) hours as needed.     dexamethasone 4 MG tablet  Commonly known as:  DECADRON  Take 1 tablet (4 mg total) by mouth 2 (two) times daily with a meal. Check glucose as this medication can elevate it.     diltiazem 360 MG 24 hr capsule  Commonly known as:  TIAZAC  Take 360 mg by mouth daily.     DSS 100 MG Caps  Take 100 mg by mouth 2 (two) times daily.     furosemide 40 MG tablet  Commonly known as:  LASIX  Take 40 mg by mouth daily.     HYDROcodone-acetaminophen 10-325 MG per tablet  Commonly known as:  NORCO  Take 1 tablet by mouth every 6 (six) hours as needed (pain).     Insulin Glargine 100 UNIT/ML Solostar Pen  Commonly known as:  LANTUS  Inject 40 Units into the skin at bedtime.     methadone 10 MG tablet  Commonly known as:  DOLOPHINE  Take 1 tablet (10 mg total) by mouth at bedtime as needed for moderate pain.     metoprolol 50 MG tablet  Commonly known as:  LOPRESSOR  Take 50 mg by mouth 2 (two) times daily.     nitroGLYCERIN 0.4 MG SL tablet  Commonly known as:  NITROSTAT  Place 1 tablet (0.4 mg total) under the tongue every 5 (five) minutes as needed. Chest pain     nitroGLYCERIN 0.2 mg/hr patch  Commonly known as:  NITRODUR - Dosed in mg/24 hr  Place 0.2 mg onto the skin daily.     pantoprazole 40 MG tablet  Commonly known as:  PROTONIX  Take 40 mg by mouth daily.     promethazine 25 MG tablet  Commonly known as:   PHENERGAN  Take 1 tablet (25 mg total) by mouth every 6 (six) hours as needed for nausea or vomiting.     tamsulosin 0.4 MG Caps capsule  Commonly known as:  FLOMAX  Take 0.4 mg by mouth daily.           Follow-up Information   Schedule an appointment as soon as possible for a visit with Maricela Curet, MD.   Specialty:  Internal Medicine   Contact information:   [place] [address] [phone]       Follow up with  Faye Ramsay, MD. (As needed, If symptoms worsen, call my cell [phone])    Specialty:  Internal Medicine   Contact information:   [address] [place] [address] [phone]        The results of significant diagnostics from this hospitalization (including imaging, microbiology, ancillary and laboratory) are listed below for reference.     Microbiology: No results found for this or any previous visit (from the past 240 hour(s)).   Labs: Basic Metabolic Panel:  Recent Labs Lab 12/16/13 2014 12/18/13 0005 12/19/13 0408 12/20/13 0410  NA 137 135* 135* 138  K 3.8 4.1 3.8 3.4*  CL 96 96 96 95*  CO2 26 25 27 30   GLUCOSE 159* 297* 285* 205*  BUN 17 22 31* 30*  CREATININE 1.52* 1.34 1.47* 1.18  CALCIUM 8.8 8.4 8.4 8.5   Liver Function Tests:  Recent Labs Lab 12/16/13 2014  AST 12  ALT 29  ALKPHOS 113  BILITOT 0.5  PROT 6.5  ALBUMIN 2.8*   No results found for this basename: LIPASE, AMYLASE,  in the last 168 hours No results found for this basename: AMMONIA,  in the last 168 hours CBC:  Recent Labs Lab 12/16/13 2014 12/18/13 0005 12/19/13 0408 12/20/13 0410  WBC 5.4 5.8 7.7 6.7  HGB 12.5* 10.8* 11.4* 11.6*  HCT 37.8* 32.7* 34.1* 34.3*  MCV 78.4 77.7* 76.6* 76.2*  PLT 183 177 181 162   Cardiac Enzymes: No results found for this basename: CKTOTAL, CKMB, CKMBINDEX, TROPONINI,  in the last 168 hours BNP: BNP (last 3 results)  Recent Labs  08/16/13 1036  PROBNP 82.0   CBG:  Recent  Labs Lab 12/19/13 0736 12/19/13 1201 12/19/13 1610 12/19/13 2106 12/20/13 0739  GLUCAP 233* 271* 346* 246* 148*     SIGNED: Time coordinating discharge: Over 30 minutes  Faye Ramsay, MD  [facility] 12/20/2013, 9:32 AM Pager [phone]  If 7PM-7AM, please contact night-coverage [email or web address] Password [email or web address]

## 2013-12-20 NOTE — Care Management Note (Signed)
    Page 1 of 1   12/20/2013     2:13:51 PM CARE MANAGEMENT NOTE 12/20/2013  Patient:  [name], [name]   Account Number:  [id number]  Date Initiated:  12/17/2013  Documentation initiated by:  McGIBBONEY,COOKIE  Subjective/Objective Assessment:   pt admitted with cervical pain, numbness, falls     Action/Plan:   from home alone   Anticipated DC Date:  12/20/2013   Anticipated DC Plan:  SKILLED NURSING FACILITY  In-house referral  Clinical Social Worker      DC Planning Services  CM consult      Choice offered to / List presented to:             Status of service:  Completed, signed off Medicare Important Message given?  NA - LOS <3 / Initial given by admissions (If response is "NO", the following Medicare IM given date fields will be blank) Date Medicare IM given:  12/20/2013 Medicare IM given by:  [facility] Date Additional Medicare IM given:   Additional Medicare IM given by:    Discharge Disposition:  [place]  Per UR Regulation:  Reviewed for med. necessity/level of care/duration of stay  If discussed at [place] of Stay Meetings, dates discussed:    Comments:  12/20/13 [facility] RN,BSN NCM [phone] D/C SNF.  12/17/13 [place], RN, BSN Chart reviewed.

## 2013-12-20 NOTE — Discharge Instructions (Signed)

## 2013-12-21 ENCOUNTER — Ambulatory Visit
Admission: RE | Admit: 2013-12-21 | Discharge: 2013-12-21 | Disposition: A | Discharge status: Home / Self Care | Payer: Medicare Other | Source: Ambulatory Visit | Attending: Radiation Oncology | Admitting: Radiation Oncology

## 2013-12-21 ENCOUNTER — Ambulatory Visit: Payer: Medicare Other | Admitting: Radiation Oncology

## 2013-12-21 ENCOUNTER — Ambulatory Visit: Payer: Medicare Other

## 2013-12-21 ENCOUNTER — Encounter: Payer: Self-pay | Admitting: Internal Medicine

## 2013-12-21 DIAGNOSIS — C649 Malignant neoplasm of unspecified kidney, except renal pelvis: Secondary | ICD-10-CM | POA: Diagnosis not present

## 2013-12-21 DIAGNOSIS — Z510 Encounter for antineoplastic radiation therapy: Secondary | ICD-10-CM | POA: Diagnosis present

## 2013-12-21 DIAGNOSIS — C7951 Secondary malignant neoplasm of bone: Secondary | ICD-10-CM | POA: Diagnosis not present

## 2013-12-21 NOTE — Progress Notes (Signed)
Patient ID: [name], male   DOB: 13-Oct-1940, 73 y.o.   MRN: [id number]  Location:  [facility] SNF Provider:  Jonelle Sidle L. Mariea Clonts, D.O., C.M.D.  Code Status: Full  Chief Complaint  Patient presents with  . Medical Management of Chronic Issues    new admission    HPI:   Review of Systems:   Medications: Patient's Medications  New Prescriptions   No medications on file  Previous Medications   AMIODARONE (PACERONE) 200 MG TABLET    Take 200 mg by mouth daily. For a-fib   B-D ULTRAFINE III SHORT PEN 31G X 8 MM MISC       COMBIVENT RESPIMAT 20-100 MCG/ACT AERS RESPIMAT    Inhale 1 puff into the lungs every 6 (six) hours as needed for shortness of breath.    DEXAMETHASONE (DECADRON) 4 MG TABLET    Take 1 tablet (4 mg total) by mouth 2 (two) times daily with a meal. Check glucose as this medication can elevate it.   DILTIAZEM (TIAZAC) 360 MG 24 HR CAPSULE    Take 360 mg by mouth daily.   DOCUSATE SODIUM 100 MG CAPS    Take 100 mg by mouth 2 (two) times daily.   FUROSEMIDE (LASIX) 40 MG TABLET    Take 40 mg by mouth daily.   HYDROCODONE-ACETAMINOPHEN (NORCO) 10-325 MG PER TABLET    Take 1 tablet by mouth every 6 (six) hours as needed (pain).   INSULIN GLARGINE (LANTUS) 100 UNIT/ML SOLOSTAR PEN    Inject 40 Units into the skin at bedtime.   IPRATROPIUM-ALBUTEROL (DUONEB) 0.5-2.5 (3) MG/3ML SOLN    Take 3 mLs by nebulization every 4 (four) hours as needed.   METHADONE (DOLOPHINE) 10 MG TABLET    Take 1 tablet (10 mg total) by mouth at bedtime as needed for moderate pain.   METOPROLOL (LOPRESSOR) 50 MG TABLET    Take 50 mg by mouth 2 (two) times daily.    NITROGLYCERIN (NITRODUR - DOSED IN MG/24 HR) 0.2 MG/HR PATCH    Place 0.2 mg onto the skin daily.   NITROGLYCERIN (NITROSTAT) 0.4 MG SL TABLET    Place 1 tablet (0.4 mg total) under the tongue every 5 (five) minutes as needed. Chest pain   PANTOPRAZOLE (PROTONIX) 40 MG TABLET    Take 40 mg by mouth daily.   PROMETHAZINE  (PHENERGAN) 25 MG TABLET    Take 1 tablet (25 mg total) by mouth every 6 (six) hours as needed for nausea or vomiting.   TAMSULOSIN (FLOMAX) 0.4 MG CAPS CAPSULE    Take 0.4 mg by mouth daily.  Modified Medications   No medications on file  Discontinued Medications   No medications on file    Physical Exam: Filed Vitals:   12/21/13 1036  BP: 136/82  Pulse: 68  Temp: 97.2 F (36.2 C)  Resp: 18     Labs reviewed: Basic Metabolic Panel:  Recent Labs  11/30/13 1658 12/01/13 0559  12/18/13 0005 12/19/13 0408 12/20/13 0410  NA 137 138  < > 135* 135* 138  K 4.8 4.9  < > 4.1 3.8 3.4*  CL 97 100  < > 96 96 95*  CO2 27 28  < > 25 27 30   GLUCOSE 139* 162*  < > 297* 285* 205*  BUN 23 23  < > 22 31* 30*  CREATININE 1.27 1.17  < > 1.34 1.47* 1.18  CALCIUM 8.1* 8.4  < > 8.4 8.4 8.5  MG 2.1  2.4  --   --   --   --   PHOS 5.4* 4.9*  --   --   --   --   < > = values in this interval not displayed.  Liver Function Tests:  Recent Labs  11/10/13 1039 11/25/13 1603 12/16/13 2014  AST 13 14 12   ALT 23 28 29   ALKPHOS 93 82 113  BILITOT 0.46 0.4 0.5  PROT 6.8 6.8 6.5  ALBUMIN 3.0* 3.1* 2.8*    CBC:  Recent Labs  10/22/13 0734 11/10/13 1039  12/18/13 0005 12/19/13 0408 12/20/13 0410  WBC 6.4 6.3  < > 5.8 7.7 6.7  NEUTROABS  --  4.4  --   --   --   --   HGB 12.9* 12.3*  < > 10.8* 11.4* 11.6*  HCT 39.3 37.9*  < > 32.7* 34.1* 34.3*  MCV 80.2 79.6  < > 77.7* 76.6* 76.2*  PLT 235 248  < > 177 181 162  < > = values in this interval not displayed.  Significant Diagnostic Results:   Assessment/Plan No problem-specific assessment & plan notes found for this encounter.   Family/ staff Communication:   Goals of care:   Labs/tests ordered:    This encounter was created in error - please disregard. This encounter was created in error - please disregard.

## 2013-12-22 ENCOUNTER — Ambulatory Visit
Admission: RE | Admit: 2013-12-22 | Discharge: 2013-12-22 | Disposition: A | Discharge status: Home / Self Care | Payer: Medicare Other | Source: Ambulatory Visit | Attending: Radiation Oncology | Admitting: Radiation Oncology

## 2013-12-22 ENCOUNTER — Ambulatory Visit: Payer: Medicare Other

## 2013-12-22 DIAGNOSIS — Z510 Encounter for antineoplastic radiation therapy: Secondary | ICD-10-CM | POA: Diagnosis not present

## 2013-12-23 ENCOUNTER — Other Ambulatory Visit: Payer: Medicare Other

## 2013-12-23 ENCOUNTER — Ambulatory Visit: Payer: Medicare Other

## 2013-12-23 ENCOUNTER — Ambulatory Visit: Payer: Medicare Other | Admitting: Radiation Oncology

## 2013-12-23 ENCOUNTER — Ambulatory Visit
Admission: RE | Admit: 2013-12-23 | Discharge: 2013-12-23 | Disposition: A | Discharge status: Home / Self Care | Payer: Medicare Other | Source: Ambulatory Visit | Attending: Radiation Oncology | Admitting: Radiation Oncology

## 2013-12-23 DIAGNOSIS — Z510 Encounter for antineoplastic radiation therapy: Secondary | ICD-10-CM | POA: Diagnosis not present

## 2013-12-24 ENCOUNTER — Non-Acute Institutional Stay (SKILLED_NURSING_FACILITY): Payer: Medicare Other | Admitting: Internal Medicine

## 2013-12-24 ENCOUNTER — Ambulatory Visit: Payer: Medicare Other

## 2013-12-24 ENCOUNTER — Ambulatory Visit
Admission: RE | Admit: 2013-12-24 | Discharge: 2013-12-24 | Disposition: A | Discharge status: Home / Self Care | Payer: Medicare Other | Source: Ambulatory Visit | Attending: Radiation Oncology | Admitting: Radiation Oncology

## 2013-12-24 ENCOUNTER — Ambulatory Visit: Payer: Medicare Other | Admitting: Radiation Oncology

## 2013-12-24 DIAGNOSIS — M546 Pain in thoracic spine: Secondary | ICD-10-CM

## 2013-12-24 DIAGNOSIS — Z510 Encounter for antineoplastic radiation therapy: Secondary | ICD-10-CM | POA: Diagnosis not present

## 2013-12-24 NOTE — Progress Notes (Signed)
Patient ID: PATE AYLWARD, male   DOB: [birth year], 73 y.o.   MRN: [id number]    Facility: [facility]  Chief Complaint  Patient presents with  . Acute Visit    pain not adequately controlled   Allergies  Allergen Reactions  . Other Other (See Comments)    Anesthesia (gas) medication, Reaction: went into coma in the 1960's   HPI 73 y/o male patient is here for STR. He has history of RCC with metastases to the spine and is on radiation treatment, supportive treatment with analgesia and decadron. He mentions being on methadone 10 mg bid at home and this helping better with his pain. Pain currently not under control in his back going to his legs at times.   ROS No dyspnea or chest pain Pain not under control mainly at night time and early morning thus interrupting with his sleep Denies muscle cramps Nerves calm with current medication  Medication reviewed. See [facility]  Physical exam BP 127/68  Pulse 71  Temp(Src) 97.6 F (36.4 C)  Resp 16  Wt 208 lb (94.348 kg)  SpO2 96%  General- elderly male in no acute distress Head- atraumatic, normocephalic Cardiovascular- normal s1,s2 Respiratory- bilateral clear to auscultation Musculoskeletal- able to move all 4 extremities, weakness present in legs more than arms, no leg edema Neurological- no focal deficit Psychiatry- alert and oriented  Assessment/plan  Back pain Not under control with current pain regimen. Has history of RCC with metastases to the spine. Will change methadone to 10 mg bid prn for pain and continue hydrocodone-apap 10-325 q6h prn pain. Continue decadron and radiation therapy

## 2013-12-27 ENCOUNTER — Encounter: Payer: Self-pay | Admitting: Radiation Oncology

## 2013-12-27 ENCOUNTER — Ambulatory Visit: Payer: Medicare Other

## 2013-12-27 ENCOUNTER — Ambulatory Visit
Admission: RE | Admit: 2013-12-27 | Discharge: 2013-12-27 | Disposition: A | Discharge status: Home / Self Care | Payer: Medicare Other | Source: Ambulatory Visit | Attending: Radiation Oncology | Admitting: Radiation Oncology

## 2013-12-27 ENCOUNTER — Ambulatory Visit: Payer: Medicare Other | Admitting: Radiation Oncology

## 2013-12-27 VITALS — BP 126/60 | HR 61 | Temp 98.0°F | Ht 67.0 in | Wt 190.9 lb

## 2013-12-27 DIAGNOSIS — C7952 Secondary malignant neoplasm of bone marrow: Principal | ICD-10-CM

## 2013-12-27 DIAGNOSIS — Z510 Encounter for antineoplastic radiation therapy: Secondary | ICD-10-CM | POA: Diagnosis not present

## 2013-12-27 DIAGNOSIS — C7951 Secondary malignant neoplasm of bone: Secondary | ICD-10-CM

## 2013-12-27 NOTE — Progress Notes (Signed)
   Weekly Management Note:  outpatient    ICD-9-CM  1. 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  CBCT/MVCT images/Port film x-rays were reviewed.  The chart was checked. Reports he is ambulating with walker and PT.  Numbness in legs better. He reports Pain comes and goes - well controlled over all. Still in nursing home. Limited historian, without family or friends today.  Physical Findings:  height is 5\' 7"  (1.702 m) and weight is 190 lb 14.4 oz (86.592 kg). His temperature is 98 F (36.7 C). His blood pressure is 126/60 and his pulse is 61.  NAD - no thrush - gait not tested - no skin irritation in RT fields  Impression:  The patient is tolerating radiotherapy.  Plan:  Continue radiotherapy as planned. Nursing home did not appear to increase decadron to 8mg  BID as instructed with verbal order over the phone last week.  But, since he is doing adequately on 4mg  BID will keep him at this dose and plan to taper to 4mg  daily in about 2 weeks. Instructions called in by RN today and written on form to nursing home.  I will see him back in approximately 3-4 weeks. Sees med onc in early August. ________________________________   Eppie Gibson, M.D.

## 2013-12-27 NOTE — Progress Notes (Signed)
[name] has received 8 fractions to his T-spine. He denies any pain at this time since he took Methadone prior to treatment.  He uses Hydrocodone at night.He is traveling by wheelchair and is  able to move his lower extremities up and down.  He ambulates with a walker at home, but does not have a wheelchair at home.

## 2013-12-28 ENCOUNTER — Ambulatory Visit
Admission: RE | Admit: 2013-12-28 | Discharge: 2013-12-28 | Disposition: A | Discharge status: Home / Self Care | Payer: Medicare Other | Source: Ambulatory Visit | Attending: Radiation Oncology | Admitting: Radiation Oncology

## 2013-12-28 ENCOUNTER — Ambulatory Visit: Payer: Medicare Other

## 2013-12-28 DIAGNOSIS — Z510 Encounter for antineoplastic radiation therapy: Secondary | ICD-10-CM | POA: Diagnosis not present

## 2013-12-28 MED ORDER — DEXAMETHASONE 4 MG PO TABS
4.0000 mg | ORAL_TABLET | Freq: Two times a day (BID) | ORAL | Status: DC
Start: 2013-12-28 — End: 2014-01-19

## 2013-12-29 ENCOUNTER — Ambulatory Visit
Admission: RE | Admit: 2013-12-29 | Discharge: 2013-12-29 | Disposition: A | Discharge status: Home / Self Care | Payer: Medicare Other | Source: Ambulatory Visit | Attending: Radiation Oncology | Admitting: Radiation Oncology

## 2013-12-29 ENCOUNTER — Ambulatory Visit: Payer: Medicare Other

## 2013-12-29 ENCOUNTER — Encounter: Payer: Self-pay | Admitting: Radiation Oncology

## 2013-12-29 ENCOUNTER — Ambulatory Visit: Payer: Medicare Other | Admitting: Radiation Oncology

## 2013-12-29 ENCOUNTER — Ambulatory Visit: Admit: 2013-12-29 | Payer: Medicare Other | Admitting: Radiation Oncology

## 2013-12-29 DIAGNOSIS — Z510 Encounter for antineoplastic radiation therapy: Secondary | ICD-10-CM | POA: Diagnosis not present

## 2013-12-31 ENCOUNTER — Ambulatory Visit: Payer: Medicare Other | Admitting: Radiation Oncology

## 2013-12-31 ENCOUNTER — Non-Acute Institutional Stay (SKILLED_NURSING_FACILITY): Payer: Medicare Other | Admitting: Internal Medicine

## 2013-12-31 DIAGNOSIS — K1230 Oral mucositis (ulcerative), unspecified: Secondary | ICD-10-CM

## 2013-12-31 DIAGNOSIS — K121 Other forms of stomatitis: Secondary | ICD-10-CM

## 2013-12-31 NOTE — Progress Notes (Signed)
Patient ID: [name], male   DOB: 10-Sep-1940, 73 y.o.   MRN: [id number]     HPI 73 y/o male patient is here for STR. He has history of RCC with metastases to the spine and is on radiation treatment, supportive treatment with analgesia and decadron. He has been having sores in his mouth and complaints of discomfort. He mentions that previously when he had these, using hydrogen peroxide mouth wash had helped him. He feels that eating dried and hard food makes it worse on these sores. Denies any difficulty swallowing or pain with swallowing. No white coating of tongue or cheeks and no bleeding reported  ROS Pain under control Feels relaxed and sleeping fair compared to last week  Medication reviewed. See MAR  Physical exam Vss, afebrile  General- elderly male in no acute distress Head- atraumatic, normocephalic Mouth- no lesion on angle of the mouth, no thrush noted, has a raised bump on left buccal mucosa and mild erythema on right upper inner lip mucosa, no oropharyngeal erythema, poor dentition Cardiovascular- normal s1,s2 Respiratory- bilateral clear to auscultation Musculoskeletal- able to move all 4 extremities, weakness present in legs more than arms, no leg edema Neurological- no focal deficit Psychiatry- alert and oriented  Assessment/plan  Mucositis Likely from his radiation and poor dental hygiene. Will have him brush his teeth twice daily and have him on hydrogen peroxide mouth wash 1.5% qid for a week. Also to apply oragel qid to the affected area for a week and reassess. No signs of candidal infection, odynophagia or dysphagia at present

## 2014-01-02 NOTE — Progress Notes (Signed)
  Radiation Oncology         [phone] ________________________________  Name: HENRYK URSIN MRN: [id number]  Date: 12/29/2013  DOB: 11-May-1941  End of Treatment Note  Diagnosis:  STAGE IV RENAL CELL CARCINOMA with SPINE METASTASES  Indication for treatment:  palliative       Radiation treatment dates:   12/16/2013-12/29/2013  Site/dose:  1) C2-C4 spine / 30 Gy in 10 fractions  2) T9-T11 spine / 30 Gy in 10 fractions  Beams/energy:   1) 2 fields, 10 MV; 2) wedged pair, 15 MV  Narrative: The patient tolerated radiation treatment relatively well.   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 Additionally, movement during MRI imaging was felt to compromise ability to plan SRS safely.  Plan: The patient has completed radiation treatment.Nursing home did not appear to increase decadron to 8mg  BID as instructed with verbal order over the phone last week. But, since he is doing adequately on 4mg  BID will keep him at this dose and plan to taper to 4mg  daily in about 2 weeks. Instructions called in by RN today and written on form to nursing home. I will see him back in approximately 3-4 weeks.  Sees med onc in early August. I advised them to call or return sooner if they have any questions or concerns related to their recovery or treatment.  -----------------------------------  Eppie Gibson, MD

## 2014-01-03 ENCOUNTER — Ambulatory Visit: Payer: Medicare Other | Admitting: Radiation Oncology

## 2014-01-03 ENCOUNTER — Ambulatory Visit: Admit: 2014-01-03 | Payer: Medicare Other | Admitting: Radiation Oncology

## 2014-01-04 ENCOUNTER — Non-Acute Institutional Stay (SKILLED_NURSING_FACILITY): Payer: Medicare Other | Admitting: Internal Medicine

## 2014-01-04 ENCOUNTER — Encounter: Payer: Self-pay | Admitting: Internal Medicine

## 2014-01-04 DIAGNOSIS — Z794 Long term (current) use of insulin: Secondary | ICD-10-CM

## 2014-01-04 DIAGNOSIS — C649 Malignant neoplasm of unspecified kidney, except renal pelvis: Secondary | ICD-10-CM

## 2014-01-04 DIAGNOSIS — R531 Weakness: Secondary | ICD-10-CM

## 2014-01-04 DIAGNOSIS — E119 Type 2 diabetes mellitus without complications: Secondary | ICD-10-CM

## 2014-01-04 DIAGNOSIS — C7951 Secondary malignant neoplasm of bone: Secondary | ICD-10-CM

## 2014-01-04 DIAGNOSIS — IMO0001 Reserved for inherently not codable concepts without codable children: Secondary | ICD-10-CM

## 2014-01-04 DIAGNOSIS — R5381 Other malaise: Secondary | ICD-10-CM

## 2014-01-04 DIAGNOSIS — R5383 Other fatigue: Secondary | ICD-10-CM

## 2014-01-04 DIAGNOSIS — C7952 Secondary malignant neoplasm of bone marrow: Secondary | ICD-10-CM

## 2014-01-04 NOTE — Progress Notes (Signed)
Patient ID: [name], male   DOB: 08-04-1940, 73 y.o.   MRN: [id number]  Location:  [facility] Provider:  Jonelle Sidle L. Mariea Clonts, D.O., C.M.D.  Code Status:  Full code  Chief Complaint  Patient presents with  . Acute Visit    uncontrolled glucose    HPI:  73 yo white male here for short term rehab.  He has renal cell ca and is receiving treatments for this.  He is on dexamethasone.  I was asked to see him due to his CBGs being elevated--he also is planning to go home soon.  He admits that a friend of his brought him some sugar-coated candies that are definitely not part of his diabetic diet.    Review of Systems:  Review of Systems  Constitutional: Negative for fever.  Respiratory: Negative for shortness of breath.   Cardiovascular: Negative for chest pain.  Gastrointestinal: Negative for abdominal pain.  Genitourinary: Negative for dysuria.  Musculoskeletal: Positive for back pain. Negative for falls.  Neurological: Negative for dizziness and headaches.  Psychiatric/Behavioral: Negative for memory loss.    Medications: Patient's Medications  New Prescriptions   No medications on file  Previous Medications   AMIODARONE (PACERONE) 200 MG TABLET    Take 200 mg by mouth daily. For a-fib   B-D ULTRAFINE III SHORT PEN 31G X 8 MM MISC       COMBIVENT RESPIMAT 20-100 MCG/ACT AERS RESPIMAT    Inhale 1 puff into the lungs every 6 (six) hours as needed for shortness of breath.    DEXAMETHASONE (DECADRON) 4 MG TABLET    Take 1 tablet (4 mg total) by mouth 2 (two) times daily. Taper to 4mg  daily in the first week of August. Check glucose as this medication can elevate it.   DILTIAZEM (TIAZAC) 360 MG 24 HR CAPSULE    Take 360 mg by mouth daily.   DOCUSATE SODIUM 100 MG CAPS    Take 100 mg by mouth 2 (two) times daily.   FUROSEMIDE (LASIX) 40 MG TABLET    Take 40 mg by mouth daily.   HYDROCODONE-ACETAMINOPHEN (NORCO) 10-325 MG PER TABLET    Take 1 tablet by mouth every 6  (six) hours as needed (pain).   INSULIN GLARGINE (LANTUS) 100 UNIT/ML SOLOSTAR PEN    Inject 40 Units into the skin at bedtime.   IPRATROPIUM-ALBUTEROL (DUONEB) 0.5-2.5 (3) MG/3ML SOLN    Take 3 mLs by nebulization every 4 (four) hours as needed.   METHADONE (DOLOPHINE) 10 MG TABLET    Take 1 tablet (10 mg total) by mouth at bedtime as needed for moderate pain.   METOPROLOL (LOPRESSOR) 50 MG TABLET    Take 50 mg by mouth 2 (two) times daily.    NITROGLYCERIN (NITRODUR - DOSED IN MG/24 HR) 0.2 MG/HR PATCH    Place 0.2 mg onto the skin daily.   NITROGLYCERIN (NITROSTAT) 0.4 MG SL TABLET    Place 1 tablet (0.4 mg total) under the tongue every 5 (five) minutes as needed. Chest pain   PANTOPRAZOLE (PROTONIX) 40 MG TABLET    Take 40 mg by mouth daily.   PROMETHAZINE (PHENERGAN) 25 MG TABLET    Take 1 tablet (25 mg total) by mouth every 6 (six) hours as needed for nausea or vomiting.   TAMSULOSIN (FLOMAX) 0.4 MG CAPS CAPSULE    Take 0.4 mg by mouth daily.  Modified Medications   No medications on file  Discontinued Medications   No medications on file  Physical Exam: Filed Vitals:   01/04/14 1708  BP: 111/37  Pulse: 65  Temp: 97.9 F (36.6 C)  Resp: 20  Height: 5\' 7"  (1.702 m)  Weight: 209 lb (94.802 kg)  Physical Exam  Constitutional: No distress.  HENT:  Head: Normocephalic and atraumatic.  Cardiovascular: Normal heart sounds.   Pulmonary/Chest: Effort normal and breath sounds normal.  Musculoskeletal:  Walking with walker with therapy  Skin:  Multiple ecchymoses  Psychiatric: He has a normal mood and affect.   Labs reviewed: Basic Metabolic Panel:  Recent Labs  11/30/13 1658 12/01/13 0559  12/18/13 0005 12/19/13 0408 12/20/13 0410  NA 137 138  < > 135* 135* 138  K 4.8 4.9  < > 4.1 3.8 3.4*  CL 97 100  < > 96 96 95*  CO2 27 28  < > 25 27 30   GLUCOSE 139* 162*  < > 297* 285* 205*  BUN 23 23  < > 22 31* 30*  CREATININE 1.27 1.17  < > 1.34 1.47* 1.18  CALCIUM 8.1* 8.4   < > 8.4 8.4 8.5  MG 2.1 2.4  --   --   --   --   PHOS 5.4* 4.9*  --   --   --   --   < > = values in this interval not displayed.  Liver Function Tests:  Recent Labs  11/10/13 1039 11/25/13 1603 12/16/13 2014  AST 13 14 12   ALT 23 28 29   ALKPHOS 93 82 113  BILITOT 0.46 0.4 0.5  PROT 6.8 6.8 6.5  ALBUMIN 3.0* 3.1* 2.8*    CBC:  Recent Labs  10/22/13 0734 11/10/13 1039  12/18/13 0005 12/19/13 0408 12/20/13 0410  WBC 6.4 6.3  < > 5.8 7.7 6.7  NEUTROABS  --  4.4  --   --   --   --   HGB 12.9* 12.3*  < > 10.8* 11.4* 11.6*  HCT 39.3 37.9*  < > 32.7* 34.1* 34.3*  MCV 80.2 79.6  < > 77.7* 76.6* 76.2*  PLT 235 248  < > 177 181 162  < > = values in this interval not displayed.   Assessment/Plan 1. Cancer of kidney, unspecified laterality -keep f/u appts upcoming with Dr. Alen Blew and Dr. Isidore Moos -cont taper of dexamethasone as planned  2. Weakness -did very well today with his therapy--walked the most he has since being here -will continue home health therapy as planned  3. Secondary malignant neoplasm of bone and bone marrow -with associated pain of thoracic spine -cont current pain regimen and dexamethasone  4. IDDM (insulin dependent diabetes mellitus) -currently uncontrolled with cbgs ranging 156-520 -due to his illness, dexamethasone and dietary nonadherence -he says he already gave away the candy that was running his sugar up -will increase lantus to 50 units and cont novolog 5 units before meals with 5 additional if cbg over 150  Family/ staff Communication: seen with unit supervisor  Goals of care: paperwork has already been completed for discharge home   Patient is being discharged with home health services:  PT, OT  Patient is being discharged with the following durable medical equipment:  Standard wheelchair with removable nonelevating leg rests, removable desk arms 18x16 (209 lbs, 75 in), has difficulty walking and muscle weakness that impairs his  ability to perform adls and his walker does not resolve that.  He can safely self-propel the w/c in the home.  He also requires his home CPAP (already has) to be continued.  He needs his lancets, test strips, pen needles and glucometer.    Patient has been advised to f/u with their PCP in 1-2 weeks to bring them up to date on their rehab stay.  They were provided with a 30 day supply of scripts for prescription medications and refills must be obtained from their PCP.    Keep appts upcoming also with  Med onc Dr. Alen Blew 8/4 (3pm labs, 3:30pm visit) Rad onc Dr. Isidore Moos 8/14 4pm Rad onc Dr. Isidore Moos 8/26 1140am

## 2014-01-11 ENCOUNTER — Emergency Department (HOSPITAL_COMMUNITY): Payer: Medicare Other

## 2014-01-11 ENCOUNTER — Encounter (HOSPITAL_COMMUNITY): Payer: Self-pay | Admitting: Emergency Medicine

## 2014-01-11 ENCOUNTER — Ambulatory Visit: Payer: Medicare Other | Admitting: Oncology

## 2014-01-11 ENCOUNTER — Telehealth: Payer: Self-pay | Admitting: *Deleted

## 2014-01-11 ENCOUNTER — Other Ambulatory Visit: Payer: Medicare Other

## 2014-01-11 ENCOUNTER — Inpatient Hospital Stay (HOSPITAL_COMMUNITY)
Admission: EM | Admit: 2014-01-11 | Discharge: 2014-01-13 | DRG: 543 | Disposition: A | Discharge status: Home / Self Care | Payer: Medicare Other | Attending: Family Medicine | Admitting: Family Medicine

## 2014-01-11 DIAGNOSIS — Z833 Family history of diabetes mellitus: Secondary | ICD-10-CM | POA: Diagnosis not present

## 2014-01-11 DIAGNOSIS — I129 Hypertensive chronic kidney disease with stage 1 through stage 4 chronic kidney disease, or unspecified chronic kidney disease: Secondary | ICD-10-CM | POA: Diagnosis present

## 2014-01-11 DIAGNOSIS — R82998 Other abnormal findings in urine: Secondary | ICD-10-CM | POA: Diagnosis present

## 2014-01-11 DIAGNOSIS — I4891 Unspecified atrial fibrillation: Secondary | ICD-10-CM | POA: Diagnosis present

## 2014-01-11 DIAGNOSIS — Z6832 Body mass index (BMI) 32.0-32.9, adult: Secondary | ICD-10-CM | POA: Diagnosis not present

## 2014-01-11 DIAGNOSIS — N183 Chronic kidney disease, stage 3 unspecified: Secondary | ICD-10-CM | POA: Diagnosis present

## 2014-01-11 DIAGNOSIS — C7951 Secondary malignant neoplasm of bone: Principal | ICD-10-CM | POA: Diagnosis present

## 2014-01-11 DIAGNOSIS — Z7401 Bed confinement status: Secondary | ICD-10-CM

## 2014-01-11 DIAGNOSIS — R5381 Other malaise: Secondary | ICD-10-CM | POA: Diagnosis present

## 2014-01-11 DIAGNOSIS — G4733 Obstructive sleep apnea (adult) (pediatric): Secondary | ICD-10-CM | POA: Diagnosis present

## 2014-01-11 DIAGNOSIS — E7800 Pure hypercholesterolemia, unspecified: Secondary | ICD-10-CM | POA: Diagnosis present

## 2014-01-11 DIAGNOSIS — R740 Nonspecific elevation of levels of transaminase and lactic acid dehydrogenase [LDH]: Secondary | ICD-10-CM

## 2014-01-11 DIAGNOSIS — M5030 Other cervical disc degeneration, unspecified cervical region: Secondary | ICD-10-CM | POA: Diagnosis present

## 2014-01-11 DIAGNOSIS — D6182 Myelophthisis: Secondary | ICD-10-CM | POA: Diagnosis present

## 2014-01-11 DIAGNOSIS — E785 Hyperlipidemia, unspecified: Secondary | ICD-10-CM | POA: Diagnosis present

## 2014-01-11 DIAGNOSIS — E1169 Type 2 diabetes mellitus with other specified complication: Secondary | ICD-10-CM

## 2014-01-11 DIAGNOSIS — C649 Malignant neoplasm of unspecified kidney, except renal pelvis: Secondary | ICD-10-CM | POA: Diagnosis present

## 2014-01-11 DIAGNOSIS — S68118A Complete traumatic metacarpophalangeal amputation of other finger, initial encounter: Secondary | ICD-10-CM | POA: Diagnosis not present

## 2014-01-11 DIAGNOSIS — Z550 Illiteracy and low-level literacy: Secondary | ICD-10-CM

## 2014-01-11 DIAGNOSIS — I252 Old myocardial infarction: Secondary | ICD-10-CM

## 2014-01-11 DIAGNOSIS — Z91199 Patient's noncompliance with other medical treatment and regimen due to unspecified reason: Secondary | ICD-10-CM

## 2014-01-11 DIAGNOSIS — I509 Heart failure, unspecified: Secondary | ICD-10-CM | POA: Diagnosis present

## 2014-01-11 DIAGNOSIS — I10 Essential (primary) hypertension: Secondary | ICD-10-CM

## 2014-01-11 DIAGNOSIS — Z794 Long term (current) use of insulin: Secondary | ICD-10-CM | POA: Diagnosis not present

## 2014-01-11 DIAGNOSIS — E1159 Type 2 diabetes mellitus with other circulatory complications: Secondary | ICD-10-CM

## 2014-01-11 DIAGNOSIS — Z905 Acquired absence of kidney: Secondary | ICD-10-CM

## 2014-01-11 DIAGNOSIS — R7402 Elevation of levels of lactic acid dehydrogenase (LDH): Secondary | ICD-10-CM | POA: Diagnosis present

## 2014-01-11 DIAGNOSIS — I2510 Atherosclerotic heart disease of native coronary artery without angina pectoris: Secondary | ICD-10-CM | POA: Diagnosis present

## 2014-01-11 DIAGNOSIS — Z8249 Family history of ischemic heart disease and other diseases of the circulatory system: Secondary | ICD-10-CM

## 2014-01-11 DIAGNOSIS — IMO0001 Reserved for inherently not codable concepts without codable children: Secondary | ICD-10-CM

## 2014-01-11 DIAGNOSIS — N179 Acute kidney failure, unspecified: Secondary | ICD-10-CM | POA: Diagnosis present

## 2014-01-11 DIAGNOSIS — D61818 Other pancytopenia: Secondary | ICD-10-CM | POA: Diagnosis present

## 2014-01-11 DIAGNOSIS — Z9861 Coronary angioplasty status: Secondary | ICD-10-CM

## 2014-01-11 DIAGNOSIS — R5383 Other fatigue: Secondary | ICD-10-CM

## 2014-01-11 DIAGNOSIS — E119 Type 2 diabetes mellitus without complications: Secondary | ICD-10-CM | POA: Diagnosis present

## 2014-01-11 DIAGNOSIS — R531 Weakness: Secondary | ICD-10-CM | POA: Diagnosis present

## 2014-01-11 DIAGNOSIS — I482 Chronic atrial fibrillation, unspecified: Secondary | ICD-10-CM

## 2014-01-11 DIAGNOSIS — R7401 Elevation of levels of liver transaminase levels: Secondary | ICD-10-CM | POA: Diagnosis present

## 2014-01-11 DIAGNOSIS — C7952 Secondary malignant neoplasm of bone marrow: Secondary | ICD-10-CM | POA: Diagnosis present

## 2014-01-11 DIAGNOSIS — Z951 Presence of aortocoronary bypass graft: Secondary | ICD-10-CM | POA: Diagnosis not present

## 2014-01-11 DIAGNOSIS — C641 Malignant neoplasm of right kidney, except renal pelvis: Secondary | ICD-10-CM

## 2014-01-11 DIAGNOSIS — M5136 Other intervertebral disc degeneration, lumbar region: Secondary | ICD-10-CM

## 2014-01-11 DIAGNOSIS — J441 Chronic obstructive pulmonary disease with (acute) exacerbation: Secondary | ICD-10-CM

## 2014-01-11 DIAGNOSIS — J449 Chronic obstructive pulmonary disease, unspecified: Secondary | ICD-10-CM

## 2014-01-11 DIAGNOSIS — E669 Obesity, unspecified: Secondary | ICD-10-CM | POA: Diagnosis present

## 2014-01-11 DIAGNOSIS — Z9119 Patient's noncompliance with other medical treatment and regimen: Secondary | ICD-10-CM

## 2014-01-11 HISTORY — DX: Chronic obstructive pulmonary disease, unspecified: J44.9

## 2014-01-11 HISTORY — DX: Unspecified atrial fibrillation: I48.91

## 2014-01-11 LAB — COMPREHENSIVE METABOLIC PANEL
ALK PHOS: 72 U/L (ref 39–117)
ALT: 78 U/L — ABNORMAL HIGH (ref 0–53)
ANION GAP: 11 (ref 5–15)
AST: 67 U/L — ABNORMAL HIGH (ref 0–37)
Albumin: 2.2 g/dL — ABNORMAL LOW (ref 3.5–5.2)
BUN: 25 mg/dL — AB (ref 6–23)
CO2: 29 mEq/L (ref 19–32)
CREATININE: 1.39 mg/dL — AB (ref 0.50–1.35)
Calcium: 8.1 mg/dL — ABNORMAL LOW (ref 8.4–10.5)
Chloride: 90 mEq/L — ABNORMAL LOW (ref 96–112)
GFR calc non Af Amer: 50 mL/min — ABNORMAL LOW (ref >90)
GFR, EST AFRICAN AMERICAN: 58 mL/min — AB (ref >90)
GLUCOSE: 315 mg/dL — AB (ref 70–99)
POTASSIUM: 4.5 meq/L (ref 3.7–5.3)
Sodium: 130 mEq/L — ABNORMAL LOW (ref 137–147)
TOTAL PROTEIN: 5.5 g/dL — AB (ref 6.0–8.3)
Total Bilirubin: 0.9 mg/dL (ref 0.3–1.2)

## 2014-01-11 LAB — PRO B NATRIURETIC PEPTIDE: Pro B Natriuretic peptide (BNP): 1255 pg/mL — ABNORMAL HIGH (ref 0–125)

## 2014-01-11 LAB — GLUCOSE, CAPILLARY: GLUCOSE-CAPILLARY: 319 mg/dL — AB (ref 70–99)

## 2014-01-11 LAB — CBC WITH DIFFERENTIAL/PLATELET
BASOS ABS: 0 10*3/uL (ref 0.0–0.1)
Basophils Absolute: 0 10*3/uL (ref 0.0–0.1)
Basophils Relative: 0 % (ref 0–1)
Basophils Relative: 0 % (ref 0–1)
EOS ABS: 0 10*3/uL (ref 0.0–0.7)
EOS PCT: 0 % (ref 0–5)
EOS PCT: 1 % (ref 0–5)
Eosinophils Absolute: 0 10*3/uL (ref 0.0–0.7)
HCT: 37.6 % — ABNORMAL LOW (ref 39.0–52.0)
HCT: 39.3 % (ref 39.0–52.0)
Hemoglobin: 12.7 g/dL — ABNORMAL LOW (ref 13.0–17.0)
Hemoglobin: 13.3 g/dL (ref 13.0–17.0)
LYMPHS ABS: 0.2 10*3/uL — AB (ref 0.7–4.0)
LYMPHS PCT: 10 % — AB (ref 12–46)
Lymphocytes Relative: 8 % — ABNORMAL LOW (ref 12–46)
Lymphs Abs: 0.3 10*3/uL — ABNORMAL LOW (ref 0.7–4.0)
MCH: 26.8 pg (ref 26.0–34.0)
MCH: 26.7 pg (ref 26.0–34.0)
MCHC: 33.8 g/dL (ref 30.0–36.0)
MCHC: 33.8 g/dL (ref 30.0–36.0)
MCV: 79.3 fL (ref 78.0–100.0)
MCV: 78.9 fL (ref 78.0–100.0)
MONO ABS: 0.2 10*3/uL (ref 0.1–1.0)
Monocytes Absolute: 0.2 10*3/uL (ref 0.1–1.0)
Monocytes Relative: 7 % (ref 3–12)
Monocytes Relative: 7 % (ref 3–12)
NEUTROS PCT: 85 % — AB (ref 43–77)
NEUTROS PCT: 82 % — AB (ref 43–77)
Neutro Abs: 2.7 10*3/uL (ref 1.7–7.7)
Neutro Abs: 2.6 10*3/uL (ref 1.7–7.7)
PLATELETS: 39 10*3/uL — AB (ref 150–400)
PLATELETS: 44 10*3/uL — AB (ref 150–400)
RBC: 4.74 MIL/uL (ref 4.22–5.81)
RBC: 4.98 MIL/uL (ref 4.22–5.81)
RDW: 17.4 % — ABNORMAL HIGH (ref 11.5–15.5)
RDW: 17.5 % — AB (ref 11.5–15.5)
SMEAR REVIEW: DECREASED
WBC MORPHOLOGY: INCREASED
WBC: 3.1 10*3/uL — ABNORMAL LOW (ref 4.0–10.5)
WBC: 3.1 10*3/uL — AB (ref 4.0–10.5)

## 2014-01-11 LAB — URINALYSIS, ROUTINE W REFLEX MICROSCOPIC
Glucose, UA: 500 mg/dL — AB
KETONES UR: NEGATIVE mg/dL
LEUKOCYTES UA: NEGATIVE
NITRITE: NEGATIVE
Protein, ur: 100 mg/dL — AB
Specific Gravity, Urine: 1.025 (ref 1.005–1.030)
UROBILINOGEN UA: 2 mg/dL — AB (ref 0.0–1.0)
pH: 5.5 (ref 5.0–8.0)

## 2014-01-11 LAB — URINE MICROSCOPIC-ADD ON

## 2014-01-11 LAB — TROPONIN I: Troponin I: <0.3 ng/mL (ref <0.30)

## 2014-01-11 MED ORDER — IPRATROPIUM-ALBUTEROL 0.5-2.5 (3) MG/3ML IN SOLN: 3.0000 mL | Freq: Four times a day (QID) | RESPIRATORY_TRACT | Status: DC | PRN

## 2014-01-11 MED ORDER — DEXAMETHASONE SODIUM PHOSPHATE 4 MG/ML IJ SOLN
8.0000 mg | INTRAMUSCULAR | Status: DC
  Administered 2014-01-11 – 2014-01-12 (×2): 8 mg via INTRAVENOUS
  Filled 2014-01-11 (×2): qty 2

## 2014-01-11 MED ORDER — DILTIAZEM HCL ER BEADS 240 MG PO CP24
360.0000 mg | ORAL_CAPSULE | Freq: Every day | ORAL | Status: DC
  Filled 2014-01-11 (×2): qty 1

## 2014-01-11 MED ORDER — METOPROLOL TARTRATE 50 MG PO TABS
50.0000 mg | ORAL_TABLET | Freq: Two times a day (BID) | ORAL | Status: DC
  Administered 2014-01-11 – 2014-01-13 (×4): 50 mg via ORAL
  Filled 2014-01-11 (×5): qty 1

## 2014-01-11 MED ORDER — INSULIN ASPART 100 UNIT/ML ~~LOC~~ SOLN
0.0000 [IU] | Freq: Every day | SUBCUTANEOUS | Status: DC
  Administered 2014-01-11: 4 [IU] via SUBCUTANEOUS
  Administered 2014-01-12: 2 [IU] via SUBCUTANEOUS

## 2014-01-11 MED ORDER — FUROSEMIDE 10 MG/ML IJ SOLN
40.0000 mg | Freq: Once | INTRAMUSCULAR | Status: AC
  Administered 2014-01-11: 40 mg via INTRAVENOUS
  Filled 2014-01-11: qty 4

## 2014-01-11 MED ORDER — SODIUM CHLORIDE 0.9 % IV SOLN
INTRAVENOUS | Status: DC
Start: 2014-01-11 — End: 2014-01-13
  Administered 2014-01-11 – 2014-01-13 (×3): via INTRAVENOUS

## 2014-01-11 MED ORDER — SODIUM CHLORIDE 0.9 % IJ SOLN
3.0000 mL | Freq: Two times a day (BID) | INTRAMUSCULAR | Status: DC
  Administered 2014-01-11 – 2014-01-13 (×3): 3 mL via INTRAVENOUS

## 2014-01-11 MED ORDER — AMIODARONE HCL 200 MG PO TABS
200.0000 mg | ORAL_TABLET | Freq: Every day | ORAL | Status: DC
  Administered 2014-01-12 – 2014-01-13 (×2): 200 mg via ORAL
  Filled 2014-01-11 (×4): qty 1

## 2014-01-11 MED ORDER — FUROSEMIDE 40 MG PO TABS
40.0000 mg | ORAL_TABLET | Freq: Every day | ORAL | Status: DC
  Administered 2014-01-12 – 2014-01-13 (×2): 40 mg via ORAL
  Filled 2014-01-11 (×2): qty 1

## 2014-01-11 MED ORDER — HYDROCODONE-ACETAMINOPHEN 5-325 MG PO TABS
1.0000 | ORAL_TABLET | ORAL | Status: DC | PRN
  Administered 2014-01-12: 1 via ORAL
  Administered 2014-01-13: 2 via ORAL
  Filled 2014-01-11: qty 2
  Filled 2014-01-11: qty 1

## 2014-01-11 MED ORDER — INSULIN ASPART 100 UNIT/ML ~~LOC~~ SOLN
0.0000 [IU] | Freq: Three times a day (TID) | SUBCUTANEOUS | Status: DC
  Administered 2014-01-12: 3 [IU] via SUBCUTANEOUS
  Administered 2014-01-12: 8 [IU] via SUBCUTANEOUS
  Administered 2014-01-12: 11 [IU] via SUBCUTANEOUS
  Administered 2014-01-13: 15 [IU] via SUBCUTANEOUS
  Administered 2014-01-13: 8 [IU] via SUBCUTANEOUS

## 2014-01-11 MED ORDER — IPRATROPIUM-ALBUTEROL 20-100 MCG/ACT IN AERS: 1.0000 | INHALATION_SPRAY | Freq: Four times a day (QID) | RESPIRATORY_TRACT | Status: DC | PRN

## 2014-01-11 MED ORDER — PANTOPRAZOLE SODIUM 40 MG PO TBEC
40.0000 mg | DELAYED_RELEASE_TABLET | Freq: Every day | ORAL | Status: DC
  Administered 2014-01-11 – 2014-01-13 (×3): 40 mg via ORAL
  Filled 2014-01-11 (×3): qty 1

## 2014-01-11 MED ORDER — TAMSULOSIN HCL 0.4 MG PO CAPS
0.4000 mg | ORAL_CAPSULE | Freq: Every day | ORAL | Status: DC
  Administered 2014-01-12 – 2014-01-13 (×2): 0.4 mg via ORAL
  Filled 2014-01-11 (×2): qty 1

## 2014-01-11 NOTE — H&P (Signed)
[facility] Admission History and Physical   Patient name: [name] Medical record number: [id number] Date of birth: [birth year] Age: 73 y.o. Gender: male  Primary Care Provider: Maricela Curet, MD Consultants: none Code Status: FUll  Chief Complaint: LE weakness  Assessment and Plan: [name] is a 73 y.o. male presenting with LE weakness. PMH is significant for DM, CAD, Afib, HTN, COPD, renal cell cancer w/ metastasis.   LE weakness: likely multifactorial and secondary to cancer progression vs deconditioning vs cessation of steroids vs radiation. non-focal so stroke less likely. No signs of infectious etiology. Pt w/ recent similar admission last month. Now undergoing radiation for cervical mets from renal cell cancer. Unable to care for self as not longer at a NH. Pt also on steroid taper which may have made symptoms worse. Dopplers neg for DVT. Lasix 40IV given in ED to reduce LE edema - Admit to Tele triad Team 2 - PT/OT - Restart Decadron 8mg  - consider MRI if not improving  ARF: Cr. 1.39 on admission. Baseline 1.2. Pt likely moving towards CKD. Poor PO intake vs cancer progression - NS 150ml/hr - CMET in am.   Elevated Transaminitis: h/o Hep C. AST/ALT 67/78. Previously nml. Metastatic disease vs infectious vs fatty liver. Last CT 01/2013 w/ stable solitary hepatic lesion and fatty liver.  - trend - consider CT abd plv w/ contrast if remain elevated  Renal Cell Ca: Missed appt on day of admission with [facility].  - Call and discuss therapy w/ Oncology if pt admission likely to be prolonged.   DM: stable  - SSI  Bacteruria: Urinalysis w/ many bacteria, Hgb, but no leuk or nitrite. Squams on UA - contamination. Pt asymptomatic. No need to treat at this time.  [name] sent  HTN: normotensive on admission. No sign of Afib. EKG unchanged from previous.  - Continue Diltiazem, amiodarone, metoprolol - continue hoem Lasix.   Thrombocytopenia and  leukopenia: etiology unclear. No sign of infection. Likely due in large part to cancer w/ mets and marrow suppression from steroids. Lab error also possible - CBC w/ Diff  COPD: no exacerbation - O2 PRN and duonebs PRN  FEN/GI: Carb mod diet  Prophylaxis: no DVT prophylaxis due to PLT 39  Disposition: pending improvement. Likely needs to go back to NH for further rehab  History of Present Illness: [name] is a 73 y.o. male presenting with Inabbility to walk. Secondary to diffuse LE weakness. Ongoing for months, and getting worse. Discharged from [facility] last Friday due to insurance. At home during this time. Unable to get up and care for self at home. Recently started radiation therapy but no chemo. Denies fevers, CP, SOB, Lightheadedness, syncope, rash, SZR like activity. Denies taking decadron after leaving NH on 7/28.   Pt poor historian.   Review Of Systems: Per HPI with the following additions: none Otherwise 12 point review of systems was performed and was unremarkable.  Patient Active Problem List   Diagnosis Date Noted  . Secondary malignant neoplasm of bone and bone marrow 12/20/2013  . Weakness 12/16/2013  . OSA (obstructive sleep apnea) 11/30/2013  . Cancer of kidney 11/10/2013  . Dehydration 01/24/2013  . Atrial fibrillation 01/24/2013  . CKD (chronic kidney disease) stage 3, GFR 30-59 ml/min 01/24/2013  . Nausea with vomiting 01/24/2013  . Hyperglycemia 01/24/2013  . Abdominal pain, unspecified site 01/24/2013  . Anemia 10/03/2012  . Acute pulmonary edema 09/29/2012  . Acute respiratory failure secondary to pulmonary  edema and community-acquired pneumonia 08/30/2012  . HCAP (healthcare-associated pneumonia) 08/30/2012  . COPD exacerbation 08/30/2012  . COPD (chronic obstructive pulmonary disease) 08/30/2012  . Atrial fibrillation with rapid ventricular response 08/21/2012  . CAD (coronary artery disease), native coronary artery 08/21/2012  . IDDM (insulin  dependent diabetes mellitus) 08/21/2012  . Hyperlipidemia LDL goal < 70 08/21/2012  . Hypertension associated with diabetes 08/21/2012  . Degenerative disc disease, lumbar 08/21/2012  . Illiterate 08/21/2012   Past Medical History: Past Medical History  Diagnosis Date  . Coronary artery disease   . Hypertension   . High cholesterol   . Diabetes mellitus   . LBBB (left bundle branch block)   . Paroxysmal atrial fibrillation   . Pneumonia 10-2012  . CHF (congestive heart failure)   . Renal disorder     kidney cancer  . Hepatitis many yrs ago    no current liver problems per pt. reportedly Hepatitis C  . Disc     6 discs loswer back, back brace prn  . Anemia   . Cancer 2010    right nephrectomy  . 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 Complication of anesthesia     years ago went into a coma  . Sleep apnea     cpap setting of 4, or uses oxygen 4 liters per [place]   Past Surgical History: Past Surgical History  Procedure Laterality Date  . Nephrectomy Right 2010  . Esophagogastroduodenoscopy Left 10/02/2012    Dr. Paulita Fujita: normal   . Colonoscopy Left 10/03/2012    Dr. Michail Sermon: poorly prepped colon, lipomatous ileocecal valve. Path revealed focal acute inflammation with question of NSAID-induced injury.   . Back surgery  1960's and 1970's    lower x 2  . Left ring finger traumatic amputation      jumped out of truck, finger caught  . Coronary angioplasty with stent placement  yrs ago    x 5 or 6--DES TO LAD AND RAMUS  . Radiology with anesthesia N/A 11/29/2013    Procedure: RADIOLOGY WITH ANESTHESIA;  Surgeon: Consuella Lose, MD;  Location: [place];  Service: Radiology;  Laterality: N/A;  . Posterior lumbar fusion 4 level N/A 11/30/2013    Procedure: THORACIC TEN LAMINECTOMY DECOMPRESSION WITH THORACIC EIGHT-THORACIC TWELVE INSTRUMENTATION;  Surgeon: Consuella Lose, MD;  Location: [place] [facility];  Service: Neurosurgery;  Laterality: N/A;   Social History: History  Substance  Use Topics  . Smoking status: Passive Smoke Exposure - Never Smoker  . Smokeless tobacco: Never Used  . Alcohol Use: No     Comment: quit ETOH 25-30 years ago   Additional social history: none Please also refer to relevant sections of EMR.  Family History: Family History  Problem Relation Age of Onset  . Hyperlipidemia Mother   . Diabetes Mother   . Heart attack Father   . Colon cancer Neg Hx   . Crohn's disease Neg Hx   . Ulcerative colitis Neg Hx    Allergies and Medications: Allergies  Allergen Reactions  . Other Other (See Comments)    Anesthesia (gas) medication, Reaction: went into coma in the 1960's   No current facility-administered medications on file prior to encounter.   Current Outpatient Prescriptions on File Prior to Encounter  Medication Sig Dispense Refill  . amiodarone (PACERONE) 200 MG tablet Take 200 mg by mouth daily. For a-fib      . COMBIVENT RESPIMAT 20-100 MCG/ACT AERS respimat Inhale 1 puff into the lungs every  6 (six) hours as needed for shortness of breath.       . dexamethasone (DECADRON) 4 MG tablet Take 1 tablet (4 mg total) by mouth 2 (two) times daily. Taper to 4mg  daily in the first week of August. Check glucose as this medication can elevate it.      . diltiazem (TIAZAC) 360 MG 24 hr capsule Take 360 mg by mouth daily.      Marland Kitchen docusate sodium 100 MG CAPS Take 100 mg by mouth 2 (two) times daily.  60 capsule  0  . furosemide (LASIX) 40 MG tablet Take 40 mg by mouth daily.      Marland Kitchen HYDROcodone-acetaminophen (NORCO) 10-325 MG per tablet Take 1 tablet by mouth every 6 (six) hours as needed (pain).  65 tablet  0  . Insulin Glargine (LANTUS) 100 UNIT/ML Solostar Pen Inject 40 Units into the skin at bedtime.      Marland Kitchen ipratropium-albuterol (DUONEB) 0.5-2.5 (3) MG/3ML SOLN Take 3 mLs by nebulization every 4 (four) hours as needed.  360 mL  0  . methadone (DOLOPHINE) 10 MG tablet Take 1 tablet (10 mg total) by mouth at bedtime as needed for moderate pain.  30  tablet  0  . metoprolol (LOPRESSOR) 50 MG tablet Take 50 mg by mouth 2 (two) times daily.       . nitroGLYCERIN (NITRODUR - DOSED IN MG/24 HR) 0.2 mg/hr patch Place 0.2 mg onto the skin daily.      . nitroGLYCERIN (NITROSTAT) 0.4 MG SL tablet Place 1 tablet (0.4 mg total) under the tongue every 5 (five) minutes as needed. Chest pain  25 tablet  5  . pantoprazole (PROTONIX) 40 MG tablet Take 40 mg by mouth daily.      . promethazine (PHENERGAN) 25 MG tablet Take 1 tablet (25 mg total) by mouth every 6 (six) hours as needed for nausea or vomiting.  30 tablet  5  . tamsulosin (FLOMAX) 0.4 MG CAPS capsule Take 0.4 mg by mouth daily.        Objective: BP 134/79  Pulse 93  Temp(Src) 98.5 F (36.9 C) (Oral)  Resp 20  SpO2 97% Exam: General: NAD, WNWD HEENT: EOMI, dry mm Cardiovascular: II/VI systolic murmur Respiratory: CTAB, nml effort Abdomen: soft and non-ttp, no masses Extremities: 2+ LE pitting edema Skin: intact, no rash Neuro: CN2-12 grossly intact. UE move symmetrically and w/ 5/5 strength. Sensation in LE intact and symetrical. Hip flexion and extension and plantar flexion and dorsiflexion 4/5 bilat. Pt refuses to attempt to walk due to fear of falling  Labs and Imaging: Results for orders placed during the hospital encounter of 01/11/14 (from the past 24 hour(s))  CBC WITH DIFFERENTIAL     Status: Abnormal   Collection Time    01/11/14  2:47 PM      Result Value Ref Range   WBC 3.1 (*) 4.0 - 10.5 K/uL   RBC 4.74  4.22 - 5.81 MIL/uL   Hemoglobin 12.7 (*) 13.0 - 17.0 g/dL   HCT 37.6 (*) 39.0 - 52.0 %   MCV 79.3  78.0 - 100.0 fL   MCH 26.8  26.0 - 34.0 pg   MCHC 33.8  30.0 - 36.0 g/dL   RDW 17.4 (*) 11.5 - 15.5 %   Platelets 39 (*) 150 - 400 K/uL   Neutrophils Relative % 85 (*) 43 - 77 %   Lymphocytes Relative 8 (*) 12 - 46 %   Monocytes Relative 7  3 -  12 %   Eosinophils Relative 0  0 - 5 %   Basophils Relative 0  0 - 1 %   Neutro Abs 2.7  1.7 - 7.7 K/uL   Lymphs Abs  0.2 (*) 0.7 - 4.0 K/uL   Monocytes Absolute 0.2  0.1 - 1.0 K/uL   Eosinophils Absolute 0.0  0.0 - 0.7 K/uL   Basophils Absolute 0.0  0.0 - 0.1 K/uL   RBC Morphology POLYCHROMASIA PRESENT     WBC Morphology INCREASED BANDS (>20% BANDS)     Smear Review PLATELET COUNT CONFIRMED BY SMEAR    COMPREHENSIVE METABOLIC PANEL     Status: Abnormal   Collection Time    01/11/14  2:47 PM      Result Value Ref Range   Sodium 130 (*) 137 - 147 mEq/L   Potassium 4.5  3.7 - 5.3 mEq/L   Chloride 90 (*) 96 - 112 mEq/L   CO2 29  19 - 32 mEq/L   Glucose, Bld 315 (*) 70 - 99 mg/dL   BUN 25 (*) 6 - 23 mg/dL   Creatinine, Ser 1.39 (*) 0.50 - 1.35 mg/dL   Calcium 8.1 (*) 8.4 - 10.5 mg/dL   Total Protein 5.5 (*) 6.0 - 8.3 g/dL   Albumin 2.2 (*) 3.5 - 5.2 g/dL   AST 67 (*) 0 - 37 U/L   ALT 78 (*) 0 - 53 U/L   Alkaline Phosphatase 72  39 - 117 U/L   Total Bilirubin 0.9  0.3 - 1.2 mg/dL   GFR calc non Af Amer 50 (*) >90 mL/min   GFR calc Af Amer 58 (*) >90 mL/min   Anion gap 11  5 - 15  PRO B NATRIURETIC PEPTIDE     Status: Abnormal   Collection Time    01/11/14  2:47 PM      Result Value Ref Range   Pro B Natriuretic peptide (BNP) 1255.0 (*) 0 - 125 pg/mL  TROPONIN I     Status: None   Collection Time    01/11/14  2:47 PM      Result Value Ref Range   Troponin I <0.30  <0.30 ng/mL  URINALYSIS, ROUTINE W REFLEX MICROSCOPIC     Status: Abnormal   Collection Time    01/11/14  5:25 PM      Result Value Ref Range   Color, Urine AMBER (*) YELLOW   APPearance CLEAR  CLEAR   Specific Gravity, Urine 1.025  1.005 - 1.030   pH 5.5  5.0 - 8.0   Glucose, UA 500 (*) NEGATIVE mg/dL   Hgb urine dipstick LARGE (*) NEGATIVE   Bilirubin Urine SMALL (*) NEGATIVE   Ketones, ur NEGATIVE  NEGATIVE mg/dL   Protein, ur 100 (*) NEGATIVE mg/dL   Urobilinogen, UA 2.0 (*) 0.0 - 1.0 mg/dL   Nitrite NEGATIVE  NEGATIVE   Leukocytes, UA NEGATIVE  NEGATIVE  URINE MICROSCOPIC-ADD ON     Status: Abnormal   Collection Time     01/11/14  5:25 PM      Result Value Ref Range   Squamous Epithelial / LPF FEW (*) RARE   WBC, UA 3-6  <3 WBC/hpf   Bacteria, UA MANY (*) RARE   Casts GRANULAR CAST (*) NEGATIVE    Ct Thoracic Spine W Contrast  12/13/2013   CLINICAL DATA:  Metastatic Disease. Metastatic renal cell carcinoma. Stereotactic radiation.  FLUOROSCOPY TIME:  1 min 51 seconds  PROCEDURE: LUMBAR PUNCTURE FOR THORACIC MYELOGRAM  After thorough discussion of risks and benefits of the procedure including bleeding, infection, injury to nerves, blood vessels, adjacent structures as well as headache and CSF leak, written and oral informed consent was obtained. Consent was obtained by Dr. Dereck Ligas.  Patient was positioned prone on the fluoroscopy table. Local anesthesia was provided with 1% lidocaine without epinephrine after prepped and draped in the usual sterile fashion. Puncture was performed at L3-L4 (failed access at L4-L5) using a 3 1/2 inch 22 gauge pencil point 22 gauge pencil point Whitacre spinal needle spinal needle via RIGHT paramedian approach. Using a single pass through the dura, the needle was placed within the thecal sac, with return of clear CSF. 10 mL of Omnipaque-300 was injected into the thecal sac, with normal opacification of the nerve roots and cauda equina consistent with free flow within the subarachnoid space. The patient was then moved to the trendelenburg position and contrast flowed into the Thoracic spine region.  I personally performed the lumbar puncture and administered the intrathecal contrast. I also personally supervised acquisition of the myelogram images.  TECHNIQUE: Contiguous axial images were obtained through the Thoracic spine after the intrathecal infusion of infusion. Coronal and sagittal reconstructions were obtained of the axial image sets.  FINDINGS: THORACIC MYELOGRAM FINDINGS:  Posterior rod and pedicle screw fixation is present from T8 through T12. Laminectomy has been performed  at T10 along with cement embolization of the vertebral body.  CT THORACIC MYELOGRAM FINDINGS:  There is osteolysis of the T10 vertebra with changes of would appears to be cement embolization extending into the paravertebral veins. There is a small radiolucency it T1 which may represent a hemangioma but is nonspecific. Attention on follow-up is recommended. Metastatic disease is difficult to exclude. Thoracic spine DISH is present. Thoracic degenerative disease is mild. There is a T3-T4 RIGHT paracentral protrusion that produces mild central stenosis, just contacting the ventral cord and indenting. Unless otherwise noted below, central canal and foramina appear adequately patent.  T7-T8 also shows a RIGHT paracentral protrusion that produces mild central stenosis, contacting the ventral cord.  T9-T10 shows circumferential soft tissue attenuation around the thecal sac. There is near complete effacement of the subarachnoid space dorsal to the T10 vertebra. Artifact from embolization material and hardware degrades evaluation at this level. Bone cement is present in the LEFT side of the central canal, likely filling a basivertebral vein. The central stenosis appears severe based on scant visualization of contrast.  T10-T11 disc space also shows severe stenosis with tiny subarachnoid space. The thecal sac remains surrounded which may represent granulation tissue or epidural tumor.  T11-T12: LEFT eccentric disc bulging producing mild central stenosis. Posterior ligamentum flavum redundancy.  L1-L2: Only partially visualized. There appears to be disc bulging producing mild central stenosis.  Paraspinal soft tissues demonstrate prominent extrapleural fat and posterior pulmonary nodules in the superior segment of both lower lobes. The largest nodule in the superior segment of the LEFT lower lobe measures 12 mm (image 55 series 2). RIGHT nephrectomy incidentally noted. Low RIGHT peritracheal lymphadenopathy is present  measuring 26 mm x 32 mm (image 42 series 3). Subcarinal node enlarged, measuring 26 mm x 36 mm.  IMPRESSION: 1. Successful L3-L4 lumbar puncture for thoracic myelogram. 2. Postprocedural/postsurgical changes at T10 around the known metastatic lesion. Uncomplicated posterior rod and pedicle screw fixation extending from T8 through T12. Residual severe spinal stenosis extending from T9 through T11. 3. Mild thoracic spondylosis. 4. Faint lucency in the anterior T1 vertebra is nonspecific. This could  represent hemangioma. Metastatic disease could also produce this appearance. Attention on follow-up recommended. 5. Mediastinal adenopathy/metastatic disease. Multiple pulmonary nodules compatible with hematogenous metastases.   Electronically Signed   By: Dereck Ligas M.D.   On: 12/13/2013 13:31   Mr Cervical Spine W Wo Contrast  12/15/2013   CLINICAL DATA:  Cervical spine metastatic disease. Renal cell carcinoma.  EXAM: MRI CERVICAL SPINE WITHOUT AND WITH CONTRAST  TECHNIQUE: Multiplanar and multiecho pulse sequences of the cervical spine, to include the craniocervical junction and cervicothoracic junction, were obtained according to standard protocol without and with intravenous contrast.  CONTRAST:  35mL MULTIHANCE GADOBENATE DIMEGLUMINE 529 MG/ML IV SOLN  COMPARISON:  MRI of the cervical spine 10/08/2013.  FINDINGS: The lesion within the left CT vertebral body extends into the posterior elements on the left as before. The lesion measures 3.0 x 2.1 x 3.2 cm. Tumor extends into the left C2-3 and C3-4 neural foramina, worse at C3-4.  Asymmetric left-sided facet degenerative changes are again noted. No other focal metastatic lesions are present.  Moderate central canal stenosis is present at C5-6 due to a broad-based disc osteophyte complex. More mild disease is again noted at C6-7.  IMPRESSION: 1. Prominent left-sided metastatic lesion at C3 E within the vertebral body extending into the posterior elements as above.  2. Tumor extends into the left C2-3 and C3-4 neural foramina, worse at C3-4. 3. Moderate spondylosis in the lower cervical spine is stable.   Electronically Signed   By: Lawrence Santiago M.D.   On: 12/15/2013 19:08   Dg Myelogram Thoracic  12/13/2013   CLINICAL DATA:  Metastatic Disease. Metastatic renal cell carcinoma. Stereotactic radiation.  FLUOROSCOPY TIME:  1 min 51 seconds  PROCEDURE: LUMBAR PUNCTURE FOR THORACIC MYELOGRAM  After thorough discussion of risks and benefits of the procedure including bleeding, infection, injury to nerves, blood vessels, adjacent structures as well as headache and CSF leak, written and oral informed consent was obtained. Consent was obtained by Dr. Dereck Ligas.  Patient was positioned prone on the fluoroscopy table. Local anesthesia was provided with 1% lidocaine without epinephrine after prepped and draped in the usual sterile fashion. Puncture was performed at L3-L4 (failed access at L4-L5) using a 3 1/2 inch 22 gauge pencil point 22 gauge pencil point Whitacre spinal needle spinal needle via RIGHT paramedian approach. Using a single pass through the dura, the needle was placed within the thecal sac, with return of clear CSF. 10 mL of Omnipaque-300 was injected into the thecal sac, with normal opacification of the nerve roots and cauda equina consistent with free flow within the subarachnoid space. The patient was then moved to the trendelenburg position and contrast flowed into the Thoracic spine region.  I personally performed the lumbar puncture and administered the intrathecal contrast. I also personally supervised acquisition of the myelogram images.  TECHNIQUE: Contiguous axial images were obtained through the Thoracic spine after the intrathecal infusion of infusion. Coronal and sagittal reconstructions were obtained of the axial image sets.  FINDINGS: THORACIC MYELOGRAM FINDINGS:  Posterior rod and pedicle screw fixation is present from T8 through T12. Laminectomy  has been performed at T10 along with cement embolization of the vertebral body.  CT THORACIC MYELOGRAM FINDINGS:  There is osteolysis of the T10 vertebra with changes of would appears to be cement embolization extending into the paravertebral veins. There is a small radiolucency it T1 which may represent a hemangioma but is nonspecific. Attention on follow-up is recommended. Metastatic disease is difficult to exclude.  Thoracic spine DISH is present. Thoracic degenerative disease is mild. There is a T3-T4 RIGHT paracentral protrusion that produces mild central stenosis, just contacting the ventral cord and indenting. Unless otherwise noted below, central canal and foramina appear adequately patent.  T7-T8 also shows a RIGHT paracentral protrusion that produces mild central stenosis, contacting the ventral cord.  T9-T10 shows circumferential soft tissue attenuation around the thecal sac. There is near complete effacement of the subarachnoid space dorsal to the T10 vertebra. Artifact from embolization material and hardware degrades evaluation at this level. Bone cement is present in the LEFT side of the central canal, likely filling a basivertebral vein. The central stenosis appears severe based on scant visualization of contrast.  T10-T11 disc space also shows severe stenosis with tiny subarachnoid space. The thecal sac remains surrounded which may represent granulation tissue or epidural tumor.  T11-T12: LEFT eccentric disc bulging producing mild central stenosis. Posterior ligamentum flavum redundancy.  L1-L2: Only partially visualized. There appears to be disc bulging producing mild central stenosis.  Paraspinal soft tissues demonstrate prominent extrapleural fat and posterior pulmonary nodules in the superior segment of both lower lobes. The largest nodule in the superior segment of the LEFT lower lobe measures 12 mm (image 55 series 2). RIGHT nephrectomy incidentally noted. Low RIGHT peritracheal lymphadenopathy  is present measuring 26 mm x 32 mm (image 42 series 3). Subcarinal node enlarged, measuring 26 mm x 36 mm.  IMPRESSION: 1. Successful L3-L4 lumbar puncture for thoracic myelogram. 2. Postprocedural/postsurgical changes at T10 around the known metastatic lesion. Uncomplicated posterior rod and pedicle screw fixation extending from T8 through T12. Residual severe spinal stenosis extending from T9 through T11. 3. Mild thoracic spondylosis. 4. Faint lucency in the anterior T1 vertebra is nonspecific. This could represent hemangioma. Metastatic disease could also produce this appearance. Attention on follow-up recommended. 5. Mediastinal adenopathy/metastatic disease. Multiple pulmonary nodules compatible with hematogenous metastases.   Electronically Signed   By: Dereck Ligas M.D.   On: 12/13/2013 13:31   US Venous Img Lower Bilateral  01/11/2014   CLINICAL DATA:  Bilateral leg swelling.  EXAM: BILATERAL LOWER EXTREMITY VENOUS DOPPLER ULTRASOUND  TECHNIQUE: Gray-scale sonography with graded compression, as well as color Doppler and duplex ultrasound were performed to evaluate the lower extremity deep venous systems from the level of the common femoral vein and including the common femoral, femoral, profunda femoral, popliteal and calf veins including the posterior tibial, peroneal and gastrocnemius veins when visible. The superficial great saphenous vein was also interrogated. Spectral Doppler was utilized to evaluate flow at rest and with distal augmentation maneuvers in the common femoral, femoral and popliteal veins.  COMPARISON:  None.  FINDINGS: RIGHT LOWER EXTREMITY  Normal compressibility, augmentation and color Doppler flow in the right common femoral vein, right femoral vein and right popliteal vein. Visualized right great saphenous vein is patent. Visualized right calf veins are patent.  LEFT LOWER EXTREMITY  Normal compressibility, augmentation and color Doppler flow in the left common femoral vein, left  femoral vein and left popliteal vein. Visualized left great saphenous vein is patent. Visualized left calf veins are patent.  Other Findings: Soft tissue edema in the left lower calf and left ankle.  IMPRESSION: Negative for deep vein thrombosis in the lower extremities.   Electronically Signed   By: Markus Daft M.D.   On: 01/11/2014 16:49   Dg Chest Portable 1 View  01/11/2014   CLINICAL DATA:  Leg swelling.  Pain.  EXAM: PORTABLE CHEST - 1 VIEW  COMPARISON:  One-view chest 12/01/2013.  FINDINGS: The heart is enlarged. There is no significant edema or effusion suggest failure. The lung volumes are low. Thoracolumbar fusion is again noted.  IMPRESSION: 1. No acute cardiopulmonary disease or significant interval change. 2. Stable cardiomegaly without failure.   Electronically Signed   By: Lawrence Santiago M.D.   On: 01/11/2014 16:37     Waldemar Dickens, MD Family Medicine 01/11/2014, 7:08 PM [place]

## 2014-01-11 NOTE — ED Provider Notes (Signed)
CSN: [id number]     Arrival date & time 01/11/14  1407 History  This chart was scribed for Ephraim Hamburger, MD by Irene Pap, ED Scribe. This patient was seen in room APA11/APA11 and patient care was started at 2:38 PM.      Chief Complaint  Patient presents with  . Leg Swelling   The history is provided by the patient. No language interpreter was used.  HPI Comments: [name] is a 73 y.o. male with a history of CHF and MI who presents to the Emergency Department complaining of LLE swelling onset 3 days ago. Pt was brought in by EMS. Patient reports a history of leg swelling. Patient states that he had surgery for his back 3 weeks ago and recently returned home 3 days ago. He states that he has been unable to get off the couch since returning home and has had painful bilateral leg swelling ever since. He states that he is only able to get off the couch with assistance and feels weakness all over. He reports that he called his PCP who directed him to the ED. He states that his appetite is dependent on whether or not he can find acces to food. Patient denies chest pain, fever, cough, SOB, diarrhea, dysuria, or pain anywhere else. He reports a history of DM, back surgery, and artery surgery. He states that he has been taking fluid pills to no relief.   Past Medical History  Diagnosis Date  . Coronary artery disease   . Hypertension   . High cholesterol   . Diabetes mellitus   . LBBB (left bundle branch block)   . Paroxysmal atrial fibrillation   . Pneumonia 10-2012  . CHF (congestive heart failure)   . Renal disorder     kidney cancer  . Hepatitis many yrs ago    no current liver problems per pt. reportedly Hepatitis C  . Disc     6 discs loswer back, back brace prn  . Anemia   . Cancer 2010    right nephrectomy  . 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 Complication of anesthesia     years ago went into a coma  . Sleep apnea     cpap setting of 4, or uses oxygen 4 liters per [place]    Past Surgical History  Procedure Laterality Date  . Nephrectomy Right 2010  . Esophagogastroduodenoscopy Left 10/02/2012    Dr. Paulita Fujita: normal   . Colonoscopy Left 10/03/2012    Dr. Michail Sermon: poorly prepped colon, lipomatous ileocecal valve. Path revealed focal acute inflammation with question of NSAID-induced injury.   . Back surgery  1960's and 1970's    lower x 2  . Left ring finger traumatic amputation      jumped out of truck, finger caught  . Coronary angioplasty with stent placement  yrs ago    x 5 or 6--DES TO LAD AND RAMUS  . Radiology with anesthesia N/A 11/29/2013    Procedure: RADIOLOGY WITH ANESTHESIA;  Surgeon: Consuella Lose, MD;  Location: [facility];  Service: Radiology;  Laterality: N/A;  . Posterior lumbar fusion 4 level N/A 11/30/2013    Procedure: THORACIC TEN LAMINECTOMY DECOMPRESSION WITH THORACIC EIGHT-THORACIC TWELVE INSTRUMENTATION;  Surgeon: Consuella Lose, MD;  Location: [facility];  Service: Neurosurgery;  Laterality: N/A;   Family History  Problem Relation Age of Onset  . Hyperlipidemia Mother   . Diabetes Mother   . Heart attack Father   .  Colon cancer Neg Hx   . Crohn's disease Neg Hx   . Ulcerative colitis Neg Hx    History  Substance Use Topics  . Smoking status: Passive Smoke Exposure - Never Smoker  . Smokeless tobacco: Never Used  . Alcohol Use: No     Comment: quit ETOH 25-30 years ago    Review of Systems  Constitutional: Positive for appetite change. Negative for fever.  Respiratory: Negative for cough and shortness of breath.   Cardiovascular: Positive for leg swelling. Negative for chest pain.  Gastrointestinal: Negative for diarrhea.  Genitourinary: Negative for dysuria.  Musculoskeletal: Positive for arthralgias.  All other systems reviewed and are negative.  Allergies  Other  Home Medications   Prior to Admission medications   Medication Sig Start Date End Date Taking? Authorizing Provider  amiodarone (PACERONE) 200 MG  tablet Take 200 mg by mouth daily. For a-fib    Historical Provider, MD  B-D ULTRAFINE III SHORT PEN 31G X 8 MM MISC  11/02/13   Historical Provider, MD  COMBIVENT RESPIMAT 20-100 MCG/ACT AERS respimat Inhale 1 puff into the lungs every 6 (six) hours as needed for shortness of breath.  08/09/13   Historical Provider, MD  dexamethasone (DECADRON) 4 MG tablet Take 1 tablet (4 mg total) by mouth 2 (two) times daily. Taper to 4mg  daily in the first week of August. Check glucose as this medication can elevate it. 12/28/13   Eppie Gibson, MD  diltiazem [facility]) 360 MG 24 hr capsule Take 360 mg by mouth daily. 10/04/13   Historical Provider, MD  docusate sodium 100 MG CAPS Take 100 mg by mouth 2 (two) times daily. 12/05/13   Ophelia Charter, MD  furosemide (LASIX) 40 MG tablet Take 40 mg by mouth daily.    Historical Provider, MD  HYDROcodone-acetaminophen (NORCO) 10-325 MG per tablet Take 1 tablet by mouth every 6 (six) hours as needed (pain). 12/20/13   Theodis Blaze, MD  Insulin Glargine (LANTUS) 100 UNIT/ML Solostar Pen Inject 40 Units into the skin at bedtime. 01/27/13   Maricela Curet, MD  ipratropium-albuterol (DUONEB) 0.5-2.5 (3) MG/3ML SOLN Take 3 mLs by nebulization every 4 (four) hours as needed. 12/20/13   Theodis Blaze, MD  methadone (DOLOPHINE) 10 MG tablet Take 1 tablet (10 mg total) by mouth at bedtime as needed for moderate pain. 12/20/13   Theodis Blaze, MD  metoprolol (LOPRESSOR) 50 MG tablet Take 50 mg by mouth 2 (two) times daily.     Historical Provider, MD  nitroGLYCERIN (NITRODUR - DOSED IN MG/24 HR) 0.2 mg/hr patch Place 0.2 mg onto the skin daily. 08/16/13   Jettie Booze, MD  nitroGLYCERIN (NITROSTAT) 0.4 MG SL tablet Place 1 tablet (0.4 mg total) under the tongue every 5 (five) minutes as needed. Chest pain 08/16/13   Jettie Booze, MD  pantoprazole (PROTONIX) 40 MG tablet Take 40 mg by mouth daily.    Historical Provider, MD  promethazine (PHENERGAN) 25 MG tablet Take 1  tablet (25 mg total) by mouth every 6 (six) hours as needed for nausea or vomiting. 11/12/13   Eppie Gibson, MD  tamsulosin (FLOMAX) 0.4 MG CAPS capsule Take 0.4 mg by mouth daily.    Historical Provider, MD   BP 132/89  Pulse 95  Temp(Src) 98.5 F (36.9 C) (Oral)  Resp 18  SpO2 95%  Physical Exam  Nursing note and vitals reviewed. Constitutional: He is oriented to person, place, and time. He appears well-developed and  well-nourished.  HENT:  Head: Normocephalic and atraumatic.  Eyes: EOM are normal.  Neck: Normal range of motion. Neck supple.  Cardiovascular: Normal rate.   Pulmonary/Chest: Effort normal.  Musculoskeletal: Normal range of motion. He exhibits edema.  Bilateral lower extremity pitting edema feet to proximal lower leg.   Neurological: He is alert and oriented to person, place, and time.  Diffusely weak.   Skin: Skin is warm and dry.  Psychiatric: He has a normal mood and affect. His behavior is normal.    ED Course  Procedures (including critical care time) DIAGNOSTIC STUDIES: Oxygen Saturation is 95% on room air, normal by my interpretation.    COORDINATION OF CARE: 2:43 PM-Discussed treatment plan which includes labs with pt at bedside and pt agreed to plan.   Labs Review Labs Reviewed  CBC WITH DIFFERENTIAL - Abnormal; Notable for the following:    WBC 3.1 (*)    Hemoglobin 12.7 (*)    HCT 37.6 (*)    RDW 17.4 (*)    Platelets 39 (*)    Neutrophils Relative % 85 (*)    Lymphocytes Relative 8 (*)    Lymphs Abs 0.2 (*)    All other components within normal limits  COMPREHENSIVE METABOLIC PANEL - Abnormal; Notable for the following:    Sodium 130 (*)    Chloride 90 (*)    Glucose, Bld 315 (*)    BUN 25 (*)    Creatinine, Ser 1.39 (*)    Calcium 8.1 (*)    Total Protein 5.5 (*)    Albumin 2.2 (*)    AST 67 (*)    ALT 78 (*)    GFR calc non Af Amer 50 (*)    GFR calc Af Amer 58 (*)    All other components within normal limits  URINALYSIS,  ROUTINE W REFLEX MICROSCOPIC - Abnormal; Notable for the following:    Color, Urine AMBER (*)    Glucose, UA 500 (*)    Hgb urine dipstick LARGE (*)    Bilirubin Urine SMALL (*)    Protein, ur 100 (*)    Urobilinogen, UA 2.0 (*)    All other components within normal limits  PRO B NATRIURETIC PEPTIDE - Abnormal; Notable for the following:    Pro B Natriuretic peptide (BNP) 1255.0 (*)    All other components within normal limits  URINE MICROSCOPIC-ADD ON - Abnormal; Notable for the following:    Squamous Epithelial / LPF FEW (*)    Bacteria, UA MANY (*)    Casts GRANULAR CAST (*)    All other components within normal limits  CBC WITH DIFFERENTIAL - Abnormal; Notable for the following:    WBC 3.1 (*)    RDW 17.5 (*)    Platelets 44 (*)    Neutrophils Relative % 82 (*)    Lymphocytes Relative 10 (*)    Lymphs Abs 0.3 (*)    All other components within normal limits  GLUCOSE, CAPILLARY - Abnormal; Notable for the following:    Glucose-Capillary 319 (*)    All other components within normal limits  URINE CULTURE  TROPONIN I  COMPREHENSIVE METABOLIC PANEL  CBC    Imaging Review US Venous Img Lower Bilateral  01/11/2014   CLINICAL DATA:  Bilateral leg swelling.  EXAM: BILATERAL LOWER EXTREMITY VENOUS DOPPLER ULTRASOUND  TECHNIQUE: Gray-scale sonography with graded compression, as well as color Doppler and duplex ultrasound were performed to evaluate the lower extremity deep venous systems from the level of  the common femoral vein and including the common femoral, femoral, profunda femoral, popliteal and calf veins including the posterior tibial, peroneal and gastrocnemius veins when visible. The superficial great saphenous vein was also interrogated. Spectral Doppler was utilized to evaluate flow at rest and with distal augmentation maneuvers in the common femoral, femoral and popliteal veins.  COMPARISON:  None.  FINDINGS: RIGHT LOWER EXTREMITY  Normal compressibility, augmentation and  color Doppler flow in the right common femoral vein, right femoral vein and right popliteal vein. Visualized right great saphenous vein is patent. Visualized right calf veins are patent.  LEFT LOWER EXTREMITY  Normal compressibility, augmentation and color Doppler flow in the left common femoral vein, left femoral vein and left popliteal vein. Visualized left great saphenous vein is patent. Visualized left calf veins are patent.  Other Findings: Soft tissue edema in the left lower calf and left ankle.  IMPRESSION: Negative for deep vein thrombosis in the lower extremities.   Electronically Signed   By: Markus Daft M.D.   On: 01/11/2014 16:49   Dg Chest Portable 1 View  01/11/2014   CLINICAL DATA:  Leg swelling.  Pain.  EXAM: PORTABLE CHEST - 1 VIEW  COMPARISON:  One-view chest 12/01/2013.  FINDINGS: The heart is enlarged. There is no significant edema or effusion suggest failure. The lung volumes are low. Thoracolumbar fusion is again noted.  IMPRESSION: 1. No acute cardiopulmonary disease or significant interval change. 2. Stable cardiomegaly without failure.   Electronically Signed   By: Lawrence Santiago M.D.   On: 01/11/2014 16:37     EKG Interpretation   Date/Time:  Tuesday January 11 2014 14:12:52 EDT Ventricular Rate:  95 PR Interval:  172 QRS Duration: 159 QT Interval:  428 QTC Calculation: 538 R Axis:   17 Text Interpretation:  Sinus rhythm Left bundle branch block Baseline  wander in lead(s) V3 No significant change since 2014 Confirmed by  Enoch Moffa  MD, [place] ([id number]) on 01/11/2014 2:25:18 PM      MDM   Final diagnoses:  Secondary malignant neoplasm of bone and bone marrow  Chronic atrial fibrillation  Cancer of kidney, unspecified laterality  CKD (chronic kidney disease) stage 3, GFR 30-59 ml/min  Chronic obstructive pulmonary disease, unspecified COPD, unspecified chronic bronchitis type  Hypertension associated with diabetes  IDDM (insulin dependent diabetes mellitus)  Weakness     Patient with generalized weakness. This appears to be an acute on chronic problem, but he is unable to get out of bed and had a fall without injury today. Also has new pitting edema to legs. No DVT, has elevated BNP without evidence of pulmonary edema. Will start on lasix, and will need re-admission and possibly NH placement.   I personally performed the services described in this documentation, which was scribed in my presence. The recorded information has been reviewed and is accurate.     Ephraim Hamburger, MD 01/11/14 2152

## 2014-01-11 NOTE — ED Notes (Signed)
Report given to [facility] [facility], RN unit 300.

## 2014-01-11 NOTE — ED Notes (Signed)
Patient done with dinner tray. Lab at bedside to draw blood.

## 2014-01-11 NOTE — ED Notes (Signed)
Pt BIB EMS for c/o LLE swelling. Pt reports swelling x3 days. PMH of MI and CHF. Denies CP. 18g Lt FA started by EMS. Pt has nitro patch to LUE.

## 2014-01-11 NOTE — Telephone Encounter (Signed)
Friend donna calling to report that patient fell yesterday, d/t swollen feet, unable to walk. Unable to get him to his appt today. Per dr Alen Blew, if unable to walk, he should to report to the E.R. Butch Penny verbalized understanding.

## 2014-01-11 NOTE — ED Notes (Signed)
Patient eating dinner tray prior to transport to floor.

## 2014-01-11 NOTE — ED Notes (Signed)
Attempted to call patient's friend Butch Penny per request to inform of bed assignment. [phone], busy signal each time. Patient informed.

## 2014-01-12 ENCOUNTER — Telehealth: Payer: Self-pay | Admitting: Oncology

## 2014-01-12 DIAGNOSIS — I517 Cardiomegaly: Secondary | ICD-10-CM

## 2014-01-12 LAB — COMPREHENSIVE METABOLIC PANEL
ALK PHOS: 65 U/L (ref 39–117)
ALT: 68 U/L — AB (ref 0–53)
AST: 43 U/L — ABNORMAL HIGH (ref 0–37)
Albumin: 2 g/dL — ABNORMAL LOW (ref 3.5–5.2)
Anion gap: 13 (ref 5–15)
BUN: 24 mg/dL — AB (ref 6–23)
CALCIUM: 7.6 mg/dL — AB (ref 8.4–10.5)
CO2: 29 meq/L (ref 19–32)
Chloride: 93 mEq/L — ABNORMAL LOW (ref 96–112)
Creatinine, Ser: 1.41 mg/dL — ABNORMAL HIGH (ref 0.50–1.35)
GFR calc non Af Amer: 49 mL/min — ABNORMAL LOW (ref >90)
GFR, EST AFRICAN AMERICAN: 57 mL/min — AB (ref >90)
GLUCOSE: 338 mg/dL — AB (ref 70–99)
Potassium: 4.3 mEq/L (ref 3.7–5.3)
Sodium: 135 mEq/L — ABNORMAL LOW (ref 137–147)
TOTAL PROTEIN: 5.1 g/dL — AB (ref 6.0–8.3)
Total Bilirubin: 0.7 mg/dL (ref 0.3–1.2)

## 2014-01-12 LAB — CBC
HCT: 34.2 % — ABNORMAL LOW (ref 39.0–52.0)
Hemoglobin: 11.3 g/dL — ABNORMAL LOW (ref 13.0–17.0)
MCH: 26.2 pg (ref 26.0–34.0)
MCHC: 33 g/dL (ref 30.0–36.0)
MCV: 79.4 fL (ref 78.0–100.0)
PLATELETS: 39 10*3/uL — AB (ref 150–400)
RBC: 4.31 MIL/uL (ref 4.22–5.81)
RDW: 17.4 % — ABNORMAL HIGH (ref 11.5–15.5)
WBC: 2.7 10*3/uL — ABNORMAL LOW (ref 4.0–10.5)

## 2014-01-12 LAB — GLUCOSE, CAPILLARY
Glucose-Capillary: 305 mg/dL — ABNORMAL HIGH (ref 70–99)
Glucose-Capillary: 193 mg/dL — ABNORMAL HIGH (ref 70–99)
Glucose-Capillary: 279 mg/dL — ABNORMAL HIGH (ref 70–99)
Glucose-Capillary: 213 mg/dL — ABNORMAL HIGH (ref 70–99)

## 2014-01-12 MED ORDER — INSULIN GLARGINE 100 UNIT/ML ~~LOC~~ SOLN
20.0000 [IU] | Freq: Once | SUBCUTANEOUS | Status: AC
  Administered 2014-01-12: 20 [IU] via SUBCUTANEOUS
  Filled 2014-01-12: qty 0.2

## 2014-01-12 MED ORDER — GLUCERNA SHAKE PO LIQD
237.0000 mL | Freq: Every day | ORAL | Status: DC
  Administered 2014-01-12: 237 mL via ORAL

## 2014-01-12 MED ORDER — DILTIAZEM HCL ER COATED BEADS 180 MG PO CP24
360.0000 mg | ORAL_CAPSULE | Freq: Every day | ORAL | Status: DC
Start: 2014-01-12 — End: 2014-01-13
  Administered 2014-01-12 – 2014-01-13 (×2): 360 mg via ORAL
  Filled 2014-01-12 (×2): qty 2

## 2014-01-12 MED ORDER — INSULIN GLARGINE 100 UNIT/ML ~~LOC~~ SOLN
40.0000 [IU] | Freq: Every day | SUBCUTANEOUS | Status: DC
  Administered 2014-01-12: 40 [IU] via SUBCUTANEOUS
  Filled 2014-01-12 (×2): qty 0.4

## 2014-01-12 MED ORDER — CETYLPYRIDINIUM CHLORIDE 0.05 % MT LIQD
7.0000 mL | Freq: Two times a day (BID) | OROMUCOSAL | Status: DC
  Administered 2014-01-12 – 2014-01-13 (×3): 7 mL via OROMUCOSAL

## 2014-01-12 NOTE — Evaluation (Signed)
Physical Therapy Evaluation Patient Details Name: [name] MRN: [id number] DOB: [birth year] Today's Date: 01/12/2014   History of Present Illness  [name] is a 73 y.o. male with a history of CHF and MI who presents to the Emergency Department complaining of LLE swelling onset 3 days ago. Pt was brought in by EMS. Patient reports a history of leg swelling. Patient states that he had surgery for his back 3 weeks ago and recently returned home 3 days ago. He states that he has been unable to get off the couch since returning home and has had painful bilateral leg swelling ever since. He states that he is only able to get off the couch with assistance and feels weakness all over. He reports that he called his PCP who directed him to the ED. He states that his appetite is dependent on whether or not he can find acces to food. Patient denies chest pain, fever, cough, SOB, diarrhea, dysuria, or pain anywhere else. He reports a history of DM, back surgery, and artery surgery. He states that he has been taking fluid pills to no relief.   Per chart review, pt was to undergo spinal angio of T10 with embolization (pt unable to report date of surgery).  Per pt, he returned home after surgery, though slide off the couch and was unable to stand so was transferred to SNF for rehab for ~4 weeks.  Pt has been home for a couple of days prior to transferring to hospital secondary to weakness.    Clinical Impression  Pt is a 73 year old male who presents to physical therapy with dx of generalized weakness.  Pt underwent back surgery in recent history (unable to report specific date) and was transferred to SNF for rehab.  Per pt, he was mod (I) with bed mobility skills, transfers, and able to ambulate around facility x2 with use of RW prior to discharge from SNF.  When at home, pt reports increasing weakness and difficulty getting up and walking around home.  During evaluation, pt was mod (I) with bed mobility skills,  supervision and use of RW for transfers, and min guard and use of RW for gait of 12 feet.  Gait distance limited secondary to complaints of fatigue.  Recommend continued PT to address strengthening, activity tolerance, and balance for improved functional mobility skills with transition to HHPT services at private residence or ALF for continued rehab.  No DME recommendations at this time.      Follow Up Recommendations  (HHPT at private residence vs. ALF)    Equipment Recommendations  None recommended by PT       Precautions / Restrictions Precautions Precautions: Fall Precaution Comments: Limit bending, twisting, lifting due to recent back surgery Restrictions Weight Bearing Restrictions: No      Mobility  Bed Mobility Overal bed mobility: Modified Independent Bed Mobility: Supine to Sit;Sit to Supine     Supine to sit: Modified independent (Device/Increase time);HOB elevated Sit to supine: Modified independent (Device/Increase time);HOB elevated   General bed mobility comments: HOB slightly elevated as pt reports he sleeps in recliner that is not all the way flat.  Increased complaints of back pain when attempting to lower HOB.  Transfers Overall transfer level: Needs assistance Equipment used: Rolling walker (2 wheeled) Transfers: Sit to/from Stand Sit to Stand: Supervision         General transfer comment: VC for hand placement on bed during sit -> stand transfer.   Ambulation/Gait Ambulation/Gait assistance:  Min guard Ambulation Distance (Feet): 12 Feet Assistive device: Rolling walker (2 wheeled) Gait Pattern/deviations: Step-through pattern;Decreased step length - right;Decreased step length - left   Gait velocity interpretation: Below normal speed for age/gender General Gait Details: Pt requested seated rest break after 12 feet secondary to fatigue.      Balance Overall balance assessment: History of Falls;Needs assistance Sitting-balance support: Feet  supported;No upper extremity supported Sitting balance-Leahy Scale: Good     Standing balance support: During functional activity;Bilateral upper extremity supported Standing balance-Leahy Scale: Fair                               Pertinent Vitals/Pain No pain reported prior to PT evaluation.     Home Living Family/patient expects to be discharged to:: Other (Comment) (ALF vs. private residence) Living Arrangements: Alone Available Help at Discharge: Friend(s);Available PRN/intermittently (Friends PRN, pt reports aides x2 have been helping at the house (unable to report how long they are helping per day)) Type of Home: Mobile home Home Access: Stairs to enter Entrance Stairs-Rails: Can reach both Entrance Stairs-Number of Steps: 4 Home Layout: One level Home Equipment: Clinical cytogeneticist - 2 wheels;Wheelchair - manual;Cane - single point Additional Comments: Shower tub    Prior Function Level of Independence: Independent with assistive device(s)   Gait / Transfers Assistance Needed: Pt reports after leaving SNF, he was mod (I) with bed mobility skills, transfers, and ambulation around the facility x2 with use of RW  ADL's / Homemaking Assistance Needed: Pt reports he has been taking sponge baths.            Extremity/Trunk Assessment   Upper Extremity Assessment: Defer to OT evaluation           Lower Extremity Assessment: Generalized weakness         Communication   Communication: No difficulties  Cognition Arousal/Alertness: Awake/alert Behavior During Therapy: WFL for tasks assessed/performed Overall Cognitive Status: Within Functional Limits for tasks assessed                               Assessment/Plan    PT Assessment Patient needs continued PT services  PT Diagnosis Difficulty walking;Generalized weakness   PT Problem List Decreased strength;Decreased activity tolerance;Decreased mobility;Decreased safety awareness  PT  Treatment Interventions DME instruction;Gait training;Functional mobility training;Therapeutic activities;Therapeutic exercise;Balance training;Patient/family education;Wheelchair mobility training;Stair training   PT Goals (Current goals can be found in the Care Plan section) Acute Rehab PT Goals Patient Stated Goal: get stronger PT Goal Formulation: With patient Time For Goal Achievement: 01/26/14 Potential to Achieve Goals: Good    Frequency Min 3X/week    End of Session Equipment Utilized During Treatment: Gait belt Activity Tolerance: Patient tolerated treatment well Patient left: in bed;with call bell/phone within reach;with bed alarm set           Time: 4315-4008 PT Time Calculation (min): 25 min   Charges:   PT Evaluation $Initial PT Evaluation Tier I: 1 Procedure          Nakia Remmers 01/12/2014, 8:52 AM

## 2014-01-12 NOTE — Progress Notes (Signed)
[id number] 

## 2014-01-12 NOTE — Progress Notes (Signed)
UR review complete.  

## 2014-01-12 NOTE — Clinical Social Work Psychosocial (Signed)
Clinical Social Work Department BRIEF PSYCHOSOCIAL ASSESSMENT 01/12/2014  Patient:  [name], [name]     Account Number:  [id number]     [place] date:  01/11/2014  Clinical Social Worker:  Wyatt Haste  Date/Time:  01/12/2014 10:50 AM  Referred by:  CSW  Date Referred:  01/12/2014 Referred for  ALF Placement   Other Referral:   Interview type:  Patient Other interview type:   friend- [name]    PSYCHOSOCIAL DATA Living Status:  ALONE Admitted from facility:   Level of care:   Primary support name:  [name] Primary support relationship to patient:  FRIEND Degree of support available:   supportive per pt    CURRENT CONCERNS Current Concerns  Post-Acute Placement   Other Concerns:    SOCIAL WORK ASSESSMENT / PLAN CSW met with pt at bedside. Pt alert and oriented x3, but somewhat confused on timeline. States he came to ED yesterday due to swelling in his legs. Pt has lived alone for the past 30 years. He reports he did well at home until after his surgery on 6/22. Pt returned home after surgery and was very weak. He went back to the hospital and was placed at [facility] for rehab while he continued radiation treatments. Pt d/c home on Friday. CSW spoke with pt's best support, a friend, [name] with pt's permission. [name] provided dates and some additional information. Pt had an appointment with his oncologist yesterday, but had to come to ED instead. Pt and [name] indicates pt has completed treatment. Pt said he had kidney cancer several years ago and it has been "downhill since then." [name] has provided assistance to pt with bills, meals, and bathing if needed. Pt admits he still feels weak, but was told he would for several weeks. PT evaluated pt today and recommendation is for home health vs ALF. Discussed possible placement with pt and he refuses. He states he has been to a facility already and is not interested in return. His concerns about placement were the food and "old  people." Pt has had home health PT/OT/RN with [facility] per [name]. She indicates that home health was assisting them on getting an aid at home to help pt as well. [name] aware of pt's d/c plan to return home. Pt has a walker, cane, and wheelchair at home.   Assessment/plan status:  Referral to [employer] Other assessment/ plan:   Information/referral to community resources:   CM to resume home health    PATIENT'S/FAMILY'S RESPONSE TO PLAN OF CARE: Pt refuses to consider placement of any kind. CSW will sign off, but can be reconsulted if needed.       Benay Pike, [place]

## 2014-01-12 NOTE — Progress Notes (Signed)
INITIAL NUTRITION ASSESSMENT  DOCUMENTATION CODES Per approved criteria  -Obesity Unspecified   INTERVENTION: Encourage meal intake  Add Glucerna q hs (220 kcal, 9.9 gr protein)  RD will follow for nutrition care  NUTRITION DIAGNOSIS: Increased protein-energy needs related to chronic illness as evidenced by nutrition guidelines and pt medical hx.   Goal: Pt to meet >/= 90% of their estimated nutrition needs    Monitor:  Po intake, labs and wt trends   Reason for Assessment: Malnutrition Screen Score =  2  73 y.o. male   ASSESSMENT: Pt has renal cell cancer with metastasis, insulin dependent diabetes (last A1C on 12/16/13 was 7.5%), Coronary artery disease, COPD and recurrent falls. Pt presents with c/o increasing lower extremity weakness.  Pt has weight loss of 8% in <90 days.  At least part of his weight loss is likely related to his stay at SNF (change of eating schedule, food choices, etc) which he does not want to repeat. He also has a hx of CHF and is on diuretics therapy. Pt affirms good appetite and his current meal intake ranges 50-100%. He feeds himself. There were no physical signs of malnutrition observed at this time.   Height: Ht Readings from Last 1 Encounters:  01/11/14 5\' 7"  (1.702 m)    Weight: Wt Readings from Last 1 Encounters:  01/11/14 207 lb 7.3 oz (94.1 kg)    Ideal Body Weight: 148# (67 kg)  % Ideal Body Weight: 140%  Wt Readings from Last 10 Encounters:  01/11/14 207 lb 7.3 oz (94.1 kg)  01/04/14 209 lb (94.802 kg)  12/27/13 190 lb 14.4 oz (86.592 kg)  12/24/13 208 lb (94.348 kg)  12/16/13 224 lb (101.606 kg)  12/08/13 214 lb 4.8 oz (97.206 kg)  12/03/13 223 lb (101.152 kg)  12/03/13 223 lb (101.152 kg)  12/03/13 223 lb (101.152 kg)  11/25/13 224 lb (101.606 kg)    Usual Body Weight: 224#  % Usual Body Weight: 92%  BMI:  Body mass index is 32.48 kg/(m^2).obesity class II  Estimated Nutritional Needs: Kcal: 6962-9528 Protein:  90-99 gr Fluid: >2000 ml daily  Skin: intact  Diet Order: Carb Control  EDUCATION NEEDS: -No education needs identified at this time   Intake/Output Summary (Last 24 hours) at 01/12/14 1638 Last data filed at 01/12/14 1200  Gross per 24 hour  Intake    480 ml  Output    700 ml  Net   -220 ml    Last BM: 01/10/14  Labs:   Recent Labs Lab 01/11/14 1447 01/12/14 0516  NA 130* 135*  K 4.5 4.3  CL 90* 93*  CO2 29 29  BUN 25* 24*  CREATININE 1.39* 1.41*  CALCIUM 8.1* 7.6*  GLUCOSE 315* 338*    CBG (last 3)   Recent Labs  01/11/14 2113 01/12/14 0740 01/12/14 1241  GLUCAP 319* 305* 193*    Scheduled Meds: . amiodarone  200 mg Oral Daily  . antiseptic oral rinse  7 mL Mouth Rinse BID  . dexamethasone  8 mg Intravenous Q24H  . diltiazem  360 mg Oral Daily  . furosemide  40 mg Oral Daily  . insulin aspart  0-15 Units Subcutaneous TID WC  . insulin aspart  0-5 Units Subcutaneous QHS  . insulin glargine  40 Units Subcutaneous QHS  . metoprolol  50 mg Oral BID  . pantoprazole  40 mg Oral Daily  . sodium chloride  3 mL Intravenous Q12H  . tamsulosin  0.4 mg Oral  Daily    Continuous Infusions: . sodium chloride 100 mL/hr at 01/11/14 2140    Past Medical History  Diagnosis Date  . Coronary artery disease   . Hypertension   . High cholesterol   . Diabetes mellitus   . LBBB (left bundle branch block)   . Paroxysmal atrial fibrillation   . Pneumonia 10-2012  . CHF (congestive heart failure)   . Renal disorder     kidney cancer  . Hepatitis many yrs ago    no current liver problems per pt. reportedly Hepatitis C  . Disc     6 discs loswer back, back brace prn  . Anemia   . Cancer 2010    right nephrectomy  . 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 Complication of anesthesia     years ago went into a coma  . Sleep apnea     cpap setting of 4, or uses oxygen 4 liters per [place]    Past Surgical History  Procedure Laterality Date  . Nephrectomy Right  2010  . Esophagogastroduodenoscopy Left 10/02/2012    Dr. Paulita Fujita: normal   . Colonoscopy Left 10/03/2012    Dr. Michail Sermon: poorly prepped colon, lipomatous ileocecal valve. Path revealed focal acute inflammation with question of NSAID-induced injury.   . Back surgery  1960's and 1970's    lower x 2  . Left ring finger traumatic amputation      jumped out of truck, finger caught  . Coronary angioplasty with stent placement  yrs ago    x 5 or 6--DES TO LAD AND RAMUS  . Radiology with anesthesia N/A 11/29/2013    Procedure: RADIOLOGY WITH ANESTHESIA;  Surgeon: Consuella Lose, MD;  Location: [facility];  Service: Radiology;  Laterality: N/A;  . Posterior lumbar fusion 4 level N/A 11/30/2013    Procedure: THORACIC TEN LAMINECTOMY DECOMPRESSION WITH THORACIC EIGHT-THORACIC TWELVE INSTRUMENTATION;  Surgeon: Consuella Lose, MD;  Location: [facility];  Service: Neurosurgery;  Laterality: N/A;    Colman Cater MS,RD,CSG,LDN Office: [phone] Pager: [phone]

## 2014-01-12 NOTE — Progress Notes (Signed)
  Echocardiogram 2D Echocardiogram has been performed.  [place], [place] 01/12/2014, 3:22 PM

## 2014-01-12 NOTE — Evaluation (Signed)
Occupational Therapy Evaluation Patient Details Name: [name] MRN: [id number] DOB: Jun 29, 1940 Today's Date: 01/12/2014    History of Present Illness [name] is a 73 y.o. male with a history of CHF and MI who presents to the Emergency Department complaining of LLE swelling onset 3 days ago. Pt was brought in by EMS. Patient reports a history of leg swelling. Patient states that he had surgery for his back 3 weeks ago and recently returned home 3 days ago. He states that he has been unable to get off the couch since returning home and has had painful bilateral leg swelling ever since. He states that he is only able to get off the couch with assistance and feels weakness all over. He reports that he called his PCP who directed him to the ED. He states that his appetite is dependent on whether or not he can find acces to food. Patient denies chest pain, fever, cough, SOB, diarrhea, dysuria, or pain anywhere else. He reports a history of DM, back surgery, and artery surgery. He states that he has been taking fluid pills to no relief.   Per chart review, pt was to undergo spinal angio of T10 with embolization (pt unable to report date of surgery).  Per pt, he returned home after surgery, though slide off the couch and was unable to stand so was transferred to SNF for rehab for ~4 weeks.  Pt has been home for a couple of days prior to transferring to hospital secondary to weakness.     Clinical Impression   Pt is presenting to acute OT with above situation.  He has overall WFL ROM and strength in BUE, but had significantly decreased strength in R shoulder, which pt reports as being recent onset.  Pt is also verbalizing decreased ADL status due to a fear of falling.  Pt will benefit from an OT evaluation in the home for home safety and increasing functional use of R shoulder.  Recommend HHOT at this time.    Follow Up Recommendations  Home health OT    Equipment Recommendations  Tub/shower seat  (Pt reproted to OTR that he does not have shower seat - pt reported to PT that he does.)    Recommendations for Other Services       Precautions / Restrictions Precautions Precautions: Fall Precaution Comments: Limit bending, twisting, lifting due to recent back surgery Restrictions Weight Bearing Restrictions: No      Mobility Bed Mobility Overal bed mobility: Modified Independent Bed Mobility: Supine to Sit;Sit to Supine     Supine to sit: Modified independent (Device/Increase time);HOB elevated Sit to supine: Modified independent (Device/Increase time)    Transfers            Balance Overall balance assessment: Needs assistance Sitting-balance support: No upper extremity supported;Feet supported Sitting balance-Leahy Scale: Good                                  ADL Overall ADL's : Modified independent                                             Vision                     Perception     Praxis  Pertinent Vitals/Pain Pt with minimal back pain - repositioned     Hand Dominance Right   Extremity/Trunk Assessment Upper Extremity Assessment Upper Extremity Assessment: Generalized weakness;Overall [facility] for tasks assessed;RUE deficits/detail RUE Deficits / Details: Decreased strength in R shoulder flexion - difficulty in reaching full ROM and 3/5 strength. pt reportst that this has occurred since recent back surgery. WFL strength otherwise. RUE Sensation: decreased light touch   Lower Extremity Assessment Lower Extremity Assessment: Defer to PT evaluation       Communication Communication Communication: HOH   Cognition Arousal/Alertness: Awake/alert Behavior During Therapy: WFL for tasks assessed/performed Overall Cognitive Status: Within Functional Limits for tasks assessed                     General Comments       Exercises       Shoulder Instructions      Home Living Family/patient  expects to be discharged to:: Private residence Living Arrangements: Alone Available Help at Discharge: Friend(s);Available PRN/intermittently;Personal care attendant (Pt reports 2 HH nursing aides schedule to begin next week - 1 to stay all day and another to be there intermittently) Type of Home: Mobile home Home Access: Stairs to enter [employer] of Steps: 4 Entrance Stairs-Rails: Can reach both Home Layout: One level     Bathroom Shower/Tub: Tub/shower unit (Pt reports taking sponge baths)   Bathroom Toilet: Standard     Home Equipment: Environmental consultant - 2 wheels;Cane - single point;Wheelchair - manual   Additional Comments: Nurse, adult      Prior Functioning/Environment Level of Independence: Independent with assistive device(s)  Gait / Transfers Assistance Needed: Pt reports after leaving SNF, he was mod (I) with bed mobility skills, transfers, and ambulation around the facility x2 with use of RW ADL's / Homemaking Assistance Needed: Pt reports that he has been independnet in completing ADL tasks.  He tasks sponge baths due to a fear of falling.  he reports that his friend, Butch Penny, occassionally completes grocery shopping.        OT Diagnosis: Generalized weakness   OT Problem List: Decreased strength;Decreased activity tolerance;Decreased knowledge of use of DME or AE;Pain   OT Treatment/Interventions: Self-care/ADL training;Energy conservation;DME and/or AE instruction;Therapeutic activities;Patient/family education;Therapeutic exercise    OT Goals(Current goals can be found in the care plan section) Acute Rehab OT Goals Patient Stated Goal: get stronger OT Goal Formulation: With patient Time For Goal Achievement: 01/26/14 Potential to Achieve Goals: Good  OT Frequency: Min 2X/week   Barriers to D/C:            Co-evaluation              End of Session Equipment Utilized During Treatment: Oxygen  Activity Tolerance: Patient tolerated treatment  well Patient left: in bed;with call bell/phone within reach;with bed alarm set   Time: 1610-9604 OT Time Calculation (min): 30 min Charges:  OT General Charges $OT Visit: 1 Procedure OT Evaluation $Initial OT Evaluation Tier I: 1 Procedure G-Codes:     Bea Graff, MS, OTR/L [phone]  01/12/2014, 9:26 AM

## 2014-01-12 NOTE — Telephone Encounter (Signed)
Lft msg confirming labs/ov per pt's req to r/s due to being in the hospital, mailed pt copy of schedule...KJ

## 2014-01-12 NOTE — Progress Notes (Signed)
Inpatient Diabetes Program Recommendations  AACE/ADA: New Consensus Statement on Inpatient Glycemic Control (2013)  Target Ranges:  Prepandial:   less than 140 mg/dL      Peak postprandial:   less than 180 mg/dL (1-2 hours)      Critically ill patients:  140 - 180 mg/dL   Results for [name], [name] (MRN [id number]) as of 01/12/2014 11:06  Ref. Range 01/11/2014 21:13 01/12/2014 07:40  Glucose-Capillary Latest Range: 70-99 mg/dL 319 (H) 305 (H)    Diabetes history: DM2 Outpatient Diabetes medications: Lantus 40 units QHS Current orders for Inpatient glycemic control: Novolog 0-15 units AC, Novolog 0-5 units HS  Inpatient Diabetes Program Recommendations Insulin - Basal: Please consider ordering Lantus 40 units Q24H starting now.  Note: Spoke with patient about diabetes and home regimen for diabetes control. Patient reports that he is followed by his PCP for diabetes management and currently he takes Lantus 40 units QHS as an outpatient for diabetes control. Patient reports that he checks his blood glucose 3-4 times per week and it is usually in the 300's mg/dl. He states that he has symptoms of hypoglycemia when his blood glucose gets under 250 mg/dl. Discussed impact of nutrition, exercise, stress, sickness, and medications on diabetes control.  Discussed effect of Decadron on glucose control and patient states that he thought he wasn't on Decadron anymore but "not really sure since he has a medicine container with pills he is suppose to take each day and he is not exactly sure what each pill is". Encouraged patient to continue to work with is PCP for diabetes control and to check blood glucose more frequently so insulin adjustments could be made if necessary. Patient verbalized understanding of information discussed and she states that she has no further questions at this time related to diabetes.   Thanks, Barnie Alderman, RN, MSN, CCRN Diabetes Coordinator Inpatient Diabetes Program [phone]  (Team Pager) [phone] ([facility] office) [phone] [facility] office)

## 2014-01-13 ENCOUNTER — Encounter (HOSPITAL_COMMUNITY): Payer: Self-pay

## 2014-01-13 DIAGNOSIS — C7951 Secondary malignant neoplasm of bone: Principal | ICD-10-CM

## 2014-01-13 DIAGNOSIS — C7952 Secondary malignant neoplasm of bone marrow: Principal | ICD-10-CM

## 2014-01-13 LAB — GLUCOSE, CAPILLARY
Glucose-Capillary: 272 mg/dL — ABNORMAL HIGH (ref 70–99)
Glucose-Capillary: 358 mg/dL — ABNORMAL HIGH (ref 70–99)

## 2014-01-13 LAB — URINE CULTURE
CULTURE: NO GROWTH
Colony Count: NO GROWTH

## 2014-01-13 LAB — IRON AND TIBC
Iron: 44 ug/dL (ref 42–135)
Saturation Ratios: 24 % (ref 20–55)
TIBC: 184 ug/dL — ABNORMAL LOW (ref 215–435)
UIBC: 140 ug/dL (ref 125–400)

## 2014-01-13 LAB — CBC WITH DIFFERENTIAL/PLATELET
BASOS ABS: 0 10*3/uL (ref 0.0–0.1)
BASOS PCT: 0 % (ref 0–1)
EOS PCT: 0 % (ref 0–5)
Eosinophils Absolute: 0 10*3/uL (ref 0.0–0.7)
HEMATOCRIT: 31.8 % — AB (ref 39.0–52.0)
Hemoglobin: 10.6 g/dL — ABNORMAL LOW (ref 13.0–17.0)
Lymphocytes Relative: 10 % — ABNORMAL LOW (ref 12–46)
Lymphs Abs: 0.3 10*3/uL — ABNORMAL LOW (ref 0.7–4.0)
MCH: 26.8 pg (ref 26.0–34.0)
MCHC: 33.3 g/dL (ref 30.0–36.0)
MCV: 80.3 fL (ref 78.0–100.0)
MONO ABS: 0.1 10*3/uL (ref 0.1–1.0)
Monocytes Relative: 3 % (ref 3–12)
NEUTROS ABS: 2.2 10*3/uL (ref 1.7–7.7)
Neutrophils Relative %: 87 % — ABNORMAL HIGH (ref 43–77)
Platelets: 45 10*3/uL — ABNORMAL LOW (ref 150–400)
RBC: 3.96 MIL/uL — ABNORMAL LOW (ref 4.22–5.81)
RDW: 17.3 % — AB (ref 11.5–15.5)
WBC: 2.5 10*3/uL — ABNORMAL LOW (ref 4.0–10.5)

## 2014-01-13 LAB — FOLATE: Folate: 15.8 ng/mL

## 2014-01-13 LAB — BASIC METABOLIC PANEL
Anion gap: 9 (ref 5–15)
BUN: 23 mg/dL (ref 6–23)
CO2: 30 mEq/L (ref 19–32)
Calcium: 7.6 mg/dL — ABNORMAL LOW (ref 8.4–10.5)
Chloride: 98 mEq/L (ref 96–112)
Creatinine, Ser: 1.1 mg/dL (ref 0.50–1.35)
GFR calc Af Amer: 77 mL/min — ABNORMAL LOW (ref >90)
GFR calc non Af Amer: 66 mL/min — ABNORMAL LOW (ref >90)
Glucose, Bld: 316 mg/dL — ABNORMAL HIGH (ref 70–99)
POTASSIUM: 4 meq/L (ref 3.7–5.3)
Sodium: 137 mEq/L (ref 137–147)

## 2014-01-13 LAB — FERRITIN: FERRITIN: 292 ng/mL (ref 22–322)

## 2014-01-13 LAB — RETICULOCYTES
RBC.: 3.96 MIL/uL — AB (ref 4.22–5.81)
RETIC COUNT ABSOLUTE: 79.2 10*3/uL (ref 19.0–186.0)
RETIC CT PCT: 2 % (ref 0.4–3.1)

## 2014-01-13 LAB — VITAMIN B12: Vitamin B-12: 200 pg/mL — ABNORMAL LOW (ref 211–911)

## 2014-01-13 LAB — HIV ANTIBODY (ROUTINE TESTING W REFLEX): HIV: NONREACTIVE

## 2014-01-13 MED ORDER — DEXAMETHASONE 4 MG PO TABS
4.0000 mg | ORAL_TABLET | Freq: Two times a day (BID) | ORAL | Status: DC
  Administered 2014-01-13: 4 mg via ORAL
  Filled 2014-01-13 (×3): qty 1

## 2014-01-13 NOTE — Progress Notes (Signed)
[name], MCELHANNON                ACCOUNT NO.:  [id number]  MEDICAL RECORD NO.:  [id number]  LOCATION:                                 FACILITY:  PHYSICIAN:  Unk Lightning, MDDATE OF BIRTH:  Jun 10, 1941  DATE OF PROCEDURE:  01/12/2014 DATE OF DISCHARGE:                                PROGRESS NOTE   Very complex case.  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  Thoracic spine was addressed with cement embolization as well as decompression procedure __________ T8 through T12.  In reviewing old notes of neurosurgery, it was recommended this possibility to have radiation therapy to his cervical spine.  I am not certain that this was accomplished and __________ present.  He has a history of colitis of the ascending, transverse, and descending colon, which is quiescent at present.  Blood pressure under fair control, 142/68.  Reinstitution of long-acting insulins will bring glycemic control better than it has been in the last 12 hours, on short-acting insulins.  He is hemodynamically stable.  No angina or anginal equivalents.  He has an elevated proBNP.  We will obtain 2D echo to assess LV systolic and diastolic function as well as atrial chamber dimensions and valvular competence.  The patient presented with lower extremity weakness, currently moves both lower extremities very well.  He is quite obese.  He has difficulty ambulating.  This could be deconditioning which is partially why he presented with lower extremity weakness.  The patient specifically refuses to return to SNF, skilled nursing facility, and I urged him to reconsider it. He will  perhaps not do well at home alone and noncompliant with multiple medicines.  He fails to see the wisdom of this.  PLAN:  Right now is to obtain hemodynamic and glycemic control, see if radiation therapy was done to cervical spine, and obtain oncology consult as well as neurology consult to see if lower extremity bilateral weakness is multifactorial or neurologically mediated.     Unk Lightning, MD     RMD/MEDQ  D:  01/12/2014  T:  01/12/2014  Job:  [id number]

## 2014-01-13 NOTE — Consult Note (Signed)
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  Additionally, he is S/P cervical and thoracic spine lesions radiotherapy by Dr. Isidore Moos (30 Gy).  HISTORY OF PRESENT ILLNESS:   [name] is a 73 year old white man who was diagnosed with renal cell carcinoma, grade 2, following a right nephrectomy at [facility] on 06/19/2007.  He is S/P T10 laminectomy and resection of spinal tumor and decompression of the spinal cord on 11/30/2013.  Additionally, he is S/P cervical and thoracic spine lesions radiotherapy by Dr. Isidore Moos (30 Gy).  His primary oncologist, Dr. Alice Rieger, saw him last on 12/08/2013.  His impression and plan follow below: 1. Metastatic renal cell carcinoma with initial diagnosis in 2009. He presented with hematuria and underwent a right nephrectomy done at [facility]. Now has metastatic disease to the bone confirmed by biopsy. He is undergoing staging CT scan chest abdomen and pelvis next week for better staging. He will likely need systemic therapy in the form of TKI such as Votrient or Sutent. I anticipate starting this after the completion of his radiation therapy.  2. Cervical spine mass: He is scheduled to have radiation therapy in in the near future and once that is completed we'll evaluate systemic therapy at that time.  3. Bone directed therapy: He will be a good candidate for Xgeva which we will discuss in future visits.  4. Followup: Will be in one month after he  completes radiation therapy to initiate systemic therapy at that time.  [facility] was [facility]2014 after presenting to the ED with leg swelling, brought by EMS.  According to the ED note, he called his primary care provided who guided him to the ED.  Work-up in the ED is negative for DVT, but he is noted to have an elevated BNP.  He was started on lasix and admitted by his primary care provider.  Oncology was consulted to help guide his hospital care.  Today he reports that he is feeling better.  He reports that his LE edema is improved.  He is not the best historian.  He reports that he thinks he could mobilize today and I will defer that to his attending physician.    I personally reviewed and went over laboratory results with the patient.  The results are noted within this dictation.  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  He denies any bleeding.  He denies any blood in his stool, black tarry stool, hemoptysis, hematuria, and gingival bleeding.  He denies any cough, sputum production, fevers, chills, night sweats, abdominal pain, diarrhea, constipation, and mental status changes.   PAST MEDICAL HISTORY:   Past Medical History  Diagnosis Date  . Coronary artery disease   . Hypertension   . High cholesterol   . Diabetes mellitus   . LBBB (left bundle branch block)   .  Paroxysmal atrial fibrillation   . Pneumonia 10-2012  . CHF (congestive heart failure)   . Renal disorder     kidney cancer  . Hepatitis many yrs ago    no current liver problems per pt. reportedly Hepatitis C  . Disc     6 discs loswer back, back brace prn  . Anemia   . Cancer 2010    right nephrectomy  . 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 Complication of anesthesia     years ago went into a coma  . Sleep apnea     cpap setting of 4, or uses oxygen 4 liters per [place]  . Atrial  fibrillation   . COPD (chronic obstructive pulmonary disease)     ALLERGIES: Allergies  Allergen Reactions  . Other Other (See Comments)    Anesthesia (gas) medication, Reaction: went into coma in the 1960's      MEDICATIONS: I have reviewed the patient's current medications.     PAST SURGICAL HISTORY Past Surgical History  Procedure Laterality Date  . Nephrectomy Right 2010  . Esophagogastroduodenoscopy Left 10/02/2012    Dr. Paulita Fujita: normal   . Colonoscopy Left 10/03/2012    Dr. Michail Sermon: poorly prepped colon, lipomatous ileocecal valve. Path revealed focal acute inflammation with question of NSAID-induced injury.   . Back surgery  1960's and 1970's    lower x 2  . Left ring finger traumatic amputation      jumped out of truck, finger caught  . Coronary angioplasty with stent placement  yrs ago    x 5 or 6--DES TO LAD AND RAMUS  . Radiology with anesthesia N/A 11/29/2013    Procedure: RADIOLOGY WITH ANESTHESIA;  Surgeon: Consuella Lose, MD;  Location: [place];  Service: Radiology;  Laterality: N/A;  . Posterior lumbar fusion 4 level N/A 11/30/2013    Procedure: THORACIC TEN LAMINECTOMY DECOMPRESSION WITH THORACIC EIGHT-THORACIC TWELVE INSTRUMENTATION;  Surgeon: Consuella Lose, MD;  Location: [place] [facility];  Service: Neurosurgery;  Laterality: N/A;    FAMILY HISTORY: Family History  Problem Relation Age of Onset  . Hyperlipidemia Mother   . Diabetes Mother   . Heart attack Father   . Colon cancer Neg Hx   . Crohn's disease Neg Hx   . Ulcerative colitis Neg Hx     SOCIAL HISTORY:  reports that he has been passively smoking.  He has never used smokeless tobacco. He reports that he does not drink alcohol or use illicit drugs.  PERFORMANCE STATUS: The patient's performance status is 3 - Symptomatic, >50% confined to bed  PHYSICAL EXAM: Most Recent Vital Signs: Blood pressure 110/64, pulse 70, temperature 98.5 F (36.9 C), temperature source Oral, resp. rate 18, height  _0  (1.702 m), weight 207 lb 7.3 oz (94.1 kg), SpO2 98.00%. General appearance: alert, cooperative, appears older than stated age, no distress and moderately obese Head: Normocephalic, without obvious abnormality, atraumatic Eyes: negative findings: lids and lashes normal and conjunctivae and sclerae normal Ears: not examined Throat: normal findings: lips normal without lesions, tongue midline and normal and oropharynx pink & moist without lesions or evidence of thrush Neck: no adenopathy and supple, symmetrical, trachea midline Lungs: clear to auscultation bilaterally Heart: regular rate and rhythm Abdomen: normal findings: bowel sounds normal, no masses palpable, no organomegaly and soft, non-tender Extremities: edema 1-2+ pitting bilaterally.  5+ strength in bilateral LE.  No petechiae Skin: Skin color, texture, turgor normal. No rashes or lesions Neurologic: Grossly normal  LABORATORY DATA:  Results for orders placed during the hospital encounter of 01/11/14 (from the past 48 hour(s))  CBC WITH DIFFERENTIAL     Status: Abnormal   Collection Time    01/11/14  2:47 PM      Result Value Ref Range   WBC 3.1 (*) 4.0 - 10.5 K/uL   RBC 4.74  4.22 - 5.81 MIL/uL   Hemoglobin 12.7 (*) 13.0 - 17.0 g/dL   HCT 37.6 (*) 39.0 - 52.0 %   MCV 79.3  78.0 - 100.0 fL   MCH 26.8  26.0 - 34.0 pg   MCHC 33.8  30.0 - 36.0 g/dL   RDW 17.4 (*) 11.5 - 15.5 %   Platelets 39 (*) 150 - 400 K/uL   Comment: SPECIMEN CHECKED FOR CLOTS   Neutrophils Relative % 85 (*) 43 - 77 %   Lymphocytes Relative 8 (*) 12 - 46 %   Monocytes Relative 7  3 - 12 %   Eosinophils Relative 0  0 - 5 %   Basophils Relative 0  0 - 1 %   Neutro Abs 2.7  1.7 - 7.7 K/uL   Lymphs Abs 0.2 (*) 0.7 - 4.0 K/uL   Monocytes Absolute 0.2  0.1 - 1.0 K/uL   Eosinophils Absolute 0.0  0.0 - 0.7 K/uL   Basophils Absolute 0.0  0.0 - 0.1 K/uL   RBC Morphology POLYCHROMASIA PRESENT     WBC Morphology INCREASED BANDS (>20% BANDS)     Comment:  DOHLE BODIES   Smear Review PLATELET COUNT CONFIRMED BY SMEAR     Comment: PLATELETS APPEAR DECREASED  COMPREHENSIVE METABOLIC PANEL     Status: Abnormal   Collection Time    01/11/14  2:47 PM      Result Value Ref Range   Sodium 130 (*) 137 - 147 mEq/L   Potassium 4.5  3.7 - 5.3 mEq/L   Chloride 90 (*) 96 - 112 mEq/L   CO2 29  19 - 32 mEq/L   Glucose, Bld 315 (*) 70 - 99 mg/dL   BUN 25 (*) 6 - 23 mg/dL   Creatinine, Ser 1.39 (*) 0.50 - 1.35 mg/dL   Calcium 8.1 (*) 8.4 - 10.5 mg/dL   Total Protein 5.5 (*) 6.0 - 8.3 g/dL   Albumin 2.2 (*) 3.5 - 5.2 g/dL   AST 67 (*) 0 - 37 U/L   ALT 78 (*) 0 - 53 U/L   Alkaline Phosphatase 72  39 - 117 U/L   Total Bilirubin 0.9  0.3 - 1.2 mg/dL   GFR calc non Af Amer 50 (*) >90 mL/min   GFR calc Af Amer 58 (*) >90 mL/min   Comment: (NOTE)     The eGFR has been calculated using the CKD EPI equation.     This calculation has not been validated in all clinical situations.     eGFR's persistently <90 mL/min signify possible Chronic Kidney     Disease.   Anion gap 11  5 - 15  PRO B NATRIURETIC PEPTIDE     Status: Abnormal   Collection Time    01/11/14  2:47 PM      Result Value Ref Range   Pro B Natriuretic peptide (BNP) 1255.0 (*) 0 - 125 pg/mL  TROPONIN I     Status: None   Collection Time    01/11/14  2:47 PM      Result Value Ref Range   Troponin I <0.30  <0.30 ng/mL   Comment:  Due to the release kinetics of cTnI,     a negative result within the first hours     of the onset of symptoms does not rule out     myocardial infarction with certainty.     If myocardial infarction is still suspected,     repeat the test at appropriate intervals.  URINALYSIS, ROUTINE W REFLEX MICROSCOPIC     Status: Abnormal   Collection Time    01/11/14  5:25 PM      Result Value Ref Range   Color, Urine AMBER (*) YELLOW   Comment: BIOCHEMICALS MAY BE AFFECTED BY COLOR   APPearance CLEAR  CLEAR   Specific Gravity, Urine 1.025  1.005 - 1.030    pH 5.5  5.0 - 8.0   Glucose, UA 500 (*) NEGATIVE mg/dL   Hgb urine dipstick LARGE (*) NEGATIVE   Bilirubin Urine SMALL (*) NEGATIVE   Ketones, ur NEGATIVE  NEGATIVE mg/dL   Protein, ur 100 (*) NEGATIVE mg/dL   Urobilinogen, UA 2.0 (*) 0.0 - 1.0 mg/dL   Nitrite NEGATIVE  NEGATIVE   Leukocytes, UA NEGATIVE  NEGATIVE  URINE MICROSCOPIC-ADD ON     Status: Abnormal   Collection Time    01/11/14  5:25 PM      Result Value Ref Range   Squamous Epithelial / LPF FEW (*) RARE   WBC, UA 3-6  <3 WBC/hpf   Bacteria, UA MANY (*) RARE   Casts GRANULAR CAST (*) NEGATIVE  CBC WITH DIFFERENTIAL     Status: Abnormal   Collection Time    01/11/14  8:25 PM      Result Value Ref Range   WBC 3.1 (*) 4.0 - 10.5 K/uL   RBC 4.98  4.22 - 5.81 MIL/uL   Hemoglobin 13.3  13.0 - 17.0 g/dL   HCT 39.3  39.0 - 52.0 %   MCV 78.9  78.0 - 100.0 fL   MCH 26.7  26.0 - 34.0 pg   MCHC 33.8  30.0 - 36.0 g/dL   RDW 17.5 (*) 11.5 - 15.5 %   Platelets 44 (*) 150 - 400 K/uL   Comment: SPECIMEN CHECKED FOR CLOTS     RESULT REPEATED AND VERIFIED   Neutrophils Relative % 82 (*) 43 - 77 %   Lymphocytes Relative 10 (*) 12 - 46 %   Monocytes Relative 7  3 - 12 %   Eosinophils Relative 1  0 - 5 %   Basophils Relative 0  0 - 1 %   Neutro Abs 2.6  1.7 - 7.7 K/uL   Lymphs Abs 0.3 (*) 0.7 - 4.0 K/uL   Monocytes Absolute 0.2  0.1 - 1.0 K/uL   Eosinophils Absolute 0.0  0.0 - 0.7 K/uL   Basophils Absolute 0.0  0.0 - 0.1 K/uL   RBC Morphology POLYCHROMASIA PRESENT     Smear Review PLATELETS APPEAR DECREASED    GLUCOSE, CAPILLARY     Status: Abnormal   Collection Time    01/11/14  9:13 PM      Result Value Ref Range   Glucose-Capillary 319 (*) 70 - 99 mg/dL   Comment 1 Notify RN     Comment 2 Documented in Chart    COMPREHENSIVE METABOLIC PANEL     Status: Abnormal   Collection Time    01/12/14  5:16 AM      Result Value Ref Range   Sodium 135 (*) 137 - 147 mEq/L   Potassium 4.3  3.7 -  5.3 mEq/L   Chloride 93 (*) 96 -  112 mEq/L   CO2 29  19 - 32 mEq/L   Glucose, Bld 338 (*) 70 - 99 mg/dL   BUN 24 (*) 6 - 23 mg/dL   Creatinine, Ser 1.41 (*) 0.50 - 1.35 mg/dL   Calcium 7.6 (*) 8.4 - 10.5 mg/dL   Total Protein 5.1 (*) 6.0 - 8.3 g/dL   Albumin 2.0 (*) 3.5 - 5.2 g/dL   AST 43 (*) 0 - 37 U/L   ALT 68 (*) 0 - 53 U/L   Alkaline Phosphatase 65  39 - 117 U/L   Total Bilirubin 0.7  0.3 - 1.2 mg/dL   GFR calc non Af Amer 49 (*) >90 mL/min   GFR calc Af Amer 57 (*) >90 mL/min   Comment: (NOTE)     The eGFR has been calculated using the CKD EPI equation.     This calculation has not been validated in all clinical situations.     eGFR's persistently <90 mL/min signify possible Chronic Kidney     Disease.   Anion gap 13  5 - 15  CBC     Status: Abnormal   Collection Time    01/12/14  5:16 AM      Result Value Ref Range   WBC 2.7 (*) 4.0 - 10.5 K/uL   RBC 4.31  4.22 - 5.81 MIL/uL   Hemoglobin 11.3 (*) 13.0 - 17.0 g/dL   HCT 34.2 (*) 39.0 - 52.0 %   MCV 79.4  78.0 - 100.0 fL   MCH 26.2  26.0 - 34.0 pg   MCHC 33.0  30.0 - 36.0 g/dL   RDW 17.4 (*) 11.5 - 15.5 %   Platelets 39 (*) 150 - 400 K/uL   Comment: RESULT REPEATED AND VERIFIED     SPECIMEN CHECKED FOR CLOTS     PLATELET COUNT CONFIRMED BY SMEAR  GLUCOSE, CAPILLARY     Status: Abnormal   Collection Time    01/12/14  7:40 AM      Result Value Ref Range   Glucose-Capillary 305 (*) 70 - 99 mg/dL   Comment 1 Documented in Chart     Comment 2 Notify RN    GLUCOSE, CAPILLARY     Status: Abnormal   Collection Time    01/12/14 12:41 PM      Result Value Ref Range   Glucose-Capillary 193 (*) 70 - 99 mg/dL   Comment 1 Documented in Chart     Comment 2 Notify RN    GLUCOSE, CAPILLARY     Status: Abnormal   Collection Time    01/12/14  4:38 PM      Result Value Ref Range   Glucose-Capillary 279 (*) 70 - 99 mg/dL   Comment 1 Documented in Chart     Comment 2 Notify RN    GLUCOSE, CAPILLARY     Status: Abnormal   Collection Time    01/12/14 10:03 PM       Result Value Ref Range   Glucose-Capillary 213 (*) 70 - 99 mg/dL   Comment 1 Documented in Chart     Comment 2 Notify RN    BASIC METABOLIC PANEL     Status: Abnormal   Collection Time    01/13/14  5:43 AM      Result Value Ref Range   Sodium 137  137 - 147 mEq/L   Potassium 4.0  3.7 - 5.3 mEq/L   Chloride 98  96 - 112 mEq/L   CO2 30  19 - 32 mEq/L   Glucose, Bld 316 (*) 70 - 99 mg/dL   BUN 23  6 - 23 mg/dL   Creatinine, Ser 1.10  0.50 - 1.35 mg/dL   Calcium 7.6 (*) 8.4 - 10.5 mg/dL   GFR calc non Af Amer 66 (*) >90 mL/min   GFR calc Af Amer 77 (*) >90 mL/min   Comment: (NOTE)     The eGFR has been calculated using the CKD EPI equation.     This calculation has not been validated in all clinical situations.     eGFR's persistently <90 mL/min signify possible Chronic Kidney     Disease.   Anion gap 9  5 - 15  GLUCOSE, CAPILLARY     Status: Abnormal   Collection Time    01/13/14  7:34 AM      Result Value Ref Range   Glucose-Capillary 272 (*) 70 - 99 mg/dL   Comment 1 Notify RN     Comment 2 Documented in Chart        RADIOGRAPHY: US Venous Img Lower Bilateral  01/11/2014   CLINICAL DATA:  Bilateral leg swelling.  EXAM: BILATERAL LOWER EXTREMITY VENOUS DOPPLER ULTRASOUND  TECHNIQUE: Gray-scale sonography with graded compression, as well as color Doppler and duplex ultrasound were performed to evaluate the lower extremity deep venous systems from the level of the common femoral vein and including the common femoral, femoral, profunda femoral, popliteal and calf veins including the posterior tibial, peroneal and gastrocnemius veins when visible. The superficial great saphenous vein was also interrogated. Spectral Doppler was utilized to evaluate flow at rest and with distal augmentation maneuvers in the common femoral, femoral and popliteal veins.  COMPARISON:  None.  FINDINGS: RIGHT LOWER EXTREMITY  Normal compressibility, augmentation and color Doppler flow in the right common  femoral vein, right femoral vein and right popliteal vein. Visualized right great saphenous vein is patent. Visualized right calf veins are patent.  LEFT LOWER EXTREMITY  Normal compressibility, augmentation and color Doppler flow in the left common femoral vein, left femoral vein and left popliteal vein. Visualized left great saphenous vein is patent. Visualized left calf veins are patent.  Other Findings: Soft tissue edema in the left lower calf and left ankle.  IMPRESSION: Negative for deep vein thrombosis in the lower extremities.   Electronically Signed   By: Markus Daft M.D.   On: 01/11/2014 16:49   Dg Chest Portable 1 View  01/11/2014   CLINICAL DATA:  Leg swelling.  Pain.  EXAM: PORTABLE CHEST - 1 VIEW  COMPARISON:  One-view chest 12/01/2013.  FINDINGS: The heart is enlarged. There is no significant edema or effusion suggest failure. The lung volumes are low. Thoracolumbar fusion is again noted.  IMPRESSION: 1. No acute cardiopulmonary disease or significant interval change. 2. Stable cardiomegaly without failure.   Electronically Signed   By: Lawrence Santiago M.D.   On: 01/11/2014 16:37       PATHOLOGY:    for DAREON, NUNZIATO ([id number]) Patient: PEARCE, LITTLEFIELD A Collected: 11/30/2013 Client: [place] Accession: [id number] Received: 11/30/2013 Consuella Lose, MD DOB: 10-Jul-1940 Age: 73 Gender: M Reported: 12/01/2013 [address] Patient Ph: [phone] MRN #: [id number] [place], [place] [address] Visit #: [id number] Chart #: Phone: Fax: CC: REPORT OF SURGICAL PATHOLOGY FINAL DIAGNOSIS Diagnosis Bone, biopsy, Thoracic ten - METASTATIC RENAL CELL CARCINOMA, CLEAR CELL TYPE, SEE COMMENT. Microscopic Comment The previous biopsy demonstrating features  of metastatic renal cell carcinoma is noted ([id number]). (CRR:gt, 12/01/13) Mali RUND DO Pathologist, Electronic Signature (Case signed 12/01/2013) Specimen Gross and Clinical Information Specimen(s) Obtained: Bone,  biopsy, Thoracic ten Specimen Clinical Information T10 metastasis (tl) Gross In formalin is a 1.0 x 0.6 x 0.3 cm aggregate of tan pink to red soft tissue, entirely submitted in one block. (SW:ds 11/30/13) Report signed out from the following location(s) Technical Component and Interpretation performed at [facility], [place], [place] [address]. CLIA #: [id number],    ASSESSMENT:  1. 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  Additionally, he is S/P cervical and thoracic spine lesions radiotherapy by Dr. Isidore Moos (30 Gy). 2. Pancytopenia.  Recommend 2 unit PRBC transfusion for Hgb less than 9 g/dL due to cardiac history.  Recommend 1 unit pheresed platelets for a platelet count of less than 10,000 and/or active bleeding.  No special requirements needed for blood products. 3. Myelophthisis 4. Poor historian 5. Deconditioning  Patient Active Problem List   Diagnosis Date Noted  . Secondary malignant neoplasm of bone and bone marrow 12/20/2013  . Weakness 12/16/2013  . OSA (obstructive sleep apnea) 11/30/2013  . Cancer of kidney 11/10/2013  . Dehydration 01/24/2013  . Atrial fibrillation 01/24/2013  . CKD (chronic kidney disease) stage 3, GFR 30-59 ml/min 01/24/2013  . Nausea with vomiting 01/24/2013  . Hyperglycemia 01/24/2013  . Abdominal pain, unspecified site 01/24/2013  . Anemia 10/03/2012  . Acute pulmonary edema 09/29/2012  . Acute respiratory failure secondary to pulmonary edema and community-acquired pneumonia 08/30/2012  . HCAP (healthcare-associated pneumonia) 08/30/2012  . COPD exacerbation 08/30/2012  . COPD (chronic obstructive pulmonary disease) 08/30/2012  . Atrial fibrillation with rapid ventricular response 08/21/2012  . CAD (coronary artery disease), native coronary artery 08/21/2012  . IDDM  (insulin dependent diabetes mellitus) 08/21/2012  . Hyperlipidemia LDL goal < 70 08/21/2012  . Hypertension associated with diabetes 08/21/2012  . Degenerative disc disease, lumbar 08/21/2012  . Illiterate 08/21/2012     PLAN:  1. I personally reviewed and went over laboratory results with the patient.  The results are noted within this dictation. 2. I personally reviewed and went over radiographic studies with the patient.  The results are noted within this dictation.   3. Labs today: Anemia panel, HIV antibody, CBC diff 4. Continue Dexamethasone 8 mg daily.  Will transition to PO and change to 4 mg BID in preparation for discharge. 5. No indication for L-spine MRI at this time given physical exam findings 6. Following discharge, patient is to follow-up with Dr. Alen Blew for consideration of systemic chemotherapy with TKI, suspect Votrient due to more tolerable side effect profile, and bone treatment with Xgeva as scheduled on 8/13. 7. Follow-up with Dr. Isidore Moos, Rad Onc, as scheduled on 8/14 8. Suspect discharge in the near future. Consider SNF. 9. Oncology will follow from the periphery while patient is in the hospital.  All questions were answered. The patient knows to call the clinic with any problems, questions or concerns. We can certainly see the patient much sooner if necessary.  Patient and plan discussed with Dr. Farrel Gobble and he is in agreement with the aforementioned.   KEFALAS,THOMAS 01/13/2014

## 2014-01-13 NOTE — Progress Notes (Signed)
Physical Therapy Treatment Patient Details Name: MINORU CHAP MRN: [id number] DOB: [birth year] Today's Date: 01/13/2014    History of Present Illness      PT Comments    Pt eager to participate with therapy session.  No reports of pain through session.  Supervision for bed mobility with cueing for handplacement to assist.  Sit to stand and gait training with RW supervision with cueing for handplacement.  Pt able to ambulate 22 feet with noted decreased stance phase with Lt LE. Cueing for appropriate loading to improve gait mechanics.  Pt left in chair with chair alarm set and call bell within reach.    Follow Up Recommendations        Equipment Recommendations       Recommendations for Other Services       Precautions / Restrictions Restrictions Weight Bearing Restrictions: No    Mobility  Bed Mobility Overal bed mobility: Modified Independent Bed Mobility: Supine to Sit     Supine to sit: Supervision     General bed mobility comments: Pt able to complete supine to sit with cueing for handplacement to assist, able to complete wtih supervision  Transfers Overall transfer level: Needs assistance Equipment used: Rolling walker (2 wheeled) Transfers: Sit to/from Stand Sit to Stand: Supervision         General transfer comment: VC for hand placement on bed during sit -> stand transfer.   Ambulation/Gait Ambulation/Gait assistance: Min guard Ambulation Distance (Feet): 22 Feet Assistive device: Rolling walker (2 wheeled) Gait Pattern/deviations: Decreased stance time - left;Step-through pattern;Decreased step length - right;Decreased step length - left   Gait velocity interpretation: Below normal speed for age/gender     Stairs            Wheelchair Mobility    Modified Rankin (Stroke Patients Only)       Balance                                    Cognition                            Exercises General Exercises -  Lower Extremity Ankle Circles/Pumps: AROM;15 reps;Supine (Dorsi/plantar flexion; Inversin/Eversion) Heel Slides: AROM;Both;5 reps;Supine    General Comments        Pertinent Vitals/Pain No reports of pain through session.      Home Living                      Prior Function            PT Goals (current goals can now be found in the care plan section) Progress towards PT goals: Progressing toward goals    Frequency       PT Plan Current plan remains appropriate    Co-evaluation             End of Session Equipment Utilized During Treatment: Gait belt Activity Tolerance: Patient tolerated treatment well Patient left: in chair;with call bell/phone within reach;with chair alarm set     Time: 9150-5697 PT Time Calculation (min): 20 min  Charges:  $Gait Training: 8-22 mins $Therapeutic Activity: 8-22 mins                    G Codes:      Aldona Lento 01/13/2014, 12:28 PM

## 2014-01-13 NOTE — Care Management Note (Signed)
    Page 1 of 1   01/13/2014     1:59:27 PM CARE MANAGEMENT NOTE 01/13/2014  Patient:  [name], [name]   Account Number:  [id number]  Date Initiated:  01/13/2014  Documentation initiated by:  Theophilus Kinds  Subjective/Objective Assessment:   Pt admitted from home with weakness. Pt lives alone and will return home at discharge. Pt has a neighbor Butch Penny who helps him with appts and medications. Pt is active with [facility] Pt and OT. Pt has cane, walker, and w/c.     Action/Plan:   Cm arranged resumption of [facility] and added RN to services. Orders faxed to [facility]. [facility] services to resume within 48 hours of discharge. No DME needs noted.   Anticipated DC Date:  01/13/2014   Anticipated DC Plan:  [place]  CM consult      [facility] Choice  Resumption Of Svcs/PTA Provider   Choice offered to / List presented to:  C-1 Patient        [place] arranged  HH-1 RN  [place]      [place] agency  [place]   Status of service:  Completed, signed off Medicare Important Message given?  NA - LOS <3 / Initial given by admissions (If response is "NO", the following Medicare IM given date fields will be blank) Date Medicare IM given:   Medicare IM given by:   Date Additional Medicare IM given:   Additional Medicare IM given by:    Discharge Disposition:  [place]  Per UR Regulation:    If discussed at Long Length of Stay Meetings, dates discussed:    Comments:  01/13/14 [place], RN BSN CM

## 2014-01-13 NOTE — Discharge Summary (Signed)
[id number] 

## 2014-01-13 NOTE — Progress Notes (Signed)
Inpatient Diabetes Program Recommendations  AACE/ADA: New Consensus Statement on Inpatient Glycemic Control (2013)  Target Ranges:  Prepandial:   less than 140 mg/dL      Peak postprandial:   less than 180 mg/dL (1-2 hours)      Critically ill patients:  140 - 180 mg/dL   Results for MITCHEAL, SWEETIN (MRN [id number]) as of 01/13/2014 09:01  Ref. Range 01/12/2014 07:40 01/12/2014 12:41 01/12/2014 16:38 01/12/2014 22:03 01/13/2014 07:34  Glucose-Capillary Latest Range: 70-99 mg/dL 305 (H) 193 (H) 279 (H) 213 (H) 272 (H)   Diabetes history: DM2  Outpatient Diabetes medications: Lantus 40 units QHS  Current orders for Inpatient glycemic control:Lantus 40 units QHS, Novolog 0-15 units AC, Novolog 0-5 units HS  Inpatient Diabetes Program Recommendations Insulin - Basal: Patient received a total of Lantus 60 units on 01/12/14 (20 units at 13:01 and 40 units at 21:59).  Please consider increasing Lantus to 50 units QHS. Correction (SSI): Please consider increasing Novolog correction to resistant scale.  Thanks, Barnie Alderman, RN, MSN, CCRN Diabetes Coordinator Inpatient Diabetes Program [phone] (Team Pager) [phone] (AP office) [phone] [facility] office)

## 2014-01-13 NOTE — Progress Notes (Signed)
Pt d/c home with friend/caregiver named Butch Penny @ aprox 2:50pm this shift. Pt assisted with gathering his belongings, getting dressed & assisted to w/c for transport out of the hospital. Pt confirmed understanding of d/c instructions. Iv removed from LEFT FA with catheter tip intact, no bleeding noted. Condom catheter removed.

## 2014-01-14 NOTE — Discharge Summary (Signed)
NAMEJERRAN, [name]                ACCOUNT NO.:  [id number]  MEDICAL RECORD NO.:  [id number]  LOCATION:                                 FACILITY:  PHYSICIAN:  Unk Lightning, MDDATE OF BIRTH:  07/19/1940  DATE OF ADMISSION:  01/11/2014 DATE OF DISCHARGE:  08/06/2015LH                              DISCHARGE SUMMARY   HOSPITAL COURSE:  The patient has history of insulin-dependent diabetes, status post CABG, history of paroxysmal AFib, currently in sinus rhythm with chronic left bundle branch block pattern of depolarization, degenerative disc disease, completely illiterate, chronic noncompliance, hypertension, hyperlipidemia with status post nephrectomy for renal cell carcinoma, 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  This seems to be functioning well.  He has already had initial x-ray therapy to the cervical spine mass, which showed an epidural involvement at present and he was discharged to a skilled nursing facility.  The patient signed out AMA.  He is alert and oriented and just does like old people and the food he states.  We will try to dissuade him from this, that he needs a higher level of care, but he wants to go home, so respecting his wishes, I guess some of our better judgements.  The patient will have home health nursing and physical therapy at home along with an RN.  He was seen in hospital due to the generalized bilateral lower extremity weakness.  There were no neurologic deficits mostly due to deconditioning from being in skilled nursing facility, and Oncology consult was obtained.  He has follow up by Radiation Oncology with Dr. Isidore Moos on January 21, 2014, which he will keep and follow up with Dr. Alen Blew for  consideration of systemic chemotherapy and bone treatment with possible drug called Delton See on January 20, 2014.  His new discharge medicines are dexamethasone 4 mg p.o. b.i.d. and this will be continued for at least a week and then follow up in the office and subsequently discontinued by myself or Oncology depending on clinical response.  His other discharge medicines include amiodarone 200 mg p.o. daily, Combivent Respimat 2 puffs q.12 hours, Decadron 4 mg p.o. b.i.d., diltiazem 360 p.o. daily, Lasix 40 p.o. daily, Norco 10/325 p.o. q.i.d. p.r.n., Lantus insulin 40 units subcu at bedtime, DuoNeb nebulizer solution 0.5-2.5 in 3 mL solution q.i.d. p.r.n., methadone 10 mg p.o. t.i.d. p.r.n., Lopressor 50 mg p.o. b.i.d., Nitro-Dur patch 0.2 mg for 24 hours daily, Protonix 40 mg p.o. daily, and Flomax 0.4 mg p.o. daily.  The patient is cautioned to follow up in my office along with generalized Oncology as well as Radiation Oncology, and this will all be written for him.  The patient is illiterate, but his male friend does read and follow instructions rather well.  He will likewise have home health nurse.     Unk Lightning, MD     RMD/MEDQ  D:  01/13/2014  T:  01/13/2014  Job:  [id number]

## 2014-01-18 ENCOUNTER — Telehealth: Payer: Self-pay | Admitting: Oncology

## 2014-01-18 NOTE — Telephone Encounter (Signed)
Pt's wife cld to r/s due to her work schedule and she is pt's transportation...KJ

## 2014-01-19 ENCOUNTER — Telehealth: Payer: Self-pay | Admitting: *Deleted

## 2014-01-19 ENCOUNTER — Emergency Department (HOSPITAL_COMMUNITY)
Admission: EM | Admit: 2014-01-19 | Discharge: 2014-01-19 | Disposition: A | Discharge status: Home / Self Care | Payer: Medicare Other | Attending: Emergency Medicine | Admitting: Emergency Medicine

## 2014-01-19 ENCOUNTER — Ambulatory Visit: Payer: Medicare Other | Admitting: Oncology

## 2014-01-19 ENCOUNTER — Emergency Department (HOSPITAL_COMMUNITY): Payer: Medicare Other

## 2014-01-19 ENCOUNTER — Encounter (HOSPITAL_COMMUNITY): Payer: Self-pay | Admitting: Emergency Medicine

## 2014-01-19 ENCOUNTER — Other Ambulatory Visit: Payer: Medicare Other

## 2014-01-19 DIAGNOSIS — Z79899 Other long term (current) drug therapy: Secondary | ICD-10-CM | POA: Diagnosis not present

## 2014-01-19 DIAGNOSIS — Z951 Presence of aortocoronary bypass graft: Secondary | ICD-10-CM | POA: Insufficient documentation

## 2014-01-19 DIAGNOSIS — R609 Edema, unspecified: Secondary | ICD-10-CM | POA: Diagnosis not present

## 2014-01-19 DIAGNOSIS — Z9981 Dependence on supplemental oxygen: Secondary | ICD-10-CM | POA: Insufficient documentation

## 2014-01-19 DIAGNOSIS — Z85830 Personal history of malignant neoplasm of bone: Secondary | ICD-10-CM | POA: Insufficient documentation

## 2014-01-19 DIAGNOSIS — Z85528 Personal history of other malignant neoplasm of kidney: Secondary | ICD-10-CM | POA: Diagnosis not present

## 2014-01-19 DIAGNOSIS — R0989 Other specified symptoms and signs involving the circulatory and respiratory systems: Secondary | ICD-10-CM | POA: Insufficient documentation

## 2014-01-19 DIAGNOSIS — Z8701 Personal history of pneumonia (recurrent): Secondary | ICD-10-CM | POA: Insufficient documentation

## 2014-01-19 DIAGNOSIS — M7989 Other specified soft tissue disorders: Secondary | ICD-10-CM | POA: Diagnosis present

## 2014-01-19 DIAGNOSIS — L89109 Pressure ulcer of unspecified part of back, unspecified stage: Secondary | ICD-10-CM | POA: Diagnosis not present

## 2014-01-19 DIAGNOSIS — G4730 Sleep apnea, unspecified: Secondary | ICD-10-CM | POA: Insufficient documentation

## 2014-01-19 DIAGNOSIS — I2510 Atherosclerotic heart disease of native coronary artery without angina pectoris: Secondary | ICD-10-CM | POA: Insufficient documentation

## 2014-01-19 DIAGNOSIS — Z794 Long term (current) use of insulin: Secondary | ICD-10-CM | POA: Insufficient documentation

## 2014-01-19 DIAGNOSIS — IMO0002 Reserved for concepts with insufficient information to code with codable children: Secondary | ICD-10-CM | POA: Insufficient documentation

## 2014-01-19 DIAGNOSIS — I10 Essential (primary) hypertension: Secondary | ICD-10-CM | POA: Insufficient documentation

## 2014-01-19 DIAGNOSIS — E119 Type 2 diabetes mellitus without complications: Secondary | ICD-10-CM | POA: Diagnosis not present

## 2014-01-19 DIAGNOSIS — J449 Chronic obstructive pulmonary disease, unspecified: Secondary | ICD-10-CM | POA: Insufficient documentation

## 2014-01-19 DIAGNOSIS — Z9861 Coronary angioplasty status: Secondary | ICD-10-CM | POA: Insufficient documentation

## 2014-01-19 DIAGNOSIS — I509 Heart failure, unspecified: Secondary | ICD-10-CM | POA: Diagnosis not present

## 2014-01-19 DIAGNOSIS — J4489 Other specified chronic obstructive pulmonary disease: Secondary | ICD-10-CM | POA: Insufficient documentation

## 2014-01-19 LAB — URINALYSIS, ROUTINE W REFLEX MICROSCOPIC
Bilirubin Urine: NEGATIVE
Glucose, UA: NEGATIVE mg/dL
HGB URINE DIPSTICK: NEGATIVE
KETONES UR: NEGATIVE mg/dL
Leukocytes, UA: NEGATIVE
NITRITE: NEGATIVE
PROTEIN: 30 mg/dL — AB
Specific Gravity, Urine: 1.014 (ref 1.005–1.030)
pH: 7 (ref 5.0–8.0)

## 2014-01-19 LAB — COMPREHENSIVE METABOLIC PANEL
ALT: 64 U/L — AB (ref 0–53)
ANION GAP: 9 (ref 5–15)
AST: 20 U/L (ref 0–37)
Albumin: 2.1 g/dL — ABNORMAL LOW (ref 3.5–5.2)
Alkaline Phosphatase: 78 U/L (ref 39–117)
BUN: 15 mg/dL (ref 6–23)
CALCIUM: 8.2 mg/dL — AB (ref 8.4–10.5)
CO2: 34 meq/L — AB (ref 19–32)
CREATININE: 1.08 mg/dL (ref 0.50–1.35)
Chloride: 90 mEq/L — ABNORMAL LOW (ref 96–112)
GFR, EST AFRICAN AMERICAN: 78 mL/min — AB (ref >90)
GFR, EST NON AFRICAN AMERICAN: 68 mL/min — AB (ref >90)
GLUCOSE: 246 mg/dL — AB (ref 70–99)
Potassium: 3.4 mEq/L — ABNORMAL LOW (ref 3.7–5.3)
SODIUM: 133 meq/L — AB (ref 137–147)
TOTAL PROTEIN: 5.5 g/dL — AB (ref 6.0–8.3)
Total Bilirubin: 1.2 mg/dL (ref 0.3–1.2)

## 2014-01-19 LAB — URINE MICROSCOPIC-ADD ON

## 2014-01-19 LAB — CBC WITH DIFFERENTIAL/PLATELET
Basophils Absolute: 0 10*3/uL (ref 0.0–0.1)
Basophils Relative: 0 % (ref 0–1)
EOS PCT: 0 % (ref 0–5)
Eosinophils Absolute: 0 10*3/uL (ref 0.0–0.7)
HEMATOCRIT: 33.7 % — AB (ref 39.0–52.0)
Hemoglobin: 11.3 g/dL — ABNORMAL LOW (ref 13.0–17.0)
LYMPHS ABS: 0.5 10*3/uL — AB (ref 0.7–4.0)
Lymphocytes Relative: 15 % (ref 12–46)
MCH: 26.2 pg (ref 26.0–34.0)
MCHC: 33.5 g/dL (ref 30.0–36.0)
MCV: 78 fL (ref 78.0–100.0)
MONO ABS: 0.2 10*3/uL (ref 0.1–1.0)
Monocytes Relative: 7 % (ref 3–12)
NEUTROS ABS: 2.4 10*3/uL (ref 1.7–7.7)
Neutrophils Relative %: 78 % — ABNORMAL HIGH (ref 43–77)
Platelets: 114 10*3/uL — ABNORMAL LOW (ref 150–400)
RBC: 4.32 MIL/uL (ref 4.22–5.81)
RDW: 17.8 % — AB (ref 11.5–15.5)
WBC: 3.1 10*3/uL — ABNORMAL LOW (ref 4.0–10.5)

## 2014-01-19 LAB — I-STAT TROPONIN, ED: Troponin i, poc: 0.05 ng/mL (ref 0.00–0.08)

## 2014-01-19 LAB — PRO B NATRIURETIC PEPTIDE: PRO B NATRI PEPTIDE: 746.1 pg/mL — AB (ref 0–125)

## 2014-01-19 MED ORDER — FUROSEMIDE 10 MG/ML IJ SOLN
80.0000 mg | Freq: Once | INTRAMUSCULAR | Status: AC
  Administered 2014-01-19: 80 mg via INTRAVENOUS
  Filled 2014-01-19: qty 8

## 2014-01-19 NOTE — Discharge Instructions (Signed)
Please continue with your fluid pill as previously prescribed.  Follow up with your doctor closely for further care.  Continue with your therapy as previously scheduled.    Peripheral Edema You have swelling in your legs (peripheral edema). This swelling is due to excess accumulation of salt and water in your body. Edema may be a sign of heart, kidney or liver disease, or a side effect of a medication. It may also be due to problems in the leg veins. Elevating your legs and using special support stockings may be very helpful, if the cause of the swelling is due to poor venous circulation. Avoid long periods of standing, whatever the cause. Treatment of edema depends on identifying the cause. Chips, pretzels, pickles and other salty foods should be avoided. Restricting salt in your diet is almost always needed. Water pills (diuretics) are often used to remove the excess salt and water from your body via urine. These medicines prevent the kidney from reabsorbing sodium. This increases urine flow. Diuretic treatment may also result in lowering of potassium levels in your body. Potassium supplements may be needed if you have to use diuretics daily. Daily weights can help you keep track of your progress in clearing your edema. You should call your caregiver for follow up care as recommended. SEEK IMMEDIATE MEDICAL CARE IF:   You have increased swelling, pain, redness, or heat in your legs.  You develop shortness of breath, especially when lying down.  You develop chest or abdominal pain, weakness, or fainting.  You have a fever. Document Released: 07/04/2004 Document Revised: 08/19/2011 Document Reviewed: 06/14/2009 [facility] Patient Information 2015 [place], [place]. This information is not intended to replace advice given to you by your health care provider. Make sure you discuss any questions you have with your health care provider.

## 2014-01-19 NOTE — ED Provider Notes (Signed)
CSN: [id number]     Arrival date & time 01/19/14  1441 History   First MD Initiated Contact with Patient 01/19/14 1549     Chief Complaint  Patient presents with  . Foot Swelling     (Consider location/radiation/quality/duration/timing/severity/associated sxs/prior Treatment) HPI  73 year old male with hx of IDDM, s/p CABG, hx paroxysmal afib, HTN, HLD, s/p RCC with mets to cervical and thoracic spine presents for evaluation of bilateral leg swelling. Hx obtain through pt and through friend who is at bedside. Of note, pt was admitted to hospital on 01/13/2014 for peripheral edema and generalized weakness.  He left AMA after a day.  Pt sts since he has been at home he has received outpt nursing care however continue to endorsed generalized weakness, increase fluid gain to both legs, which is painful and makes it difficult to ambulate.  He uses the wheel chair on occasion.  He can only ambulate with assists from PT.  He lives at home by himself.  Report that his doctor was supposed to call in a fluid pill but did not call until yesterday.  He only had one dose of Lasix today.  Report having intermittent lower back pain, sharp, radiate across lower back, worsening with movement.  This pain is related to his cancer, which he takes pain medication for.  Denies fever, chills, cp, sob, productive cough, abd pain, dysuria.  Pt request for admission help reduce his peripheral edema until he can walk again.        Past Medical History  Diagnosis Date  . Coronary artery disease   . Hypertension   . High cholesterol   . Diabetes mellitus   . LBBB (left bundle branch block)   . Paroxysmal atrial fibrillation   . Pneumonia 10-2012  . CHF (congestive heart failure)   . Renal disorder     kidney cancer  . Hepatitis many yrs ago    no current liver problems per pt. reportedly Hepatitis C  . Disc     6 discs loswer back, back brace prn  . Anemia   . Cancer 2010    right nephrectomy  . Bone cancer  10/22/13    T10,left C4  . Complication of anesthesia     years ago went into a coma  . Sleep apnea     cpap setting of 4, or uses oxygen 4 liters per [place]  . Atrial fibrillation   . COPD (chronic obstructive pulmonary disease)    Past Surgical History  Procedure Laterality Date  . Nephrectomy Right 2010  . Esophagogastroduodenoscopy Left 10/02/2012    Dr. Paulita Fujita: normal   . Colonoscopy Left 10/03/2012    Dr. Michail Sermon: poorly prepped colon, lipomatous ileocecal valve. Path revealed focal acute inflammation with question of NSAID-induced injury.   . Back surgery  1960's and 1970's    lower x 2  . Left ring finger traumatic amputation      jumped out of truck, finger caught  . Coronary angioplasty with stent placement  yrs ago    x 5 or 6--DES TO LAD AND RAMUS  . Radiology with anesthesia N/A 11/29/2013    Procedure: RADIOLOGY WITH ANESTHESIA;  Surgeon: Consuella Lose, MD;  Location: [place];  Service: Radiology;  Laterality: N/A;  . Posterior lumbar fusion 4 level N/A 11/30/2013    Procedure: THORACIC TEN LAMINECTOMY DECOMPRESSION WITH THORACIC EIGHT-THORACIC TWELVE INSTRUMENTATION;  Surgeon: Consuella Lose, MD;  Location: [facility];  Service: Neurosurgery;  Laterality: N/A;  Family History  Problem Relation Age of Onset  . Hyperlipidemia Mother   . Diabetes Mother   . Heart attack Father   . Colon cancer Neg Hx   . Crohn's disease Neg Hx   . Ulcerative colitis Neg Hx    History  Substance Use Topics  . Smoking status: Passive Smoke Exposure - Never Smoker  . Smokeless tobacco: Never Used  . Alcohol Use: No     Comment: quit ETOH 25-30 years ago    Review of Systems  All other systems reviewed and are negative.     Allergies  Other  Home Medications   Prior to Admission medications   Medication Sig Start Date End Date Taking? Authorizing Provider  amiodarone (PACERONE) 200 MG tablet Take 200 mg by mouth daily. For a-fib    Historical Provider, MD  COMBIVENT  RESPIMAT 20-100 MCG/ACT AERS respimat Inhale 1 puff into the lungs every 6 (six) hours as needed for shortness of breath.  08/09/13   Historical Provider, MD  dexamethasone (DECADRON) 4 MG tablet Take 1 tablet (4 mg total) by mouth 2 (two) times daily. Taper to 4mg  daily in the first week of August. Check glucose as this medication can elevate it. 12/28/13   Eppie Gibson, MD  diltiazem [facility]) 360 MG 24 hr capsule Take 360 mg by mouth daily. 10/04/13   Historical Provider, MD  furosemide (LASIX) 40 MG tablet Take 40 mg by mouth daily.    Historical Provider, MD  HYDROcodone-acetaminophen (NORCO) 10-325 MG per tablet Take 1 tablet by mouth every 6 (six) hours as needed (pain). 12/20/13   Theodis Blaze, MD  Insulin Glargine (LANTUS) 100 UNIT/ML Solostar Pen Inject 40 Units into the skin at bedtime. 01/27/13   Maricela Curet, MD  ipratropium-albuterol (DUONEB) 0.5-2.5 (3) MG/3ML SOLN Take 3 mLs by nebulization every 4 (four) hours as needed. 12/20/13   Theodis Blaze, MD  methadone (DOLOPHINE) 10 MG tablet Take 1 tablet (10 mg total) by mouth at bedtime as needed for moderate pain. 12/20/13   Theodis Blaze, MD  metoprolol (LOPRESSOR) 50 MG tablet Take 50 mg by mouth 2 (two) times daily.     Historical Provider, MD  nitroGLYCERIN (NITRODUR - DOSED IN MG/24 HR) 0.2 mg/hr patch Place 0.2 mg onto the skin daily. 08/16/13   Jettie Booze, MD  pantoprazole (PROTONIX) 40 MG tablet Take 40 mg by mouth daily.    Historical Provider, MD  tamsulosin (FLOMAX) 0.4 MG CAPS capsule Take 0.4 mg by mouth daily.    Historical Provider, MD   BP 113/59  Pulse 99  Temp(Src) 97.9 F (36.6 C) (Oral)  Resp 16  SpO2 94% Physical Exam  Constitutional: He is oriented to person, place, and time. He appears well-developed and well-nourished. No distress.  HENT:  Head: Atraumatic.  Mouth/Throat: Oropharynx is clear and moist.  Eyes: Conjunctivae are normal.  Neck: Normal range of motion. Neck supple.  Cardiovascular:  Normal rate, regular rhythm and intact distal pulses.   Pulmonary/Chest: Effort normal. He has rales (bilateral lower lung rales).  Abdominal: Soft. There is no tenderness.  Genitourinary:  Pressure ulcer to sacral region  Musculoskeletal: He exhibits edema (2+ pitting edema to bilateral lower extremities).  4/5 strength to all 4 extremities, no focal weakness  Neurological: He is alert and oriented to person, place, and time. GCS eye subscore is 4. GCS verbal subscore is 5. GCS motor subscore is 6.  Skin: No rash noted.  Psychiatric: He  has a normal mood and affect.    ED Course  Procedures (including critical care time)  4:22 PM Pt here with generalized weakness, and peripheral edema.  This is an acute on chronic problem.  He has a complex medical hx including metastatic RCC.  He is unable to ambulate 2/2 generalized weakness and moderate pedal edema.    7:12 PM Pt's labs are improving as compared to prior.  Pt just started to take his fluid pill since yesterday, and will benefit from continuing with his meds.  He has home health care, PT, and has PCP that he can follow up.  He does not have any acute emergent condition at this time.  Care discussed with Dr. Tawnya Crook who evaluated pt and felt pt stable for discharge.  Pt agrees with plan.  Standard return precaution discussed.   Labs Review Labs Reviewed  CBC WITH DIFFERENTIAL - Abnormal; Notable for the following:    WBC 3.1 (*)    Hemoglobin 11.3 (*)    HCT 33.7 (*)    RDW 17.8 (*)    Platelets 114 (*)    Neutrophils Relative % 78 (*)    Lymphs Abs 0.5 (*)    All other components within normal limits  COMPREHENSIVE METABOLIC PANEL - Abnormal; Notable for the following:    Sodium 133 (*)    Potassium 3.4 (*)    Chloride 90 (*)    CO2 34 (*)    Glucose, Bld 246 (*)    Calcium 8.2 (*)    Total Protein 5.5 (*)    Albumin 2.1 (*)    ALT 64 (*)    GFR calc non Af Amer 68 (*)    GFR calc Af Amer 78 (*)    All other components  within normal limits  PRO B NATRIURETIC PEPTIDE - Abnormal; Notable for the following:    Pro B Natriuretic peptide (BNP) 746.1 (*)    All other components within normal limits  URINALYSIS, ROUTINE W REFLEX MICROSCOPIC - Abnormal; Notable for the following:    Color, Urine AMBER (*)    Protein, ur 30 (*)    Urobilinogen, UA >8.0 (*)    All other components within normal limits  URINE MICROSCOPIC-ADD ON  Randolm Idol, ED    Imaging Review Dg Chest 2 View  01/19/2014   CLINICAL DATA:  Assess for pleural effusion, vascular congestion. Bilateral feet swelling and weakness.  EXAM: CHEST  2 VIEW  COMPARISON:  01/11/2014.  FINDINGS: Trachea is midline. Heart is enlarged. Descending thoracic aorta a is uncoiled. Linear subsegmental atelectasis at the lung bases. No pulmonary edema. Small left pleural effusion.  Postoperative changes are seen in the thoracolumbar spine. Embolized methylmethacrylate is seen in association with a lower thoracic vertebral body, as before.  IMPRESSION: 1. No definite pulmonary edema. 2. Tiny left pleural effusion.   Electronically Signed   By: Lorin Picket M.D.   On: 01/19/2014 16:58     EKG Interpretation None      MDM   Final diagnoses:  Peripheral edema    BP 113/59  Pulse 99  Temp(Src) 97.9 F (36.6 C) (Oral)  Resp 16  SpO2 94%  I have reviewed nursing notes and vital signs. I personally reviewed the imaging tests through PACS system  I reviewed available ER/hospitalization records thought the EMR     Domenic Moras, [place] 01/19/14 1913

## 2014-01-19 NOTE — Telephone Encounter (Signed)
Mr Pile' significant other, [name] called with concerns regarding the edema Of [name] feet and legs.  He was recently discharged from [facility] with Lasix, but she reports the selling is getting worse and that his left foot looks like"it is about to burst. She wants to transport him to the [facility] ED from [place] for care because he is unable to stand due to the swelling.  Explained to her that It may be very costly to transport [name] to [place] and that she needs to call the ambulance service to, first of all, to determine if they will transport to [place], and secondly, to find out what the cost will be.  She agreed to call and will notify us if he is transported to [place].  Explained to her that if he is admitted he will be under the Care of a hospitalist since our physicians do not admit patients and she stated understanding.  She is extremely worried at this point.  Asked if [name] was having any respiratory distress at this time and she stated no.

## 2014-01-19 NOTE — ED Notes (Signed)
Pt c/o bilateral feet swelling, pt isn't ambulatory due to severity of feet. Has raw area on lower back due to friction of couch of wheelchair.

## 2014-01-20 ENCOUNTER — Ambulatory Visit: Payer: Medicare Other | Admitting: Oncology

## 2014-01-20 ENCOUNTER — Other Ambulatory Visit: Payer: Medicare Other

## 2014-01-20 ENCOUNTER — Telehealth: Payer: Self-pay | Admitting: Oncology

## 2014-01-20 NOTE — Telephone Encounter (Signed)
per message from desk nurse returned call to pt's wife [phone]) re r/s appt. lmonvm for pt/wife to call office letting us know when pt can come in for appt.

## 2014-01-20 NOTE — ED Provider Notes (Signed)
Medical screening examination/treatment/procedure(s) were performed by non-physician practitioner and as supervising physician I was immediately available for consultation/collaboration.   Ernestina Patches, MD 01/20/14 [phone]

## 2014-01-21 ENCOUNTER — Telehealth: Payer: Self-pay | Admitting: Oncology

## 2014-01-21 ENCOUNTER — Telehealth: Payer: Self-pay | Admitting: *Deleted

## 2014-01-21 ENCOUNTER — Ambulatory Visit: Admission: RE | Admit: 2014-01-21 | Payer: Medicare Other | Source: Ambulatory Visit | Admitting: Radiation Oncology

## 2014-01-21 NOTE — Telephone Encounter (Signed)
[name] left the hospital AMA on 01/19/14.  He states he is able to get up to a wheelchair on his own, but is not capable of walking.  He also reports that started his "fluid pill" on yesterday and he feels "a little better now."  He has a home health nurse and physical therapist for in home care.  He was rescheduled to see Dr. Isidore Moos on 02/02/14.  He now has, what sounds like, a skin tear on his buttock and his home health nurse cleansed the area and applied a dressing.

## 2014-01-21 NOTE — Telephone Encounter (Signed)
Pt's wife cld bk to r/s, apt confirmed.Marland Kitchen..KJ

## 2014-02-01 ENCOUNTER — Encounter: Payer: Self-pay | Admitting: Radiation Oncology

## 2014-02-02 ENCOUNTER — Ambulatory Visit
Admission: RE | Admit: 2014-02-02 | Discharge: 2014-02-02 | Disposition: A | Discharge status: Home / Self Care | Payer: Medicare Other | Source: Ambulatory Visit | Attending: Radiation Oncology | Admitting: Radiation Oncology

## 2014-02-02 ENCOUNTER — Ambulatory Visit (HOSPITAL_BASED_OUTPATIENT_CLINIC_OR_DEPARTMENT_OTHER): Payer: Medicare Other | Admitting: Oncology

## 2014-02-02 ENCOUNTER — Ambulatory Visit: Payer: Medicare Other | Admitting: Radiation Oncology

## 2014-02-02 ENCOUNTER — Other Ambulatory Visit (HOSPITAL_BASED_OUTPATIENT_CLINIC_OR_DEPARTMENT_OTHER): Payer: Medicare Other

## 2014-02-02 ENCOUNTER — Telehealth: Payer: Self-pay | Admitting: Oncology

## 2014-02-02 ENCOUNTER — Encounter: Payer: Self-pay | Admitting: *Deleted

## 2014-02-02 ENCOUNTER — Encounter: Payer: Self-pay | Admitting: Oncology

## 2014-02-02 ENCOUNTER — Encounter: Payer: Self-pay | Admitting: Radiation Oncology

## 2014-02-02 VITALS — BP 95/53 | HR 70 | Temp 97.6°F | Resp 16 | Ht 67.0 in | Wt 199.0 lb

## 2014-02-02 VITALS — BP 96/53 | HR 70 | Temp 98.3°F | Ht 67.0 in | Wt 199.0 lb

## 2014-02-02 DIAGNOSIS — C7952 Secondary malignant neoplasm of bone marrow: Secondary | ICD-10-CM

## 2014-02-02 DIAGNOSIS — C649 Malignant neoplasm of unspecified kidney, except renal pelvis: Secondary | ICD-10-CM

## 2014-02-02 DIAGNOSIS — C7951 Secondary malignant neoplasm of bone: Secondary | ICD-10-CM

## 2014-02-02 HISTORY — DX: Personal history of irradiation: Z92.3

## 2014-02-02 LAB — CBC WITH DIFFERENTIAL/PLATELET
BASO%: 0.4 % (ref 0.0–2.0)
BASOS ABS: 0 10*3/uL (ref 0.0–0.1)
EOS ABS: 0 10*3/uL (ref 0.0–0.5)
EOS%: 0 % (ref 0.0–7.0)
HEMATOCRIT: 32.7 % — AB (ref 38.4–49.9)
HEMOGLOBIN: 10.7 g/dL — AB (ref 13.0–17.1)
LYMPH%: 3.6 % — ABNORMAL LOW (ref 14.0–49.0)
MCH: 26.1 pg — AB (ref 27.2–33.4)
MCHC: 32.6 g/dL (ref 32.0–36.0)
MCV: 80.1 fL (ref 79.3–98.0)
MONO#: 0.5 10*3/uL (ref 0.1–0.9)
MONO%: 4.8 % (ref 0.0–14.0)
NEUT#: 8.9 10*3/uL — ABNORMAL HIGH (ref 1.5–6.5)
NEUT%: 91.2 % — AB (ref 39.0–75.0)
Platelets: 189 10*3/uL (ref 140–400)
RBC: 4.08 10*6/uL — ABNORMAL LOW (ref 4.20–5.82)
RDW: 20.5 % — ABNORMAL HIGH (ref 11.0–14.6)
WBC: 9.7 10*3/uL (ref 4.0–10.3)
lymph#: 0.4 10*3/uL — ABNORMAL LOW (ref 0.9–3.3)

## 2014-02-02 LAB — COMPREHENSIVE METABOLIC PANEL (CC13)
ALBUMIN: 2.1 g/dL — AB (ref 3.5–5.0)
ALK PHOS: 77 U/L (ref 40–150)
ALT: 55 U/L (ref 0–55)
AST: 14 U/L (ref 5–34)
Anion Gap: 11 mEq/L (ref 3–11)
BUN: 21.6 mg/dL (ref 7.0–26.0)
CALCIUM: 8.5 mg/dL (ref 8.4–10.4)
CHLORIDE: 96 meq/L — AB (ref 98–109)
CO2: 31 mEq/L — ABNORMAL HIGH (ref 22–29)
CREATININE: 1.4 mg/dL — AB (ref 0.7–1.3)
Glucose: 245 mg/dl — ABNORMAL HIGH (ref 70–140)
Potassium: 3.9 mEq/L (ref 3.5–5.1)
Sodium: 138 mEq/L (ref 136–145)
Total Bilirubin: 0.55 mg/dL (ref 0.20–1.20)
Total Protein: 5.7 g/dL — ABNORMAL LOW (ref 6.4–8.3)

## 2014-02-02 MED ORDER — HYDROCODONE-ACETAMINOPHEN 10-325 MG PO TABS: 1.0000 | ORAL_TABLET | Freq: Four times a day (QID) | ORAL | Status: DC | PRN

## 2014-02-02 NOTE — Progress Notes (Signed)
[name] here today for reassessment s/p radiation therapy to his C-spine, and T-spine.  He is traveling by wheelchair today. He denies any pain in the prior treatment fields  It is difficult for him to stand and ambulate to the point that he had to sit on the steps at home and "bump" down the steps then he crawled to the car.  He was weighed in the wheelchair by medical oncology today prior to arrival in radiation oncology.  His closest family member, who is accompanying him today, lives 66 miles away. She is the only caregiver.  Multiple areas of reddened broken skin on his arms and hands.

## 2014-02-02 NOTE — Telephone Encounter (Signed)
Pt confirmed ov per 08/26 POF, gave pt AVS....KJ

## 2014-02-02 NOTE — Progress Notes (Signed)
Hematology and Oncology Follow Up Visit  [name] [id number] 10-21-1940 73 y.o. 02/02/2014 2:32 PM [name] Jerilynn Mages, MDDondiego, Ralene Bathe, MD   Principle Diagnosis: 73 year old gentleman with renal cell carcinoma diagnosed in 2009. He is status post right nephrectomy and now has metastatic disease to the bone.   Prior Therapy: Status post right nephrectomy done at [facility] indicate a renal cell carcinoma grade 2 done on 06/19/2007. 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 He is status post radiation therapy to the cervical spine between C2 and C4 for a total of 30 gray in 10 fractions. He also completed 30 grays in 10 fractions to T9 and T11 concluded on 12/29/2013.  Current therapy: Under evaluation for systemic therapy.  Interim History:  Mr. [name] presents today for a followup visit. Since last visit, he completed radiation therapy but had multiple hospitalizations since. Most recently, he was hospitalized at [facility]  the lower extremity edema which has since improved. Despite his hospitalizations, he continued to be in poor health without any dramatic improvement. His mobility he still limited in physical therapy. His pain is improved after radiation therapy. His appetite has been reasonable but his performance status is very limited. He is not reporting any headaches or blurry vision. Does not report any syncope or falls. He does not report any back pain and feels that soreness from the incision is improving. He does not report any neurological weakness in his lower extremities either. He did not report any frequency urgency or hesitancy. Does not report any nausea or vomiting or abdominal pain. His appetite has been reasonable and able to swallow without any difficulties. He is able to ambulate short distances without any falls. He has not reported any palpitation or  orthopnea. Had not reported any leg edema or wheezing. Rest of his review of systems unremarkable.  Medications: I have reviewed the patient's current medications.  Current Outpatient Prescriptions  Medication Sig Dispense Refill  . amiodarone (PACERONE) 200 MG tablet Take 200 mg by mouth every morning. For a-fib      . COMBIVENT RESPIMAT 20-100 MCG/ACT AERS respimat Inhale 1 puff into the lungs every 6 (six) hours as needed for shortness of breath.       . dexamethasone (DECADRON) 4 MG tablet Take 4 mg by mouth 2 (two) times daily with a meal.      . diltiazem (TIAZAC) 360 MG 24 hr capsule Take 360 mg by mouth every morning.       . furosemide (LASIX) 40 MG tablet Take 40 mg by mouth every morning.       Marland Kitchen HYDROcodone-acetaminophen (NORCO) 10-325 MG per tablet Take 1 tablet by mouth every 6 (six) hours as needed (pain).  65 tablet  0  . insulin aspart (NOVOLOG FLEXPEN) 100 UNIT/ML FlexPen Inject 5 Units into the skin 3 (three) times daily before meals. He takes for CBG's > 150.      Marland Kitchen Insulin Glargine (LANTUS) 100 UNIT/ML Solostar Pen Inject 50 Units into the skin at bedtime.       Marland Kitchen ipratropium-albuterol (DUONEB) 0.5-2.5 (3) MG/3ML SOLN Take 3 mLs by nebulization every 4 (four) hours as needed (For shortness of breath.).      Marland Kitchen methadone (DOLOPHINE) 10 MG tablet Take 10 mg by mouth every 12 (twelve) hours as needed (For pain.).       Marland Kitchen metoprolol (LOPRESSOR) 50 MG  tablet Take 50 mg by mouth 2 (two) times daily.       . nitroGLYCERIN (NITRODUR - DOSED IN MG/24 HR) 0.2 mg/hr patch Place 0.2 mg onto the skin daily.      . nitroGLYCERIN (NITROSTAT) 0.4 MG SL tablet Place 0.4 mg under the tongue every 5 (five) minutes as needed for chest pain.      . pantoprazole (PROTONIX) 40 MG tablet Take 40 mg by mouth every morning.       . promethazine (PHENERGAN) 25 MG tablet Take 25 mg by mouth every 6 (six) hours as needed for nausea or vomiting.      . tamsulosin (FLOMAX) 0.4 MG CAPS capsule Take 0.4 mg by  mouth at bedtime.        No current facility-administered medications for this visit.     Allergies:  Allergies  Allergen Reactions  . Other Other (See Comments)    Anesthesia (gas) medication, Reaction: went into coma in the 1960's    Past Medical History, Surgical history, Social history, and Family History were reviewed and updated.   Physical Exam: Blood pressure 95/53, pulse 70, temperature 97.6 F (36.4 C), temperature source Oral, resp. rate 16, height 5\' 7"  (1.702 m), weight 199 lb (90.266 kg), SpO2 90.00%. ECOG: 1 General appearance: alert and cooperative Head: Normocephalic, without obvious abnormality Neck: no adenopathy Lymph nodes: Cervical, supraclavicular, and axillary nodes normal. Heart:regular rate and rhythm, S1, S2 normal, no murmur, click, rub or gallop Lung:chest clear, no wheezing, rales, normal symmetric air entry Abdomin: soft, non-tender, without masses or organomegaly EXT:no erythema, induration, or nodules. 1 + edema.    Lab Results: Lab Results  Component Value Date   WBC 9.7 02/02/2014   HGB 10.7* 02/02/2014   HCT 32.7* 02/02/2014   MCV 80.1 02/02/2014   PLT 189 02/02/2014     Chemistry      Component Value Date/Time   NA 133* 01/19/2014 1633   NA 143 11/10/2013 1039   K 3.4* 01/19/2014 1633   K 4.1 11/10/2013 1039   CL 90* 01/19/2014 1633   CO2 34* 01/19/2014 1633   CO2 32* 11/10/2013 1039   BUN 15 01/19/2014 1633   BUN 15.5 11/10/2013 1039   CREATININE 1.08 01/19/2014 1633   CREATININE 1.4* 11/10/2013 1039   GLU 149* 11/10/2013 1039      Component Value Date/Time   CALCIUM 8.2* 01/19/2014 1633   CALCIUM 9.1 11/10/2013 1039   CALCIUM 8.1* 08/26/2012 1312   ALKPHOS 78 01/19/2014 1633   ALKPHOS 93 11/10/2013 1039   AST 20 01/19/2014 1633   AST 13 11/10/2013 1039   ALT 64* 01/19/2014 1633   ALT 23 11/10/2013 1039   BILITOT 1.2 01/19/2014 1633   BILITOT 0.46 11/10/2013 1039       Impression and Plan:   73 year old gentleman with the following issues: 1.  Metastatic renal cell carcinoma with initial diagnosis in 2009. He presented with hematuria and underwent a right nephrectomy done at [facility]. Now has metastatic disease to the bone confirmed by biopsy. Risks and benefits of systemic therapy were discussed with the patient today. Oral medications such his Votient were discussed including the potential benefit. Having the benefit from this medication is rather marginal considering the potential side effects associated with it. This coupled with his generalized poor health I have my doubts whether this medication will offer much of any palliation. He might actually complicate his wife further given the potential side effects.  I think given his poor mobility and performance status I think observation and surveillance as well as supportive care for the time being would be a better option than prescribing a medication that could worsen his quality of life.  Having said that, we will continue to monitor him and certainly we can change that approach in the future.  2. Cervical spine mass: He is status post radiation therapy to his cervical mass.  3. Bone directed therapy: He will be a good candidate for Xgeva which we will discuss in future visits.  4. Followup: Will be in 2 months to assess his status.    Zola Button, MD 8/26/20152:32 PM

## 2014-02-02 NOTE — Progress Notes (Signed)
Made referral to laren mullis, SW. Patient unable to walk, lives alone.

## 2014-02-02 NOTE — Progress Notes (Signed)
Radiation Oncology         [phone]) [phone] ________________________________  Name: [name] MRN: [id number]  Date: 02/02/2014  DOB: [birth year]  Follow-Up Visit Note  Outpatient  CC: Maricela Curet, MD  Maricela Curet, MD  Diagnosis and Prior Radiotherapy:   STAGE IV RENAL CELL CARCINOMA with SPINE METASTASES  Indication for treatment: palliative  Radiation treatment dates: 12/16/2013-12/29/2013  Site/dose: 1) C2-C4 spine / 30 Gy in 10 fractions  2) T9-T11 spine / 30 Gy in 10 fractions   Narrative:  The patient returns today for routine follow-up.  He is traveling by wheelchair today. He denies any pain in the prior treatment fields It is difficult for him to stand and ambulate to the point that he had to sit on the steps at home and "bump" down the steps then he crawled to the car. He was weighed in the wheelchair by medical oncology today prior to arrival in radiation oncology. His closest family member, who is accompanying him today, lives 15 miles away. She is the only caregiver.   Still on Decadron.  Reports diffuse back/spine pain.                         ALLERGIES:  is allergic to other.  Meds: Current Outpatient Prescriptions  Medication Sig Dispense Refill  . amiodarone (PACERONE) 200 MG tablet Take 200 mg by mouth every morning. For a-fib      . COMBIVENT RESPIMAT 20-100 MCG/ACT AERS respimat Inhale 1 puff into the lungs every 6 (six) hours as needed for shortness of breath.       . dexamethasone (DECADRON) 4 MG tablet Take 4 mg by mouth 2 (two) times daily with a meal.      . diltiazem (TIAZAC) 360 MG 24 hr capsule Take 360 mg by mouth every morning.       . furosemide (LASIX) 40 MG tablet Take 40 mg by mouth every morning.       Marland Kitchen HYDROcodone-acetaminophen (NORCO) 10-325 MG per tablet Take 1 tablet by mouth every 6 (six) hours as needed (pain).  120 tablet  0  . insulin aspart (NOVOLOG FLEXPEN) 100 UNIT/ML FlexPen Inject 5 Units into the skin 3 (three) times daily  before meals. He takes for CBG's > 150.      Marland Kitchen Insulin Glargine (LANTUS) 100 UNIT/ML Solostar Pen Inject 50 Units into the skin at bedtime.       Marland Kitchen ipratropium-albuterol (DUONEB) 0.5-2.5 (3) MG/3ML SOLN Take 3 mLs by nebulization every 4 (four) hours as needed (For shortness of breath.).      Marland Kitchen methadone (DOLOPHINE) 10 MG tablet Take 10 mg by mouth every 12 (twelve) hours as needed (For pain.).       Marland Kitchen metoprolol (LOPRESSOR) 50 MG tablet Take 50 mg by mouth 2 (two) times daily.       . nitroGLYCERIN (NITRODUR - DOSED IN MG/24 HR) 0.2 mg/hr patch Place 0.2 mg onto the skin daily.      . nitroGLYCERIN (NITROSTAT) 0.4 MG SL tablet Place 0.4 mg under the tongue every 5 (five) minutes as needed for chest pain.      . pantoprazole (PROTONIX) 40 MG tablet Take 40 mg by mouth every morning.       . promethazine (PHENERGAN) 25 MG tablet Take 25 mg by mouth every 6 (six) hours as needed for nausea or vomiting.      . tamsulosin (FLOMAX) 0.4 MG CAPS capsule Take  0.4 mg by mouth at bedtime.        No current facility-administered medications for this encounter.    Physical Findings: The patient is in no acute distress. Patient is alert and oriented.  height is 5\' 7"  (1.702 m) and weight is 199 lb (90.266 kg). His temperature is 98.3 F (36.8 C). His blood pressure is 96/53 and his pulse is 70. His oxygen saturation is 92%. .    In wheelchair.  Dozing in and out of conversation.  Oropharynx clear.  Hyperpigmentation and dryness over neck and back in RT fields.  Right hip flexion is weaker than left. Multiple small abrasians/ bruises on arms. Lungs relatively clear.  Lab Findings: Lab Results  Component Value Date   WBC 9.7 02/02/2014   HGB 10.7* 02/02/2014   HCT 32.7* 02/02/2014   MCV 80.1 02/02/2014   PLT 189 02/02/2014    Radiographic Findings: Dg Chest 2 View  01/19/2014   CLINICAL DATA:  Assess for pleural effusion, vascular congestion. Bilateral feet swelling and weakness.  EXAM: CHEST  2 VIEW   COMPARISON:  01/11/2014.  FINDINGS: Trachea is midline. Heart is enlarged. Descending thoracic aorta a is uncoiled. Linear subsegmental atelectasis at the lung bases. No pulmonary edema. Small left pleural effusion.  Postoperative changes are seen in the thoracolumbar spine. Embolized methylmethacrylate is seen in association with a lower thoracic vertebral body, as before.  IMPRESSION: 1. No definite pulmonary edema. 2. Tiny left pleural effusion.   Electronically Signed   By: Lorin Picket M.D.   On: 01/19/2014 16:58   US Venous Img Lower Bilateral  01/11/2014   CLINICAL DATA:  Bilateral leg swelling.  EXAM: BILATERAL LOWER EXTREMITY VENOUS DOPPLER ULTRASOUND  TECHNIQUE: Gray-scale sonography with graded compression, as well as color Doppler and duplex ultrasound were performed to evaluate the lower extremity deep venous systems from the level of the common femoral vein and including the common femoral, femoral, profunda femoral, popliteal and calf veins including the posterior tibial, peroneal and gastrocnemius veins when visible. The superficial great saphenous vein was also interrogated. Spectral Doppler was utilized to evaluate flow at rest and with distal augmentation maneuvers in the common femoral, femoral and popliteal veins.  COMPARISON:  None.  FINDINGS: RIGHT LOWER EXTREMITY  Normal compressibility, augmentation and color Doppler flow in the right common femoral vein, right femoral vein and right popliteal vein. Visualized right great saphenous vein is patent. Visualized right calf veins are patent.  LEFT LOWER EXTREMITY  Normal compressibility, augmentation and color Doppler flow in the left common femoral vein, left femoral vein and left popliteal vein. Visualized left great saphenous vein is patent. Visualized left calf veins are patent.  Other Findings: Soft tissue edema in the left lower calf and left ankle.  IMPRESSION: Negative for deep vein thrombosis in the lower extremities.    Electronically Signed   By: Markus Daft M.D.   On: 01/11/2014 16:49   Dg Chest Portable 1 View  01/11/2014   CLINICAL DATA:  Leg swelling.  Pain.  EXAM: PORTABLE CHEST - 1 VIEW  COMPARISON:  One-view chest 12/01/2013.  FINDINGS: The heart is enlarged. There is no significant edema or effusion suggest failure. The lung volumes are low. Thoracolumbar fusion is again noted.  IMPRESSION: 1. No acute cardiopulmonary disease or significant interval change. 2. Stable cardiomegaly without failure.   Electronically Signed   By: Lawrence Santiago M.D.   On: 01/11/2014 16:37    Impression/Plan: I called Dr Alen Blew, who saw patient  today, and we agreed he is an excellent hospice candidate.  I explained to the patient and his friend that his symptoms, QOL, and possibly longevity could be helped by hospice.  He is not a candidate for chemotherapy. I do not recommend any more procedures for him either, at present.  The patient was too somnolent to discuss this in depth, but open to having his friend discuss hospice with our Education officer, museum. His closest family member, who is accompanying him today, lives 81 miles away. She is the only caregiver.  Patient and friend given radiaplex for skin. Hydrocodone refilled x 1 mo.  Methadone to be Rx'd by MD who manages this medication. Continue Decadron due to back pain/ neurologic deficits.  May eventually be tapered by hospice, if tolerated with adequate pain control.  Followup PRN.  Followup as scheduled with Dr. Alen Blew. _____________________________________   Eppie Gibson, MD

## 2014-02-04 ENCOUNTER — Encounter: Payer: Self-pay | Admitting: *Deleted

## 2014-02-04 NOTE — Progress Notes (Signed)
[place] Work  Clinical Social Work was referred by radiation oncologist to discuss Hospice with patient and friend [name] and to make referral if patient is agreeable.  Clinical Social Worker contacted patient by phone- [place] shared information regarding Hospice support and patient was agreeable to referral.  Mr. Trebilcock requested CSW contact [name] to give additional information.  CSW contacted Ms. Owens Shark by phone- patient's friend agreed patient needs support at home.  CSW provided Ms. Owens Shark with information on in-home Hospice and residential Hospice.  CSW made referral to [facility].  Patient and patient's friend plan to contact CSW if any questions or concerns arise.  Polo Riley, MSW, LCSW, OSW-C Clinical Social Worker [facility] [phone]

## 2014-02-08 ENCOUNTER — Encounter: Payer: Self-pay | Admitting: Radiation Oncology

## 2014-02-08 ENCOUNTER — Telehealth: Payer: Self-pay | Admitting: *Deleted

## 2014-02-08 NOTE — Telephone Encounter (Signed)
[name] returned call saying I was at work [employer] home now, [employer] rweturning Dr.Squire phone call and was told to call this number", informed her Dr.Squire was in [facility] but that I would call there and e-mail her as well, her nurse Malachy Mood, was at lunch, I called and left voice message on [place] ,e-mailed MD and paged her as well 3:46 PM

## 2014-02-08 NOTE — Progress Notes (Signed)
Returned call of patient's friend, Butch Penny. Left VM asking her to call back. -----------------------------------  Eppie Gibson, MD

## 2014-02-09 ENCOUNTER — Encounter (HOSPITAL_COMMUNITY): Payer: Self-pay | Admitting: Emergency Medicine

## 2014-02-09 ENCOUNTER — Emergency Department (HOSPITAL_COMMUNITY): Payer: Medicare Other

## 2014-02-09 ENCOUNTER — Encounter: Payer: Self-pay | Admitting: Radiation Oncology

## 2014-02-09 ENCOUNTER — Observation Stay (HOSPITAL_COMMUNITY)
Admission: EM | Admit: 2014-02-09 | Discharge: 2014-02-10 | DRG: 948 | Disposition: A | Discharge status: Skilled Nursing Facility | Payer: Medicare Other | Attending: Family Medicine | Admitting: Family Medicine

## 2014-02-09 DIAGNOSIS — C7952 Secondary malignant neoplasm of bone marrow: Secondary | ICD-10-CM

## 2014-02-09 DIAGNOSIS — I10 Essential (primary) hypertension: Secondary | ICD-10-CM

## 2014-02-09 DIAGNOSIS — Z9861 Coronary angioplasty status: Secondary | ICD-10-CM | POA: Diagnosis not present

## 2014-02-09 DIAGNOSIS — I509 Heart failure, unspecified: Secondary | ICD-10-CM | POA: Diagnosis present

## 2014-02-09 DIAGNOSIS — J4489 Other specified chronic obstructive pulmonary disease: Secondary | ICD-10-CM | POA: Diagnosis present

## 2014-02-09 DIAGNOSIS — I2510 Atherosclerotic heart disease of native coronary artery without angina pectoris: Secondary | ICD-10-CM | POA: Diagnosis present

## 2014-02-09 DIAGNOSIS — E119 Type 2 diabetes mellitus without complications: Secondary | ICD-10-CM | POA: Diagnosis present

## 2014-02-09 DIAGNOSIS — C7951 Secondary malignant neoplasm of bone: Secondary | ICD-10-CM | POA: Diagnosis present

## 2014-02-09 DIAGNOSIS — F17200 Nicotine dependence, unspecified, uncomplicated: Secondary | ICD-10-CM | POA: Diagnosis present

## 2014-02-09 DIAGNOSIS — E860 Dehydration: Secondary | ICD-10-CM | POA: Diagnosis present

## 2014-02-09 DIAGNOSIS — I4891 Unspecified atrial fibrillation: Secondary | ICD-10-CM | POA: Diagnosis present

## 2014-02-09 DIAGNOSIS — E1159 Type 2 diabetes mellitus with other circulatory complications: Secondary | ICD-10-CM | POA: Diagnosis present

## 2014-02-09 DIAGNOSIS — J810 Acute pulmonary edema: Secondary | ICD-10-CM

## 2014-02-09 DIAGNOSIS — E7800 Pure hypercholesterolemia, unspecified: Secondary | ICD-10-CM | POA: Diagnosis present

## 2014-02-09 DIAGNOSIS — Z794 Long term (current) use of insulin: Secondary | ICD-10-CM | POA: Diagnosis not present

## 2014-02-09 DIAGNOSIS — G4733 Obstructive sleep apnea (adult) (pediatric): Secondary | ICD-10-CM | POA: Diagnosis present

## 2014-02-09 DIAGNOSIS — S68118A Complete traumatic metacarpophalangeal amputation of other finger, initial encounter: Secondary | ICD-10-CM | POA: Diagnosis not present

## 2014-02-09 DIAGNOSIS — R531 Weakness: Secondary | ICD-10-CM | POA: Diagnosis present

## 2014-02-09 DIAGNOSIS — R5381 Other malaise: Secondary | ICD-10-CM | POA: Diagnosis present

## 2014-02-09 DIAGNOSIS — J449 Chronic obstructive pulmonary disease, unspecified: Secondary | ICD-10-CM

## 2014-02-09 DIAGNOSIS — Z905 Acquired absence of kidney: Secondary | ICD-10-CM

## 2014-02-09 DIAGNOSIS — R0902 Hypoxemia: Secondary | ICD-10-CM | POA: Diagnosis present

## 2014-02-09 DIAGNOSIS — Z8249 Family history of ischemic heart disease and other diseases of the circulatory system: Secondary | ICD-10-CM

## 2014-02-09 DIAGNOSIS — Z833 Family history of diabetes mellitus: Secondary | ICD-10-CM | POA: Diagnosis not present

## 2014-02-09 DIAGNOSIS — C649 Malignant neoplasm of unspecified kidney, except renal pelvis: Secondary | ICD-10-CM | POA: Diagnosis present

## 2014-02-09 DIAGNOSIS — IMO0001 Reserved for inherently not codable concepts without codable children: Secondary | ICD-10-CM

## 2014-02-09 DIAGNOSIS — N183 Chronic kidney disease, stage 3 unspecified: Secondary | ICD-10-CM | POA: Diagnosis present

## 2014-02-09 DIAGNOSIS — Z923 Personal history of irradiation: Secondary | ICD-10-CM | POA: Diagnosis not present

## 2014-02-09 DIAGNOSIS — R5383 Other fatigue: Secondary | ICD-10-CM | POA: Diagnosis present

## 2014-02-09 DIAGNOSIS — I129 Hypertensive chronic kidney disease with stage 1 through stage 4 chronic kidney disease, or unspecified chronic kidney disease: Secondary | ICD-10-CM | POA: Diagnosis present

## 2014-02-09 LAB — PROTIME-INR
INR: 1.06 (ref 0.00–1.49)
PROTHROMBIN TIME: 13.8 s (ref 11.6–15.2)

## 2014-02-09 LAB — TROPONIN I: Troponin I: <0.3 ng/mL (ref <0.30)

## 2014-02-09 LAB — COMPREHENSIVE METABOLIC PANEL
ALK PHOS: 86 U/L (ref 39–117)
ALT: 42 U/L (ref 0–53)
AST: 28 U/L (ref 0–37)
Albumin: 2 g/dL — ABNORMAL LOW (ref 3.5–5.2)
Anion gap: 13 (ref 5–15)
BILIRUBIN TOTAL: 0.5 mg/dL (ref 0.3–1.2)
BUN: 23 mg/dL (ref 6–23)
CHLORIDE: 97 meq/L (ref 96–112)
CO2: 31 mEq/L (ref 19–32)
Calcium: 8.4 mg/dL (ref 8.4–10.5)
Creatinine, Ser: 1.19 mg/dL (ref 0.50–1.35)
GFR calc non Af Amer: 60 mL/min — ABNORMAL LOW (ref >90)
GFR, EST AFRICAN AMERICAN: 70 mL/min — AB (ref >90)
Glucose, Bld: 93 mg/dL (ref 70–99)
Potassium: 4.2 mEq/L (ref 3.7–5.3)
SODIUM: 141 meq/L (ref 137–147)
Total Protein: 6 g/dL (ref 6.0–8.3)

## 2014-02-09 LAB — CBC WITH DIFFERENTIAL/PLATELET
Basophils Absolute: 0 10*3/uL (ref 0.0–0.1)
Basophils Relative: 0 % (ref 0–1)
Eosinophils Absolute: 0 10*3/uL (ref 0.0–0.7)
Eosinophils Relative: 0 % (ref 0–5)
HCT: 36.2 % — ABNORMAL LOW (ref 39.0–52.0)
HEMOGLOBIN: 12 g/dL — AB (ref 13.0–17.0)
LYMPHS ABS: 0.2 10*3/uL — AB (ref 0.7–4.0)
LYMPHS PCT: 3 % — AB (ref 12–46)
MCH: 26.7 pg (ref 26.0–34.0)
MCHC: 33.1 g/dL (ref 30.0–36.0)
MCV: 80.6 fL (ref 78.0–100.0)
MONOS PCT: 1 % — AB (ref 3–12)
Monocytes Absolute: 0.1 10*3/uL (ref 0.1–1.0)
Neutro Abs: 7.8 10*3/uL — ABNORMAL HIGH (ref 1.7–7.7)
Neutrophils Relative %: 96 % — ABNORMAL HIGH (ref 43–77)
PLATELETS: 148 10*3/uL — AB (ref 150–400)
RBC: 4.49 MIL/uL (ref 4.22–5.81)
RDW: 20.1 % — ABNORMAL HIGH (ref 11.5–15.5)
WBC: 8.2 10*3/uL (ref 4.0–10.5)

## 2014-02-09 LAB — PRO B NATRIURETIC PEPTIDE: PRO B NATRI PEPTIDE: 406 pg/mL — AB (ref 0–125)

## 2014-02-09 LAB — LACTIC ACID, PLASMA: LACTIC ACID, VENOUS: 1.4 mmol/L (ref 0.5–2.2)

## 2014-02-09 MED ORDER — FUROSEMIDE 10 MG/ML IJ SOLN
40.0000 mg | Freq: Once | INTRAMUSCULAR | Status: AC
  Administered 2014-02-09: 40 mg via INTRAVENOUS
  Filled 2014-02-09: qty 4

## 2014-02-09 NOTE — ED Notes (Signed)
Pt states that he had back surgery in the past, recently admitted to hospital for fall, dehydration, has been slow to recover from hospital stay, was at home today when the wheelchair rolled back causing him to slid to the floor, ems arrived to residence but pt refused transportation at that time, pt continued to have mid thoracic back pain, pt has ace bandage in place to left forearm area, swelling noted to left hand, pt denies any pain with left hand at present, states "I wrapped it earlier because it was sore from the fall" pt mucous membranes, lips dry, pt has strong smell of urine noted, per EMS pt appeared to live on his cough, pt states that he lives alone,

## 2014-02-09 NOTE — ED Notes (Signed)
Pt reports that he has had several falls over the past two days, that he had back surgery 3 weeks ago, pt has swelling noted to bilateral lower extremities, unable to palpate pedal pulses, was able to doppler bilateral PD and DP pulses. Pt has multiple abrasions noted, ulcer with redness and bruising noted to buttock region, abrasion noted to left flank area.

## 2014-02-09 NOTE — ED Provider Notes (Signed)
CSN: [id number]     Arrival date & time 02/09/14  2145 History  This chart was scribed for Bobby Essex, MD by Delphia Grates, ED Scribe. This patient was seen in room APA14/APA14 and the patient's care was started at 10:12 PM.    Chief Complaint  Patient presents with  . Back Pain     The history is provided by the patient. No language interpreter was used.    HPI Comments: [name] is a 73 y.o. male brought in by ambulance, who presents to the Emergency Department complaining of gradually worsening back pain onset PTA. Patient reports history of back surgery, and was sitting at home in his wheelchair when it rolled back, causing him to slide to the floor. He states he "did not have the strength to get back up". Patient states he was able to call an ambulance. There is associated abrasions to his left flank and mid back. Patient also reports weakness, but suspects this is due to him not eating today. He denies HA, dizziness, light-headedness, neck pain, SOB, or dysuria. Patient has history of bone cancer (in his back), kidney cancer (nephrectomy; right in 2010), CAD, HTN, DM, LBB, CHF, hepatitis, anemia, A-fib, pneumonia, and COPD  He reports he is finished with his radiation and chemo treatments.  Patient resides alone.    PCP: Dr. Cindie Laroche  Past Medical History  Diagnosis Date  . Coronary artery disease   . Hypertension   . High cholesterol   . Diabetes mellitus   . LBBB (left bundle branch block)   . Paroxysmal atrial fibrillation   . Pneumonia 10-2012  . CHF (congestive heart failure)   . Renal disorder     kidney cancer  . Hepatitis many yrs ago    no current liver problems per pt. reportedly Hepatitis C  . Disc     6 discs loswer back, back brace prn  . Anemia   . Cancer 2010    right nephrectomy  . 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 Complication of anesthesia     years ago went into a coma  . Sleep apnea     cpap setting of 4, or uses oxygen 4 liters per  [name]  . Atrial fibrillation   . COPD (chronic obstructive pulmonary disease)   . S/P radiation therapy 12/16/2013-12/29/2013    1) C2-C4 spine / 30 Gy in 10 fractions / 2) T9-T11 spine / 30 Gy in 10 fractions    Past Surgical History  Procedure Laterality Date  . Nephrectomy Right 2010  . Esophagogastroduodenoscopy Left 10/02/2012    Dr. Paulita Fujita: normal   . Colonoscopy Left 10/03/2012    Dr. Michail Sermon: poorly prepped colon, lipomatous ileocecal valve. Path revealed focal acute inflammation with question of NSAID-induced injury.   . Back surgery  1960's and 1970's    lower x 2  . Left ring finger traumatic amputation      jumped out of truck, finger caught  . Coronary angioplasty with stent placement  yrs ago    x 5 or 6--DES TO LAD AND RAMUS  . Radiology with anesthesia N/A 11/29/2013    Procedure: RADIOLOGY WITH ANESTHESIA;  Surgeon: Consuella Lose, MD;  Location: [facility];  Service: Radiology;  Laterality: N/A;  . Posterior lumbar fusion 4 level N/A 11/30/2013    Procedure: THORACIC TEN LAMINECTOMY DECOMPRESSION WITH THORACIC EIGHT-THORACIC TWELVE INSTRUMENTATION;  Surgeon: Consuella Lose, MD;  Location: [facility];  Service: Neurosurgery;  Laterality: N/A;  Family History  Problem Relation Age of Onset  . Hyperlipidemia Mother   . Diabetes Mother   . Heart attack Father   . Colon cancer Neg Hx   . Crohn's disease Neg Hx   . Ulcerative colitis Neg Hx    History  Substance Use Topics  . Smoking status: Passive Smoke Exposure - Never Smoker  . Smokeless tobacco: Never Used  . Alcohol Use: No     Comment: quit ETOH 25-30 years ago    Review of Systems A complete 10 system review of systems was obtained and all systems are negative except as noted in the HPI and PMH.     Allergies  Other  Home Medications   Prior to Admission medications   Medication Sig Start Date End Date Taking? Authorizing Provider  amiodarone (PACERONE) 200 MG tablet Take 200 mg by mouth every  morning. For a-fib   Yes Historical Provider, MD  COMBIVENT RESPIMAT 20-100 MCG/ACT AERS respimat Inhale 1 puff into the lungs every 6 (six) hours as needed for shortness of breath.  08/09/13  Yes Historical Provider, MD  dexamethasone (DECADRON) 4 MG tablet Take 4 mg by mouth 2 (two) times daily with a meal.   Yes Historical Provider, MD  diltiazem (TIAZAC) 360 MG 24 hr capsule Take 360 mg by mouth every morning.  10/04/13  Yes Historical Provider, MD  furosemide (LASIX) 40 MG tablet Take 40 mg by mouth every morning.    Yes Historical Provider, MD  HYDROcodone-acetaminophen (NORCO) 10-325 MG per tablet Take 1 tablet by mouth every 6 (six) hours as needed (pain). 02/02/14  Yes Eppie Gibson, MD  insulin aspart (NOVOLOG FLEXPEN) 100 UNIT/ML FlexPen Inject 5 Units into the skin 3 (three) times daily before meals. He takes for CBG's > 150.   Yes Historical Provider, MD  Insulin Glargine (LANTUS) 100 UNIT/ML Solostar Pen Inject 50 Units into the skin at bedtime.  01/27/13  Yes Maricela Curet, MD  ipratropium-albuterol (DUONEB) 0.5-2.5 (3) MG/3ML SOLN Take 3 mLs by nebulization every 4 (four) hours as needed (For shortness of breath.).   Yes Historical Provider, MD  methadone (DOLOPHINE) 10 MG tablet Take 10 mg by mouth every 12 (twelve) hours as needed (For pain.).    Yes Historical Provider, MD  metoprolol (LOPRESSOR) 50 MG tablet Take 50 mg by mouth 2 (two) times daily.    Yes Historical Provider, MD  nitroGLYCERIN (NITRODUR - DOSED IN MG/24 HR) 0.2 mg/hr patch Place 0.2 mg onto the skin daily. 08/16/13  Yes Jettie Booze, MD  nitroGLYCERIN (NITROSTAT) 0.4 MG SL tablet Place 0.4 mg under the tongue every 5 (five) minutes as needed for chest pain.   Yes Historical Provider, MD  pantoprazole (PROTONIX) 40 MG tablet Take 40 mg by mouth every morning.    Yes Historical Provider, MD  promethazine (PHENERGAN) 25 MG tablet Take 25 mg by mouth every 6 (six) hours as needed for nausea or vomiting.   Yes  Historical Provider, MD  tamsulosin (FLOMAX) 0.4 MG CAPS capsule Take 0.4 mg by mouth at bedtime.    Yes Historical Provider, MD  HYDROcodone-acetaminophen (NORCO) 10-325 MG per tablet Take 1 tablet by mouth every 6 (six) hours as needed. 02/10/14   Maricela Curet, MD  insulin aspart (NOVOLOG) 100 UNIT/ML injection Inject 0-20 Units into the skin 3 (three) times daily with meals. 02/10/14   Maricela Curet, MD  insulin aspart (NOVOLOG) 100 UNIT/ML injection Inject 0-5 Units into the skin at  bedtime. 02/10/14   Maricela Curet, MD  Insulin Detemir (LEVEMIR FLEXTOUCH) 100 UNIT/ML Pen Inject 40 Units into the skin daily at 10 pm. 02/10/14   Maricela Curet, MD   Triage Vitals: BP 108/80  Pulse 116  Temp(Src) 98.2 F (36.8 C) (Oral)  Resp 22  Ht 5' 7.5" (1.715 m)  Wt 216 lb (97.977 kg)  BMI 33.31 kg/m2  SpO2 91%  Physical Exam  Nursing note and vitals reviewed. Constitutional: He is oriented to person, place, and time. He appears well-developed and well-nourished. No distress.  HENT:  Head: Normocephalic and atraumatic.  Mouth/Throat: Oropharynx is clear and moist. Mucous membranes are dry. No oropharyngeal exudate.  Dry mucous membranes.  Eyes: Conjunctivae and EOM are normal. Pupils are equal, round, and reactive to light.  Neck: Normal range of motion. Neck supple.  No meningismus.  Cardiovascular: Regular rhythm, normal heart sounds and intact distal pulses.  Tachycardia present.   No murmur heard. Pulmonary/Chest: Effort normal and breath sounds normal. No respiratory distress.  Abdominal: Soft. There is no tenderness. There is no rebound and no guarding.  Musculoskeletal: Normal range of motion. He exhibits edema. He exhibits no tenderness.  Thoracic midline incision that appears well healed. 4/5 strength throughout. Difficult to palpate lower extremity pulses. +3 pitting edema to the knees bilaterally.  Neurological: He is alert and oriented to person, place, and time. No  cranial nerve deficit. He exhibits normal muscle tone. Coordination normal.  No ataxia on finger to nose bilaterally. No pronator drift. CN 2-12 intact.Equal grip strength. Sensation intact.  Skin: Skin is warm.  There is abrasion to the left flank and midback. Erythema of the bilateral buttocks. Multiple areas of ulceration.   Psychiatric: He has a normal mood and affect. His behavior is normal.    ED Course  Procedures (including critical care time)  DIAGNOSTIC STUDIES: Oxygen Saturation is 91% on room air, adequate by my interpretation.    COORDINATION OF CARE: At 2222 Discussed treatment plan with patient which includes imaging an IV fluids. Patient agrees.   Labs Review Labs Reviewed  CBC WITH DIFFERENTIAL - Abnormal; Notable for the following:    Hemoglobin 12.0 (*)    HCT 36.2 (*)    RDW 20.1 (*)    Platelets 148 (*)    Neutrophils Relative % 96 (*)    Neutro Abs 7.8 (*)    Lymphocytes Relative 3 (*)    Lymphs Abs 0.2 (*)    Monocytes Relative 1 (*)    All other components within normal limits  COMPREHENSIVE METABOLIC PANEL - Abnormal; Notable for the following:    Albumin 2.0 (*)    GFR calc non Af Amer 60 (*)    GFR calc Af Amer 70 (*)    All other components within normal limits  URINALYSIS, ROUTINE W REFLEX MICROSCOPIC - Abnormal; Notable for the following:    Hgb urine dipstick MODERATE (*)    Bilirubin Urine SMALL (*)    Ketones, ur TRACE (*)    Protein, ur 30 (*)    All other components within normal limits  PRO B NATRIURETIC PEPTIDE - Abnormal; Notable for the following:    Pro B Natriuretic peptide (BNP) 406.0 (*)    All other components within normal limits  BLOOD GAS, ARTERIAL - Abnormal; Notable for the following:    pH, Arterial 7.454 (*)    pCO2 arterial 48.7 (*)    pO2, Arterial 63.2 (*)    Bicarbonate 33.6 (*)  Acid-Base Excess 9.3 (*)    All other components within normal limits  URINE MICROSCOPIC-ADD ON - Abnormal; Notable for the  following:    Squamous Epithelial / LPF FEW (*)    Bacteria, UA MANY (*)    All other components within normal limits  BASIC METABOLIC PANEL - Abnormal; Notable for the following:    CO2 33 (*)    Glucose, Bld 118 (*)    Calcium 7.9 (*)    GFR calc non Af Amer 61 (*)    GFR calc Af Amer 70 (*)    All other components within normal limits  CBC - Abnormal; Notable for the following:    RBC 4.15 (*)    Hemoglobin 11.1 (*)    HCT 33.8 (*)    RDW 20.3 (*)    All other components within normal limits  GLUCOSE, CAPILLARY - Abnormal; Notable for the following:    Glucose-Capillary 134 (*)    All other components within normal limits  CULTURE, BLOOD (ROUTINE X 2)  CULTURE, BLOOD (ROUTINE X 2)  URINE CULTURE  TROPONIN I  PROTIME-INR  LACTIC ACID, PLASMA  TSH    Imaging Review Ct Head Wo Contrast  02/10/2014   CLINICAL DATA:  BACK PAIN  EXAM: CT HEAD WITHOUT CONTRAST  TECHNIQUE: Contiguous axial images were obtained from the base of the skull through the vertex without intravenous contrast.  COMPARISON:  09/24/2012  FINDINGS: Atherosclerotic and physiologic intracranial calcifications. 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 Stable small lacunar infarcts or prominent perivascular spaces in the right basal ganglia and thalamus. Diffuse parenchymal atrophy. Patchy areas of hypoattenuation in deep and periventricular white matter bilaterally. Negative for acute intracranial hemorrhage, mass lesion, acute infarction, midline shift, or mass-effect. Acute infarct may be inapparent on noncontrast CT. Ventricles and sulci symmetric. Bone windows demonstrate no focal lesion.  IMPRESSION: 1. Negative for bleed or other acute intracranial process. 2. Mild atrophy and nonspecific white matter changes as before. 3. 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   Electronically Signed   By: Arne Cleveland M.D.   On: 02/10/2014 00:06   Dg Chest Portable 1  View  02/09/2014   CLINICAL DATA:  BACK PAIN  EXAM: PORTABLE CHEST - 1 VIEW  COMPARISON:  01/19/2014  FINDINGS: New moderate diffuse interstitial and airspace opacities, with confluent disease at at the right lung base. Stable cardiomegaly. Tortuous aorta. Thoracolumbar fixation hardware partially visualized. No effusion.  IMPRESSION: 1. New   asymmetric infiltrates or edema, right worse than left. 2. Stable cardiomegaly.   Electronically Signed   By: Arne Cleveland M.D.   On: 02/09/2014 23:11     EKG Interpretation   Date/Time:  Wednesday February 09 2014 21:46:59 EDT Ventricular Rate:  115 PR Interval:  136 QRS Duration: 153 QT Interval:  377 QTC Calculation: 521 R Axis:   1 Text Interpretation:  Sinus tachycardia Consider right atrial enlargement  Left bundle branch block Artifact in lead(s) I III aVR aVL aVF Confirmed  by Jeneen Rinks  MD, [place] ([id number]) on 02/09/2014 10:00:25 PM      MDM   Final diagnoses:  Generalized weakness  Acute pulmonary edema   Patient from home with generalized weakness and dehydration. He states he slipped from his wheelchair multiple times today. Denies hitting head or losing consciousness. 73 year old white man who was diagnosed with renal cell carcinoma, grade 2, following a right nephrectomy at [facility] on 06/19/2007. He is  S/P T10 laminectomy and resection of spinal tumor and decompression of the spinal cord on 11/30/2013.  Denies fever, chest pain or shortness of breath. He is tachycardic with dry mucous membranes. Chest x-ray shows pulmonary edema. Blood counts and chemistry stable.  BNP 400 with normal EF on echo 1 month ago.   IV lasix given x1. He is tachycardic and hypoxic but with pulmonary edema pattern on CXR, PE seems less likely.  He is at risk for ARF from IV contrast.   D/w Dr. Marin Comment. He agrees with holding off on CTPE at this time.  Patient is debilitated and unable to take care of self at home. Will need admission.  I personally performed the  services described in this documentation, which was scribed in my presence. The recorded information has been reviewed and is accurate.    Bobby Essex, MD 02/10/14 [phone]

## 2014-02-09 NOTE — ED Notes (Addendum)
Pt pulse ox 79% on RA, pt denies any SOB most of the time, states that he was on oxygen at one time but does not wear it now, pt placed on oxygen at 2 lpm via [place] with improvement in sats to 93%, pt sinus tach on monitor, Dr Wyvonnia Dusky at bedside, ace bandage removed from left arm, cms intact distal

## 2014-02-10 ENCOUNTER — Encounter (HOSPITAL_COMMUNITY): Payer: Self-pay | Admitting: Internal Medicine

## 2014-02-10 DIAGNOSIS — J449 Chronic obstructive pulmonary disease, unspecified: Secondary | ICD-10-CM

## 2014-02-10 DIAGNOSIS — N183 Chronic kidney disease, stage 3 unspecified: Secondary | ICD-10-CM

## 2014-02-10 DIAGNOSIS — R5381 Other malaise: Secondary | ICD-10-CM | POA: Diagnosis not present

## 2014-02-10 DIAGNOSIS — E119 Type 2 diabetes mellitus without complications: Secondary | ICD-10-CM

## 2014-02-10 DIAGNOSIS — Z794 Long term (current) use of insulin: Secondary | ICD-10-CM

## 2014-02-10 DIAGNOSIS — R5383 Other fatigue: Principal | ICD-10-CM

## 2014-02-10 LAB — CBC
HCT: 33.8 % — ABNORMAL LOW (ref 39.0–52.0)
Hemoglobin: 11.1 g/dL — ABNORMAL LOW (ref 13.0–17.0)
MCH: 26.7 pg (ref 26.0–34.0)
MCHC: 32.8 g/dL (ref 30.0–36.0)
MCV: 81.4 fL (ref 78.0–100.0)
PLATELETS: 159 10*3/uL (ref 150–400)
RBC: 4.15 MIL/uL — ABNORMAL LOW (ref 4.22–5.81)
RDW: 20.3 % — ABNORMAL HIGH (ref 11.5–15.5)
WBC: 7.4 10*3/uL (ref 4.0–10.5)

## 2014-02-10 LAB — URINALYSIS, ROUTINE W REFLEX MICROSCOPIC
Glucose, UA: NEGATIVE mg/dL
LEUKOCYTES UA: NEGATIVE
Nitrite: NEGATIVE
PROTEIN: 30 mg/dL — AB
Specific Gravity, Urine: 1.02 (ref 1.005–1.030)
Urobilinogen, UA: 1 mg/dL (ref 0.0–1.0)
pH: 6 (ref 5.0–8.0)

## 2014-02-10 LAB — BASIC METABOLIC PANEL
Anion gap: 11 (ref 5–15)
BUN: 23 mg/dL (ref 6–23)
CO2: 33 mEq/L — ABNORMAL HIGH (ref 19–32)
Calcium: 7.9 mg/dL — ABNORMAL LOW (ref 8.4–10.5)
Chloride: 97 mEq/L (ref 96–112)
Creatinine, Ser: 1.18 mg/dL (ref 0.50–1.35)
GFR, EST AFRICAN AMERICAN: 70 mL/min — AB (ref >90)
GFR, EST NON AFRICAN AMERICAN: 61 mL/min — AB (ref >90)
Glucose, Bld: 118 mg/dL — ABNORMAL HIGH (ref 70–99)
Potassium: 3.8 mEq/L (ref 3.7–5.3)
SODIUM: 141 meq/L (ref 137–147)

## 2014-02-10 LAB — BLOOD GAS, ARTERIAL
Acid-Base Excess: 9.3 mmol/L — ABNORMAL HIGH (ref 0.0–2.0)
Bicarbonate: 33.6 meq/L — ABNORMAL HIGH (ref 20.0–24.0)
Drawn by: 21310
O2 Content: 2 L/min
O2 Saturation: 92 %
Patient temperature: 37
TCO2: 26.8 mmol/L (ref 0–100)
pCO2 arterial: 48.7 mmHg — ABNORMAL HIGH (ref 35.0–45.0)
pH, Arterial: 7.454 — ABNORMAL HIGH (ref 7.350–7.450)
pO2, Arterial: 63.2 mmHg — ABNORMAL LOW (ref 80.0–100.0)

## 2014-02-10 LAB — URINE MICROSCOPIC-ADD ON

## 2014-02-10 LAB — TSH: TSH: 1.14 u[IU]/mL (ref 0.350–4.500)

## 2014-02-10 LAB — GLUCOSE, CAPILLARY: Glucose-Capillary: 134 mg/dL — ABNORMAL HIGH (ref 70–99)

## 2014-02-10 MED ORDER — CETYLPYRIDINIUM CHLORIDE 0.05 % MT LIQD
7.0000 mL | Freq: Two times a day (BID) | OROMUCOSAL | Status: DC
  Administered 2014-02-10 (×2): 7 mL via OROMUCOSAL

## 2014-02-10 MED ORDER — METHADONE HCL 10 MG PO TABS: 10.0000 mg | ORAL_TABLET | Freq: Two times a day (BID) | ORAL | Status: DC | PRN

## 2014-02-10 MED ORDER — SODIUM CHLORIDE 0.9 % IJ SOLN
3.0000 mL | Freq: Two times a day (BID) | INTRAMUSCULAR | Status: DC
  Administered 2014-02-10: 3 mL via INTRAVENOUS

## 2014-02-10 MED ORDER — IPRATROPIUM-ALBUTEROL 0.5-2.5 (3) MG/3ML IN SOLN: 3.0000 mL | RESPIRATORY_TRACT | Status: DC | PRN

## 2014-02-10 MED ORDER — SODIUM CHLORIDE 0.9 % IJ SOLN: 3.0000 mL | INTRAMUSCULAR | Status: DC | PRN

## 2014-02-10 MED ORDER — TAMSULOSIN HCL 0.4 MG PO CAPS: 0.4000 mg | ORAL_CAPSULE | Freq: Every day | ORAL | Status: DC

## 2014-02-10 MED ORDER — PANTOPRAZOLE SODIUM 40 MG PO TBEC
40.0000 mg | DELAYED_RELEASE_TABLET | Freq: Every morning | ORAL | Status: DC
  Administered 2014-02-10: 40 mg via ORAL
  Filled 2014-02-10: qty 1

## 2014-02-10 MED ORDER — INSULIN ASPART 100 UNIT/ML ~~LOC~~ SOLN: 0.0000 [IU] | Freq: Every day | SUBCUTANEOUS | Status: AC

## 2014-02-10 MED ORDER — DEXAMETHASONE 4 MG PO TABS
4.0000 mg | ORAL_TABLET | Freq: Two times a day (BID) | ORAL | Status: DC
  Administered 2014-02-10: 4 mg via ORAL
  Filled 2014-02-10 (×3): qty 1

## 2014-02-10 MED ORDER — SODIUM CHLORIDE 0.9 % IJ SOLN
3.0000 mL | Freq: Two times a day (BID) | INTRAMUSCULAR | Status: DC
  Administered 2014-02-10 (×2): 3 mL via INTRAVENOUS

## 2014-02-10 MED ORDER — INSULIN ASPART 100 UNIT/ML ~~LOC~~ SOLN
0.0000 [IU] | Freq: Three times a day (TID) | SUBCUTANEOUS | Status: DC
  Administered 2014-02-10: 3 [IU] via SUBCUTANEOUS

## 2014-02-10 MED ORDER — SODIUM CHLORIDE 0.9 % IV SOLN: 250.0000 mL | INTRAVENOUS | Status: DC | PRN

## 2014-02-10 MED ORDER — INSULIN ASPART 100 UNIT/ML ~~LOC~~ SOLN: 0.0000 [IU] | Freq: Three times a day (TID) | SUBCUTANEOUS | Status: AC

## 2014-02-10 MED ORDER — FUROSEMIDE 40 MG PO TABS
40.0000 mg | ORAL_TABLET | Freq: Every morning | ORAL | Status: DC
Start: 2014-02-10 — End: 2014-02-10
  Administered 2014-02-10: 40 mg via ORAL
  Filled 2014-02-10: qty 1

## 2014-02-10 MED ORDER — HEPARIN SODIUM (PORCINE) 5000 UNIT/ML IJ SOLN
5000.0000 [IU] | Freq: Three times a day (TID) | INTRAMUSCULAR | Status: DC
  Administered 2014-02-10 (×2): 5000 [IU] via SUBCUTANEOUS
  Filled 2014-02-10 (×2): qty 1

## 2014-02-10 MED ORDER — HYDROCODONE-ACETAMINOPHEN 10-325 MG PO TABS
1.0000 | ORAL_TABLET | Freq: Four times a day (QID) | ORAL | Status: DC | PRN
Start: 2014-02-10 — End: 2014-02-10
  Administered 2014-02-10: 1 via ORAL
  Filled 2014-02-10: qty 1

## 2014-02-10 MED ORDER — INSULIN GLARGINE 100 UNIT/ML ~~LOC~~ SOLN
50.0000 [IU] | Freq: Every day | SUBCUTANEOUS | Status: DC
Start: 2014-02-10 — End: 2014-02-10
  Filled 2014-02-10: qty 0.5

## 2014-02-10 MED ORDER — METOPROLOL TARTRATE 50 MG PO TABS
50.0000 mg | ORAL_TABLET | Freq: Two times a day (BID) | ORAL | Status: DC
  Administered 2014-02-10 (×2): 50 mg via ORAL
  Filled 2014-02-10 (×2): qty 1

## 2014-02-10 MED ORDER — INSULIN ASPART 100 UNIT/ML ~~LOC~~ SOLN: 0.0000 [IU] | Freq: Every day | SUBCUTANEOUS | Status: DC

## 2014-02-10 MED ORDER — INSULIN DETEMIR 100 UNIT/ML FLEXPEN: 40.0000 [IU] | PEN_INJECTOR | Freq: Every day | SUBCUTANEOUS | Status: AC

## 2014-02-10 MED ORDER — DILTIAZEM HCL ER COATED BEADS 180 MG PO CP24: 360.0000 mg | ORAL_CAPSULE | Freq: Every day | ORAL | Status: DC

## 2014-02-10 MED ORDER — INSULIN GLARGINE 100 UNIT/ML ~~LOC~~ SOLN
40.0000 [IU] | Freq: Every day | SUBCUTANEOUS | Status: DC
  Filled 2014-02-10: qty 0.4

## 2014-02-10 MED ORDER — IPRATROPIUM-ALBUTEROL 20-100 MCG/ACT IN AERS: 1.0000 | INHALATION_SPRAY | Freq: Four times a day (QID) | RESPIRATORY_TRACT | Status: DC | PRN

## 2014-02-10 MED ORDER — DILTIAZEM HCL ER BEADS 240 MG PO CP24
360.0000 mg | ORAL_CAPSULE | Freq: Every morning | ORAL | Status: DC
  Filled 2014-02-10 (×4): qty 1

## 2014-02-10 MED ORDER — NITROGLYCERIN 0.2 MG/HR TD PT24
0.2000 mg | MEDICATED_PATCH | Freq: Every day | TRANSDERMAL | Status: DC
  Administered 2014-02-10: 0.2 mg via TRANSDERMAL
  Filled 2014-02-10 (×2): qty 1

## 2014-02-10 MED ORDER — AMIODARONE HCL 200 MG PO TABS
200.0000 mg | ORAL_TABLET | Freq: Every morning | ORAL | Status: DC
  Administered 2014-02-10: 200 mg via ORAL
  Filled 2014-02-10: qty 1

## 2014-02-10 MED ORDER — INSULIN ASPART 100 UNIT/ML ~~LOC~~ SOLN
5.0000 [IU] | Freq: Three times a day (TID) | SUBCUTANEOUS | Status: DC
  Administered 2014-02-10: 5 [IU] via SUBCUTANEOUS

## 2014-02-10 MED ORDER — HYDROCODONE-ACETAMINOPHEN 10-325 MG PO TABS: 1.0000 | ORAL_TABLET | Freq: Four times a day (QID) | ORAL | Status: DC | PRN

## 2014-02-10 NOTE — H&P (Signed)
[facility] History and Physical  RAFAY Joseph AVW:[id number] DOB: August 15, 1940    PCP:   Maricela Curet, MD   Chief Complaint: weaknesss, can't live at home alone.  HPI: [name] is an 73 y.o. male with hx of DM, CAD, afib, hx of COPD HTN, renal cell ca with mets, s/p nephrectomy, CKD, spine surgery about three months ago for metastasis to the bone,  presenting with generalized weakness. He lives alone, and has been weaker, and unable to ambulate.  He slid down the chair tonight, but didn't have any falls.  He denied any focal weakness of his extremities.  He has had no fever, chills, SOB, CP, or any change in the chronic pain of his back.  Evalaution in the ER included a head CT showing no acute processes.  His Cr is 1.19, and he has no leukocytosis, with Hb of 12 g per dL.  His CXR showed chronic cardiomegaly and increase in infiltrate either representing vascular congestion or infiltrate.  He has no clinical symptoms of pneumonia.  Hospitalist was asked to admit him for placement into a rehab since he cannot live at home alone and perform duty of ADLs.  Rewiew of Systems:  Constitutional: Negative for malaise, fever and chills. No significant weight loss or weight gain Eyes: Negative for eye pain, redness and discharge, diplopia, visual changes, or flashes of light. ENMT: Negative for ear pain, hoarseness, nasal congestion, sinus pressure and sore throat. No headaches; tinnitus, drooling, or problem swallowing. Cardiovascular: Negative for chest pain, palpitations, diaphoresis, dyspnea and peripheral edema. ; No orthopnea, PND Respiratory: Negative for cough, hemoptysis, wheezing and stridor. No pleuritic chestpain. Gastrointestinal: Negative for nausea, vomiting, diarrhea, constipation, abdominal pain, melena, blood in stool, hematemesis, jaundice and rectal bleeding.    Genitourinary: Negative for frequency, dysuria, incontinence,flank pain and hematuria; Musculoskeletal:   Negative for swelling and trauma.;  Skin: . Negative for pruritus, rash, abrasions, bruising and skin lesion.; ulcerations Neuro: Negative for headache, lightheadedness and neck stiffness. Negative for altered level of consciousness , altered mental status, extremity weakness, burning feet, involuntary movement, seizure and syncope.  Psych: negative for anxiety, depression, insomnia, tearfulness, panic attacks, hallucinations, paranoia, suicidal or homicidal ideation    Past Medical History  Diagnosis Date  . Coronary artery disease   . Hypertension   . High cholesterol   . Diabetes mellitus   . LBBB (left bundle branch block)   . Paroxysmal atrial fibrillation   . Pneumonia 10-2012  . CHF (congestive heart failure)   . Renal disorder     kidney cancer  . Hepatitis many yrs ago    no current liver problems per pt. reportedly Hepatitis C  . Disc     6 discs loswer back, back brace prn  . Anemia   . Cancer 2010    right nephrectomy  . 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 Complication of anesthesia     years ago went into a coma  . Sleep apnea     cpap setting of 4, or uses oxygen 4 liters per [facility]  . Atrial fibrillation   . COPD (chronic obstructive pulmonary disease)   . S/P radiation therapy 12/16/2013-12/29/2013    1) C2-C4 spine / 30 Gy in 10 fractions / 2) T9-T11 spine / 30 Gy in 10 fractions     Past Surgical History  Procedure Laterality Date  . Nephrectomy Right 2010  . Esophagogastroduodenoscopy Left 10/02/2012    Dr. Paulita Fujita: normal   .  Colonoscopy Left 10/03/2012    Dr. Michail Sermon: poorly prepped colon, lipomatous ileocecal valve. Path revealed focal acute inflammation with question of NSAID-induced injury.   . Back surgery  1960's and 1970's    lower x 2  . Left ring finger traumatic amputation      jumped out of truck, finger caught  . Coronary angioplasty with stent placement  yrs ago    x 5 or 6--DES TO LAD AND RAMUS  . Radiology with anesthesia N/A 11/29/2013     Procedure: RADIOLOGY WITH ANESTHESIA;  Surgeon: Consuella Lose, MD;  Location: [facility];  Service: Radiology;  Laterality: N/A;  . Posterior lumbar fusion 4 level N/A 11/30/2013    Procedure: THORACIC TEN LAMINECTOMY DECOMPRESSION WITH THORACIC EIGHT-THORACIC TWELVE INSTRUMENTATION;  Surgeon: Consuella Lose, MD;  Location: [facility];  Service: Neurosurgery;  Laterality: N/A;    Medications:  HOME MEDS: Prior to Admission medications   Medication Sig Start Date End Date Taking? Authorizing Provider  amiodarone (PACERONE) 200 MG tablet Take 200 mg by mouth every morning. For a-fib   Yes Historical Provider, MD  COMBIVENT RESPIMAT 20-100 MCG/ACT AERS respimat Inhale 1 puff into the lungs every 6 (six) hours as needed for shortness of breath.  08/09/13  Yes Historical Provider, MD  dexamethasone (DECADRON) 4 MG tablet Take 4 mg by mouth 2 (two) times daily with a meal.   Yes Historical Provider, MD  diltiazem (TIAZAC) 360 MG 24 hr capsule Take 360 mg by mouth every morning.  10/04/13  Yes Historical Provider, MD  furosemide (LASIX) 40 MG tablet Take 40 mg by mouth every morning.    Yes Historical Provider, MD  HYDROcodone-acetaminophen (NORCO) 10-325 MG per tablet Take 1 tablet by mouth every 6 (six) hours as needed (pain). 02/02/14  Yes Eppie Gibson, MD  insulin aspart (NOVOLOG FLEXPEN) 100 UNIT/ML FlexPen Inject 5 Units into the skin 3 (three) times daily before meals. He takes for CBG's > 150.   Yes Historical Provider, MD  Insulin Glargine (LANTUS) 100 UNIT/ML Solostar Pen Inject 50 Units into the skin at bedtime.  01/27/13  Yes Maricela Curet, MD  ipratropium-albuterol (DUONEB) 0.5-2.5 (3) MG/3ML SOLN Take 3 mLs by nebulization every 4 (four) hours as needed (For shortness of breath.).   Yes Historical Provider, MD  methadone (DOLOPHINE) 10 MG tablet Take 10 mg by mouth every 12 (twelve) hours as needed (For pain.).    Yes Historical Provider, MD  metoprolol (LOPRESSOR) 50 MG tablet Take 50  mg by mouth 2 (two) times daily.    Yes Historical Provider, MD  nitroGLYCERIN (NITRODUR - DOSED IN MG/24 HR) 0.2 mg/hr patch Place 0.2 mg onto the skin daily. 08/16/13  Yes Jettie Booze, MD  nitroGLYCERIN (NITROSTAT) 0.4 MG SL tablet Place 0.4 mg under the tongue every 5 (five) minutes as needed for chest pain.   Yes Historical Provider, MD  pantoprazole (PROTONIX) 40 MG tablet Take 40 mg by mouth every morning.    Yes Historical Provider, MD  promethazine (PHENERGAN) 25 MG tablet Take 25 mg by mouth every 6 (six) hours as needed for nausea or vomiting.   Yes Historical Provider, MD  tamsulosin (FLOMAX) 0.4 MG CAPS capsule Take 0.4 mg by mouth at bedtime.    Yes Historical Provider, MD     Allergies:  Allergies  Allergen Reactions  . Other Other (See Comments)    Anesthesia (gas) medication, Reaction: went into coma in the 1960's    Social History:  reports that he has been passively smoking.  He has never used smokeless tobacco. He reports that he does not drink alcohol or use illicit drugs.  Family History: Family History  Problem Relation Age of Onset  . Hyperlipidemia Mother   . Diabetes Mother   . Heart attack Father   . Colon cancer Neg Hx   . Crohn's disease Neg Hx   . Ulcerative colitis Neg Hx      Physical Exam: Filed Vitals:   02/09/14 2321 02/09/14 2330 02/10/14 0000 02/10/14 0024  BP: 92/42 113/92 97/73 112/82  Pulse: 106  105 108  Temp: 99 F (37.2 C)     TempSrc: Rectal     Resp:  16 13 16   Height:      Weight:      SpO2: 94%  92% 93%   Blood pressure 112/82, pulse 108, temperature 99 F (37.2 C), temperature source Rectal, resp. rate 16, height 5' 7.5" (1.715 m), weight 97.977 kg (216 lb), SpO2 93.00%.  GEN:  Pleasant patient lying in the stretcher in no acute distress; cooperative with exam. PSYCH:  alert and oriented x4; does not appear anxious or depressed; affect is appropriate. HEENT: Mucous membranes pink and anicteric; PERRLA; EOM intact;  no cervical lymphadenopathy nor thyromegaly or carotid bruit; no JVD; There were no stridor. Neck is very supple. Breasts:: Not examined CHEST WALL: No tenderness CHEST: Normal respiration, clear to auscultation bilaterally.  HEART: Regular rate and rhythm.  There are no murmur, rub, or gallops.   BACK: No kyphosis or scoliosis; no CVA tenderness ABDOMEN: soft and non-tender; no masses, no organomegaly, normal abdominal bowel sounds; no pannus; no intertriginous candida. There is no rebound and no distention. Rectal Exam: Not done EXTREMITIES: No bone or joint deformity; age-appropriate arthropathy of the hands and knees; no edema; no ulcerations.  There is no calf tenderness. Genitalia: not examined PULSES: 2+ and symmetric SKIN: Normal hydration no rash or ulceration CNS: Cranial nerves 2-12 grossly intact no focal lateralizing neurologic deficit.  Speech is fluent; uvula elevated with phonation, facial symmetry and tongue midline. cerebella exam is intact, barbinski is negative and strengths are equaled bilaterally.  No sensory loss. Both lower extremities are weak.   Labs on Admission:  Basic Metabolic Panel:  Recent Labs Lab 02/09/14 2219  NA 141  K 4.2  CL 97  CO2 31  GLUCOSE 93  BUN 23  CREATININE 1.19  CALCIUM 8.4   Liver Function Tests:  Recent Labs Lab 02/09/14 2219  AST 28  ALT 42  ALKPHOS 86  BILITOT 0.5  PROT 6.0  ALBUMIN 2.0*     Recent Labs Lab 02/09/14 2219  WBC 8.2  NEUTROABS 7.8*  HGB 12.0*  HCT 36.2*  MCV 80.6  PLT 148*   Cardiac Enzymes:  Recent Labs Lab 02/09/14 2219  TROPONINI <0.30    CBG: No results found for this basename: GLUCAP,  in the last 168 hours   Radiological Exams on Admission: Ct Head Wo Contrast  02/10/2014   CLINICAL DATA:  BACK PAIN  EXAM: CT HEAD WITHOUT CONTRAST  TECHNIQUE: Contiguous axial images were obtained from the base of the skull through the vertex without intravenous contrast.  COMPARISON:  09/24/2012   FINDINGS: Atherosclerotic and physiologic intracranial calcifications. 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 Stable small lacunar infarcts or prominent perivascular spaces in the right basal ganglia and thalamus. Diffuse parenchymal atrophy. Patchy areas of hypoattenuation in deep and periventricular white matter bilaterally.  Negative for acute intracranial hemorrhage, mass lesion, acute infarction, midline shift, or mass-effect. Acute infarct may be inapparent on noncontrast CT. Ventricles and sulci symmetric. Bone windows demonstrate no focal lesion.  IMPRESSION: 1. Negative for bleed or other acute intracranial process. 2. Mild atrophy and nonspecific white matter changes as before. 3. 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   Electronically Signed   By: Arne Cleveland M.D.   On: 02/10/2014 00:06   Dg Chest Portable 1 View  02/09/2014   CLINICAL DATA:  BACK PAIN  EXAM: PORTABLE CHEST - 1 VIEW  COMPARISON:  01/19/2014  FINDINGS: New moderate diffuse interstitial and airspace opacities, with confluent disease at at the right lung base. Stable cardiomegaly. Tortuous aorta. Thoracolumbar fixation hardware partially visualized. No effusion.  IMPRESSION: 1. New   asymmetric infiltrates or edema, right worse than left. 2. Stable cardiomegaly.   Electronically Signed   By: Arne Cleveland M.D.   On: 02/09/2014 23:11   Assessment/Plan Present on Admission:  . Weakness . OSA (obstructive sleep apnea) . Atrial fibrillation . CKD (chronic kidney disease) stage 3, GFR 30-59 ml/min . CAD (coronary artery disease), native coronary artery . Hypertension associated with diabetes . Cancer of kidney . Secondary malignant neoplasm of bone and bone marrow  PLAN:  This gentleman has generalized weakness, and has not been able to perform his ADLs adequately.  I am not sure if its deconditioning, or progression of his metastatic disease.  He doesn't have any  acute focal deterioration of his strength.  Will admit him to Dr [facility] service as per prior arrangement.  Obtain PT eval and consult social service for rehab placement.  I have continued his meds, including his Decadron.  He is otherwise stable.  Thank you for allowing me to participate in the care of your nice patient.  Other plans as per orders.  Code Status: FULL Haskel Khan, MD. [facility] Pager [phone] 7pm to 7am.  02/10/2014, 12:35 AM

## 2014-02-10 NOTE — Clinical Social Work Note (Signed)
CSW discussed sending referral to [place] and [place] as no bed offers yet from [facility]. Pt agreeable and states he was recently at a facility in [place] but could not remember the name. He said that facility would be fine with him. Per notes, this was [facility]. Facility confirmed they had pt in the past and are willing to extend bed offer again. Aware that pt is Medicaid only and they will complete Medicaid prior approval. Pt notified and accepts. Ready for d/c today. CSW spoke with Butch Penny, pt's friend and updated her on d/c plan. She is also agreeable to d/c to [facility]. D/C summary faxed. Pt to transfer via [facility].   Benay Pike, [place]

## 2014-02-10 NOTE — Care Management Note (Addendum)
    Page 1 of 1   02/10/2014     3:27:25 PM CARE MANAGEMENT NOTE 02/10/2014  Patient:  GIVANNI, STARON   Account Number:  [id number]  Date Initiated:  02/10/2014  Documentation initiated by:  Theophilus Kinds  Subjective/Objective Assessment:   Pt admitted from home with weaknesss. Pt lives alone but will probably need placement at discharge. Pt has an aide that comes in to assist but isnt there 24/7.     Action/Plan:   CSW is aware of need for placement. Will continue to follow for discharge planning needs.   Anticipated DC Date:  02/11/2014   Anticipated DC Plan:  SKILLED NURSING FACILITY  In-house referral  Clinical Social Worker      DC Planning Services  CM consult      Choice offered to / List presented to:             Status of service:  Completed, signed off Medicare Important Message given?   (If response is "NO", the following Medicare IM given date fields will be blank) Date Medicare IM given:   Medicare IM given by:   Date Additional Medicare IM given:   Additional Medicare IM given by:    Discharge Disposition:  [place]  Per UR Regulation:    If discussed at Long Length of Stay Meetings, dates discussed:    Comments:  02/10/14 [place], RN BSN CM Pt discharged to [facility] in [place] today. CSW to arrange discharge to facility.  02/10/14 [place], RN BSN CM

## 2014-02-10 NOTE — Progress Notes (Signed)
[id number] 

## 2014-02-10 NOTE — ED Notes (Signed)
Patient states that tailbone area is hurting. Feels like someone has tore the skin off. Patient has bedsore on this area.

## 2014-02-10 NOTE — ED Notes (Signed)
Emptied foley bag of 1000 ml urine.

## 2014-02-10 NOTE — Clinical Social Work Placement (Signed)
Clinical Social Work Department CLINICAL SOCIAL WORK PLACEMENT NOTE 02/10/2014  Patient:  [name], [name]  Account Number:  [id number] Admit date:  02/09/2014  Clinical Social Worker:  Benay Pike, LCSW  Date/time:  02/10/2014 10:59 AM  Clinical Social Work is seeking post-discharge placement for this patient at the following level of care:   SKILLED NURSING   (*CSW will update this form in Epic as items are completed)   02/10/2014  Patient/family provided with [place] Department of Clinical Social Work's list of facilities offering this level of care within the geographic area requested by the patient (or if unable, by the patient's family).  02/10/2014  Patient/family informed of their freedom to choose among providers that offer the needed level of care, that participate in Medicare, Medicaid or managed care program needed by the patient, have an available bed and are willing to accept the patient.  02/10/2014  Patient/family informed of [facility]' ownership interest in [facility], as well as of the fact that they are under no obligation to receive care at this facility.  PASARR submitted to EDS on  PASARR number received on   FL2 transmitted to all facilities in geographic area requested by pt/family on  02/10/2014 FL2 transmitted to all facilities within larger geographic area on   Patient informed that his/her managed care company has contracts with or will negotiate with  certain facilities, including the following:     Patient/family informed of bed offers received:   Patient chooses bed at  Physician recommends and patient chooses bed at    Patient to be transferred to  on   Patient to be transferred to facility by  Patient and family notified of transfer on  Name of family member notified:    The following physician request were entered in Epic:   Additional Comments: Pt has existing pasarr.  Benay Pike, [place]

## 2014-02-10 NOTE — Progress Notes (Signed)
Patient blood sugar 62, carbs given and MD notified. Patient blood sugar 77 when rechecked.

## 2014-02-10 NOTE — Evaluation (Addendum)
Physical Therapy Evaluation Patient Details Name: [name] MRN: [id number] DOB: May 25, 1941 Today's Date: 02/10/2014   History of Present Illness  [name] is an 73 y.o. male with hx of DM, CAD, afib, hx of COPD HTN, renal cell ca with mets, s/p nephrectomy, CKD, spine surgery about three months ago for metastasis to the bone,  presenting with generalized weakness. He lives alone, and has been weaker, and unable to ambulate.  He slid down the chair tonight, but didn't have any falls.  He denied any focal weakness of his extremities.  He has had no fever, chills, SOB, CP, or any change in the chronic pain of his back.  Evalaution in the ER included a head CT showing no acute processes.  His Cr is 1.19, and he has no leukocytosis, with Hb of 12 g per dL.  His CXR showed chronic cardiomegaly and increase in infiltrate either representing vascular congestion or infiltrate.  He has no clinical symptoms of pneumonia.  Hospitalist was asked to admit him for placement into a rehab since he cannot live at home alone and perform duty of ADLs.  Clinical Impression  Pt is a 73 year old male who presents to PT with dx of weakness.  Pt reports increasing weakness over the past 3-4 days, and has been unable to amb.  During evaluation, pt was max assist for bed mobility skills, with max VC for technique and encouragement to participate in functional mobility assessment.  Pt reports pain, fatigue, inability to participate which limited pt active movements.  Once at EOB, pt leaned body forward onto RW and refused upright sitting despite encouragement and offers of assistance to maintain balance, with pt stating "i can't" or "i'm going to fall" throughout assessment.  Pt also refused MMT testing and attempts for transfers or additional bed mobility skills.  Educated pt on importance of attempts for movement to build strength, improve tolerance, and determine appropriate placement, though pt continued to refuse  active participation in treatment session.  Recommend continued PT while in the hospital to address strengthening, activity tolerance, and further assessment of functional mobility skills to determine appropriate discharge recommendations.  Unsure if pt will participate or tolerate transition to SNF rehab at discharge, as pt refused all attempts at mobility skills during PT evaluation; will continue to assess and update recommendations as able.  No DME recommendations at this time, though will continue to update as able.      Follow Up Recommendations Other (comment) (No recommendation at this time as pt refused to participate in PT evaluation despite education and encouragment.  Unsure if pt will be able to tolerate SNF therapy and requirements to particpate in SNF rehab.)    Equipment Recommendations  Other (comment) (None at this time.)       Precautions / Restrictions Precautions Precautions: Fall Restrictions Weight Bearing Restrictions: No      Mobility  Bed Mobility Overal bed mobility: Needs Assistance Bed Mobility: Supine to Sit;Sit to Supine     Supine to sit: Max assist Sit to supine: Max assist   General bed mobility comments: Once at EOB, pt leaned body forward onto RW and refused to sit upright.  Refused MMT testing, and attempts to stand despite education and encouragment.   Transfers                 General transfer comment: Pt refused to attempt.   Ambulation/Gait  General Gait Details: Pt refused to attempt.      Balance Overall balance assessment: Needs assistance Sitting-balance support: Feet supported;Bilateral upper extremity supported Sitting balance-Leahy Scale: Fair Sitting balance - Comments: Pt rested body on RW, and refused to sit upright despite encouragment and assisance stating "i'm going to fall".                                      Pertinent Vitals/Pain Pain Assessment: 0-10 Pain Score: 9  Pain  Location: LBP Pain Intervention(s): Patient requesting pain meds-RN notified;Repositioned;Limited activity within patient's tolerance    Home Living Family/patient expects to be discharged to:: Unsure Living Arrangements: Alone Available Help at Discharge: Friend(s);Available PRN/intermittently Type of Home: Mobile home Home Access: Stairs to enter Entrance Stairs-Rails: Can reach both Entrance Stairs-Number of Steps: 4 Home Layout: One level Home Equipment: Walker - 2 wheels;Cane - single point;Wheelchair - manual Additional Comments: Copywriter, advertising / Transfers Assistance Needed: Pt reports until ~3-4 days ago, he was mod (I) with bed mobility skills, transfers, and ambulation in the home.  Pt reports increasing weakness in past couple of days and inability to ambulate.            Hand Dominance   Dominant Hand: Right    Extremity/Trunk Assessment               Lower Extremity Assessment: Generalized weakness RLE Deficits / Details: Unable to formally assess as pt refused to participate in MMT assessment when at EOB.  LLE Deficits / Details: Unable to formally assess as pt refused to participate in MMT assessment when at EOB.      Communication   Communication: HOH  Cognition Arousal/Alertness: Awake/alert Behavior During Therapy: WFL for tasks assessed/performed Overall Cognitive Status: Within Functional Limits for tasks assessed                        Assessment/Plan    PT Assessment Patient needs continued PT services  PT Diagnosis Difficulty walking;Generalized weakness   PT Problem List Decreased strength;Decreased activity tolerance;Decreased mobility;Decreased safety awareness;Decreased balance  PT Treatment Interventions DME instruction;Gait training;Functional mobility training;Therapeutic activities;Therapeutic exercise;Balance training;Patient/family education;Wheelchair mobility training;Stair training;Neuromuscular  re-education;Modalities   PT Goals (Current goals can be found in the Care Plan section) Acute Rehab PT Goals Patient Stated Goal: decrease pain PT Goal Formulation: With patient Time For Goal Achievement: 03/24/2014 Potential to Achieve Goals: Fair    Frequency Min 2X/week (Frequency can be adjusted when pt will actively participate in treatment session)    End of Session Equipment Utilized During Treatment: Gait belt Activity Tolerance: Patient limited by lethargy;Patient limited by pain;Other (comment) (Limited by pt active participation) Patient left: in bed;with call bell/phone within reach;with bed alarm set           Time: [phone] PT Time Calculation (min): 26 min   Charges:   PT Evaluation $Initial PT Evaluation Tier I: 1 Procedure      Verlaine Embry 03-10-2014, 9:47 AM  ADDENDUM : G-codes Mobility Initial CN, Goal CL

## 2014-02-10 NOTE — Discharge Summary (Signed)
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  He is status post nephrectomy with history of chronic atrial fibrillation, coronary artery disease, insulin-dependent diabetes, COPD, hypertension.  The patient was admitted to the hospital after refusing hospice home care concurrently with his home health care, and presents with generalized weakness and deconditioning and inability to perform his ADLs.  He is metabolically intact upon admission to hospital.  No generalized complaints except for major weakness, inability to ambulate, to take care of personal needs; and he denies angina, orthopnea, PND, dizziness, or syncope.  He has been taking his insulin.  It is imperative to understand he is totally illiterate for reading labels of medicines and complains of generalized pain but no specific spinal pain.  He did denies dyspnea, cough, sputum, or hemoptysis.  Currently, he has been converted to sinus rhythm and maintained on amiodarone; therefore, he is not anticoagulated in addition to his carcinoma and metastatic disease. He is hemodynamically stable while in hospital, had a question of vascular congestion on admission.  An x-ray clinically was not consistent with pneumonitis; therefore, he was not treated.  He subsequently discharged to a skilled nursing facility for strengthening rehab and continuation of care.  He is in no code, no intubation, no CPR; and his list of medicines is to follow with his discharge.  They include amiodarone 200 mg p.o. daily, Combivent Respimat 20-100  q.6h, Decadron 4 mg p.o. daily, diltiazem 360 mg p.o. daily, Lasix 40 p.o. daily, Norco 10/325 p.o. q.i.d. p.r.n., NovoLog 20 units a.c. t.i.d. before each meal, Levemir FlexTouch 40 units subcu at bedtime, methadone 10 mg p.o. t.i.d. p.r.n. for pain, Lopressor XL 50 mg p.o. daily, nitroglycerin, Nitro-Dur patch 0.2 mg/hour, sublingual nitroglycerin 0.4 mg p.r.n. chest pain, Protonix 40 mg p.o. daily, and Flomax 0.4 mg p.o. daily.     Unk Lightning, MD     RMD/MEDQ  D:  02/10/2014  T:  02/10/2014  Job:  [id number]

## 2014-02-10 NOTE — Progress Notes (Signed)
Report called to Butch Penny at [facility] in [place].

## 2014-02-10 NOTE — Clinical Social Work Placement (Signed)
Clinical Social Work Department CLINICAL SOCIAL WORK PLACEMENT NOTE 02/10/2014  Patient:  [name], [name]  Account Number:  [id number] Admit date:  02/09/2014  Clinical Social Worker:  Benay Pike, LCSW  Date/time:  02/10/2014 10:59 AM  Clinical Social Work is seeking post-discharge placement for this patient at the following level of care:   SKILLED NURSING   (*CSW will update this form in Epic as items are completed)   02/10/2014  Patient/family provided with [place] Department of Clinical Social Work's list of facilities offering this level of care within the geographic area requested by the patient (or if unable, by the patient's family).  02/10/2014  Patient/family informed of their freedom to choose among providers that offer the needed level of care, that participate in Medicare, Medicaid or managed care program needed by the patient, have an available bed and are willing to accept the patient.  02/10/2014  Patient/family informed of [facility]' ownership interest in [facility], as well as of the fact that they are under no obligation to receive care at this facility.  PASARR submitted to EDS on  PASARR number received on   FL2 transmitted to all facilities in geographic area requested by pt/family on  02/10/2014 FL2 transmitted to all facilities within larger geographic area on   Patient informed that his/her managed care company has contracts with or will negotiate with  certain facilities, including the following:     Patient/family informed of bed offers received:  02/10/2014 Patient chooses bed at [facility], [place] Physician recommends and patient chooses bed at  [facility], [place]  Patient to be transferred to [place] on  02/10/2014 Patient to be transferred to facility by [facility] Patient and family notified of transfer on 02/10/2014 Name of family member notified:  Butch Penny- friend  The  following physician request were entered in Epic:   Additional Comments: Pt has existing pasarr.  Benay Pike, [place]

## 2014-02-10 NOTE — Discharge Summary (Signed)
[id number] 

## 2014-02-10 NOTE — Progress Notes (Signed)
Late entry for 02/10/14 at [facility]. Patient may need WOC consult, has to following wounds present on admission. Left outer buttocks stage II PU measuring 2 cm/1 cm, Left inner buttocks stage II PU measuring 1cm/.5cm, upper mid stage II PU measuring 1cm/1cm with tunneling measuring 1 cm deep, lower mid (between buttocks) stage II PU measuring 1.5cm/1 with 1cm. On the right buttocks there is deep tissue injury present along with 4 puncture wounds along with blisters. Patient also had abrasions on left flank and upper back from fall. He also had scattered abrasions bilaterally on arms and legs. He had unstageable abrasions on his left leg.

## 2014-02-10 NOTE — Clinical Social Work Psychosocial (Signed)
Clinical Social Work Department BRIEF PSYCHOSOCIAL ASSESSMENT 02/10/2014  Patient:  [name], [name]     Account Number:  [id number]     [place] date:  02/09/2014  Clinical Social Worker:  Wyatt Haste  Date/Time:  02/10/2014 11:00 AM  Referred by:  Physician  Date Referred:  02/10/2014 Referred for  SNF Placement   Other Referral:   Interview type:  Patient Other interview type:    PSYCHOSOCIAL DATA Living Status:  ALONE Admitted from facility:   Level of care:   Primary support name:  Butch Penny Primary support relationship to patient:  FRIEND Degree of support available:   supportive per pt    CURRENT CONCERNS Current Concerns  Post-Acute Placement   Other Concerns:    SOCIAL WORK ASSESSMENT / PLAN CSW met with pt at bedside. Pt alert and oriented and known to CSW from admission at the beginning of August. Pt chose to go home at that time. He reports things had been going well until about 2 weeks ago. Pt states suddenly his legs just didn't work. He was ambulating short distances prior to this, but has since been in a wheelchair and transferring. His friend, Butch Penny is his best support and has been helping some and bringing in food. CSW noted that cancer clinic CSW spoke with pt last week about hospice and he was agreeable. However, pt asks for reminder about what hospice is. When explained to him, he is not interested and instead states, "I want to do whatever I need to so I can get out of my wheelchair and walk." PT tried to evaluate pt this morning, but pt refused to do much. He indicates he is too sore. CSW discussed placement with pt at nursing level with Medicaid as he will most likely not have qualifying stay. He is agreeable to [facility] at this point. CSW will initiate bed search and will discuss with Butch Penny later as well.   Assessment/plan status:  Psychosocial Support/Ongoing Assessment of Needs Other assessment/ plan:   Information/referral to community resources:    SNF list    PATIENT'S/FAMILY'S RESPONSE TO PLAN OF CARE: Pt expresses that things have been difficult at home managing on his own recently. He is open to placement. CSW will follow up.     Benay Pike, [employer]

## 2014-02-11 ENCOUNTER — Other Ambulatory Visit: Payer: Self-pay | Admitting: *Deleted

## 2014-02-11 ENCOUNTER — Non-Acute Institutional Stay (SKILLED_NURSING_FACILITY): Payer: Medicare Other | Admitting: Internal Medicine

## 2014-02-11 ENCOUNTER — Encounter: Payer: Self-pay | Admitting: Internal Medicine

## 2014-02-11 DIAGNOSIS — I482 Chronic atrial fibrillation, unspecified: Secondary | ICD-10-CM

## 2014-02-11 DIAGNOSIS — E119 Type 2 diabetes mellitus without complications: Secondary | ICD-10-CM

## 2014-02-11 DIAGNOSIS — I10 Essential (primary) hypertension: Secondary | ICD-10-CM

## 2014-02-11 DIAGNOSIS — C649 Malignant neoplasm of unspecified kidney, except renal pelvis: Secondary | ICD-10-CM

## 2014-02-11 DIAGNOSIS — L8990 Pressure ulcer of unspecified site, unspecified stage: Secondary | ICD-10-CM

## 2014-02-11 DIAGNOSIS — E1165 Type 2 diabetes mellitus with hyperglycemia: Secondary | ICD-10-CM

## 2014-02-11 DIAGNOSIS — C7952 Secondary malignant neoplasm of bone marrow: Secondary | ICD-10-CM

## 2014-02-11 DIAGNOSIS — C7951 Secondary malignant neoplasm of bone: Secondary | ICD-10-CM

## 2014-02-11 DIAGNOSIS — R5381 Other malaise: Secondary | ICD-10-CM

## 2014-02-11 DIAGNOSIS — I4891 Unspecified atrial fibrillation: Secondary | ICD-10-CM

## 2014-02-11 DIAGNOSIS — L8992 Pressure ulcer of unspecified site, stage 2: Secondary | ICD-10-CM

## 2014-02-11 DIAGNOSIS — J449 Chronic obstructive pulmonary disease, unspecified: Secondary | ICD-10-CM

## 2014-02-11 DIAGNOSIS — E1159 Type 2 diabetes mellitus with other circulatory complications: Secondary | ICD-10-CM

## 2014-02-11 DIAGNOSIS — I152 Hypertension secondary to endocrine disorders: Secondary | ICD-10-CM

## 2014-02-11 DIAGNOSIS — E1169 Type 2 diabetes mellitus with other specified complication: Secondary | ICD-10-CM

## 2014-02-11 LAB — URINE CULTURE
Colony Count: NO GROWTH
Culture: NO GROWTH

## 2014-02-11 LAB — GLUCOSE, CAPILLARY
Glucose-Capillary: 62 mg/dL — ABNORMAL LOW (ref 70–99)
Glucose-Capillary: 77 mg/dL (ref 70–99)

## 2014-02-11 MED ORDER — HYDROCODONE-ACETAMINOPHEN 10-325 MG PO TABS: 1.0000 | ORAL_TABLET | Freq: Four times a day (QID) | ORAL | Status: AC | PRN

## 2014-02-11 MED ORDER — METHADONE HCL 10 MG PO TABS: ORAL_TABLET | ORAL | Status: AC

## 2014-02-11 NOTE — Progress Notes (Signed)
NAMEHORATIO, [name]                ACCOUNT NO.:  [id number]  MEDICAL RECORD NO.:  [id number]  LOCATION:                                 FACILITY:  PHYSICIAN:  Unk Lightning, MDDATE OF BIRTH:  1941/03/13  DATE OF PROCEDURE:  02/10/2014 DATE OF DISCHARGE:  02/10/2014                                PROGRESS NOTE   HISTORY OF PRESENT ILLNESS:  The patient is a 73 year old, white male, recently discharged from the hospital with metastatic renal cell carcinoma, insulin-dependent diabetes, coronary artery disease, chronic AFib, hypertension, metastatic disease to the cervical spine, skull, and thorax.  The patient had been at home.  He had refused hospice care.  He is a no code and presents to the hospital last night due to inability to perform ADLs at home.  Generalized deconditioning.  No discrete neurologic impairment.  His hemoglobin is 12, creatinine is 1.19. Metabolically, he appears intact.  There is a question of vascular congestion versus infiltrate in the lung, but he has no clinical stigmata of pneumonitis, therefore it is not treated.  Essentially, the patient is admitted for placement to skilled nursing facility and rehab for strengthening and conditioning and ambulation.  PHYSICAL EXAMINATION:  GENERAL:  He is alert and oriented, conversive, he is totally illiterate for covering physicians, cannot read his medicine bottles. LUNGS:  Clear to A and P.  No rales, wheezes, or rhonchi. HEART:  Regular rhythm.  No S3, S4.  No heaves, thrills, or rubs. ABDOMEN:  Obese, soft, nontender.  Bowel sounds normoactive. NEUROLOGIC:  Cranial nerves II through XII grossly intact.  The patient moves all four extremities.  Plantars are downgoing.  Sensory motor 5/5 in all four extremities.  No obvious neurologic deficits.  No paraspinal tenderness.  IMPRESSION:  As follows. 1. Insulin-dependent diabetes. 2. Deconditioning. 3. Renal cell carcinoma with metastatic disease  diffuse throughout     cervical spine and thoracic spine. 4. Chronic obstructive pulmonary disease. 5. Hypertension. 6. Coronary artery disease with chronic atrial fibrillation.  PLAN:  Right now is to decrease his Lantus insulin to 40 units at bedtime, continue sliding scale coverage.  Await Social Services and skilled nursing home placement.  We will perform a discharge summary should a bed became available over the next 24 hours.     Unk Lightning, MD     RMD/MEDQ  D:  02/10/2014  T:  02/10/2014  Job:  [id number]

## 2014-02-11 NOTE — Progress Notes (Signed)
Patient ID: [name], male   DOB: 10/04/1940, 73 y.o.   MRN: [id number]     Facility: [facility]   PCP: Maricela Curet, MD  Code Status: DNR  Allergies  Allergen Reactions  . Other Other (See Comments)    Anesthesia (gas) medication, Reaction: went into coma in the 1960's    Chief Complaint: new admission  HPI:  73 y/o male patient is here for STR after hospital admission from 02/09/14-02/10/14 with generalized weakness. Ct head was done and ruled out acute bleed/ acute intracranial process. cxr was suggestive of new infiltrate but since it was not clinically consistent with pneumonia, antibiotic treatment was deferred He has hx of stage IV RCC with spine metastases s/p palliative radiation and is no a chemotherapy candidate. He is on decadron and pain medications for the back pain. He has hxof afib, ckd, CAD, HTN He was seen in his room. He complaints of back pain but denies any there concerns. When asked about goals of care he mentions to let god decide on his condition and then also mentions he would like to live as long as possible. He is alert and oriented to person only  Review of Systems:  Constitutional: Negative for fever, chills, diaphoresis.  HENT: Negative for congestion Respiratory: Negative for cough, shortness of breath and wheezing.   Cardiovascular: Negative for chest pain, palpitations Gastrointestinal: Negative for heartburn, nausea, vomiting, abdominal pain  Genitourinary: has a foley Musculoskeletal: positive for back pain Neurological: Negative for dizziness, theadaches.  Psychiatric/Behavioral: Negative for depression   Past Medical History  Diagnosis Date  . Coronary artery disease   . Hypertension   . High cholesterol   . Diabetes mellitus   . LBBB (left bundle branch block)   . Paroxysmal atrial fibrillation   . Pneumonia 10-2012  . CHF (congestive heart failure)   . Renal disorder     kidney cancer  . Hepatitis many  yrs ago    no current liver problems per pt. reportedly Hepatitis C  . Disc     6 discs loswer back, back brace prn  . Anemia   . Cancer 2010    right nephrectomy  . 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 Complication of anesthesia     years ago went into a coma  . Sleep apnea     cpap setting of 4, or uses oxygen 4 liters per [place]  . Atrial fibrillation   . COPD (chronic obstructive pulmonary disease)   . S/P radiation therapy 12/16/2013-12/29/2013    1) C2-C4 spine / 30 Gy in 10 fractions / 2) T9-T11 spine / 30 Gy in 10 fractions    Past Surgical History  Procedure Laterality Date  . Nephrectomy Right 2010  . Esophagogastroduodenoscopy Left 10/02/2012    Dr. Paulita Fujita: normal   . Colonoscopy Left 10/03/2012    Dr. Michail Sermon: poorly prepped colon, lipomatous ileocecal valve. Path revealed focal acute inflammation with question of NSAID-induced injury.   . Back surgery  1960's and 1970's    lower x 2  . Left ring finger traumatic amputation      jumped out of truck, finger caught  . Coronary angioplasty with stent placement  yrs ago    x 5 or 6--DES TO LAD AND RAMUS  . Radiology with anesthesia N/A 11/29/2013    Procedure: RADIOLOGY WITH ANESTHESIA;  Surgeon: Consuella Lose, MD;  Location: [facility];  Service: Radiology;  Laterality: N/A;  . Posterior lumbar  fusion 4 level N/A 11/30/2013    Procedure: THORACIC TEN LAMINECTOMY DECOMPRESSION WITH THORACIC EIGHT-THORACIC TWELVE INSTRUMENTATION;  Surgeon: Consuella Lose, MD;  Location: [facility];  Service: Neurosurgery;  Laterality: N/A;   Social History:   reports that he has been passively smoking.  He has never used smokeless tobacco. He reports that he does not drink alcohol or use illicit drugs.  Family History  Problem Relation Age of Onset  . Hyperlipidemia Mother   . Diabetes Mother   . Heart attack Father   . Colon cancer Neg Hx   . Crohn's disease Neg Hx   . Ulcerative colitis Neg Hx     Medications: Patient's  Medications  New Prescriptions   No medications on file  Previous Medications   AMIODARONE (PACERONE) 200 MG TABLET    Take 200 mg by mouth every morning. For a-fib   COMBIVENT RESPIMAT 20-100 MCG/ACT AERS RESPIMAT    Inhale 1 puff into the lungs every 6 (six) hours as needed for shortness of breath.    DEXAMETHASONE (DECADRON) 4 MG TABLET    Take 4 mg by mouth 2 (two) times daily with a meal.   DILTIAZEM (TIAZAC) 360 MG 24 HR CAPSULE    Take 360 mg by mouth every morning.    FUROSEMIDE (LASIX) 40 MG TABLET    Take 40 mg by mouth every morning.    INSULIN ASPART (NOVOLOG) 100 UNIT/ML INJECTION    Inject 0-20 Units into the skin 3 (three) times daily with meals.   INSULIN ASPART (NOVOLOG) 100 UNIT/ML INJECTION    Inject 0-5 Units into the skin at bedtime.   INSULIN DETEMIR (LEVEMIR FLEXTOUCH) 100 UNIT/ML PEN    Inject 40 Units into the skin daily at 10 pm.   METOPROLOL (LOPRESSOR) 50 MG TABLET    Take 50 mg by mouth 2 (two) times daily.    NITROGLYCERIN (NITRODUR - DOSED IN MG/24 HR) 0.2 MG/HR PATCH    Place 0.2 mg onto the skin daily.   NITROGLYCERIN (NITROSTAT) 0.4 MG SL TABLET    Place 0.4 mg under the tongue every 5 (five) minutes as needed for chest pain.   PANTOPRAZOLE (PROTONIX) 40 MG TABLET    Take 40 mg by mouth every morning.    TAMSULOSIN (FLOMAX) 0.4 MG CAPS CAPSULE    Take 0.4 mg by mouth at bedtime.   Modified Medications   Modified Medication Previous Medication   HYDROCODONE-ACETAMINOPHEN (NORCO) 10-325 MG PER TABLET HYDROcodone-acetaminophen (NORCO) 10-325 MG per tablet      Take 1 tablet by mouth every 6 (six) hours as needed.    Take 1 tablet by mouth every 6 (six) hours as needed.   METHADONE (DOLOPHINE) 10 MG TABLET methadone (DOLOPHINE) 10 MG tablet      Take one tablet by mouth three times daily as needed for pain    Take 10 mg by mouth every 12 (twelve) hours as needed (For pain.).   Discontinued Medications   No medications on file     Physical Exam: Filed Vitals:    02/11/14 1224  BP: 128/62  Pulse: 72  Temp: 97.7 F (36.5 C)  Resp: 18  SpO2: 96%    General- elderly male in no acute distress, overweight Head- atraumatic, normocephalic Throat- moist mucus membrane, normal oropharynx Cardiovascular- normal s1,s2, no murmurs Respiratory- bilateral clear to auscultation, no wheeze, no rhonchi, no crackles, no use of accessory muscles Abdomen- bowel sounds present, soft, non tender, has a foley catheter Musculoskeletal- able  to move all 4 extremities, no leg edema, uses wheelchair Neurological- alert to person only Skin- warm and dry, hyperpigmented area on neck and back, pressure ulcer stage 2 in buttock, has skin tear with bleeding in buttock area, multiple area with bruises Psychiatry- some confusion, pleasant to converse with  Labs reviewed: Basic Metabolic Panel:  Recent Labs  11/30/13 1658 12/01/13 0559  01/19/14 1633 02/02/14 1347 02/09/14 2219 02/10/14 0537  NA 137 138  < > 133* 138 141 141  K 4.8 4.9  < > 3.4* 3.9 4.2 3.8  CL 97 100  < > 90*  --  97 97  CO2 27 28  < > 34* 31* 31 33*  GLUCOSE 139* 162*  < > 246* 245* 93 118*  BUN 23 23  < > 15 21.6 23 23   CREATININE 1.27 1.17  < > 1.08 1.4* 1.19 1.18  CALCIUM 8.1* 8.4  < > 8.2* 8.5 8.4 7.9*  MG 2.1 2.4  --   --   --   --   --   PHOS 5.4* 4.9*  --   --   --   --   --   < > = values in this interval not displayed. Liver Function Tests:  Recent Labs  01/19/14 1633 02/02/14 1347 02/09/14 2219  AST 20 14 28   ALT 64* 55 42  ALKPHOS 78 77 86  BILITOT 1.2 0.55 0.5  PROT 5.5* 5.7* 6.0  ALBUMIN 2.1* 2.1* 2.0*   No results found for this basename: LIPASE, AMYLASE,  in the last 8760 hours No results found for this basename: AMMONIA,  in the last 8760 hours CBC:  Recent Labs  01/19/14 1633 02/02/14 1347 02/09/14 2219 02/10/14 0537  WBC 3.1* 9.7 8.2 7.4  NEUTROABS 2.4 8.9* 7.8*  --   HGB 11.3* 10.7* 12.0* 11.1*  HCT 33.7* 32.7* 36.2* 33.8*  MCV 78.0 80.1 80.6 81.4    PLT 114* 189 148* 159   Cardiac Enzymes:  Recent Labs  01/11/14 1447 02/09/14 2219  TROPONINI <0.30 <0.30   BNP: No components found with this basename: POCBNP,  CBG:  Recent Labs  02/10/14 0732 02/10/14 1146 02/10/14 1213  GLUCAP 134* 62* 77   Lab Results  Component Value Date   HGBA1C 7.5* 12/16/2013    Radiological Exams: Ct Head Wo Contrast  02/10/2014   CLINICAL DATA:  BACK PAIN  EXAM: CT HEAD WITHOUT CONTRAST  TECHNIQUE: Contiguous axial images were obtained from the base of the skull through the vertex without intravenous contrast.  COMPARISON:  09/24/2012  FINDINGS: Atherosclerotic and physiologic intracranial calcifications. 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 Stable small lacunar infarcts or prominent perivascular spaces in the right basal ganglia and thalamus. Diffuse parenchymal atrophy. Patchy areas of hypoattenuation in deep and periventricular white matter bilaterally. Negative for acute intracranial hemorrhage, mass lesion, acute infarction, midline shift, or mass-effect. Acute infarct may be inapparent on noncontrast CT. Ventricles and sulci symmetric. Bone windows demonstrate no focal lesion.  IMPRESSION: 1. Negative for bleed or other acute intracranial process. 2. Mild atrophy and nonspecific white matter changes as before. 3. 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   Electronically Signed   By: Arne Cleveland M.D.   On: 02/10/2014 00:06   Dg Chest Portable 1 View  02/09/2014   CLINICAL DATA:  BACK PAIN  EXAM: PORTABLE CHEST - 1 VIEW  COMPARISON:  01/19/2014  FINDINGS: New moderate diffuse interstitial and airspace  opacities, with confluent disease at at the right lung base. Stable cardiomegaly. Tortuous aorta. Thoracolumbar fixation hardware partially visualized. No effusion.  IMPRESSION: 1. New   asymmetric infiltrates or edema, right worse than left. 2. Stable cardiomegaly.   Electronically Signed    By: Arne Cleveland M.D.   On: 02/09/2014 23:11    Assessment/Plan  Physical deconditioning Will have him work with physical therapy and occupational therapy team to help with gait training and muscle strengthening exercises.fall precautions. Skin care. Encourage to be out of bed. Monitor for early signs of infection  afib Rate controlled. Continue amiodarone, lopressor and diltiazem  Copd Continue combivent and to use cpap at night  RCC  With mets to the spine. Continue decadron 4 mg bid, has been getting 4 mg daily here. With his pain not under control and his weakness, will have him on his home dose of 4 mg bid and can taper it off slowly if tolerated. Continue flomax to help with urination and has a foley continue foley care. Goals of care discussion and moving towards hospice for comfort care should be the goal with overall poor prognosis. Will need to arrange for a family meeting to discuss goals of care  Back pain From his chronic back issue and spine metastases. Continue methadone and prn norco for now with decadron  HTN Continue lasix, b blocker, CCB and imdur patch  DM Change him to his home regimen of lantus 50 u qhs and novolog 5 u tid with meals and at bedtime for cbg > 150, monitor cbg  gerd continue his current ppi  Stage 2 pressure ulcer Pressure ulcer prophylaxis, wound care, gel overlay mattress. Continue skin care for the skin tear  Family/ staff Communication: reviewed care plan with patient and nursing supervisor  short term rehabilitation   Labs/tests ordered- cbc with diff, cmp    Blanchie Serve, MD  [facility] [phone] (Monday-Friday 8 am - 5 pm) [phone] (afterhours)

## 2014-02-11 NOTE — Progress Notes (Signed)
Phone Note 02-09-14 Butch Penny, the patient's friend, and I, were able to make phone contact on 02-09-14.  She said she wanted to know "Is [name] getting worse or better?" and "how much time does he have?"  I explained to her that it is my assessment that he is getting worse, and that while it is hard to determine his life expectancy with any degree of certainty, I would guess that he has 6 months or less to live. For these reasons, as well as the reasons that we discussed at his last visit with me, I think he is an excellent hospice candidate. She agrees. She said that they have an appointment with a hospice worker in the near future. She said she will update me on how he is doing.  -----------------------------------  Eppie Gibson, MD

## 2014-02-11 NOTE — Telephone Encounter (Signed)
[facility] 

## 2014-02-15 LAB — CULTURE, BLOOD (ROUTINE X 2)
CULTURE: NO GROWTH
Culture: NO GROWTH

## 2014-02-18 ENCOUNTER — Non-Acute Institutional Stay (SKILLED_NURSING_FACILITY): Payer: Medicare Other | Admitting: Internal Medicine

## 2014-02-18 ENCOUNTER — Other Ambulatory Visit: Payer: Self-pay | Admitting: Internal Medicine

## 2014-02-18 DIAGNOSIS — M899 Disorder of bone, unspecified: Secondary | ICD-10-CM

## 2014-02-18 DIAGNOSIS — M949 Disorder of cartilage, unspecified: Secondary | ICD-10-CM

## 2014-02-18 DIAGNOSIS — M898X9 Other specified disorders of bone, unspecified site: Principal | ICD-10-CM

## 2014-02-18 DIAGNOSIS — G893 Neoplasm related pain (acute) (chronic): Secondary | ICD-10-CM

## 2014-02-18 LAB — CBC WITH DIFFERENTIAL/PLATELET
HCT: 33.8 % — ABNORMAL LOW (ref 39.0–52.0)
Hemoglobin: 11.4 g/dL — ABNORMAL LOW (ref 13.0–17.0)
Lymphocytes Relative: 5 % — ABNORMAL LOW (ref 12–46)
Lymphs Abs: 0.4 10*3/uL — ABNORMAL LOW (ref 0.7–4.0)
MCH: 27.5 pg (ref 26.0–34.0)
MCHC: 33.7 g/dL (ref 30.0–36.0)
MCV: 81.5 fL (ref 78.0–100.0)
Monocytes Absolute: 0.3 10*3/uL (ref 0.1–1.0)
Monocytes Relative: 4 % (ref 3–12)
Neutro Abs: 7.8 10*3/uL — ABNORMAL HIGH (ref 1.7–7.7)
Neutrophils Relative %: 91 % — ABNORMAL HIGH (ref 43–77)
Platelets: 225 10*3/uL (ref 150–400)
RBC: 4.15 MIL/uL — ABNORMAL LOW (ref 4.22–5.81)
RDW: 20.8 % — ABNORMAL HIGH (ref 11.5–15.5)
WBC: 8.6 10*3/uL (ref 4.0–10.5)

## 2014-02-24 ENCOUNTER — Non-Acute Institutional Stay (SKILLED_NURSING_FACILITY): Payer: Medicare Other | Admitting: Internal Medicine

## 2014-02-24 DIAGNOSIS — R0989 Other specified symptoms and signs involving the circulatory and respiratory systems: Secondary | ICD-10-CM

## 2014-02-24 DIAGNOSIS — R0603 Acute respiratory distress: Secondary | ICD-10-CM | POA: Insufficient documentation

## 2014-02-24 DIAGNOSIS — R5381 Other malaise: Secondary | ICD-10-CM | POA: Insufficient documentation

## 2014-02-24 DIAGNOSIS — IMO0001 Reserved for inherently not codable concepts without codable children: Secondary | ICD-10-CM

## 2014-02-24 DIAGNOSIS — E119 Type 2 diabetes mellitus without complications: Secondary | ICD-10-CM

## 2014-02-24 DIAGNOSIS — R0609 Other forms of dyspnea: Secondary | ICD-10-CM

## 2014-02-24 DIAGNOSIS — C649 Malignant neoplasm of unspecified kidney, except renal pelvis: Secondary | ICD-10-CM

## 2014-02-24 DIAGNOSIS — Z794 Long term (current) use of insulin: Secondary | ICD-10-CM

## 2014-02-25 ENCOUNTER — Encounter: Payer: Self-pay | Admitting: Radiation Therapy

## 2014-02-25 NOTE — Progress Notes (Signed)
Butch Penny called to let me know that [name] passed away yesterday, 2014/03/24. She is thankful for the care shown and the efforts made to help [facility].

## 2014-03-05 NOTE — Progress Notes (Signed)
Patient ID: [name], male   DOB: 1941/05/12, 73 y.o.   MRN: [id number]    Facility: [facility]  Chief Complaint  Patient presents with  . Acute Visit    pain not controlled   Allergies  Allergen Reactions  . Other Other (See Comments)    Anesthesia (gas) medication, Reaction: went into coma in the 1960's   HPI:   73 y/o male patient with stage IV RCC with spine metastases is seen today for pain in his back and joints which is not under control with current pain regimen. The pain is constant, movement makes it worse. Also has pressure ulcer   Review of Systems:  Constitutional: Negative for fever, chills, diaphoresis.  HENT: Negative for congestion Respiratory: Negative for cough, shortness of breath and wheezing.   Cardiovascular: Negative for chest pain, palpitations Gastrointestinal: Negative for heartburn, nausea, vomiting, abdominal pain  Genitourinary: has a foley Musculoskeletal: positive for back pain Neurological: Negative for dizziness, theadaches.  Psychiatric/Behavioral: Negative for depression  Past Medical History  Diagnosis Date  . Coronary artery disease   . Hypertension   . High cholesterol   . Diabetes mellitus   . LBBB (left bundle branch block)   . Paroxysmal atrial fibrillation   . Pneumonia 10-2012  . CHF (congestive heart failure)   . Renal disorder     kidney cancer  . Hepatitis many yrs ago    no current liver problems per pt. reportedly Hepatitis C  . Disc     6 discs loswer back, back brace prn  . Anemia   . Cancer 2010    right nephrectomy  . 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 Complication of anesthesia     years ago went into a coma  . Sleep apnea     cpap setting of 4, or uses oxygen 4 liters per [place]  . Atrial fibrillation   . COPD (chronic obstructive pulmonary disease)   . S/P radiation therapy 12/16/2013-12/29/2013    1) C2-C4 spine / 30 Gy in 10 fractions / 2) T9-T11 spine / 30 Gy in 10 fractions     Medication reviewed. See [facility]  Physical exam BP 116/78  Pulse 82  Temp(Src) 97.1 F (36.2 C)  Resp 18  SpO2 94%  General- elderly male overweight Head- atraumatic, normocephalic Throat- moist mucus membrane, normal oropharynx Cardiovascular- normal s1,s2, no murmurs Respiratory- bilateral clear to auscultation, no wheeze, no rhonchi, no crackles, no use of accessory muscles Abdomen- bowel sounds present, soft, non tender, has a foley catheter Musculoskeletal- able to move all 4 extremities, generalized pain Neurological- alert to person and place  Assessment/plan  Spine mets Causing the bone pain. Will change his methadone to 10 mg tid from prn dosing and reassess. Hold for sedation. Continue decadron and prn norco and reassess. Has overall poor prognosis

## 2014-03-10 NOTE — Progress Notes (Signed)
Patient ID: [name], male   DOB: 04-26-1941, 73 y.o.   MRN: [id number]    Facility: [facility]  Chief Complaint  Patient presents with  . Acute Visit    dyspnea, decline   Allergies  Allergen Reactions  . Other Other (See Comments)    Anesthesia (gas) medication, Reaction: went into coma in the 1960's   HPI:   73 y/o male patient is here for rehabilitation. He has history of stage IV RCC with spine metastases on decadron and pain medications. He also has hx of afib, ckd, CAD, HTN. He has been rapidly declining with increased somnolence, worsening pain and dyspnea. He is on continuous oxygen and o2 saturation is fine but using his accessory muscles to breathe. He has been congested in his chest as per nursing.  He is seen in his room. He opens his eyes to commands but does not follow them. He appears somnolent. Unable to obtain any ROS today. Pt not taking any thing po at present. Has a foley in place  Review of Systems:  Unable to obtain  Past Medical History  Diagnosis Date  . Coronary artery disease   . Hypertension   . High cholesterol   . Diabetes mellitus   . LBBB (left bundle branch block)   . Paroxysmal atrial fibrillation   . Pneumonia 10-2012  . CHF (congestive heart failure)   . Renal disorder     kidney cancer  . Hepatitis many yrs ago    no current liver problems per pt. reportedly Hepatitis C  . Disc     6 discs loswer back, back brace prn  . Anemia   . Cancer 2010    right nephrectomy  . 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 Complication of anesthesia     years ago went into a coma  . Sleep apnea     cpap setting of 4, or uses oxygen 4 liters per [place]  . Atrial fibrillation   . COPD (chronic obstructive pulmonary disease)   . S/P radiation therapy 12/16/2013-12/29/2013    1) C2-C4 spine / 30 Gy in 10 fractions / 2) T9-T11 spine / 30 Gy in 10 fractions    Medication reviewed. See [facility]  Physical exam BP 98/69  Pulse 96   Temp(Src) 98 F (36.7 C)  Resp 22  SpO2 93%  General- elderly male in respiratory distress, overweight Head- atraumatic, normocephalic Throat- dry mucus membrane Cardiovascular- normal s1,s2, no murmurs Respiratory- bilateral scattered rhonchi with poor air entry Abdomen- bowel sounds present, soft, non tender, foley in place Neurological- somnolent, opening eyes to command Skin- warm and dry, stage 2 pressure ulcer Psychiatry- unable to assess  Assessment/plan  Respiratory distress With his overall decline and poor prognosis, will focus on comfort care for him. Will get hospice care involved. Will continue o2 by nasal canula and have him on roxanol 20 mg/ml 0.25 ml every 8 hours as needed for now and titrate it further as needed.  Physical deconditioning Hospice referral to help with comfort care. Will add ativan 0.5 mg po every 8 hour as needed for anxiety and roxanol 0.25 ml q8h prn for pain and respiratory distress.   RCC   has a foley continue foley care. D/c flomax. Hospice consult. D/c norco and methadone  DM Not alert enough to take po feeds. D/c all insulin orders and have him on SSI 5 u for cbg > 200 bid  gerd D/c his PPI

## 2014-03-10 DEATH — deceased

## 2014-04-01 IMAGING — CT CT ABD-PELV W/ CM
2 of 4 series · 16 of 46 positions shown, 18 images · IV contrast (Omnipaque 300)
Comparison: 04/27/2007 CT

CLINICAL DATA: Right-sided abdominal pain

CT ABDOMEN AND PELVIS WITH CONTRAST
TECHNIQUE: Multidetector CT imaging of the abdomen and pelvis was
performed following the standard protocol during bolus
administration of intravenous contrast.
Contrast: 100mL OMNIPAQUE IOHEXOL 300 MG/ML  SOLN

[Series 2: abd_pel_with 5.0 b40f · axial · 0.89mm/px · z∈[-649,-199]mm · 13 of 102 slices shown, 15 images]
[im 6/102  soft-tissue]
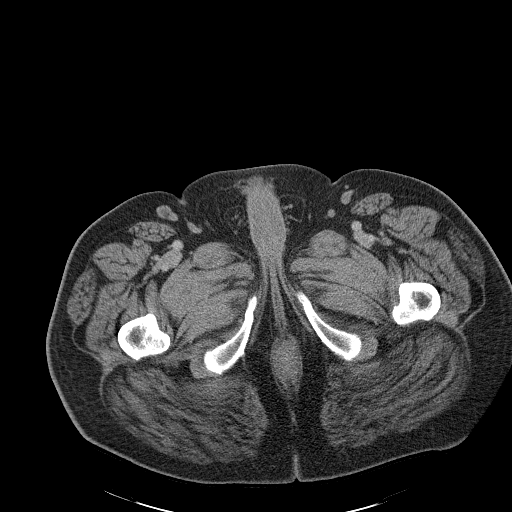
[im 6/102  bone]
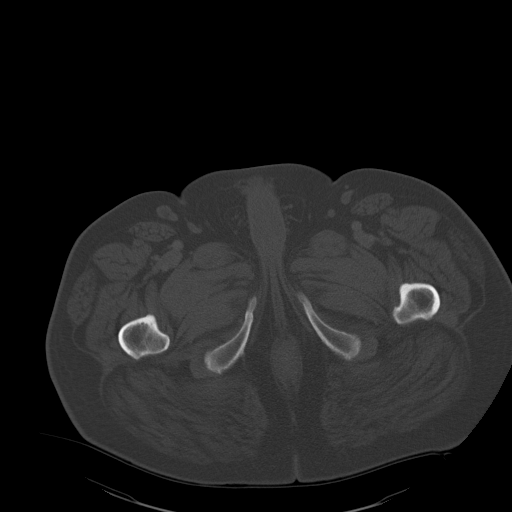
[im 16/102  soft-tissue]
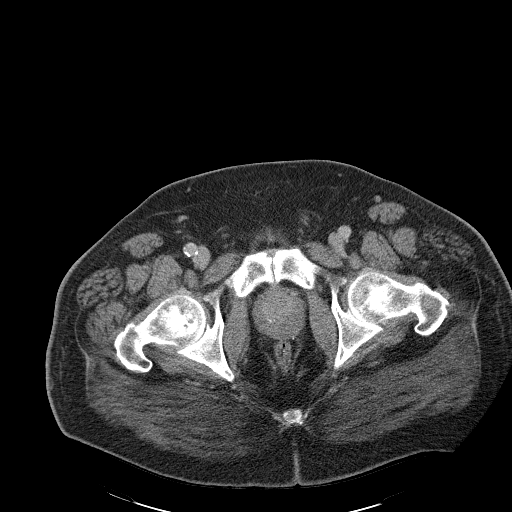
[im 22/102  soft-tissue]
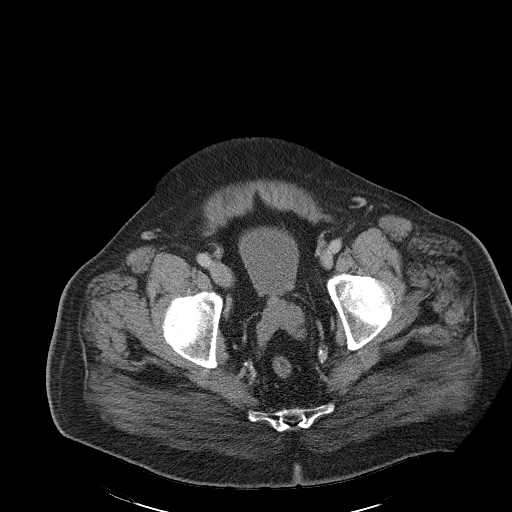
[im 27/102  soft-tissue]
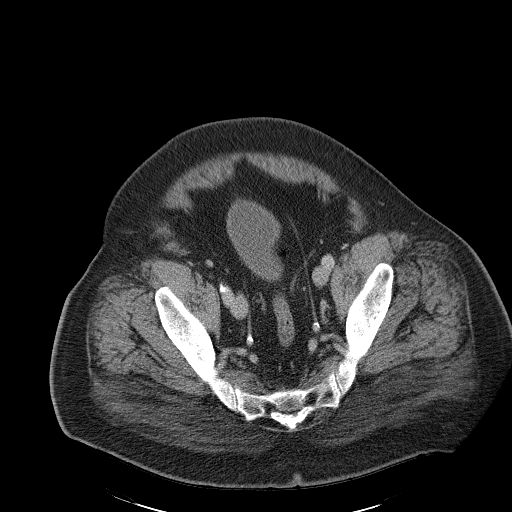
[im 38/102  soft-tissue]
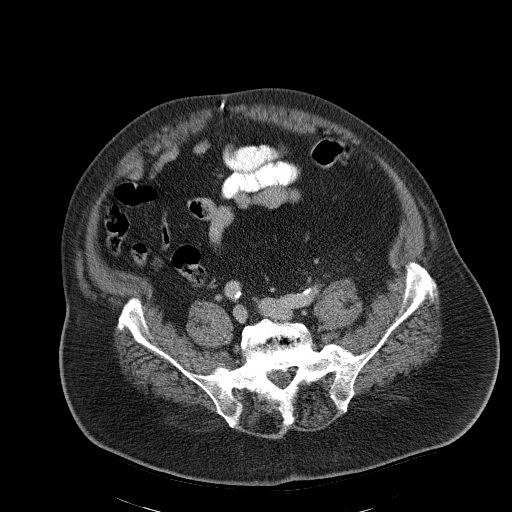
[im 43/102  soft-tissue]
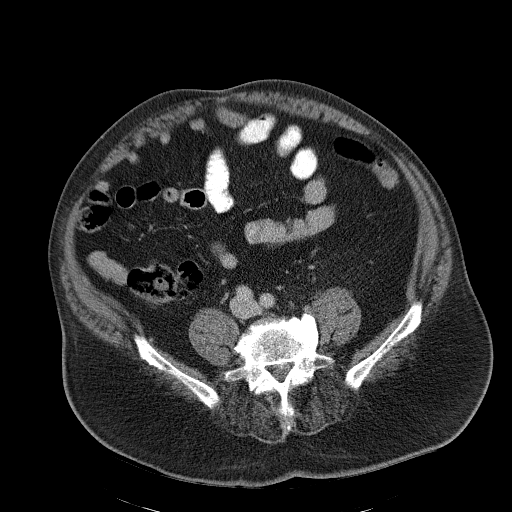
[im 54/102  soft-tissue]
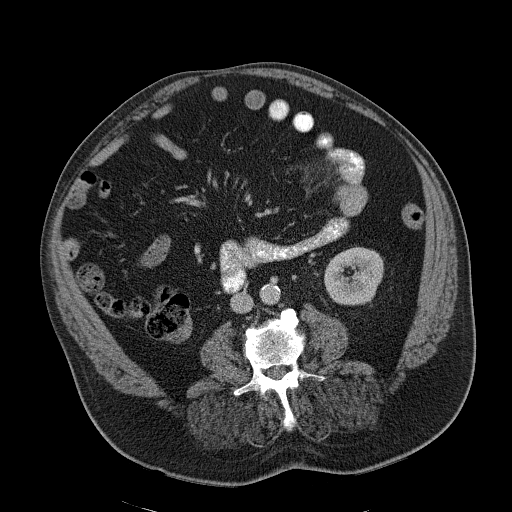
[im 59/102  soft-tissue]
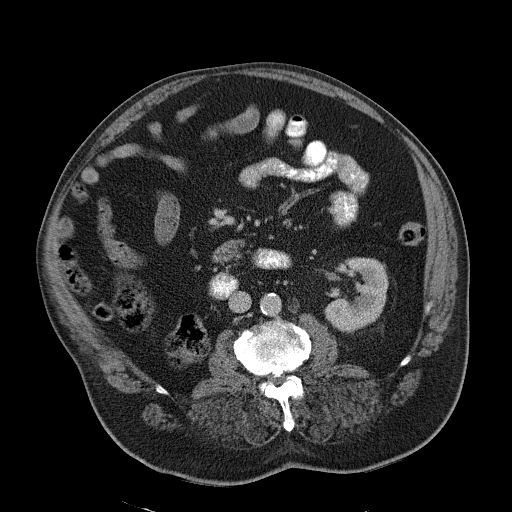
[im 64/102  soft-tissue]
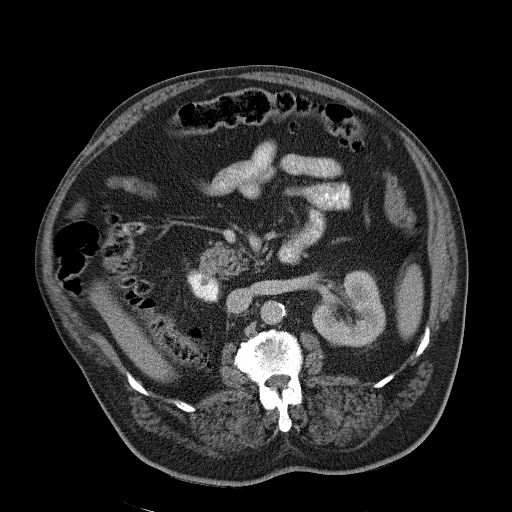
[im 64/102  bone]
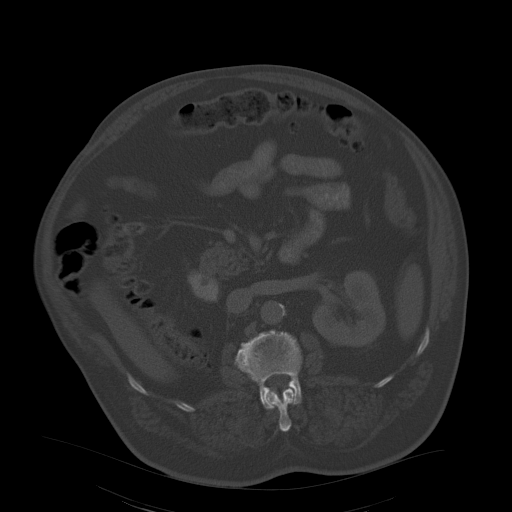
[im 75/102  soft-tissue]
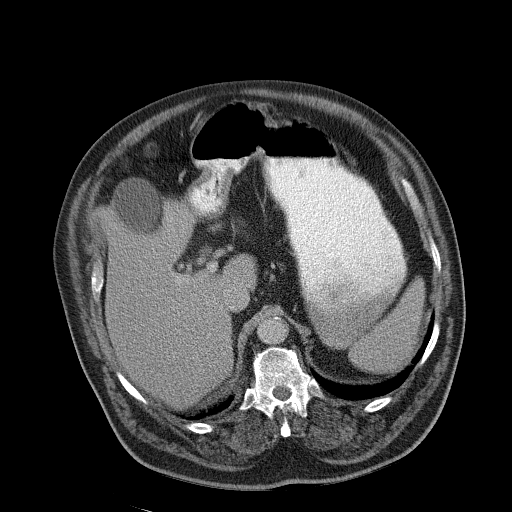
[im 80/102  soft-tissue]
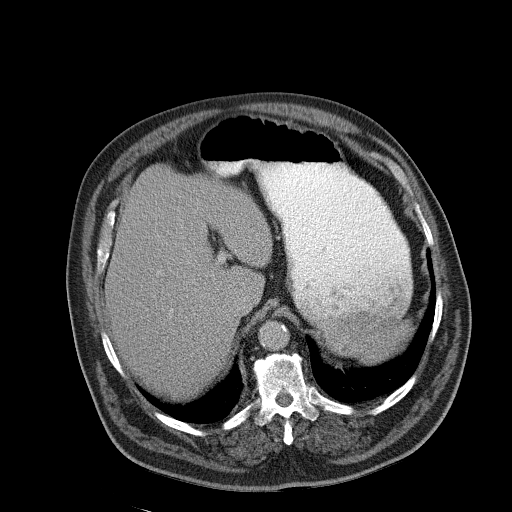
[im 86/102  soft-tissue]
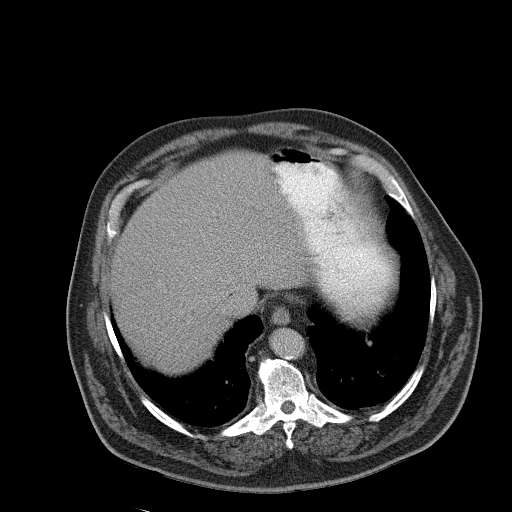
[im 96/102  soft-tissue]
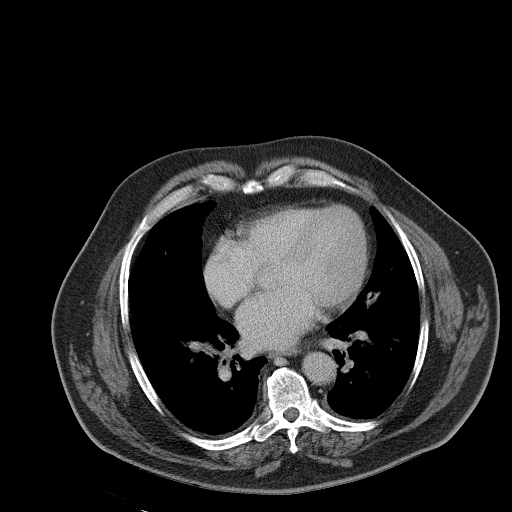

[Series 4: abd_pel_with 3.0 spo cor · coronal · 0.83mm/px · 3 of 130 slices shown]
[im 44/130  soft-tissue]
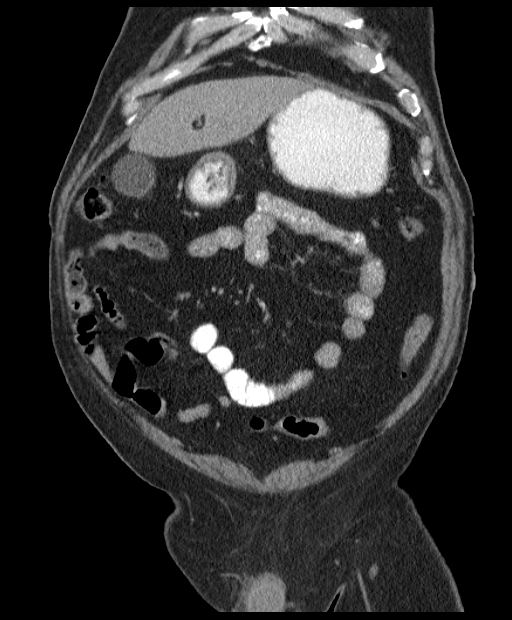
[im 58/130  soft-tissue]
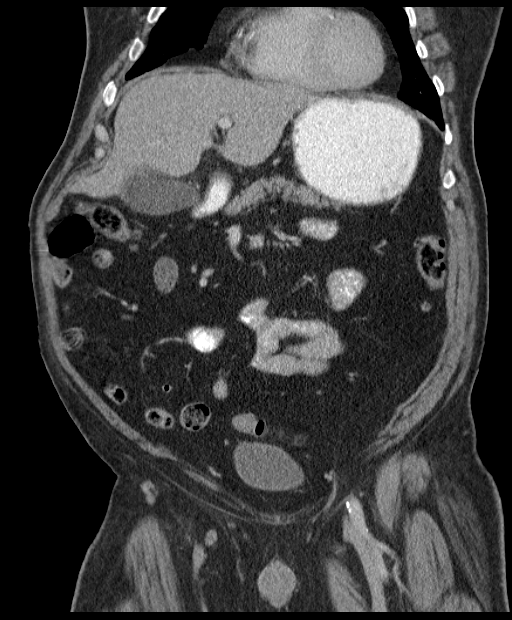
[im 72/130  soft-tissue]
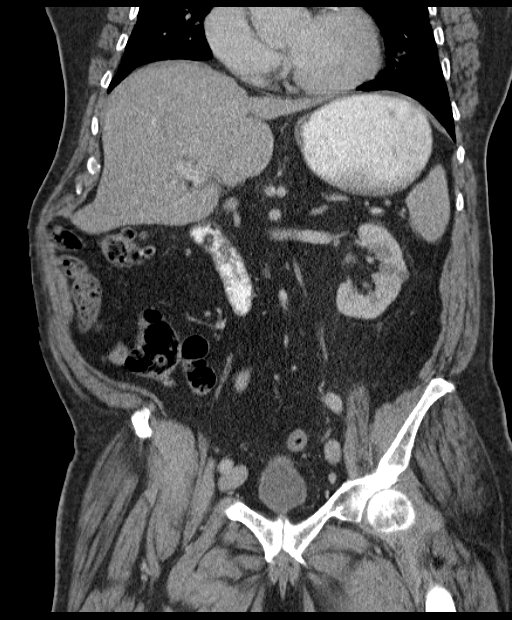

[16 of 46 positions shown; findings below may reference images not displayed]

FINDINGS: Coronary artery calcification.  Heart size upper normal.
Trace pleural effusions.  No pericardial effusion.

Diffuse low attenuation of the liver is nonspecific post contrast
however suggests fatty infiltration.  Unremarkable spleen,
pancreas, and adrenal glands.  Gallstones.  No biliary ductal
dilatation.  No pericholecystic fluid or gallbladder wall
thickening.

Surgically absent right kidney.  No soft tissue within the surgical
bed.  There is a nonspecific 9 mm hypodensity arising laterally
from the interpolar left kidney.  No hydronephrosis or hydroureter.

No bowel obstruction.  Colonic diverticulosis.  No CT evidence for
colitis or diverticulitis.  Normal appendix.  No free
intraperitoneal air or fluid.  No lymphadenopathy.

There is scattered atherosclerotic calcification of the aorta and
its branches. No aneurysmal dilatation.

Nearly decompressed bladder.  Nonspecific prostatic calcifications.
No pelvic lymphadenopathy.

Bilateral SI joint ankylosis and lumbar degenerative changes. No
acute osseous finding.  Multilevel lumbar disc osteophyte complexes
resulting moderate to severe central canal narrowing.
IMPRESSION: Status post right nephrectomy.  No CT evidence for local recurrence
or abdominopelvic metastases.

Hepatic steatosis.

Cholelithiasis.

Colonic diverticulosis.

Scattered atherosclerotic disease.

Multilevel degenerative changes as above.

## 2014-04-06 ENCOUNTER — Ambulatory Visit: Payer: Medicare Other | Admitting: Oncology

## 2014-09-18 IMAGING — CR DG CHEST 1V
1 series · 1 of 1 positions shown · non-contrast
Comparison: Portable chest x-ray 08/21/2012, 06/11/2011.  Two-view
chest x-ray 02/08/2011.

CLINICAL DATA: Aspiration.  Patient undergoing CPAP.

CHEST - 1 VIEW

[view not recorded]
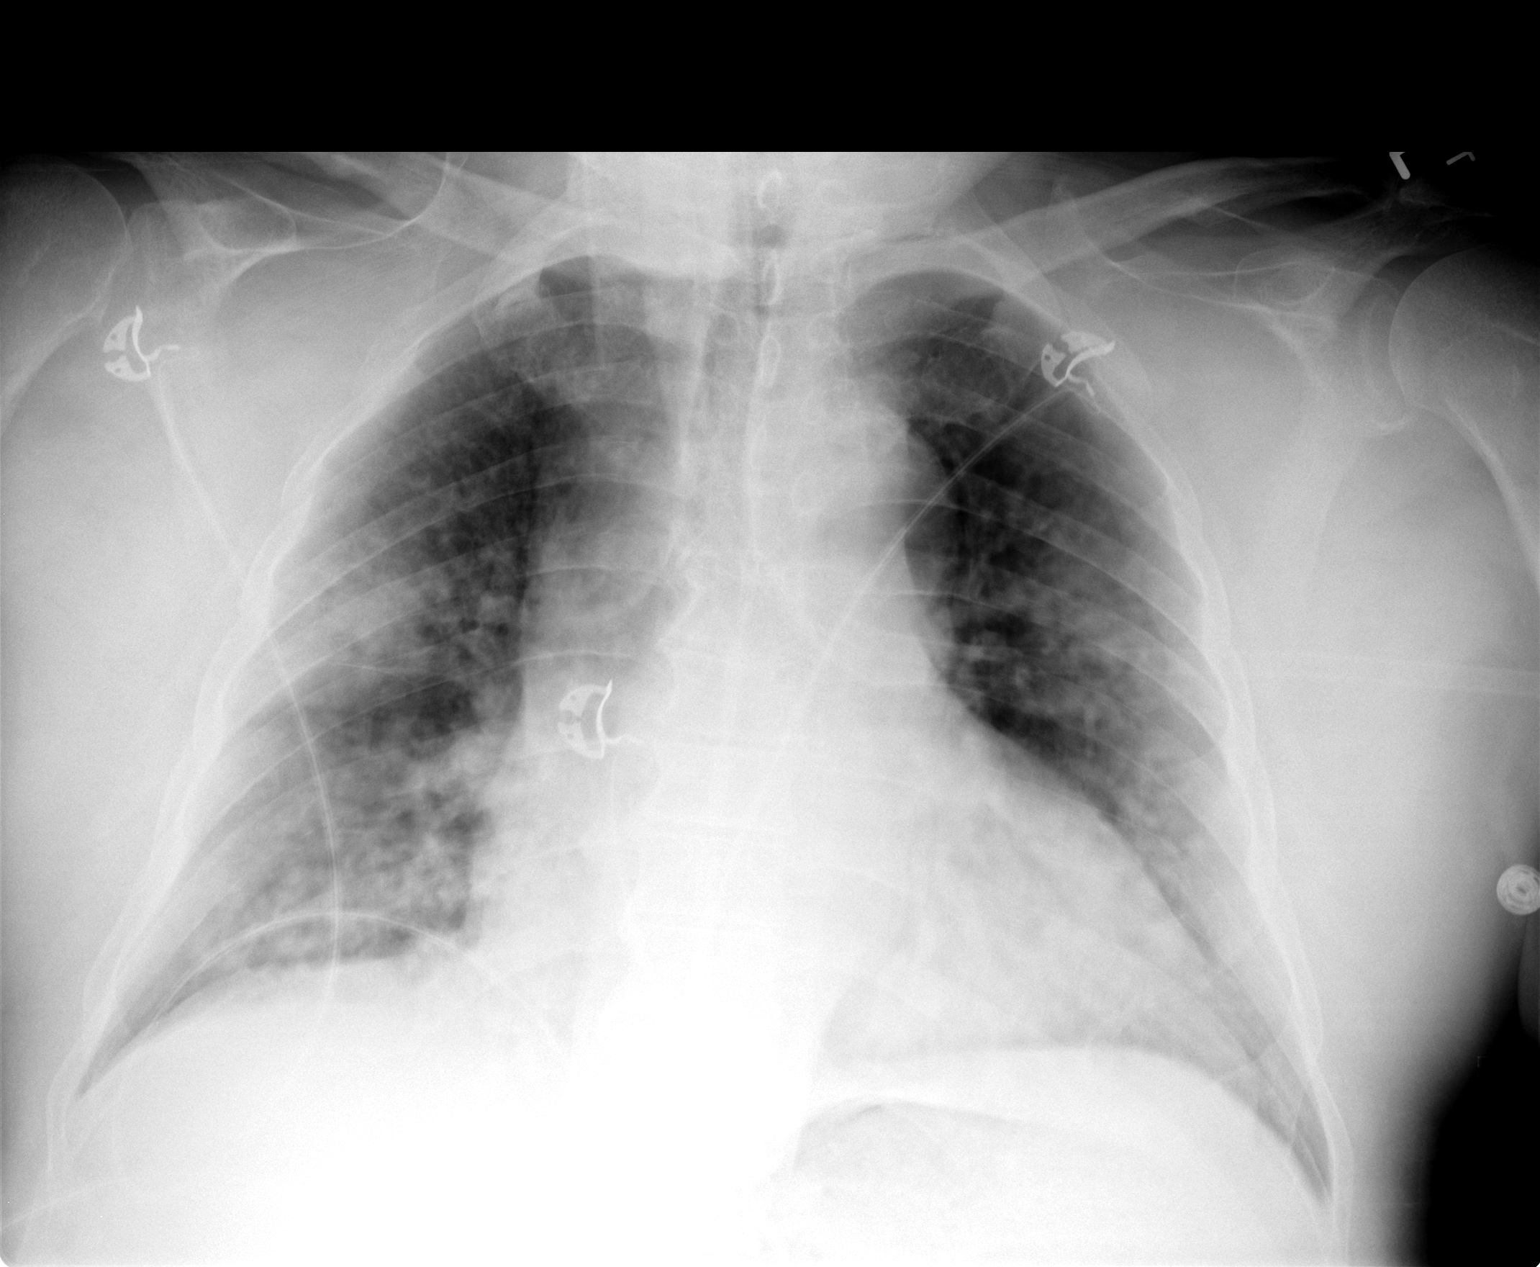

[1 of 1 positions shown; findings below may reference images not displayed]

FINDINGS: Suboptimal inspiration accounts for crowded
bronchovascular markings, especially in the lung bases, and
accentuates the cardiac silhouette.  Taking this into account,
cardiac silhouette moderately enlarged but stable.  Interval
development of airspace consolidation in the right upper lobe and
pulmonary venous hypertension since the examination 5 days ago.  No
overt pulmonary edema.
IMPRESSION: Right upper lobe pneumonia.  Pulmonary venous hypertension without
overt edema.  Stable cardiomegaly.

## 2014-09-19 IMAGING — CR DG CHEST 1V PORT
1 series · 1 of 1 positions shown · non-contrast
Comparison: Portable chest x-ray of 08/26/2012

CLINICAL DATA: To torus failure

PORTABLE CHEST - 1 VIEW

[view not recorded]
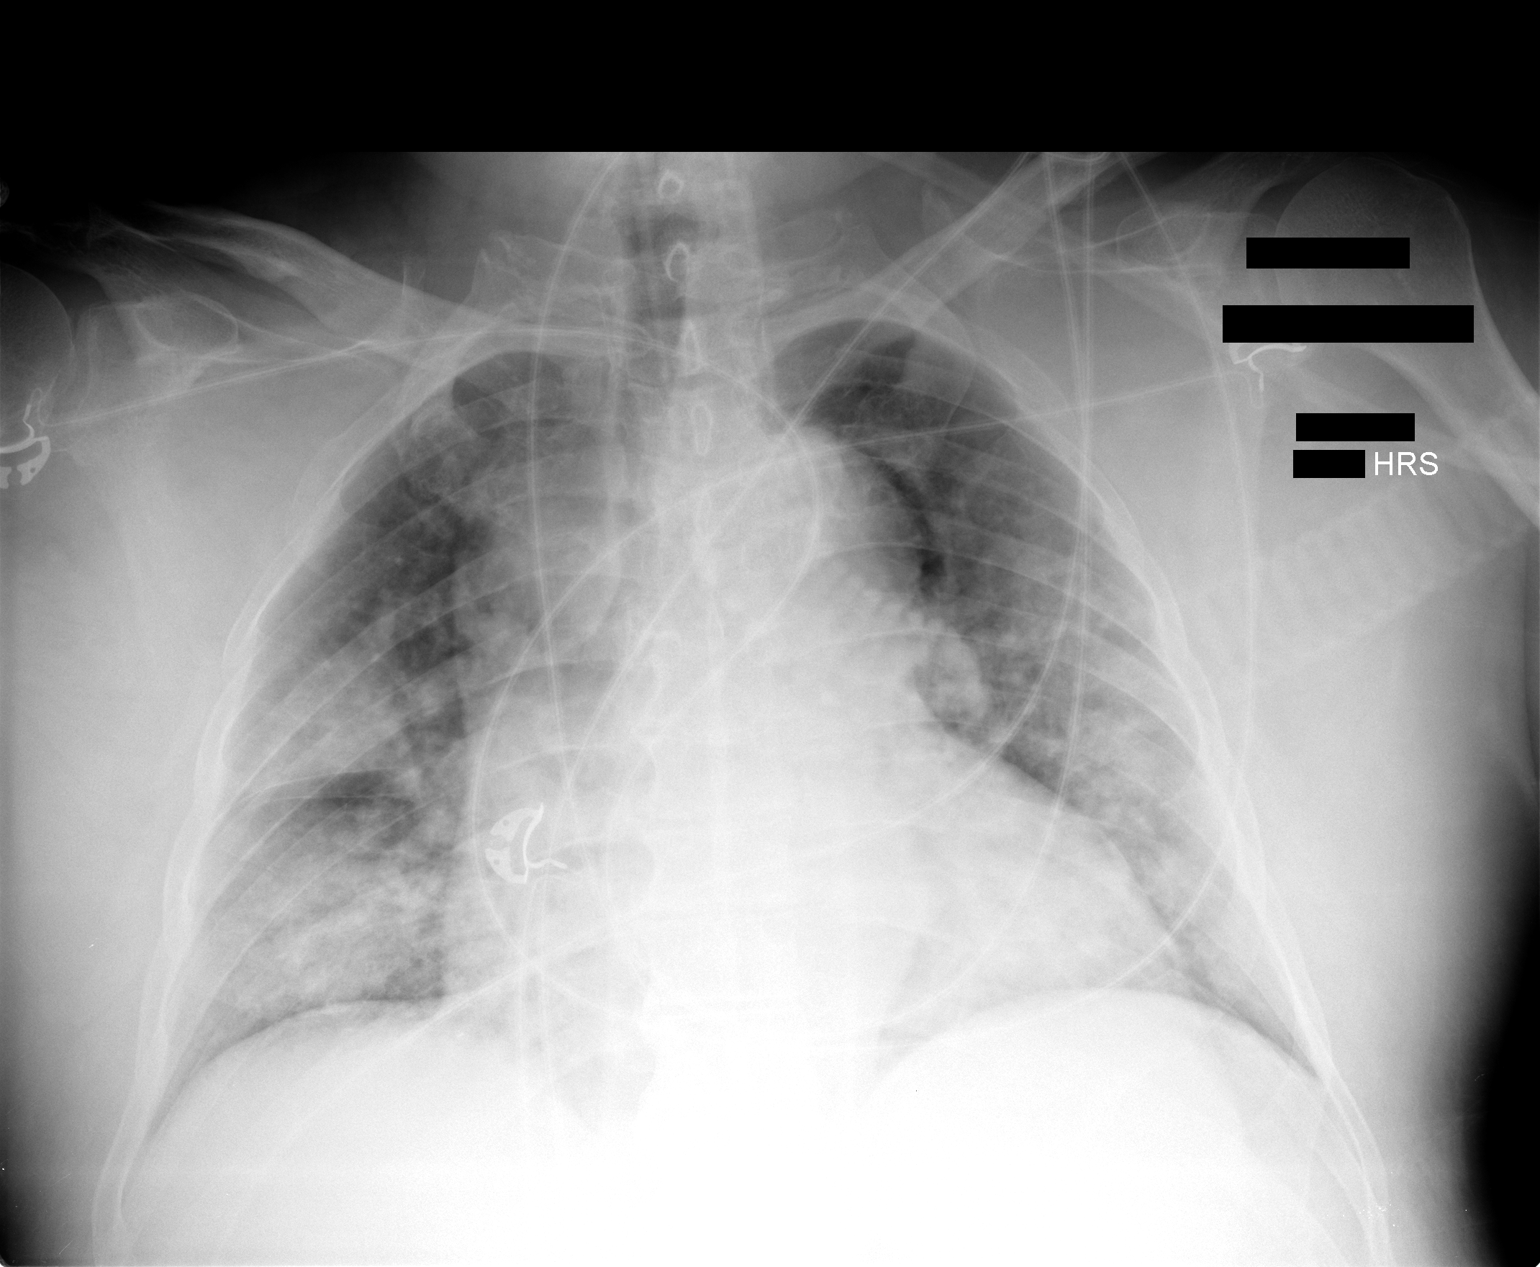

[1 of 1 positions shown; findings below may reference images not displayed]

FINDINGS: There is more patchy opacities bilaterally.  Although
this could represent asymmetrical edema, pneumonia is a definite
consideration.  Cardiomegaly is stable.  No bony abnormality is
seen.
IMPRESSION: Some worsening of patchy airspace disease.  Possible asymmetrical
edema but consider pneumonia.

## 2014-09-27 IMAGING — CR DG CHEST 2V
2 series · 2 of 2 positions shown · non-contrast
Comparison: 09/03/2012

CLINICAL DATA: Assess infiltrates

CHEST - 2 VIEW

[view not recorded (1 of 2)]
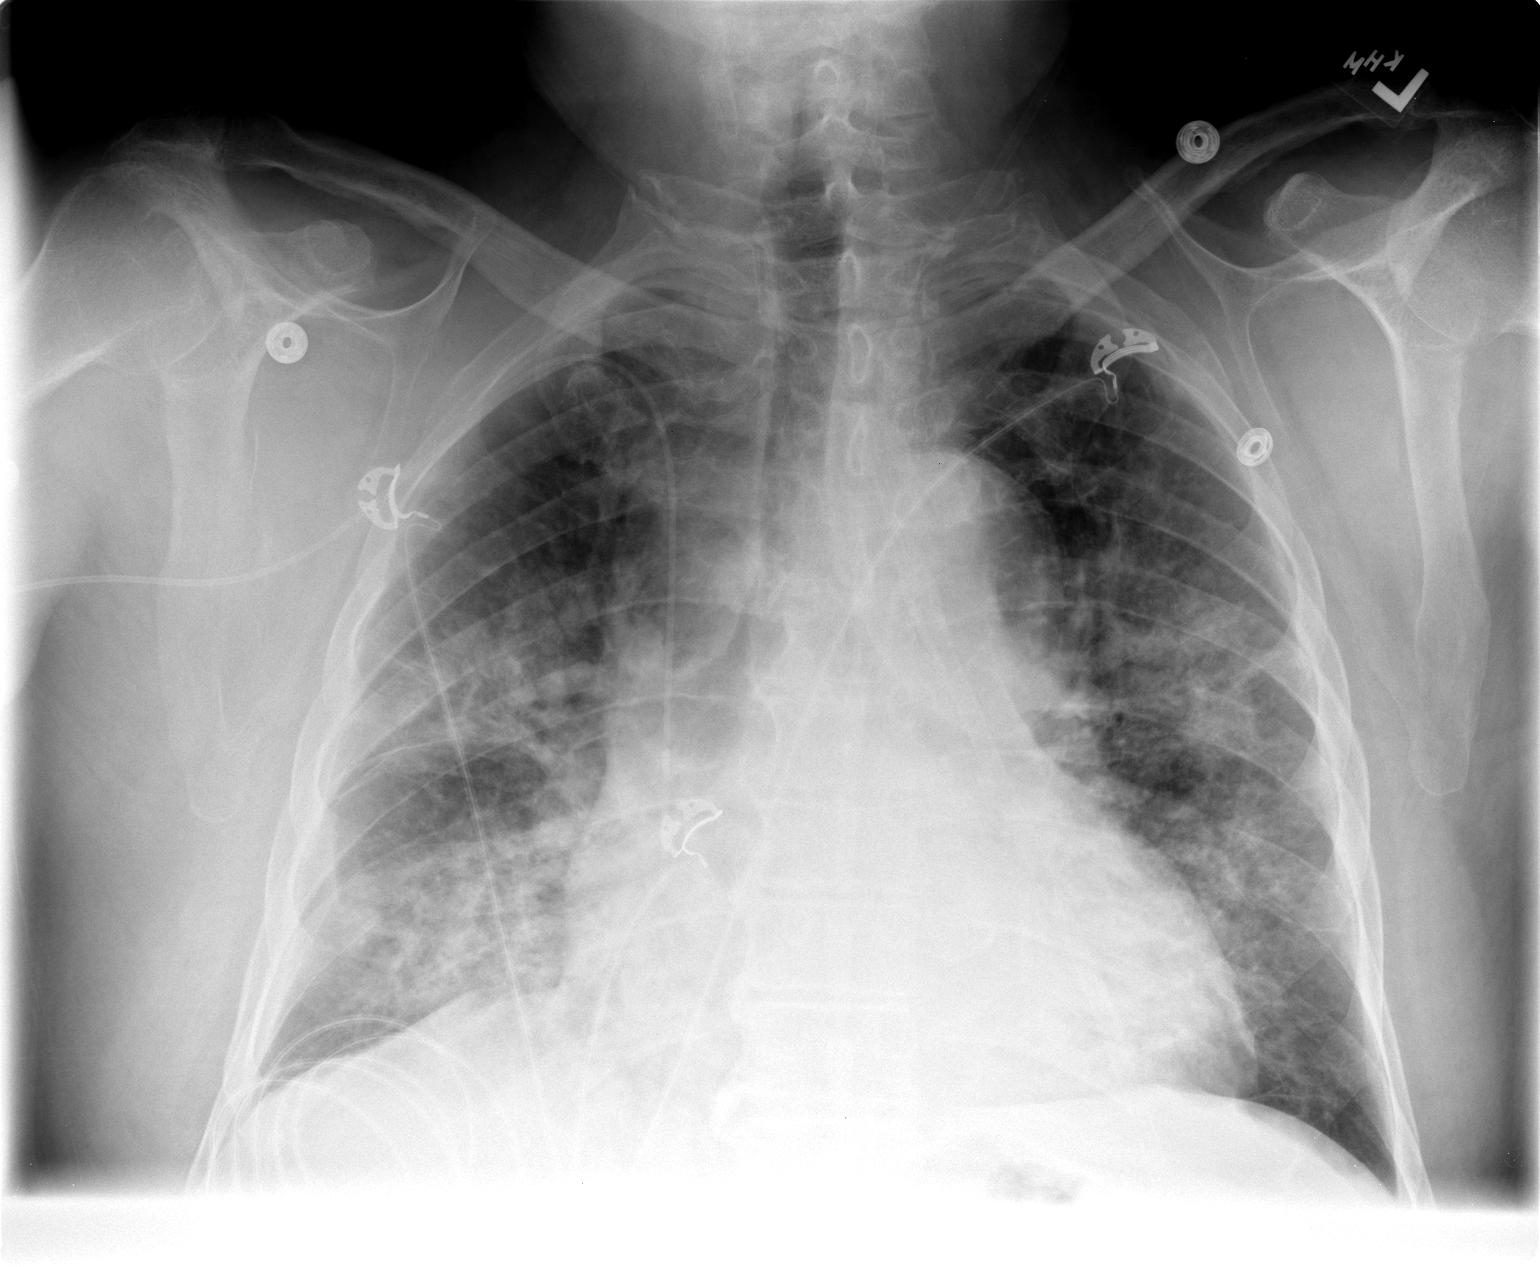

[view not recorded (2 of 2)]
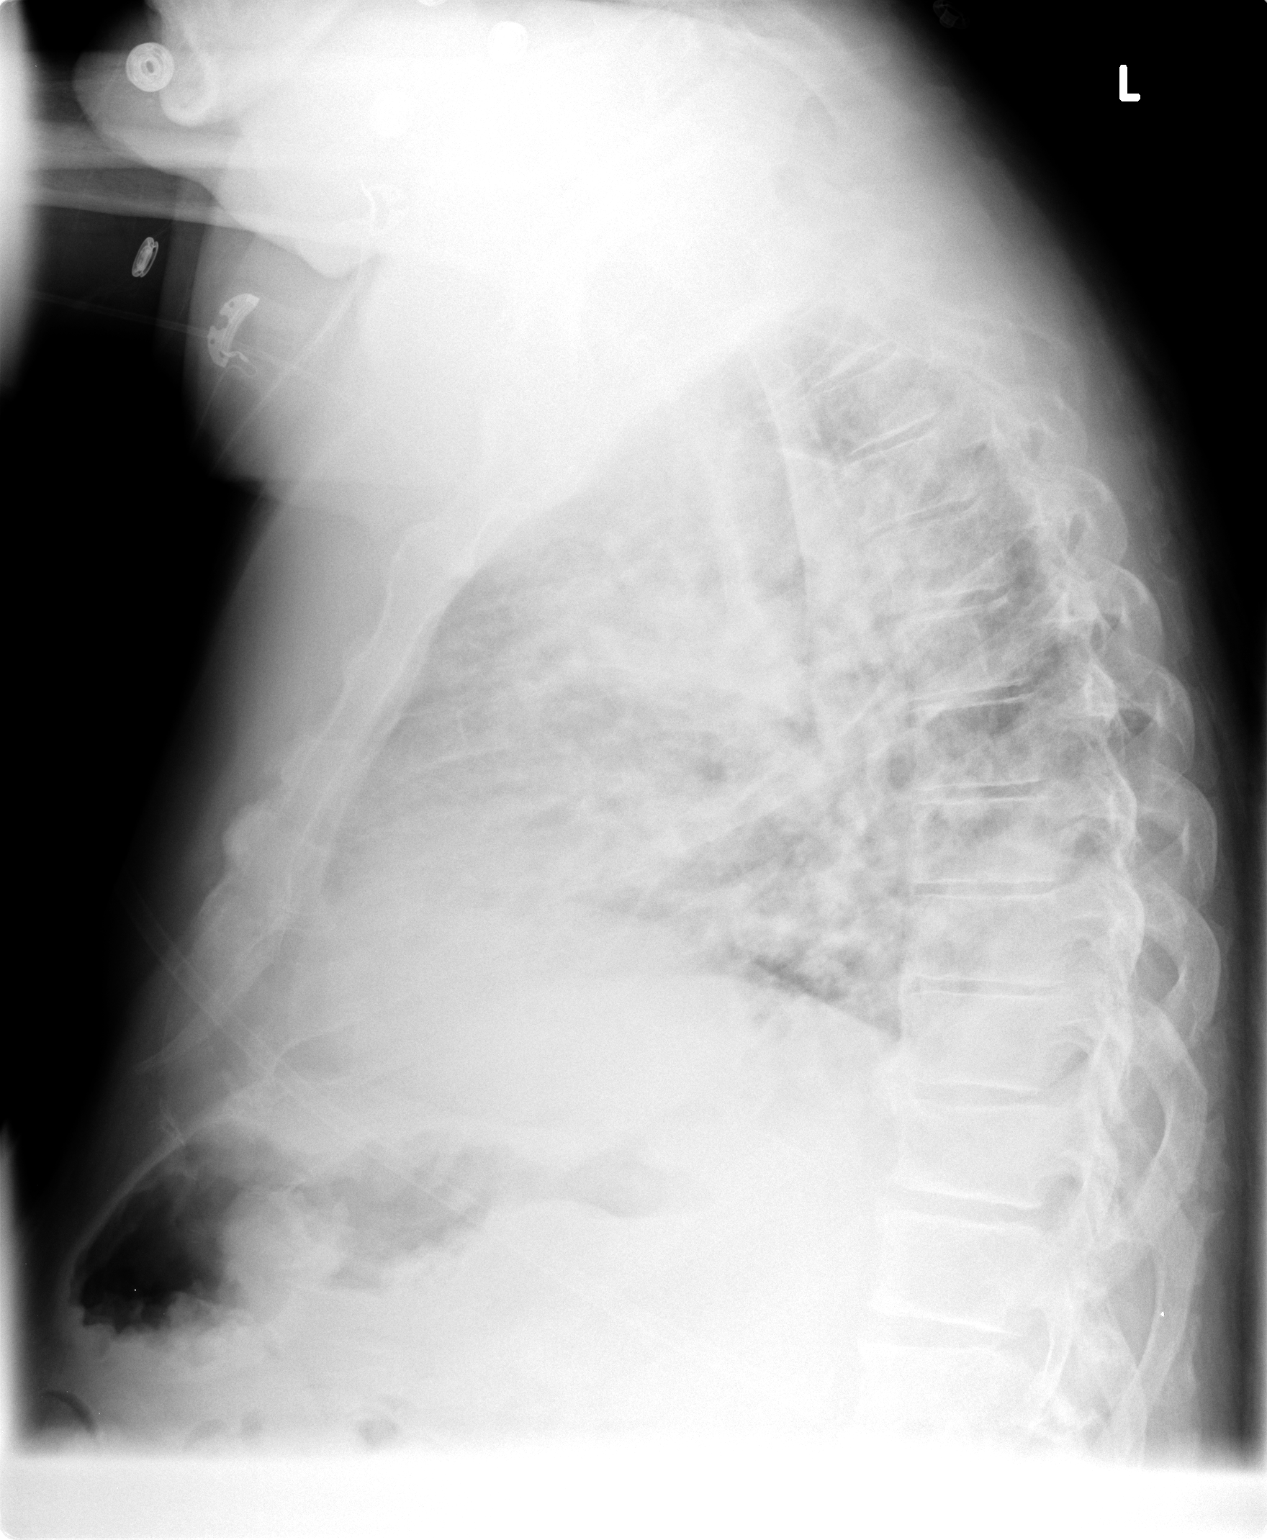

[2 of 2 positions shown; findings below may reference images not displayed]

FINDINGS: Right-sided PICC line is again seen.  Bilateral
infiltrates are again noted and stable.  Cardiac shadow is stable.
IMPRESSION: Stable bilateral infiltrative change

## 2018-06-05 NOTE — Progress Notes (Signed)
This encounter was created in error - please disregard.
